# Patient Record
Sex: Female | Born: 1945
Health system: Southern US, Community
[De-identification: ages and names within clinical notes are randomized; demographics above are authoritative.]

## PROBLEM LIST (undated history)

## (undated) DIAGNOSIS — R011 Cardiac murmur, unspecified: Secondary | ICD-10-CM

## (undated) DIAGNOSIS — H269 Unspecified cataract: Secondary | ICD-10-CM

## (undated) DIAGNOSIS — R7303 Prediabetes: Secondary | ICD-10-CM

## (undated) DIAGNOSIS — I499 Cardiac arrhythmia, unspecified: Secondary | ICD-10-CM

## (undated) DIAGNOSIS — M1990 Unspecified osteoarthritis, unspecified site: Secondary | ICD-10-CM

## (undated) DIAGNOSIS — R7301 Impaired fasting glucose: Secondary | ICD-10-CM

## (undated) DIAGNOSIS — K5730 Diverticulosis of large intestine without perforation or abscess without bleeding: Secondary | ICD-10-CM

## (undated) DIAGNOSIS — E785 Hyperlipidemia, unspecified: Secondary | ICD-10-CM

## (undated) DIAGNOSIS — K635 Polyp of colon: Secondary | ICD-10-CM

## (undated) HISTORY — PX: ABDOMINAL HYSTERECTOMY: SHX81

## (undated) HISTORY — PX: OTHER SURGICAL HISTORY: SHX169

## (undated) HISTORY — DX: Diverticulosis of large intestine without perforation or abscess without bleeding: K57.30

## (undated) HISTORY — PX: CATARACT EXTRACTION, BILATERAL: SHX1313

## (undated) HISTORY — DX: Impaired fasting glucose: R73.01

## (undated) HISTORY — PX: CHOLECYSTECTOMY, LAPAROSCOPIC: SHX56

## (undated) HISTORY — PX: CHOLECYSTECTOMY: SHX55

## (undated) HISTORY — DX: Unspecified cataract: H26.9

## (undated) HISTORY — DX: Hyperlipidemia, unspecified: E78.5

## (undated) HISTORY — DX: Polyp of colon: K63.5

## (undated) HISTORY — PX: EYE SURGERY: SHX253

---

## 1999-06-03 ENCOUNTER — Other Ambulatory Visit: Admission: RE | Admit: 1999-06-03 | Discharge: 1999-06-03 | Payer: Self-pay | Admitting: *Deleted

## 2000-07-04 ENCOUNTER — Other Ambulatory Visit: Admission: RE | Admit: 2000-07-04 | Discharge: 2000-07-04 | Payer: Self-pay | Admitting: *Deleted

## 2000-11-07 ENCOUNTER — Ambulatory Visit (HOSPITAL_COMMUNITY): Admission: RE | Admit: 2000-11-07 | Discharge: 2000-11-07 | Payer: Self-pay | Admitting: Gastroenterology

## 2000-11-07 ENCOUNTER — Encounter (INDEPENDENT_AMBULATORY_CARE_PROVIDER_SITE_OTHER): Payer: Self-pay | Admitting: *Deleted

## 2001-08-15 ENCOUNTER — Other Ambulatory Visit: Admission: RE | Admit: 2001-08-15 | Discharge: 2001-08-15 | Payer: Self-pay | Admitting: *Deleted

## 2002-08-09 ENCOUNTER — Other Ambulatory Visit: Admission: RE | Admit: 2002-08-09 | Discharge: 2002-08-09 | Payer: Self-pay | Admitting: *Deleted

## 2003-08-13 ENCOUNTER — Other Ambulatory Visit: Admission: RE | Admit: 2003-08-13 | Discharge: 2003-08-13 | Payer: Self-pay | Admitting: *Deleted

## 2004-02-11 ENCOUNTER — Encounter: Payer: Self-pay | Admitting: Internal Medicine

## 2004-06-24 ENCOUNTER — Ambulatory Visit: Payer: Self-pay | Admitting: Internal Medicine

## 2004-10-21 ENCOUNTER — Other Ambulatory Visit: Admission: RE | Admit: 2004-10-21 | Discharge: 2004-10-21 | Payer: Self-pay | Admitting: *Deleted

## 2005-06-03 ENCOUNTER — Ambulatory Visit: Payer: Self-pay | Admitting: Internal Medicine

## 2005-11-04 ENCOUNTER — Other Ambulatory Visit: Admission: RE | Admit: 2005-11-04 | Discharge: 2005-11-04 | Payer: Self-pay | Admitting: *Deleted

## 2006-04-05 ENCOUNTER — Ambulatory Visit: Payer: Self-pay | Admitting: Internal Medicine

## 2006-09-26 ENCOUNTER — Ambulatory Visit: Payer: Self-pay | Admitting: Internal Medicine

## 2006-09-26 LAB — CONVERTED CEMR LAB
ALT: 15 units/L (ref 0–40)
Albumin: 3.6 g/dL (ref 3.5–5.2)
Basophils Absolute: 0 10*3/uL (ref 0.0–0.1)
Bilirubin, Direct: 0.1 mg/dL (ref 0.0–0.3)
Calcium: 9.2 mg/dL (ref 8.4–10.5)
Cholesterol: 145 mg/dL (ref 0–200)
Eosinophils Absolute: 0.2 10*3/uL (ref 0.0–0.6)
Eosinophils Relative: 2.8 % (ref 0.0–5.0)
GFR calc Af Amer: 94 mL/min
GFR calc non Af Amer: 78 mL/min
Glucose, Bld: 102 mg/dL — ABNORMAL HIGH (ref 70–99)
HDL: 44.3 mg/dL (ref >39.0)
Hgb A1c MFr Bld: 5.9 % (ref 4.6–6.0)
Lymphocytes Relative: 31.1 % (ref 12.0–46.0)
MCHC: 34.8 g/dL (ref 30.0–36.0)
MCV: 87.7 fL (ref 78.0–100.0)
Neutro Abs: 3.5 10*3/uL (ref 1.4–7.7)
Platelets: 244 10*3/uL (ref 150–400)
Sodium: 145 meq/L (ref 135–145)
Total CHOL/HDL Ratio: 3.3
Triglycerides: 162 mg/dL — ABNORMAL HIGH (ref 0–149)
WBC: 6.1 10*3/uL (ref 4.5–10.5)

## 2006-11-07 ENCOUNTER — Other Ambulatory Visit: Admission: RE | Admit: 2006-11-07 | Discharge: 2006-11-07 | Payer: Self-pay | Admitting: *Deleted

## 2006-11-11 ENCOUNTER — Encounter: Payer: Self-pay | Admitting: Internal Medicine

## 2007-08-28 ENCOUNTER — Encounter: Payer: Self-pay | Admitting: Internal Medicine

## 2007-08-30 ENCOUNTER — Encounter: Payer: Self-pay | Admitting: Internal Medicine

## 2007-10-03 ENCOUNTER — Telehealth (INDEPENDENT_AMBULATORY_CARE_PROVIDER_SITE_OTHER): Payer: Self-pay | Admitting: *Deleted

## 2007-10-16 ENCOUNTER — Ambulatory Visit: Payer: Self-pay | Admitting: Internal Medicine

## 2007-10-22 LAB — CONVERTED CEMR LAB
Cholesterol: 156 mg/dL (ref 0–200)
HDL: 42.3 mg/dL (ref >39.0)

## 2007-10-23 ENCOUNTER — Encounter (INDEPENDENT_AMBULATORY_CARE_PROVIDER_SITE_OTHER): Payer: Self-pay | Admitting: *Deleted

## 2007-11-07 ENCOUNTER — Ambulatory Visit: Payer: Self-pay | Admitting: Internal Medicine

## 2007-11-12 LAB — CONVERTED CEMR LAB
ALT: 20 units/L (ref 0–35)
AST: 21 units/L (ref 0–37)
Bilirubin, Direct: 0.1 mg/dL (ref 0.0–0.3)
Total Bilirubin: 0.6 mg/dL (ref 0.3–1.2)

## 2007-11-13 ENCOUNTER — Encounter (INDEPENDENT_AMBULATORY_CARE_PROVIDER_SITE_OTHER): Payer: Self-pay | Admitting: *Deleted

## 2008-02-20 ENCOUNTER — Ambulatory Visit: Payer: Self-pay | Admitting: Internal Medicine

## 2008-02-20 DIAGNOSIS — B379 Candidiasis, unspecified: Secondary | ICD-10-CM | POA: Insufficient documentation

## 2008-02-20 DIAGNOSIS — E8881 Metabolic syndrome: Secondary | ICD-10-CM

## 2008-02-20 DIAGNOSIS — E785 Hyperlipidemia, unspecified: Secondary | ICD-10-CM

## 2008-02-20 DIAGNOSIS — R739 Hyperglycemia, unspecified: Secondary | ICD-10-CM

## 2008-02-20 DIAGNOSIS — R498 Other voice and resonance disorders: Secondary | ICD-10-CM | POA: Insufficient documentation

## 2008-02-20 HISTORY — DX: Hyperglycemia, unspecified: R73.9

## 2008-02-20 HISTORY — DX: Hyperlipidemia, unspecified: E78.5

## 2008-02-23 ENCOUNTER — Telehealth (INDEPENDENT_AMBULATORY_CARE_PROVIDER_SITE_OTHER): Payer: Self-pay | Admitting: *Deleted

## 2008-02-23 ENCOUNTER — Ambulatory Visit: Payer: Self-pay | Admitting: Internal Medicine

## 2008-02-28 ENCOUNTER — Ambulatory Visit: Payer: Self-pay | Admitting: Internal Medicine

## 2008-02-29 ENCOUNTER — Encounter: Payer: Self-pay | Admitting: Internal Medicine

## 2008-03-28 ENCOUNTER — Encounter: Payer: Self-pay | Admitting: Internal Medicine

## 2008-03-28 LAB — HM COLONOSCOPY

## 2008-05-02 ENCOUNTER — Telehealth (INDEPENDENT_AMBULATORY_CARE_PROVIDER_SITE_OTHER): Payer: Self-pay | Admitting: *Deleted

## 2008-09-24 ENCOUNTER — Encounter: Payer: Self-pay | Admitting: Internal Medicine

## 2009-03-20 ENCOUNTER — Ambulatory Visit: Payer: Self-pay | Admitting: Internal Medicine

## 2009-03-25 ENCOUNTER — Ambulatory Visit: Payer: Self-pay | Admitting: Internal Medicine

## 2009-03-25 DIAGNOSIS — Z860100 Personal history of colon polyps, unspecified: Secondary | ICD-10-CM

## 2009-03-25 HISTORY — DX: Personal history of colon polyps, unspecified: Z86.0100

## 2009-03-25 HISTORY — DX: Personal history of colonic polyps: Z86.010

## 2009-03-28 LAB — CONVERTED CEMR LAB
AST: 20 units/L (ref 0–37)
Albumin: 3.9 g/dL (ref 3.5–5.2)
Alkaline Phosphatase: 60 units/L (ref 39–117)
Basophils Relative: 0.6 % (ref 0.0–3.0)
CO2: 31 meq/L (ref 19–32)
Calcium: 9.1 mg/dL (ref 8.4–10.5)
Eosinophils Relative: 3.7 % (ref 0.0–5.0)
GFR calc non Af Amer: 67.13 mL/min (ref >60)
HDL: 37.5 mg/dL — ABNORMAL LOW (ref >39.00)
Hemoglobin: 14.3 g/dL (ref 12.0–15.0)
LDL Cholesterol: 76 mg/dL (ref 0–99)
Lymphocytes Relative: 25.4 % (ref 12.0–46.0)
Monocytes Relative: 10.1 % (ref 3.0–12.0)
Neutro Abs: 4 10*3/uL (ref 1.4–7.7)
RBC: 4.67 M/uL (ref 3.87–5.11)
Sodium: 141 meq/L (ref 135–145)
Total CHOL/HDL Ratio: 4
Total Protein: 7.4 g/dL (ref 6.0–8.3)
VLDL: 33.8 mg/dL (ref 0.0–40.0)
WBC: 6.6 10*3/uL (ref 4.5–10.5)

## 2009-03-31 ENCOUNTER — Encounter (INDEPENDENT_AMBULATORY_CARE_PROVIDER_SITE_OTHER): Payer: Self-pay | Admitting: *Deleted

## 2009-06-04 ENCOUNTER — Telehealth (INDEPENDENT_AMBULATORY_CARE_PROVIDER_SITE_OTHER): Payer: Self-pay | Admitting: *Deleted

## 2009-06-06 ENCOUNTER — Ambulatory Visit: Payer: Self-pay | Admitting: Internal Medicine

## 2009-06-06 DIAGNOSIS — M79609 Pain in unspecified limb: Secondary | ICD-10-CM

## 2009-06-11 ENCOUNTER — Encounter (INDEPENDENT_AMBULATORY_CARE_PROVIDER_SITE_OTHER): Payer: Self-pay | Admitting: *Deleted

## 2009-06-18 ENCOUNTER — Ambulatory Visit: Payer: Self-pay | Admitting: Sports Medicine

## 2009-06-18 DIAGNOSIS — M216X9 Other acquired deformities of unspecified foot: Secondary | ICD-10-CM

## 2009-06-18 DIAGNOSIS — M722 Plantar fascial fibromatosis: Secondary | ICD-10-CM

## 2009-06-18 HISTORY — DX: Plantar fascial fibromatosis: M72.2

## 2009-07-16 ENCOUNTER — Ambulatory Visit: Payer: Self-pay | Admitting: Sports Medicine

## 2009-09-10 ENCOUNTER — Ambulatory Visit: Payer: Self-pay | Admitting: Sports Medicine

## 2009-09-29 ENCOUNTER — Ambulatory Visit: Payer: Self-pay | Admitting: Internal Medicine

## 2009-09-29 ENCOUNTER — Telehealth (INDEPENDENT_AMBULATORY_CARE_PROVIDER_SITE_OTHER): Payer: Self-pay | Admitting: *Deleted

## 2009-10-02 ENCOUNTER — Encounter: Payer: Self-pay | Admitting: Internal Medicine

## 2009-10-03 LAB — CONVERTED CEMR LAB
ALT: 16 units/L (ref 0–35)
AST: 18 units/L (ref 0–37)
Cholesterol: 129 mg/dL (ref 0–200)
Total CHOL/HDL Ratio: 3
Total Protein: 7.2 g/dL (ref 6.0–8.3)
Triglycerides: 174 mg/dL — ABNORMAL HIGH (ref 0.0–149.0)

## 2009-11-10 ENCOUNTER — Ambulatory Visit: Payer: Self-pay | Admitting: Sports Medicine

## 2009-12-30 ENCOUNTER — Ambulatory Visit: Payer: Self-pay | Admitting: Sports Medicine

## 2010-02-04 ENCOUNTER — Ambulatory Visit: Payer: Self-pay | Admitting: Internal Medicine

## 2010-02-13 LAB — CONVERTED CEMR LAB
AST: 17 units/L (ref 0–37)
Albumin: 3.7 g/dL (ref 3.5–5.2)
Cholesterol: 149 mg/dL (ref 0–200)
HDL: 45.8 mg/dL (ref >39.00)
Hgb A1c MFr Bld: 6.3 % (ref 4.6–6.5)
Total CHOL/HDL Ratio: 3
Total Protein: 6.9 g/dL (ref 6.0–8.3)
Triglycerides: 162 mg/dL — ABNORMAL HIGH (ref 0.0–149.0)

## 2010-04-01 ENCOUNTER — Ambulatory Visit: Payer: Self-pay | Admitting: Sports Medicine

## 2010-04-10 ENCOUNTER — Ambulatory Visit: Payer: Self-pay | Admitting: Internal Medicine

## 2010-04-10 DIAGNOSIS — R21 Rash and other nonspecific skin eruption: Secondary | ICD-10-CM | POA: Insufficient documentation

## 2010-04-10 DIAGNOSIS — R030 Elevated blood-pressure reading, without diagnosis of hypertension: Secondary | ICD-10-CM | POA: Insufficient documentation

## 2010-04-10 HISTORY — DX: Elevated blood-pressure reading, without diagnosis of hypertension: R03.0

## 2010-06-19 ENCOUNTER — Ambulatory Visit: Payer: Self-pay | Admitting: Internal Medicine

## 2010-06-19 LAB — CONVERTED CEMR LAB
OCCULT 1: NEGATIVE
OCCULT 2: NEGATIVE

## 2010-06-22 ENCOUNTER — Encounter (INDEPENDENT_AMBULATORY_CARE_PROVIDER_SITE_OTHER): Payer: Self-pay | Admitting: *Deleted

## 2010-07-13 ENCOUNTER — Ambulatory Visit: Payer: Self-pay | Admitting: Internal Medicine

## 2010-07-13 DIAGNOSIS — I8390 Asymptomatic varicose veins of unspecified lower extremity: Secondary | ICD-10-CM

## 2010-07-13 DIAGNOSIS — I8393 Asymptomatic varicose veins of bilateral lower extremities: Secondary | ICD-10-CM

## 2010-07-13 DIAGNOSIS — R062 Wheezing: Secondary | ICD-10-CM

## 2010-07-13 HISTORY — DX: Asymptomatic varicose veins of bilateral lower extremities: I83.93

## 2010-08-30 LAB — CONVERTED CEMR LAB
ALT: 18 units/L (ref 0–35)
BUN: 25 mg/dL — ABNORMAL HIGH (ref 6–23)
Basophils Absolute: 0 10*3/uL (ref 0.0–0.1)
Basophils Relative: 0.5 % (ref 0.0–3.0)
Bilirubin, Direct: 0.1 mg/dL (ref 0.0–0.3)
CO2: 28 meq/L (ref 19–32)
CO2: 28 meq/L (ref 19–32)
Calcium: 9.2 mg/dL (ref 8.4–10.5)
Calcium: 9.3 mg/dL (ref 8.4–10.5)
Chloride: 107 meq/L (ref 96–112)
Creatinine, Ser: 0.8 mg/dL (ref 0.4–1.2)
Direct LDL: 85.6 mg/dL
Eosinophils Absolute: 0.2 10*3/uL (ref 0.0–0.7)
Glucose, Bld: 104 mg/dL — ABNORMAL HIGH (ref 70–99)
Glucose, Bld: 88 mg/dL (ref 70–99)
HDL goal, serum: 40 mg/dL
Hemoglobin: 15 g/dL (ref 12.0–15.0)
Lymphocytes Relative: 24.9 % (ref 12.0–46.0)
Lymphocytes Relative: 25.4 % (ref 12.0–46.0)
MCHC: 34.3 g/dL (ref 30.0–36.0)
MCV: 90.4 fL (ref 78.0–100.0)
Monocytes Absolute: 0.7 10*3/uL (ref 0.1–1.0)
Monocytes Relative: 7.9 % (ref 3.0–12.0)
Neutro Abs: 4.4 10*3/uL (ref 1.4–7.7)
Neutrophils Relative %: 61.5 % (ref 43.0–77.0)
Platelets: 236 10*3/uL (ref 150.0–400.0)
RBC: 4.5 M/uL (ref 3.87–5.11)
RBC: 4.73 M/uL (ref 3.87–5.11)
RDW: 14.1 % (ref 11.5–14.6)
Sodium: 140 meq/L (ref 135–145)
TSH: 2.93 microintl units/mL (ref 0.35–5.50)
Total CHOL/HDL Ratio: 4.1
Total Protein: 7.2 g/dL (ref 6.0–8.3)
VLDL: 42 mg/dL — ABNORMAL HIGH (ref 0–40)
WBC: 7 10*3/uL (ref 4.5–10.5)

## 2010-09-03 ENCOUNTER — Telehealth (INDEPENDENT_AMBULATORY_CARE_PROVIDER_SITE_OTHER): Payer: Self-pay | Admitting: *Deleted

## 2010-09-03 NOTE — Assessment & Plan Note (Signed)
Summary: cpx / lab/cbs   Vital Signs:  Patient profile:   65 year old female Height:      73.75 inches Weight:      273.8 pounds BMI:     35.52 Temp:     98.9 degrees F oral Pulse rate:   60 / minute Resp:     14 per minute BP sitting:   138 / 90  (left arm) Cuff size:   large  Vitals Entered By: Shonna Chock CMA (April 10, 2010 9:54 AM) CC: CPX, fasting if labs needed, Lipid Management   CC:  CPX, fasting if labs needed, and Lipid Management.  History of Present Illness: Morgan Huff is here for a physical; she has had a rash on R foot X 3 months.Tinactin of no benefit. She sees Dr Margo Aye for skin screening due to FH of melanoma. Hyperlipidemia Follow-Up      This is a 65 year old woman who presents for Hyperlipidemia follow-up.  The patient denies muscle aches, GI upset, abdominal pain, flushing, itching, constipation, diarrhea, and fatigue.  Other symptoms include palpitations.  The patient denies the following symptoms: chest pain/pressure, exercise intolerance, dypsnea, syncope, and pedal edema.  Compliance with medications (by patient report) has been near 100%.  Dietary compliance has been fair.  The patient reports no exercise due to Plantar Fasciitis which is resolving.  Adjunctive measures currently used by the patient include fish oil supplements.    Lipid Management History:      Positive NCEP/ATP III risk factors include female age 96 years old or older.  Negative NCEP/ATP III risk factors include non-diabetic, no family history for ischemic heart disease, non-tobacco-user status, non-hypertensive, no ASHD (atherosclerotic heart disease), no prior stroke/TIA, no peripheral vascular disease, and no history of aortic aneurysm.     Allergies (verified): No Known Drug Allergies  Past History:  Past Medical History: Fasting hyperglycemia: A1c 6.3% in 01/2010 glaucoma, Dr Lyndon [facility] Colonic polyps,PMH  of Hyperlipidemia: [place] Study LDL goal = < 160.(note:  goal = < 100 if DM present). NMR Lipoprofile 2005: total particle # [id number], small dense 1106 on Lipitor.   Past Surgical History: colonoscopy: polyps,tics in 2002 & 2006; 2010: lipoma, Dr Vernia Buff Magod(repeat 2014) cholecystectomy 12/2004-right trabelectomy for glaucoma 03/2006- cataract surgery , lens transplant OD  Family History: Father: ? hemachromatosis,MI died 32, hypokalemia,leukemia,glaucoma Mother: lung cancer  Siblings: brother: melanoma MGF:CAD in 57's, ?DM; MGM: breast cancer  Social History: No diet Never Smoked Alcohol use-yes: socially Regular exercise-no(she plans  to start now that fasciitis resolved ) Retired  Review of Systems       The patient complains of hoarseness.  The patient denies anorexia, fever, weight loss, weight gain, vision loss, decreased hearing, prolonged cough, headaches, hemoptysis, melena, hematochezia, severe indigestion/heartburn, hematuria, depression, unusual weight change, abnormal bleeding, enlarged lymph nodes, and angioedema.         Minor incontinence due to prolapse.  Physical Exam  General:  well-nourished;alert,appropriate and cooperative throughout examination Head:  Normocephalic and atraumatic without obvious abnormalities. Eyes:  No corneal or conjunctival inflammation noted.  Perrla. Funduscopic exam benign, without hemorrhages, exudates or papilledema.  Ears:  External ear exam shows no significant lesions or deformities.  Otoscopic examination reveals clear canals, tympanic membranes are intact bilaterally without bulging, retraction, inflammation or discharge. Hearing is grossly normal bilaterally. Nose:  External nasal examination shows no deformity or inflammation. Nasal mucosa are pink and moist without lesions or exudates. Mouth:  Oral mucosa and oropharynx without lesions  or exudates.  Teeth in good repair.Small osteoma hard palate Neck:  No deformities, masses, or tenderness noted. Lungs:  Normal respiratory effort, chest  expands symmetrically. Lungs are clear to auscultation, no crackles or wheezes. Heart:  Normal rate and regular rhythm. S1 and S2 normal without gallop, murmur, click, rub .S4 Abdomen:  Bowel sounds positive,abdomen soft and non-tender without masses, organomegaly or hernias noted. Genitalia:  Dr. Rana Snare Msk:  No deformity or scoliosis noted of thoracic or lumbar spine.   Pulses:  R and L carotid,radial,dorsalis pedis and posterior tibial pulses are full and equal bilaterally Extremities:  No clubbing, cyanosis, edema.Crepitus of knees Neurologic:  alert & oriented X3 and DTRs symmetrical and normal.   Skin:  2.5X 2.5 cm rash R dorsal  foot Cervical Nodes:  No lymphadenopathy noted Axillary Nodes:  No palpable lymphadenopathy Psych:  memory intact for recent and remote, normally interactive, and good eye contact.     Impression & Recommendations:  Problem # 1:  ROUTINE GENERAL MEDICAL EXAM@HEALTH  CARE FACL (ICD-V70.0)  Orders: EKG w/ Interpretation (93000) Venipuncture (16109) TLB-BMP (Basic Metabolic Panel-BMET) (80048-METABOL) TLB-CBC Platelet - w/Differential (85025-CBCD) TLB-TSH (Thyroid Stimulating Hormone) (84443-TSH)  Problem # 2:  HYPERLIPIDEMIA (ICD-272.4)  Her updated medication list for this problem includes:    Crestor 10 Mg Tabs (Rosuvastatin calcium) .Marland Kitchen... Take one tablet at bedtime  Problem # 3:  HYPERGLYCEMIA (ICD-790.29)  Problem # 4:  RASH-NONVESICULAR (ICD-782.1)  Problem # 5:  ELEVATED BLOOD PRESSURE WITHOUT DIAGNOSIS OF HYPERTENSION (ICD-796.2)  Orders: EKG w/ Interpretation (93000)  Problem # 6:  OTHER VOICE DISTURBANCE (ICD-784.49)  Complete Medication List: 1)  Cosopt 2-0.5 % Soln (Dorzolamide-timolol) .Marland Kitchen.. 1 gtt in letf eye two times a day 2)  Fish Oil 1200 Mg Caps (Omega-3 fatty acids) .Marland Kitchen.. 1 by mouth once daily 3)  Vitamin D 1000 Unit Tabs (Cholecalciferol) .Marland Kitchen.. 1 by mouth once daily 4)  Vitamin C 1000 Mg Tabs (Ascorbic acid) .Marland Kitchen.. 1 by mouth once  daily 5)  Centrum Silver Tabs (Multiple vitamins-minerals) .Marland Kitchen.. 1 by mouth once daily 6)  Tums Ultra 1000 1000 Mg Chew (Calcium carbonate antacid) .... 2 by mouth once daily 7)  Advil 200 Mg Tabs (Ibuprofen) .... 2 by mouth once daily 8)  Crestor 10 Mg Tabs (Rosuvastatin calcium) .... Take one tablet at bedtime 9)  Tramadol Hcl 50 Mg Tabs (Tramadol hcl) .... Take one tab q6 hours as needed pain dispense #120 for 90 days 10)  Voltaren 1 % Gel (Diclofenac sodium) .... Rub 1 gram into affected area qid  Lipid Assessment/Plan:      Based on NCEP/ATP III, the patient's risk factor category is "0-1 risk factors".  The patient's lipid goals are as follows: Total cholesterol goal is 200; LDL cholesterol goal is 160; HDL cholesterol goal is 40; Triglyceride goal is 150.    Patient Instructions: 1)  Consume LESS THAN 40 grams of sugar / day from foods & drinks with High Fructose Corn Syrup as #2 ,3 or 4 on label.  2)  Please schedule a follow-up appointment in 3 months. 3)  HbgA1C prior to visit, ICD-9:790.29 4)  Urine Microalbumin prior to visit, ICD-9:790.29 5)  Check your Blood Pressure regularly. If it is above: 135/85 ON AVERAGE  you should make an appointment. Use Nizoral cream two times a day & blow dry. Prescriptions: CRESTOR 10 MG TABS (ROSUVASTATIN CALCIUM) take one tablet at bedtime  #90 Tablet x 3   Entered and Authorized by:   Marga Melnick MD  Signed by:   Marga Melnick MD on 04/10/2010   Method used:   Print then Give to Patient   RxID:   [id number]     Appended Document: cpx / lab/cbs

## 2010-09-03 NOTE — Assessment & Plan Note (Signed)
Summary: U/S FOOT,MC   Vital Signs:  Patient profile:   65 year old female BP sitting:   125 / 84  Vitals Entered By: Lillia Pauls CMA (Dec 30, 2009 11:26 AM)  History of Present Illness: Lt fascia 0.85 thick when scanned in early Dec  now feeling better use tramadol if out longer period of time and this helps with pain also uses volt gel daily in AM and PM and sometimes during day  not much morning pain now - just a few minutes  [place] feels OK  most days only light pain but has not started her walking program back at this point whe was able to get through long hours during play production without this getting worse  Allergies: No Known Drug Allergies  Physical Exam  General:  Obese,well-nourished,in no acute distress; alert,appropriate and cooperative throughout examination Msk:  left AT is non tender and no swelling  left PF insertion is not painful on deep pressure good movement of first toe standing shows collapse of long arch  MSK [place] This is repeated and compared to scan of Dec the left PF is less hypoechoic there is still significant thickening though PF is 0.78 thick vs 0.59 on RT AT looks normal  images saved   Impression & Recommendations:  Problem # 1:  HEEL PAIN, LEFT (ICD-729.5) This is improved and we will cont with top voltaren for milder pain use tramadol for longer days  Problem # 2:  PLANTAR FASCIITIS, LEFT (ICD-728.71)  Her updated medication list for this problem includes:    Advil 200 Mg Tabs (Ibuprofen) .Marland Kitchen... 2 by mouth once daily  keep up exercises, stretches and icing as all seem to help  will reck in 3 mos  may want to repeat US at that point - unclear how long will take for this to normalize and patient advised  Complete Medication List: 1)  Cosopt 2-0.5 % Soln (Dorzolamide-timolol) .Marland Kitchen.. 1 gtt in letf eye two times a day 2)  Fish Oil 1200 Mg Caps (Omega-3 fatty acids) .Marland Kitchen.. 1 by mouth once daily 3)  Vit-d 800mg   .... 1 by  mouth once daily 4)  Vitamin C 100 Mg Tabs (Ascorbic acid) .Marland Kitchen.. 1 by mouth once daily 5)  Centrum Silver Tabs (Multiple vitamins-minerals) .Marland Kitchen.. 1 by mouth once daily 6)  Tums Ultra 1000 1000 Mg Chew (Calcium carbonate antacid) .... 2 by mouth once daily 7)  Advil 200 Mg Tabs (Ibuprofen) .... 2 by mouth once daily 8)  Crestor 10 Mg Tabs (Rosuvastatin calcium) .... Take one tablet at bedtime 9)  Tramadol Hcl 50 Mg Tabs (Tramadol hcl) .... Take one tab q6 hours as needed pain dispense #120 for 90 days 10)  Voltaren 1 % Gel (Diclofenac sodium) .... Rub 1 gram into affected area qid  Appended Document: U/S FOOT,MC Note  Screening US was billed on last visit but actually completed on this visit. We did not rebill [place] today becuase of that.  These images are saved.  The prior image report probably underestimated the thickness of PF. These images are compared to ones from first visit and show some clear improvment but persistent thickening of left PF.

## 2010-09-03 NOTE — Letter (Signed)
Summary: Results Follow up Letter  [facility]  [address] [place], [place] [address]   Phone: [phone]  Fax: [phone]    06/22/2010 MRN: [id number]  [facility] [address] [place], [place]  [address]  Dear Ms. Wynn,  The following are the results of your recent test(s):  Test         Result    Pap Smear:        Normal _____  Not Normal _____ Comments: ______________________________________________________ Cholesterol: LDL(Bad cholesterol):         Your goal is less than:         HDL (Good cholesterol):       Your goal is more than: Comments:  ______________________________________________________ Mammogram:        Normal _____  Not Normal _____ Comments:  ___________________________________________________________________ Hemoccult:        Normal __X__  Not normal _______ Comments:    _____________________________________________________________________ Other Tests:    We routinely do not discuss normal results over the telephone.  If you desire a copy of the results, or you have any questions about this information we can discuss them at your next office visit.   Sincerely,

## 2010-09-03 NOTE — Assessment & Plan Note (Signed)
Summary: F/U,MC   Vital Signs:  Patient profile:   65 year old female BP sitting:   136 / 87  Vitals Entered By: Lillia Pauls CMA (April 01, 2010 11:30 AM)  History of Present Illness: Here for f/u PF.  Overall doing much better.  Has been doing her stretches.  No longer icing.  Now can be on feet for several hours before pain sets in.  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 Can still feel a little swelling/stiffness/tightness, but really no pain.  Can't walk without shoes. Has previously received orthotics, working well.  Allergies: No Known Drug Allergies  Review of Systems  The patient denies anorexia, fever, weight loss, weight gain, vision loss, decreased hearing, hoarseness, chest pain, syncope, dyspnea on exertion, peripheral edema, prolonged cough, headaches, hemoptysis, abdominal pain, melena, hematochezia, severe indigestion/heartburn, hematuria, incontinence, genital sores, muscle weakness, suspicious skin lesions, transient blindness, difficulty walking, depression, unusual weight change, abnormal bleeding, enlarged lymph nodes, angioedema, breast masses, and testicular masses.    Physical Exam  General:  Well-developed,well-nourished,in no acute distress; alert,appropriate and cooperative throughout examination   Foot/Ankle Exam  Foot Exam:    L foot - no ttp over PF insertion.  Neg squeeze.  Ankle ROM nl and stable.  Increase stretch of PF past 90 deg with knee in full ext. Neg Thompson.  Limited US L heel - Long view demonstrates mild calcification of PF insertion on calc, but overall less edema.  AP diameter 0.69 cm (dec from 0.79 cm)   Impression & Recommendations:  Problem # 1:  PLANTAR FASCIITIS, LEFT (ICD-728.71) Assessment Improved  Greatly improved, with pain generally only when she walks barefoot - continue wearing orhotics - continue daily stretches and rubs - f/u prn Her updated medication list for this problem includes:    Advil 200 Mg  Tabs (Ibuprofen) .Marland Kitchen... 2 by mouth once daily  Orders: [place] LIMITED (45409)  Complete Medication List: 1)  Cosopt 2-0.5 % Soln (Dorzolamide-timolol) .Marland Kitchen.. 1 gtt in letf eye two times a day 2)  Fish Oil 1200 Mg Caps (Omega-3 fatty acids) .Marland Kitchen.. 1 by mouth once daily 3)  Vit-d 800mg   .... 1 by mouth once daily 4)  Vitamin C 100 Mg Tabs (Ascorbic acid) .Marland Kitchen.. 1 by mouth once daily 5)  Centrum Silver Tabs (Multiple vitamins-minerals) .Marland Kitchen.. 1 by mouth once daily 6)  Tums Ultra 1000 1000 Mg Chew (Calcium carbonate antacid) .... 2 by mouth once daily 7)  Advil 200 Mg Tabs (Ibuprofen) .... 2 by mouth once daily 8)  Crestor 10 Mg Tabs (Rosuvastatin calcium) .... Take one tablet at bedtime 9)  Tramadol Hcl 50 Mg Tabs (Tramadol hcl) .... Take one tab q6 hours as needed pain dispense #120 for 90 days 10)  Voltaren 1 % Gel (Diclofenac sodium) .... Rub 1 gram into affected area qid

## 2010-09-03 NOTE — Progress Notes (Signed)
Summary: NEED CRESTOR PRESCRIPTION BASED ON LABS  Phone Note Call from Patient Call back at Home Phone [phone]   Caller: Patient Summary of Call: PATIENT CAME IN TODAY FOR LAB WORK--SAYS SHE WANTS RESULTS OF LAB WORK SENT TO HER  ALSO GAVE ME A [employer] FORM FOR CRESTOR 20 MG---SHE SAID THAT DR HOPPER NEEDS TO REVIEW LABS AND THEN APPROVE THIS PRESCRIPTION FOR CRESTOR STRENGTH;   IF HE IS CHANGING STRENGTH, PLEASE CALL PATIENT, THEN CONTACT [facility] WITH NEW CRESTOR STRENGTH  WILL TAKE [employer] FORM BACK TO [facility]S DESK IN [employer] Initial call taken by: Jerolyn Shin,  September 29, 2009 3:02 PM  Follow-up for Phone Call        Noted, will futher address when lab results come in Follow-up by: Shonna Chock,  September 30, 2009 9:31 AM  Additional Follow-up for Phone Call Additional follow up Details #1::        Crestor needs to be decreased to 10 mg once daily  #90,RX1. Fasting lipids , hepatic panel , A1c after 4 months (272.4,995.20) with appt 2-3 days later. [place] or Sugar Buster's low carb diet. Additional Follow-up by: Marga Melnick MD,  October 02, 2009 6:13 PM    Additional Follow-up for Phone Call Additional follow up Details #2::    spoke w/ patient aware of labs and to decrease medication .......Marland KitchenDoristine Devoid  October 03, 2009 3:40 PM   New/Updated Medications: CRESTOR 10 MG TABS (ROSUVASTATIN CALCIUM) take one tablet at bedtime Prescriptions: CRESTOR 10 MG TABS (ROSUVASTATIN CALCIUM) take one tablet at bedtime  #90 x 0   Entered by:   Doristine Devoid   Authorized by:   Marga Melnick MD   Signed by:   Doristine Devoid on 10/03/2009   Method used:   Electronically to        [facility]* (mail-order)             ,          Ph: [phone]       Fax: [phone]   RxID:   [id number]

## 2010-09-03 NOTE — Assessment & Plan Note (Signed)
Summary: 3 MONTH FOLLOWUP///SPH   Vital Signs:  Patient profile:   65 year old female Weight:      273.4 pounds BMI:     35.47 Temp:     98.4 degrees F oral Pulse rate:   72 / minute Resp:     15 per minute BP sitting:   136 / 88  (left arm) Cuff size:   large  Vitals Entered By: Shonna Chock CMA (July 13, 2010 2:51 PM) CC: 3 month follow-up on B/P, fasting if any labs due, COPD follow-up   CC:  3 month follow-up on B/P, fasting if any labs due, and COPD follow-up.  History of Present Illness:      This is a 65 year old woman who presents for prior elevated BP w/o diagnosis of  Hypertension follow-up.  The patient reports urinary frequency, but denies lightheadedness, headaches, and fatigue.  Associated symptoms include exercise intolerance and dyspnea.  The patient denies the following associated symptoms: chest pain, chest pressure, palpitations, syncope, leg edema, and pedal edema.  The patient reports exercising occasionally.  Adjunctive measures currently used by the patient include salt restriction.  BP @ home 116/67- 148/92; records reviewed.    The patient reports intermittent  wheezing for years , ? related to Glaucoma meds.Her Ophth med has been changed by Dr Lottie Dawson, [facility]ly[facility] Savoy, but  not started as yet. "I want to finish the old one ; the copay is $50.".Wheezing usually  occurs  @ bedtime after drops instilled, it resolves within 30 min. She   denies cough, increased sputum, heat intolerance, and nocturnal awakening.  No current treatment for this; no PMH of asthma or smoking ( except second hand exposure growing up )  Current Medications (verified): 1)  Cosopt 2-0.5 %  Soln (Dorzolamide-Timolol) .Marland Kitchen.. 1 Gtt in Letf Eye Two Times A Day 2)  Fish Oil 1200 Mg  Caps (Omega-3 Fatty Acids) .Marland Kitchen.. 1 By Mouth Once Daily 3)  Vitamin D 1000 Unit Tabs (Cholecalciferol) .Marland Kitchen.. 1 By Mouth Once Daily 4)  Vitamin C 1000 Mg Tabs (Ascorbic Acid) .Marland Kitchen.. 1 By Mouth Once Daily 5)  Centrum Silver   Tabs  (Multiple Vitamins-Minerals) .Marland Kitchen.. 1 By Mouth Once Daily 6)  Tums Ultra 1000 1000 Mg  Chew (Calcium Carbonate Antacid) .... 2 By Mouth Once Daily 7)  Advil 200 Mg  Tabs (Ibuprofen) .... 2 By Mouth Once Daily 8)  Crestor 10 Mg Tabs (Rosuvastatin Calcium) .... Take One Tablet At Bedtime 9)  Tramadol Hcl 50 Mg Tabs (Tramadol Hcl) .... Take One Tab Q6 Hours As Needed Pain Dispense #120 For 90 Days 10)  Voltaren 1 % Gel (Diclofenac Sodium) .... Rub 1 Gram Into Affected Area Qid  Allergies (verified): No Known Drug Allergies  Review of Systems Allergy:  Denies itching eyes, seasonal allergies, and sneezing.  Physical Exam  General:  well-nourished,in no acute distress; alert,appropriate and cooperative throughout examination Lungs:  Normal respiratory effort, chest expands symmetrically. Lungs : isolated low grade  wheezes. Heart:  Normal rate and regular rhythm. S1 and S2 normal without gallop, murmur, click, rub or other extra sounds. Pulses:  R and L carotid,radial,dorsalis pedis and posterior tibial pulses are full and equal bilaterally Extremities:  No clubbing, cyanosis, edema. Venous spiders ; large varicosity LLE  Neurologic:  alert & oriented X3.   Skin:  Plethora of feet ;post  scarring R inferior shin    Impression & Recommendations:  Problem # 1:  ELEVATED BLOOD PRESSURE WITHOUT DIAGNOSIS OF HYPERTENSION (  ICD-796.2) BP control is good based on home records  Problem # 2:  WHEEZING (ICD-786.07) ? due to Glaucoma meds  Problem # 3:  HYPERLIPIDEMIA (ICD-272.4) LDL @ goal  Her updated medication list for this problem includes:    Crestor 10 Mg Tabs (Rosuvastatin calcium) .Marland Kitchen... Take one tablet at bedtime  Problem # 4:  VARICOSE VEINS, LOWER EXTREMITIES, MILD (ICD-454.9) support hose Rxed  Complete Medication List: 1)  Cosopt 2-0.5 % Soln (Dorzolamide-timolol) .Marland Kitchen.. 1 gtt in letf eye two times a day 2)  Fish Oil 1200 Mg Caps (Omega-3 fatty acids) .Marland Kitchen.. 1 by mouth once daily 3)   Vitamin D 1000 Unit Tabs (Cholecalciferol) .Marland Kitchen.. 1 by mouth once daily 4)  Vitamin C 1000 Mg Tabs (Ascorbic acid) .Marland Kitchen.. 1 by mouth once daily 5)  Centrum Silver Tabs (Multiple vitamins-minerals) .Marland Kitchen.. 1 by mouth once daily 6)  Tums Ultra 1000 1000 Mg Chew (Calcium carbonate antacid) .... 2 by mouth once daily 7)  Advil 200 Mg Tabs (Ibuprofen) .... 2 by mouth once daily 8)  Crestor 10 Mg Tabs (Rosuvastatin calcium) .... Take one tablet at bedtime 9)  Tramadol Hcl 50 Mg Tabs (Tramadol hcl) .... Take one tab q6 hours as needed pain dispense #120 for 90 days 10)  Voltaren 1 % Gel (Diclofenac sodium) .... Rub 1 gram into affected area qid  Patient Instructions: 1)  Start the new Ophth med if wheezing progresses. PFTs & CXray if wheezing fails to resolve with new med.Check your Blood Pressure regularly. If it is above: 135/85 ON AVERAGE you should make an appointment.Please make the next fasting Lipid Panel a [place] Heart Panel,1304X( Codes : 272.4,V17.3). The Crestor can be stopped 8-10 weeks before that study.   Orders Added: 1)  Est. Patient Level IV [08657]

## 2010-09-03 NOTE — Assessment & Plan Note (Signed)
Summary: F/U,MC   Vital Signs:  Patient profile:   65 year old female BP sitting:   133 / 86  Vitals Entered By: Lillia Pauls CMA (November 10, 2009 12:11 PM)  History of Present Illness: Pt here today to f/u L heel pain and plantar fasciitis. Pt sts she only feels pain in the area when she stands for long periods, have been sitting for long periods, or walking. Pt still taking the voltaren gel and tramadol, which i have refilled.  tramadol definitely helps volt gel helps in AM and unless severe  does stretches daily does some of exercses on book  states that she feels this is 40% improved and more when not standing  Allergies: No Known Drug Allergies  Physical Exam  General:  Well-developed,well-nourished,in no acute distress; alert,appropriate and cooperative throughout examination Msk:  mild swelling at medial border of PF insertion into calcaneus this is mod tender good toe motion AT is non tender  MSK [place] This is overall improved in appearance now the thickness is 0.65 which is less hypoechoic areas less some calcification where healing  images saved   Impression & Recommendations:  Problem # 1:  HEEL PAIN, LEFT (ICD-729.5)  This is less but needs to continue meds   use top volt when working  tramadol as needed otherwise  Orders: [place] LIMITED (46962)  Problem # 2:  PLANTAR FASCIITIS, LEFT (ICD-728.71)  Her updated medication list for this problem includes:    Advil 200 Mg Tabs (Ibuprofen) .Marland Kitchen... 2 by mouth once daily  Orders: Garment,belt,sleeve or other covering ,elastic or similar stretch (X5284) [place] LIMITED (13244)  keep up stretches, ice and modified eercise  try heels with good support if she wants to get out of runnign shoes at tiems  reck 2 mos and repeat scan again  Complete Medication List: 1)  Cosopt 2-0.5 % Soln (Dorzolamide-timolol) .Marland Kitchen.. 1 gtt in letf eye two times a day 2)  Fish Oil 1200 Mg Caps (Omega-3 fatty acids) .Marland Kitchen.. 1 by mouth once  daily 3)  Vit-d 800mg   .... 1 by mouth once daily 4)  Vitamin C 100 Mg Tabs (Ascorbic acid) .Marland Kitchen.. 1 by mouth once daily 5)  Centrum Silver Tabs (Multiple vitamins-minerals) .Marland Kitchen.. 1 by mouth once daily 6)  Tums Ultra 1000 1000 Mg Chew (Calcium carbonate antacid) .... 2 by mouth once daily 7)  Advil 200 Mg Tabs (Ibuprofen) .... 2 by mouth once daily 8)  Crestor 10 Mg Tabs (Rosuvastatin calcium) .... Take one tablet at bedtime 9)  Tramadol Hcl 50 Mg Tabs (Tramadol hcl) .... Take one tab q6 hours as needed pain dispense #120 for 90 days 10)  Voltaren 1 % Gel (Diclofenac sodium) .... Rub 1 gram into affected area qid Prescriptions: TRAMADOL HCL 50 MG TABS (TRAMADOL HCL) take one tab q6 hours as needed pain dispense #120 for 90 days  #120 x 2   Entered by:   Lillia Pauls CMA   Authorized by:   Enid Baas MD   Signed by:   Lillia Pauls CMA on 11/10/2009   Method used:   Electronically to        [facility]* (mail-order)             ,          Ph: [phone]       Fax: [phone]   RxID:   [id number] TRAMADOL HCL 50 MG TABS (TRAMADOL HCL) take one tab q6 hours as needed pain  #360 x  2   Entered by:   Lillia Pauls CMA   Authorized by:   Enid Baas MD   Signed by:   Lillia Pauls CMA on 11/10/2009   Method used:   Electronically to        [facility]* (mail-order)             ,          Ph: [phone]       Fax: [phone]   RxID:   [id number] TRAMADOL HCL 50 MG TABS (TRAMADOL HCL) 1 q 6 hrs as needed pain  #180 x 2   Entered and Authorized by:   Enid Baas MD   Signed by:   Enid Baas MD on 11/10/2009   Method used:   Electronically to        [facility]* (mail-order)             ,          Ph: [phone]       Fax: [phone]   RxID:   [id number] TRAMADOL HCL 50 MG TABS (TRAMADOL HCL) 1 q 6 hrs as needed pain  #90 x 2   Entered and Authorized by:   Enid Baas MD   Signed by:   Enid Baas MD on 11/10/2009   Method used:   Electronically to         [facility]* (mail-order)             ,          Ph: [phone]       Fax: [phone]   RxID:   [id number]

## 2010-09-03 NOTE — Assessment & Plan Note (Signed)
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  her pain pattern is not first in morning but actually increases as day goes longer pain most days less than 3/10 when she does use tramadol gets a lot of pain relief has only used it 2 or 3 times  using orthotics and doing exercises only using arch strap for workouts  Allergies: No Known Drug Allergies  Physical Exam  General:  Well-developed,well-nourished,in no acute distress; alert,appropriate and cooperative throughout examination Msk:  Left foot exam unchanged there is still only mild tenderness at heel flattening of arch hallux rigidus in great toe   Impression & Recommendations:  Problem # 1:  HEEL PAIN, LEFT (ICD-729.5) this is somewhat better but still problematic  encouraged to use more tramadol try to stay two times a day for at least a few weeks and see if she can get ahead of this would not inject as she does not have moring pain or that much tenderness prob element of arch srain along with PF  Problem # 2:  PLANTAR FASCIITIS, LEFT (ICD-728.71)  Her updated medication list for this problem includes:    Advil 200 Mg Tabs (Ibuprofen) .Marland Kitchen... 2 by mouth once daily  clearly has PF by [place] scan keep up stretches exercises when not fatugued wear arch wrap during day use orthotics  return 2 mos and rescan  will take time but going right direction  Complete Medication List: 1)  Cosopt 2-0.5 % Soln (Dorzolamide-timolol) .Marland Kitchen.. 1 gtt in letf eye two times a day 2)  Fish Oil 1200 Mg Caps (Omega-3 fatty acids) .Marland Kitchen.. 1 by mouth once daily 3)  Vit-d  800mg   .... 1 by mouth once daily 4)  Vitamin C 100 Mg Tabs (Ascorbic acid) .Marland Kitchen.. 1 by mouth once daily 5)  Centrum Silver Tabs (Multiple vitamins-minerals) .Marland Kitchen.. 1 by mouth once daily 6)  Tums Ultra 1000 1000 Mg Chew (Calcium carbonate antacid) .... 2 by mouth once daily 7)  Advil 200 Mg Tabs (Ibuprofen) .... 2 by mouth once daily 8)  Crestor 20 Mg Tabs (Rosuvastatin calcium) .Marland Kitchen.. 1 once daily 9)  Tramadol Hcl 50 Mg Tabs (Tramadol hcl) .Marland Kitchen.. 1 q 6 hrs as needed pain 10)  Voltaren 1 % Gel (Diclofenac sodium) .... Rub 1 gram into affected area qid Prescriptions: VOLTAREN 1 % GEL (DICLOFENAC SODIUM) Rub 1 gram into affected area QID  #200 gram x 3   Entered by:   Lillia Pauls CMA   Authorized by:   Enid Baas MD   Signed by:   Lillia Pauls CMA on 09/10/2009   Method used:   Electronically to        [facility]* (mail-order)             ,          Ph: [phone]       Fax: [phone]   RxID:   [id number]

## 2010-09-09 NOTE — Progress Notes (Signed)
  Called [facility]- they state they can hold the rx for 90 days- asked them to do this.  Pt states she is not out of the med, was just requesting the refill to make sure she had it.     ---- 09/03/2010 11:29 AM, Judge Stall wrote: [facility] called and would like you to call them back at [phone] option 1.  Reference # [id number].  They have a request for Voltoren Gel from 1/26 and it is back ordered. ------------------------------

## 2010-10-02 ENCOUNTER — Ambulatory Visit (INDEPENDENT_AMBULATORY_CARE_PROVIDER_SITE_OTHER): Payer: Medicare Other | Admitting: Internal Medicine

## 2010-10-02 ENCOUNTER — Other Ambulatory Visit: Payer: Self-pay | Admitting: Internal Medicine

## 2010-10-02 ENCOUNTER — Encounter: Payer: Self-pay | Admitting: Internal Medicine

## 2010-10-02 DIAGNOSIS — R3915 Urgency of urination: Secondary | ICD-10-CM

## 2010-10-02 DIAGNOSIS — K5730 Diverticulosis of large intestine without perforation or abscess without bleeding: Secondary | ICD-10-CM

## 2010-10-02 DIAGNOSIS — R32 Unspecified urinary incontinence: Secondary | ICD-10-CM | POA: Insufficient documentation

## 2010-10-02 DIAGNOSIS — R1032 Left lower quadrant pain: Secondary | ICD-10-CM

## 2010-10-02 HISTORY — DX: Urgency of urination: R39.15

## 2010-10-02 HISTORY — DX: Diverticulosis of large intestine without perforation or abscess without bleeding: K57.30

## 2010-10-02 LAB — CBC WITH DIFFERENTIAL/PLATELET
Basophils Absolute: 0 10*3/uL (ref 0.0–0.1)
Eosinophils Absolute: 0.2 10*3/uL (ref 0.0–0.7)
HCT: 40.7 % (ref 36.0–46.0)
Hemoglobin: 13.9 g/dL (ref 12.0–15.0)
Lymphs Abs: 2.2 10*3/uL (ref 0.7–4.0)
MCHC: 34.1 g/dL (ref 30.0–36.0)
Monocytes Absolute: 0.8 10*3/uL (ref 0.1–1.0)
Neutro Abs: 5.5 10*3/uL (ref 1.4–7.7)
RDW: 13.9 % (ref 11.5–14.6)

## 2010-10-02 LAB — CONVERTED CEMR LAB
Nitrite: NEGATIVE
Specific Gravity, Urine: 1.01
WBC Urine, dipstick: NEGATIVE

## 2010-10-05 ENCOUNTER — Encounter: Payer: Self-pay | Admitting: Internal Medicine

## 2010-10-07 ENCOUNTER — Encounter: Payer: Self-pay | Admitting: Internal Medicine

## 2010-10-08 NOTE — Assessment & Plan Note (Signed)
Summary: abdominal pain/UTI?/cdj   Vital Signs:  Patient profile:   65 year old female Weight:      278 pounds BMI:     36.07 Temp:     98.3 degrees F oral Pulse rate:   84 / minute Resp:     14 per minute BP sitting:   130 / 82  (left arm) Cuff size:   large  Vitals Entered By: Shonna Chock CMA (October 02, 2010 1:06 PM) CC: Abdominal pain, cramping, and pressure since Tuesday, Dysuria   CC:  Abdominal pain, cramping, and pressure since Tuesday, and Dysuria.  History of Present Illness:    Onset 09/29/2010 as L > R abdominal pain; she now  denies nausea, vomiting, diarrhea, constipation, melena, hematochezia, anorexia, and hematemesis.  The location of the pain is  LLQ > right lower quadrant ; it is  cramping in quality and  non radiating .  The patient denies the following symptoms: fever, weight loss, dysuria, jaundice, dark urine, and vaginal bleeding or discharge.  She  reports urinary frequency, incontinence X 2  on 02/29 & 03/01 , &  urgency, but denies hematuria. Rx: heat & Tramadol with benefit.  History is significant for no urinary tract problems.  PMH of prolapse; ? Fallopian tube stone years ago  as per Dr Randell Patient with similar pain .PMH of  Diverticulitis & colon polyps;F/U  due 2014 as per Dr Ewing Schlein.  Current Medications (verified): 1)  Travatan Z 0.004 % Soln (Travoprost) .Marland Kitchen.. 1 Drop in Left Eye Once Daily 2)  Fish Oil 1200 Mg  Caps (Omega-3 Fatty Acids) .Marland Kitchen.. 1 By Mouth Once Daily 3)  Vitamin D 1000 Unit Tabs (Cholecalciferol) .Marland Kitchen.. 1 By Mouth Once Daily 4)  Vitamin C 1000 Mg Tabs (Ascorbic Acid) .Marland Kitchen.. 1 By Mouth Once Daily 5)  Centrum Silver   Tabs (Multiple Vitamins-Minerals) .Marland Kitchen.. 1 By Mouth Once Daily 6)  Tums Ultra 1000 1000 Mg  Chew (Calcium Carbonate Antacid) .... 2 By Mouth Once Daily 7)  Advil 200 Mg  Tabs (Ibuprofen) .... 2 By Mouth Once Daily 8)  Crestor 10 Mg Tabs (Rosuvastatin Calcium) .... Take One Tablet At Bedtime 9)  Tramadol Hcl 50 Mg Tabs (Tramadol Hcl) ....  Take One Tab Q6 Hours As Needed Pain Dispense #120 For 90 Days 10)  Voltaren 1 % Gel (Diclofenac Sodium) .... Rub 1 Gram Into Affected Area Qid  Allergies (verified): No Known Drug Allergies  Past History:  Past Medical History: Fasting hyperglycemia: A1c 6.3% in 01/2010 Glaucoma, Dr Lyndon Code [facility] Colonic polyps,PMH  of Hyperlipidemia: Framingham Study LDL goal = < 160.(note: goal = < 100 if DM present). NMR Lipoprofile 2005: total particle # 1776, small dense 1106 on Lipitor.  Diverticulosis, colon  Past Surgical History: Colonoscopy: polyps,tics in 2002 & 2006; 2010: lipoma, Dr Vernia Buff Magod(repeat 2014) cholecystectomy 12/2004-right trabelectomy for glaucoma 03/2006- cataract surgery , lens transplant OD  Physical Exam  General:  well-nourished,in no acute distress; alert,appropriate and cooperative throughout examination Eyes:  No corneal or conjunctival inflammation noted. No icterus Mouth:  Oral mucosa and oropharynx without lesions or exudates.  Teeth in good repair. No pharyngeal erythema.   Lungs:  Normal respiratory effort, chest expands symmetrically. Lungs are clear to auscultation, no crackles or wheezes. Heart:  Normal rate and regular rhythm. S1 and S2 normal without gallop, murmur, click, rub . S4 with slurring Abdomen:  Bowel sounds positive,abdomen soft  tender  LLQ without masses, organomegaly or hernias noted. Extremities:  trace left  &  R pedal edema.   Neurologic:  alert & oriented X3.   Skin:  Intact without suspicious lesions or rashes No jaundice  Cervical Nodes:  No lymphadenopathy noted Axillary Nodes:  No palpable lymphadenopathy Psych:  memory intact for recent and remote, normally interactive, and good eye contact.     Impression & Recommendations:  Problem # 1:  ABDOMINAL PAIN, LEFT LOWER QUADRANT (ICD-789.04)  LLQ > RLQ; R/O Diverticulitis  Orders: Venipuncture (16109) TLB-CBC Platelet - w/Differential (85025-CBCD) T-Culture, Urine  ([id number])  Problem # 2:  URINARY URGENCY (BJY-782.95)  Orders: T-Culture, Urine ([id number])  Problem # 3:  URINARY INCONTINENCE (ICD-788.30)  X2, last 03/01  Orders: Venipuncture (46962) T-Culture, Urine ([id number])  Complete Medication List: 1)  Travatan Z 0.004 % Soln (Travoprost) .Marland Kitchen.. 1 drop in left eye once daily 2)  Fish Oil 1200 Mg Caps (Omega-3 fatty acids) .Marland Kitchen.. 1 by mouth once daily 3)  Vitamin D 1000 Unit Tabs (Cholecalciferol) .Marland Kitchen.. 1 by mouth once daily 4)  Vitamin C 1000 Mg Tabs (Ascorbic acid) .Marland Kitchen.. 1 by mouth once daily 5)  Centrum Silver Tabs (Multiple vitamins-minerals) .Marland Kitchen.. 1 by mouth once daily 6)  Tums Ultra 1000 1000 Mg Chew (Calcium carbonate antacid) .... 2 by mouth once daily 7)  Advil 200 Mg Tabs (Ibuprofen) .... 2 by mouth once daily 8)  Crestor 10 Mg Tabs (Rosuvastatin calcium) .... Take one tablet at bedtime 9)  Tramadol Hcl 50 Mg Tabs (Tramadol hcl) .... Take one tab q6 hours as needed pain dispense #120 for 90 days 10)  Voltaren 1 % Gel (Diclofenac sodium) .... Rub 1 gram into affected area qid 11)  Ciprofloxacin Hcl 500 Mg Tabs (Ciprofloxacin hcl) .Marland Kitchen.. 1 two times a day 12)  Metronidazole 500 Mg Tabs (Metronidazole) .Marland Kitchen.. 1 three times a day  Other Orders: UA Dipstick w/o Micro (manual) (01027)  Patient Instructions: 1)  Drink clear liquids only for the next 24 hours, then slowly add other liquids and food as you  tolerate them. 2)  Drink as much NON dairy  fluid as you can tolerate for the next few days. Prescriptions: METRONIDAZOLE 500 MG TABS (METRONIDAZOLE) 1 three times a day  #21 x 0   Entered and Authorized by:   Marga Melnick MD   Signed by:   Marga Melnick MD on 10/02/2010   Method used:   Electronically to        [facility] [phone]* (retail)       [address]       [place], [place]  [address]       Ph: [phone] or [phone]       Fax: [phone]   RxID:   [id number] CIPROFLOXACIN HCL  500 MG TABS (CIPROFLOXACIN HCL) 1 two times a day  #14 x 0   Entered and Authorized by:   Marga Melnick MD   Signed by:   Marga Melnick MD on 10/02/2010   Method used:   Electronically to        [facility] [phone]* (retail)       [address]       [place], [place]  [address]       Ph: [phone] or [phone]       Fax: [phone]   RxID:   [id number]    Orders Added: 1)  UA Dipstick w/o Micro (manual) [81002] 2)  Est. Patient Level III [96789] 3)  Venipuncture [95284] 4)  TLB-CBC Platelet - w/Differential [85025-CBCD] 5)  T-Culture, Urine [[id number]]    Laboratory Results   Urine Tests    Routine Urinalysis   Color: yellow Appearance: Clear Glucose: negative   (Normal Range: Negative) Bilirubin: negative   (Normal Range: Negative) Ketone: negative   (Normal Range: Negative) Spec. Gravity: 1.010   (Normal Range: 1.003-1.035) Blood: negative   (Normal Range: Negative) pH: 6.0   (Normal Range: 5.0-8.0) Protein: negative   (Normal Range: Negative) Urobilinogen: 0.2   (Normal Range: 0-1) Nitrite: negative   (Normal Range: Negative) Leukocyte Esterace: negative   (Normal Range: Negative)       Appended Document: abdominal pain/UTI?/cdj

## 2010-10-08 NOTE — Miscellaneous (Signed)
Summary: Orders Update  Clinical Lists Changes  Orders: Added new Service order of Prescription Created Electronically (G8553) - Signed 

## 2010-10-12 ENCOUNTER — Encounter: Payer: Self-pay | Admitting: Internal Medicine

## 2010-12-18 NOTE — Procedures (Signed)
[place]  Patient:    Morgan Huff, Morgan Huff                    MRN: [id number] Proc. Date: 11/07/00 Adm. Date:  [id number] Attending:  Nelda Marseille CC:         Almedia Balls. Randell Patient, M.D.  Titus Dubin. Alwyn Ren, M.D. [facility]   Procedure Report  PROCEDURE PERFORMED:  Colonoscopy with biopsy.  ENDOSCOPIST:  Petra Kuba, M.D.  INDICATIONS FOR PROCEDURE:  Patient with loose stools due for colonic screening.  Consent was signed after risks, benefits, methods, and options were thoroughly discussed in the office.  MEDICATIONS USED:  Demerol 75 mg, Versed 7.5 mg.  DESCRIPTION OF PROCEDURE:  Rectal inspection was pertinent for obvious external hemorrhoids.  Digital exam was negative.  A video colonoscope was inserted and fairly easily advanced around the colon to the cecum.  This did require some abdominal pressure and rolling her on her back and then on her right side.  On insertion, some left greater than right diverticula were seen but no other abnormalities. The cecum was identified by the appendiceal orifice and the ileocecal valve.  In fact the scope was inserted a short ways into the terminal ileum which was normal.  Photodocumentation was obtained. Scattered  random biopsies of the terminal ileum were obtained and put in the first container and random colon biopsies were obtained and put in the second container.  The prep was adequate.  On slow withdrawal through the colon there was some liquid stool that required washing and suctioning.  The diverticula were confirmed as mentioned above, left greater than right.  In the descending, a questionable tiny hyperplastic appearing polyp was seen as was in the mid to distal sigmoid, another small polyp was seen.  Both were cold biopsied x 1 or 2 and put in a third container.  No other abnormalities were seen as we slowly withdrew back to the rectum.  Once back in the rectum, the scope was retroflexed,  pertinent for some internal hemorrhoids.  The scope was straightened, air was withdrawn, the scope removed.  The patient tolerated the procedure well.  There was no obvious immediate complication.  ENDOSCOPIC DIAGNOSIS: 1. Internal and external hemorrhoids. 2. Left greater than right diverticula. 3. Two questionable tiny polyps in the descending cold biopsied. 4. Otherwise within normal limits to the terminal ileum with random biopsies    throughout.  PLAN:  Await pathology.  Diverticula and hemorrhoid brochures will be given, possibly work on fiber in her diet.  Follow-up p.r.n. or in two to three months, recheck symptoms and decide on any medicine trials or other work-up and plans. DD:  11/07/00 TD:  11/07/00 Job: [id number]

## 2011-02-05 ENCOUNTER — Other Ambulatory Visit: Payer: Self-pay | Admitting: Internal Medicine

## 2011-02-08 ENCOUNTER — Other Ambulatory Visit (INDEPENDENT_AMBULATORY_CARE_PROVIDER_SITE_OTHER): Payer: Medicare Other

## 2011-02-08 DIAGNOSIS — Z0000 Encounter for general adult medical examination without abnormal findings: Secondary | ICD-10-CM

## 2011-02-08 DIAGNOSIS — Z8249 Family history of ischemic heart disease and other diseases of the circulatory system: Secondary | ICD-10-CM

## 2011-02-08 DIAGNOSIS — E785 Hyperlipidemia, unspecified: Secondary | ICD-10-CM

## 2011-02-08 NOTE — Progress Notes (Signed)
Labs only

## 2011-02-09 LAB — CBC WITH DIFFERENTIAL/PLATELET
Basophils Absolute: 0 10*3/uL (ref 0.0–0.1)
HCT: 42.5 % (ref 36.0–46.0)
Hemoglobin: 14.3 g/dL (ref 12.0–15.0)
Lymphs Abs: 1.9 10*3/uL (ref 0.7–4.0)
MCV: 91.9 fl (ref 78.0–100.0)
Monocytes Absolute: 0.5 10*3/uL (ref 0.1–1.0)
Monocytes Relative: 8.8 % (ref 3.0–12.0)
Neutro Abs: 3.3 10*3/uL (ref 1.4–7.7)
Platelets: 233 10*3/uL (ref 150.0–400.0)
RDW: 13.7 % (ref 11.5–14.6)

## 2011-02-09 NOTE — Progress Notes (Signed)
Addended by: Legrand Como on: 02/09/2011 11:44 AM   Modules accepted: Orders

## 2011-02-26 ENCOUNTER — Encounter: Payer: Self-pay | Admitting: Internal Medicine

## 2011-03-04 ENCOUNTER — Encounter: Payer: Self-pay | Admitting: Internal Medicine

## 2011-03-17 ENCOUNTER — Encounter: Payer: Self-pay | Admitting: Internal Medicine

## 2011-03-17 ENCOUNTER — Ambulatory Visit (INDEPENDENT_AMBULATORY_CARE_PROVIDER_SITE_OTHER): Payer: Medicare Other | Admitting: Internal Medicine

## 2011-03-17 VITALS — BP 124/80 | HR 72 | Temp 98.6°F | Resp 12 | Ht 73.75 in | Wt 265.2 lb

## 2011-03-17 DIAGNOSIS — Z23 Encounter for immunization: Secondary | ICD-10-CM

## 2011-03-17 DIAGNOSIS — E8881 Metabolic syndrome: Secondary | ICD-10-CM

## 2011-03-17 DIAGNOSIS — R498 Other voice and resonance disorders: Secondary | ICD-10-CM

## 2011-03-17 DIAGNOSIS — H409 Unspecified glaucoma: Secondary | ICD-10-CM | POA: Insufficient documentation

## 2011-03-17 DIAGNOSIS — Z0000 Encounter for general adult medical examination without abnormal findings: Secondary | ICD-10-CM

## 2011-03-17 DIAGNOSIS — R7309 Other abnormal glucose: Secondary | ICD-10-CM

## 2011-03-17 DIAGNOSIS — Z86010 Personal history of colonic polyps: Secondary | ICD-10-CM

## 2011-03-17 DIAGNOSIS — E785 Hyperlipidemia, unspecified: Secondary | ICD-10-CM

## 2011-03-17 HISTORY — DX: Unspecified glaucoma: H40.9

## 2011-03-17 MED ORDER — NIACIN-SIMVASTATIN ER 500-20 MG PO TB24: 1.0000 | ORAL_TABLET | ORAL | Status: DC

## 2011-03-17 NOTE — Progress Notes (Signed)
Subjective:    Patient ID: Morgan Huff, female    DOB: July 22, 1946, 65 y.o.   MRN: [id number]  HPI  Morgan Huff  is here for a Welcome To Medicare Physical ;she denies acute issues.  Medicare Wellness Visit:  The following psychosocial & medical history were reviewed as required by Medicare.   Social history: caffeine: none , alcohol:  rarely ,  tobacco use : never  & exercise : yard work, house work.   Home & personal  safety / fall risk: no, activities of daily living: no , seatbelt use : yes , and smoke alarm employment : yes .  Power of Attorney/Living Will status : in place  Vision ( as recorded per Nurse) & Hearing  evaluation :  Whisper heard @ 6 ft. Orientation :oriented X 3 , memory & recall :good, spelling or math testing: good,and mood & affect : normal . Depression / anxiety: denied Travel history : 2005 [place] , immunization status :up to date , transfusion history:  no, and preventive health surveillance ( colonoscopies, BMD , etc as per protocol/ [facility]): see notes, Dental care:  yes . Chart reviewed &  Updated. Active issues reviewed & addressed.        Review of Systems Dyslipidemia assessment: Prior Advanced Lipid Testing: NMR 01/2004.   Family history of premature CAD/ MI: no .  Nutrition: Nutri-System .  Exercise: no . Diabetes : A1c 5.8% . HTN: no Smoking history  : never .   Weight :  15 # loss with new plan. ROS: fatigue: no ; chest pain : no ;claudication: no; palpitations: no; abd pain/bowel changes: no ; myalgias:no;  syncope : no ; memory loss: no;skin changes: no. Lab results reviewed :[place] Heart Panel:HDL 47,Lp(a) 112(< 50). LDL 66 on Crestor 10 mg daily     Objective:   Physical Exam Gen.: Healthy and well-nourished in appearance. Alert, appropriate and cooperative throughout exam. Head: Normocephalic without obvious abnormalities. Eyes: No corneal or conjunctival inflammation noted. Pupils equal round reactive to light and accommodation. Fundal exam  is benign without hemorrhages, exudate, papilledema. Extraocular motion intact.  Ears: External  ear exam reveals no significant lesions or deformities. Canals clear .TMs normal. Hearing is grossly normal bilaterally. Nose: External nasal exam reveals no deformity or inflammation. Nasal mucosa are pink and moist. No lesions or exudates noted. Mouth: Oral mucosa and oropharynx reveal no lesions or exudates. Teeth in good repair. Neck: No deformities, masses, or tenderness noted. Range of motion & . Thyroid normal. Cervical osteophyte suggested on L Lungs: Normal respiratory effort; chest expands symmetrically. Lungs are clear to auscultation without rales, wheezes, or increased work of breathing. Heart: Normal rate and rhythm. Normal S1 and S2. No gallop, click, or rub. Grade 1/2- 1 LSB systolic  murmur. Abdomen: Bowel sounds normal; abdomen soft and nontender. No masses, organomegaly or hernias noted. Genitalia: Dr Rana Snare   .                                                                                   Musculoskeletal/extremities: No deformity or scoliosis noted of  the thoracic or lumbar spine. No clubbing, cyanosis, edema, or deformity noted. Range  of motion  normal .Tone & strength  normal.Joints ;minor DIP OA changes. Nail health  Good. Crepitus of knees Vascular: Carotid, radial artery, dorsalis pedis and  posterior tibial pulses are full and equal. No bruits present. Neurologic: Alert and oriented x3. Deep tendon reflexes symmetrical and normal.          Skin: Intact without suspicious lesions or rashes. Venous spiders . Biopsy scars  over  R shin Lymph: No cervical, axillary  lymphadenopathy present. Psych: Mood and affect are normal. Normally interactive                                                                                         Assessment & Plan:  #1 #1 Medicare Wellness Exam; criteria met ; data entered #2 Problem List reviewed ; Assessment/ Recommendations made Plan:  see Orders   #2 see Problem List with Assessments & Recommendations #3 dyslipidemia risks as noted above Plan: see Orders

## 2011-03-17 NOTE — Patient Instructions (Signed)
Preventive Health Care: Exercise  30-45  minutes a day, 3-4 days a week. Walking is especially valuable in preventing Osteoporosis. Eat a low-fat diet with lots of fruits and vegetables, up to 7-9 servings per day. Avoid obesity; your goal is waist measurement < 35 inches.Consume less than 30 grams of sugar per day from foods & drinks with High Fructose Corn Sugar as #1,2,3 or # 4 on label. Follow the low carb nutrition program in [employer] as closely as possible to prevent Diabetes progression & complications. White carbohydrates (potatoes, rice, bread, and pasta) have a high spike of sugar and a high load of sugar. For example a  baked potato has a cup of sugar and a  french fry  2 teaspoons of sugar. Yams, wild  rice, whole grained bread &  wheat pasta have been much lower spike and load of  sugar. Portions should be the size of a deck of cards or your palm.  Please  schedule fasting Labs after 10 weeks of Simcor 20/500 : Lipids, hepatic panel, CK ( 272.4)

## 2011-03-19 ENCOUNTER — Telehealth: Payer: Self-pay | Admitting: Internal Medicine

## 2011-03-19 NOTE — Telephone Encounter (Signed)
Pt called would like alternative to simcor says medication is expensive around $100 and would like alternative.

## 2011-03-19 NOTE — Telephone Encounter (Signed)
The alternative would be  generic simvastatin plus Niaspan. She should check to see if her insurance  will cover the Niaspan which is needed to address the risk documented in the [facility] heart panel as we discussed.

## 2011-03-22 MED ORDER — SIMVASTATIN 20 MG PO TABS: 20.0000 mg | ORAL_TABLET | Freq: Every day | ORAL | Status: DC

## 2011-03-22 MED ORDER — NIACIN ER (ANTIHYPERLIPIDEMIC) 500 MG PO TBCR: 500.0000 mg | EXTENDED_RELEASE_TABLET | Freq: Every day | ORAL | Status: DC

## 2011-03-22 NOTE — Telephone Encounter (Signed)
Spoke w/ pt aware of information informed will send rx to pharmacy

## 2011-03-22 NOTE — Telephone Encounter (Signed)
Addended by: Doristine Devoid on: 03/22/2011 03:27 PM   Modules accepted: Orders

## 2011-05-25 ENCOUNTER — Other Ambulatory Visit: Payer: Self-pay | Admitting: Internal Medicine

## 2011-05-25 DIAGNOSIS — E785 Hyperlipidemia, unspecified: Secondary | ICD-10-CM

## 2011-05-26 ENCOUNTER — Other Ambulatory Visit (INDEPENDENT_AMBULATORY_CARE_PROVIDER_SITE_OTHER): Payer: Medicare Other

## 2011-05-26 DIAGNOSIS — E785 Hyperlipidemia, unspecified: Secondary | ICD-10-CM

## 2011-05-26 LAB — HEPATIC FUNCTION PANEL
ALT: 14 U/L (ref 0–35)
AST: 20 U/L (ref 0–37)
Bilirubin, Direct: 0 mg/dL (ref 0.0–0.3)
Total Bilirubin: 0.6 mg/dL (ref 0.3–1.2)
Total Protein: 7.2 g/dL (ref 6.0–8.3)

## 2011-05-26 LAB — CK: Total CK: 130 U/L (ref 7–177)

## 2011-05-26 NOTE — Progress Notes (Signed)
Labs only

## 2011-05-28 LAB — NMR LIPOPROFILE WITH LIPIDS
Cholesterol, Total: 146 mg/dL (ref <200)
LDL Particle Number: 1169 nmol/L — ABNORMAL HIGH (ref <1000)
LDL Size: 20.8 nm (ref >20.5)
LP-IR Score: 58 — ABNORMAL HIGH (ref <=45)
Small LDL Particle Number: 542 nmol/L — ABNORMAL HIGH (ref <=527)
Triglycerides: 135 mg/dL (ref <150)

## 2011-05-30 ENCOUNTER — Encounter: Payer: Self-pay | Admitting: Internal Medicine

## 2011-06-02 ENCOUNTER — Telehealth: Payer: Self-pay | Admitting: Internal Medicine

## 2011-06-02 NOTE — Telephone Encounter (Signed)
Patient scheduled appt [phone]

## 2011-06-02 NOTE — Telephone Encounter (Signed)
Message copied by Mikey Bussing on Wed Jun 02, 2011 11:36 AM ------      Message from: Pecola Lawless      Created: Sun May 30, 2011 11:19 AM       Please ask her to schedule followup prior to next refill medications. Bring copy of any prior [facility]. We'll go through her list of questions & discuss long range risks & options.

## 2011-06-04 ENCOUNTER — Encounter: Payer: Self-pay | Admitting: Internal Medicine

## 2011-06-07 ENCOUNTER — Encounter: Payer: Self-pay | Admitting: Internal Medicine

## 2011-06-07 ENCOUNTER — Ambulatory Visit (INDEPENDENT_AMBULATORY_CARE_PROVIDER_SITE_OTHER): Payer: Medicare Other | Admitting: Internal Medicine

## 2011-06-07 VITALS — BP 140/90 | HR 67 | Temp 98.5°F | Ht 73.5 in | Wt 250.0 lb

## 2011-06-07 DIAGNOSIS — E785 Hyperlipidemia, unspecified: Secondary | ICD-10-CM

## 2011-06-07 NOTE — Progress Notes (Signed)
  Subjective:    Patient ID: Morgan Huff, female    DOB: 1946/06/29, 65 y.o.   MRN: [id number]  HPI Dyslipidemia assessment: Prior Advanced Lipid Testing: NMR Lipoprofile 10/28: LDL 78 (1169/542), HDL 41, TG 135.   Family history of premature CAD/ MI: MGF MI in 72s .  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  Exercise: 2 hrs/ week . Diabetes : A1c 5.8% . HTN: BP averages 120/80 @ home. Smoking history  : never .   Weight :  Dropping with diet, down 30 #.  Lab results reviewed :BH Panel:hyperabsorber; Lp(a) 112; CRP 2.7 with normal LpPLA2.     Review of Systems ROS: fatigue: no ; chest pain : no ;claudication: no; palpitations: no; abd pain/bowel changes: up to 3 BMs/ day, sometimes less formed ; myalgias:no;  syncope : no ; memory loss: no;skin changes: no.     Objective:   Physical Exam  she is healthy and well-nourished in appearance  An S4 is noted without significant murmurs.  Chest is clear auscultation with no increased work of breathing or abnormal breath sounds  All pulses are intact without deficit  She spoke is an intelligent; she understood the discussion of the advanced cholesterol testing        Assessment & Plan:  #1 dyslipidemia; LDL goal is less than 100. She is a Corporate treasurer. Lp (a) of  112 is  greatest risk. There is no significant family history of premature coronary disease  Plan: She will employ over-the-counter niacin. Niaspan caused $150 per 3  months.  She'll decrease  simvastatin to 20 mg one half at bedtime and recheck fasting lipids in 4-6 months.

## 2011-06-07 NOTE — Patient Instructions (Addendum)
Please  schedule fasting Labs 4-6  : Lipids, hepatic panel,272.4, 995.20.  Please bring these instructions to that Lab appt.

## 2011-07-07 ENCOUNTER — Telehealth: Payer: Self-pay | Admitting: Internal Medicine

## 2011-07-07 NOTE — Telephone Encounter (Signed)
Okay to wait until mail order Niaspan comes in.

## 2011-07-07 NOTE — Telephone Encounter (Signed)
Patient was taking niaspan - patient change to  Niacin but she doesnt like the side effect - had tingling & itching & redness- she wants to change back to niaspan---she want to know if it will be all right to be off the med until she gets order from her mail order

## 2011-07-07 NOTE — Telephone Encounter (Signed)
Discuss with patient  

## 2011-08-09 DIAGNOSIS — H2589 Other age-related cataract: Secondary | ICD-10-CM | POA: Diagnosis not present

## 2011-08-09 DIAGNOSIS — H401390 Pigmentary glaucoma, unspecified eye, stage unspecified: Secondary | ICD-10-CM | POA: Diagnosis not present

## 2011-08-09 DIAGNOSIS — H409 Unspecified glaucoma: Secondary | ICD-10-CM | POA: Diagnosis not present

## 2011-08-11 DIAGNOSIS — H2589 Other age-related cataract: Secondary | ICD-10-CM | POA: Diagnosis not present

## 2011-08-11 DIAGNOSIS — H409 Unspecified glaucoma: Secondary | ICD-10-CM | POA: Diagnosis not present

## 2011-08-11 DIAGNOSIS — H401390 Pigmentary glaucoma, unspecified eye, stage unspecified: Secondary | ICD-10-CM | POA: Diagnosis not present

## 2011-08-30 DIAGNOSIS — H2640 Unspecified secondary cataract: Secondary | ICD-10-CM | POA: Diagnosis not present

## 2011-10-06 DIAGNOSIS — Z1231 Encounter for screening mammogram for malignant neoplasm of breast: Secondary | ICD-10-CM | POA: Diagnosis not present

## 2011-10-13 ENCOUNTER — Other Ambulatory Visit: Payer: Self-pay

## 2011-10-13 MED ORDER — SIMVASTATIN 20 MG PO TABS: ORAL_TABLET | ORAL | Status: DC

## 2011-10-13 MED ORDER — NIACIN ER (ANTIHYPERLIPIDEMIC) 500 MG PO TBCR: 500.0000 mg | EXTENDED_RELEASE_TABLET | Freq: Every day | ORAL | Status: DC

## 2011-10-13 NOTE — Telephone Encounter (Signed)
Spoke with patient, patient states OTC Niacin had severe side effect (face bright red and tingling), patient states she spoke with Dr.Hopper and he ok'd her to switch back to the Rx'ed Niaspan

## 2011-10-15 ENCOUNTER — Encounter: Payer: Self-pay | Admitting: Internal Medicine

## 2011-12-06 ENCOUNTER — Other Ambulatory Visit (INDEPENDENT_AMBULATORY_CARE_PROVIDER_SITE_OTHER): Payer: Medicare Other

## 2011-12-06 DIAGNOSIS — T887XXA Unspecified adverse effect of drug or medicament, initial encounter: Secondary | ICD-10-CM

## 2011-12-06 DIAGNOSIS — E785 Hyperlipidemia, unspecified: Secondary | ICD-10-CM | POA: Diagnosis not present

## 2011-12-06 LAB — LIPID PANEL
HDL: 48.7 mg/dL (ref >39.00)
Total CHOL/HDL Ratio: 3
VLDL: 27 mg/dL (ref 0.0–40.0)

## 2011-12-06 LAB — HEPATIC FUNCTION PANEL
Bilirubin, Direct: 0.1 mg/dL (ref 0.0–0.3)
Total Bilirubin: 0.8 mg/dL (ref 0.3–1.2)

## 2011-12-06 NOTE — Progress Notes (Signed)
Labs only

## 2012-02-07 DIAGNOSIS — H2589 Other age-related cataract: Secondary | ICD-10-CM | POA: Diagnosis not present

## 2012-02-07 DIAGNOSIS — H409 Unspecified glaucoma: Secondary | ICD-10-CM | POA: Diagnosis not present

## 2012-02-07 DIAGNOSIS — H401390 Pigmentary glaucoma, unspecified eye, stage unspecified: Secondary | ICD-10-CM | POA: Diagnosis not present

## 2012-03-21 ENCOUNTER — Ambulatory Visit (INDEPENDENT_AMBULATORY_CARE_PROVIDER_SITE_OTHER): Payer: Medicare Other | Admitting: Internal Medicine

## 2012-03-21 ENCOUNTER — Encounter: Payer: Self-pay | Admitting: Internal Medicine

## 2012-03-21 VITALS — BP 124/80 | HR 71 | Temp 98.9°F | Resp 12 | Ht 73.08 in | Wt 233.6 lb

## 2012-03-21 DIAGNOSIS — E785 Hyperlipidemia, unspecified: Secondary | ICD-10-CM | POA: Diagnosis not present

## 2012-03-21 DIAGNOSIS — R030 Elevated blood-pressure reading, without diagnosis of hypertension: Secondary | ICD-10-CM

## 2012-03-21 DIAGNOSIS — Z0000 Encounter for general adult medical examination without abnormal findings: Secondary | ICD-10-CM

## 2012-03-21 DIAGNOSIS — Z86010 Personal history of colonic polyps: Secondary | ICD-10-CM

## 2012-03-21 DIAGNOSIS — R7309 Other abnormal glucose: Secondary | ICD-10-CM | POA: Diagnosis not present

## 2012-03-21 MED ORDER — NIACIN ER (ANTIHYPERLIPIDEMIC) 500 MG PO TBCR: 500.0000 mg | EXTENDED_RELEASE_TABLET | Freq: Every day | ORAL | Status: DC

## 2012-03-21 MED ORDER — SIMVASTATIN 20 MG PO TABS: ORAL_TABLET | ORAL | Status: DC

## 2012-03-21 NOTE — Patient Instructions (Addendum)
Preventive Health Care: Exercise  30-45  minutes a day, 3-4 days a week. Walking is especially valuable in preventing Osteoporosis. Eat a low-fat diet with lots of fruits and vegetables, up to 7-9 servings per day.  Consume less than 30 grams(preferably ZERO) of sugar per day from foods & drinks with High Fructose Corn Syrup as # 1,2,3 or #4 on label. Blood Pressure Goal  Ideally is an AVERAGE < 135/85. This AVERAGE should be calculated from @ least 5-7 BP readings taken @ different times of day on different days of week. You should not respond to isolated BP readings , but rather the AVERAGE for that week   If you activate My Chart; the results can be released to you as soon as they populate from the lab. If you choose not to use this program; the labs have to be reviewed, copied & mailed   causing a delay in getting the results to you.

## 2012-03-21 NOTE — Progress Notes (Signed)
Subjective:    Patient ID: Morgan Huff, female    DOB: Jul 27, 1946, 66 y.o.   MRN: [id number]  HPI Medicare Wellness Visit:  The following psychosocial & medical history were reviewed as required by Medicare.   Social history: caffeine: none , alcohol:  rarely ,  tobacco use :never  & exercise : minimal.   Home & personal  safety / fall risk: no issues except occasional dizziness, activities of daily living: no limitations , seatbelt use : yes , and smoke alarm employment : yes .  Power of Attorney/Living Will status : in place  Vision ( as recorded per Nurse) & Hearing  evaluation :  Ongoing eye care; no hearing evaluation. Orientation :oriented X 3 , memory & recall :good,  math testing: good,and mood & affect : normal . Depression / anxiety: increased relationship  stress Travel history : last in 2005 [place] , immunization status : to date , transfusion history:  no, and preventive health surveillance ( colonoscopies, BMD , etc as per protocol/ [facility]): colonoscopy up to date, Dental care:  Every 6 mos . Chart reviewed &  Updated. Active issues reviewed & addressed.       Review of Systems PMH of elevated BP w/o HYPERTENSION: Disease Monitoring: Blood pressure range-110/61-134/84 Chest pain, palpitations- no       Dyspnea- chronic , unchanged Medications: Compliance- no meds  Lightheadedness,Syncope- no trigger for light headed except visual impairment    Edema- no  FASTING HYPERGLYCEMIA, PMH of:  Disease Monitoring: Blood Sugar ranges-no monitor  Polyuria/phagia/dipsia- frequency with prolapse     Visual problems- due to cataract OS   HYPERLIPIDEMIA: LDL 81 on Simvastatin 10 & Niaspan 500; HDL 48.7 on 12/20/11 Disease Monitoring: See symptoms for Hypertension Medications: Compliance- yes  Abd pain, bowel changes-no  Muscle aches-no       Objective:   Physical Exam Gen.:  well-nourished in appearance. Alert, appropriate and cooperative throughout exam. Head:  Normocephalic without obvious abnormalities  Eyes: No corneal or conjunctival inflammation noted.  Extraocular motion intact.  Ears: External  ear exam reveals no significant lesions or deformities. Canals clear .TMs normal. Hearing is grossly normal bilaterally. Nose: External nasal exam reveals no deformity or inflammation. Nasal mucosa are pink and moist. No lesions or exudates noted.    Mouth: Oral mucosa and oropharynx reveal no lesions or exudates. Teeth in good repair. Neck: No deformities, masses, or tenderness noted. Range of motion & Thyroid normal. Lungs: Normal respiratory effort; chest expands symmetrically. Lungs are clear to auscultation without rales, wheezes, or increased work of breathing. Heart: Normal rate and rhythm. Normal S1 and S2. No gallop, click, or rub. Grade 1/2 systolic  murmur. Abdomen: Bowel sounds normal; abdomen soft and nontender. No masses, organomegaly or hernias noted. Genitalia:Dr Rana Snare, Gyn Musculoskeletal/extremities: No deformity or scoliosis noted of  the thoracic or lumbar spine. No clubbing, cyanosis, edema, or deformity noted. Range of motion  normal .Tone & strength  normal.Joints normal. Nail health  good. Vascular: Carotid, radial artery, dorsalis pedis and  posterior tibial pulses are full and equal. No bruits present. Neurologic: Alert and oriented x3. Deep tendon reflexes symmetrical and normal.          Skin: Intact without suspicious lesions or rashes. Lymph: No cervical, axillary lymphadenopathy present. Psych: Mood and affect are normal. Normally interactive  Assessment & Plan:  #1 Medicare Wellness Exam; criteria met ; data entered #2 Problem List reviewed ; Assessment/ Recommendations made Plan: see Orders

## 2012-03-22 LAB — CBC WITH DIFFERENTIAL/PLATELET
Basophils Absolute: 0.1 10*3/uL (ref 0.0–0.1)
Basophils Relative: 0.7 % (ref 0.0–3.0)
Eosinophils Absolute: 0.2 10*3/uL (ref 0.0–0.7)
Eosinophils Relative: 2.2 % (ref 0.0–5.0)
HCT: 41.9 % (ref 36.0–46.0)
Hemoglobin: 14 g/dL (ref 12.0–15.0)
Lymphocytes Relative: 25.9 % (ref 12.0–46.0)
Lymphs Abs: 1.9 10*3/uL (ref 0.7–4.0)
MCHC: 33.4 g/dL (ref 30.0–36.0)
MCV: 92.7 fl (ref 78.0–100.0)
Monocytes Absolute: 0.7 10*3/uL (ref 0.1–1.0)
Monocytes Relative: 9.3 % (ref 3.0–12.0)
Neutro Abs: 4.6 10*3/uL (ref 1.4–7.7)
Neutrophils Relative %: 61.9 % (ref 43.0–77.0)
Platelets: 257 10*3/uL (ref 150.0–400.0)
RBC: 4.52 Mil/uL (ref 3.87–5.11)
RDW: 13.2 % (ref 11.5–14.6)
WBC: 7.4 10*3/uL (ref 4.5–10.5)

## 2012-03-22 LAB — BASIC METABOLIC PANEL
CO2: 29 mEq/L (ref 19–32)
Glucose, Bld: 85 mg/dL (ref 70–99)
Potassium: 4.1 mEq/L (ref 3.5–5.1)
Sodium: 139 mEq/L (ref 135–145)

## 2012-03-22 LAB — TSH: TSH: 2.05 u[IU]/mL (ref 0.35–5.50)

## 2012-03-27 DIAGNOSIS — H2589 Other age-related cataract: Secondary | ICD-10-CM | POA: Diagnosis not present

## 2012-06-05 DIAGNOSIS — H269 Unspecified cataract: Secondary | ICD-10-CM | POA: Diagnosis not present

## 2012-06-05 DIAGNOSIS — H2589 Other age-related cataract: Secondary | ICD-10-CM | POA: Diagnosis not present

## 2012-06-05 DIAGNOSIS — H401390 Pigmentary glaucoma, unspecified eye, stage unspecified: Secondary | ICD-10-CM | POA: Diagnosis not present

## 2012-06-05 DIAGNOSIS — H2640 Unspecified secondary cataract: Secondary | ICD-10-CM | POA: Diagnosis not present

## 2012-06-22 DIAGNOSIS — Z9849 Cataract extraction status, unspecified eye: Secondary | ICD-10-CM | POA: Diagnosis not present

## 2012-06-22 DIAGNOSIS — H2589 Other age-related cataract: Secondary | ICD-10-CM | POA: Diagnosis not present

## 2012-06-23 DIAGNOSIS — Z961 Presence of intraocular lens: Secondary | ICD-10-CM

## 2012-06-23 HISTORY — DX: Presence of intraocular lens: Z96.1

## 2012-07-17 DIAGNOSIS — H401390 Pigmentary glaucoma, unspecified eye, stage unspecified: Secondary | ICD-10-CM | POA: Diagnosis not present

## 2012-09-11 DIAGNOSIS — H401390 Pigmentary glaucoma, unspecified eye, stage unspecified: Secondary | ICD-10-CM | POA: Diagnosis not present

## 2012-09-16 ENCOUNTER — Other Ambulatory Visit: Payer: Self-pay

## 2012-09-18 ENCOUNTER — Encounter: Payer: Self-pay | Admitting: Internal Medicine

## 2012-09-18 NOTE — Telephone Encounter (Signed)
To date extensive trials (Ex AIM HIGH) have NOT proven benefit of attempting to raise HDL with Niacin.Niaspan can be discontinued.Interventions to raise HDL or GOOD cholesterol include: exercising 30-45 minutes 3-4 X per week; including salmon & tuna in the diet;  & supplementing with Omega 3 fatty acids (Flax Seed or Fish oil )  1-2 grams per day.

## 2012-10-10 DIAGNOSIS — Z1231 Encounter for screening mammogram for malignant neoplasm of breast: Secondary | ICD-10-CM | POA: Diagnosis not present

## 2012-10-12 ENCOUNTER — Encounter: Payer: Self-pay | Admitting: Internal Medicine

## 2012-10-25 ENCOUNTER — Encounter: Payer: Self-pay | Admitting: Internal Medicine

## 2013-01-25 DIAGNOSIS — Z124 Encounter for screening for malignant neoplasm of cervix: Secondary | ICD-10-CM | POA: Diagnosis not present

## 2013-02-14 DIAGNOSIS — H401390 Pigmentary glaucoma, unspecified eye, stage unspecified: Secondary | ICD-10-CM | POA: Diagnosis not present

## 2013-02-14 DIAGNOSIS — Z961 Presence of intraocular lens: Secondary | ICD-10-CM | POA: Diagnosis not present

## 2013-02-15 DIAGNOSIS — H401390 Pigmentary glaucoma, unspecified eye, stage unspecified: Secondary | ICD-10-CM | POA: Diagnosis not present

## 2013-03-23 ENCOUNTER — Encounter: Payer: Medicare Other | Admitting: Internal Medicine

## 2013-04-06 ENCOUNTER — Encounter: Payer: Self-pay | Admitting: Internal Medicine

## 2013-04-06 ENCOUNTER — Telehealth (INDEPENDENT_AMBULATORY_CARE_PROVIDER_SITE_OTHER): Payer: Medicare Other | Admitting: Internal Medicine

## 2013-04-06 VITALS — BP 145/78 | HR 85 | Temp 98.2°F | Ht 73.75 in | Wt 252.4 lb

## 2013-04-06 DIAGNOSIS — R29898 Other symptoms and signs involving the musculoskeletal system: Secondary | ICD-10-CM | POA: Diagnosis not present

## 2013-04-06 DIAGNOSIS — Z0000 Encounter for general adult medical examination without abnormal findings: Secondary | ICD-10-CM

## 2013-04-06 DIAGNOSIS — Z1331 Encounter for screening for depression: Secondary | ICD-10-CM | POA: Diagnosis not present

## 2013-04-06 DIAGNOSIS — Z86010 Personal history of colonic polyps: Secondary | ICD-10-CM | POA: Diagnosis not present

## 2013-04-06 DIAGNOSIS — R609 Edema, unspecified: Secondary | ICD-10-CM | POA: Insufficient documentation

## 2013-04-06 DIAGNOSIS — E785 Hyperlipidemia, unspecified: Secondary | ICD-10-CM

## 2013-04-06 HISTORY — DX: Other symptoms and signs involving the musculoskeletal system: R29.898

## 2013-04-06 LAB — HEPATIC FUNCTION PANEL
ALT: 13 U/L (ref 0–35)
AST: 19 U/L (ref 0–37)
Alkaline Phosphatase: 59 U/L (ref 39–117)
Bilirubin, Direct: 0.1 mg/dL (ref 0.0–0.3)
Indirect Bilirubin: 0.5 mg/dL (ref 0.0–0.9)
Total Protein: 6.8 g/dL (ref 6.0–8.3)

## 2013-04-06 LAB — CBC WITH DIFFERENTIAL/PLATELET
Basophils Absolute: 0 10*3/uL (ref 0.0–0.1)
HCT: 41.4 % (ref 36.0–46.0)
Hemoglobin: 14.1 g/dL (ref 12.0–15.0)
Lymphocytes Relative: 31 % (ref 12–46)
Monocytes Absolute: 0.7 10*3/uL (ref 0.1–1.0)
Neutro Abs: 3.8 10*3/uL (ref 1.7–7.7)
RBC: 4.7 MIL/uL (ref 3.87–5.11)
RDW: 13.7 % (ref 11.5–15.5)
WBC: 6.9 10*3/uL (ref 4.0–10.5)

## 2013-04-06 LAB — BASIC METABOLIC PANEL
BUN: 22 mg/dL (ref 6–23)
Chloride: 106 mEq/L (ref 96–112)
Potassium: 4.1 mEq/L (ref 3.5–5.3)

## 2013-04-06 LAB — LIPID PANEL
Cholesterol: 176 mg/dL (ref 0–200)
Total CHOL/HDL Ratio: 3.5 Ratio
Triglycerides: 129 mg/dL (ref <150)
VLDL: 26 mg/dL (ref 0–40)

## 2013-04-06 LAB — TSH: TSH: 2.281 u[IU]/mL (ref 0.350–4.500)

## 2013-04-06 MED ORDER — FUROSEMIDE 20 MG PO TABS: 20.0000 mg | ORAL_TABLET | Freq: Every day | ORAL | Status: DC

## 2013-04-06 NOTE — Patient Instructions (Addendum)
Evaluate the hoarseness off the niacin. Pending labs will determine whether the simvastatin is changed.

## 2013-04-06 NOTE — Addendum Note (Signed)
Addended by: Silvio Pate D on: 04/06/2013 03:26 PM   Modules accepted: Orders

## 2013-04-06 NOTE — Progress Notes (Signed)
Subjective:    Patient ID: Morgan Huff, female    DOB: 04-08-1946, 67 y.o.   MRN: [id number]  HPI Medicare Wellness Visit:  Psychosocial & medical history were reviewed as required by Medicare (abuse,antisocial behavioral risks,firearm risk).  Social history: caffeine:none  , alcohol: rarely  ,  tobacco use: never Exercise :  See below Home & personal  safety / fall risk:tripping ? due to decreased muscle strength over past several years Limitations of activities of daily living:no Seatbelt  and smoke alarm use:yes Power of Attorney/Living Will status : in place Ophthalmology exam status :current Hearing evaluation status:not current Orientation :oriented X 3 Memory & recall :good Spelling  testing:good Active depression / anxiety:no Foreign travel history : 2005 [place]  Immunization status for Shingles /Flu/ PNA/ tetanus :current Transfusion history: no  Preventive health surveillance status of colonoscopy, BMD , mammograms,PAP as per protocol/ SOC: colonoscopy pending; others current Dental care: every 6 mos  Chart reviewed &  Updated. Active issues reviewed & addressed.      Review of Systems She is on a modified heart healthy diet; she is not exercising. Specifically she denies chest pain, palpitations, dyspnea, or claudication.  She has had edema X 1 month through the day. Family history is negative for premature coronary disease . Advanced cholesterol testing reveals her LDL goal is less than 100; ideally < 70. There is medication compliance with the statin. Significant abdominal symptoms, memory deficit, or myalgias denied. She feels the niacin may be causing hoarseness  BP @ home 123/66-132/81. .     Objective:   Physical Exam Gen.:  well-nourished in appearance. Alert, appropriate and cooperative throughout exam.  Head: Normocephalic without obvious abnormalities  Eyes: No corneal or conjunctival inflammation noted. Pupils equal round reactive to light and  accommodation.  Extraocular motion intact. Vision grossly normal with lenses Ears: External  ear exam reveals no significant lesions or deformities. Canals clear .TMs normal. Hearing is grossly normal bilaterally. Nose: External nasal exam reveals no deformity or inflammation. Nasal mucosa are pink and moist. No lesions or exudates noted.   Mouth: Oral mucosa and oropharynx reveal no lesions or exudates. Teeth in good repair. Neck: No deformities, masses, or tenderness noted. Range of motion & Thyroid normal. Lungs: Normal respiratory effort; chest expands symmetrically. Lungs are clear to auscultation without rales, wheezes, or increased work of breathing. Heart: Normal rate and rhythm. Normal S1 and S2. No gallop, click, or rub. S4 with slurring at  LSB. Abdomen: Bowel sounds normal; abdomen soft and nontender. No masses, organomegaly or hernias noted. Genitalia: As per Gyn                                  Musculoskeletal/extremities: No deformity or scoliosis noted of  the thoracic or lumbar spine.  No clubbing, cyanosis or significant extremity  deformity noted. Range of motion normal .Tone & strength  Normal.1/2-1+ edema Joints normal . Nail health good. Able to lie down & sit up w/o help. Negative SLR bilaterally Vascular: Carotid, radial artery, dorsalis pedis and  posterior tibial pulses are full and equal. No bruits present. Neurologic: Alert and oriented x3. Deep tendon reflexes symmetrical and normal.  Gait normal  including heel & toe walking .        Skin: Intact without suspicious lesions or rashes. Lymph: No cervical, axillary lymphadenopathy present. Psych: Mood and affect are normal. Normally interactive  Assessment & Plan:  #1 Medicare Wellness Exam; criteria met ; data entered #2 Problem List/Diagnoses reviewed #3 other concerns include leg weakness, hoarseness, and edema. Plan:   Assessments made/ Orders entered

## 2013-04-09 DIAGNOSIS — H409 Unspecified glaucoma: Secondary | ICD-10-CM | POA: Diagnosis not present

## 2013-04-09 DIAGNOSIS — H401331 Pigmentary glaucoma, bilateral, mild stage: Secondary | ICD-10-CM

## 2013-04-09 DIAGNOSIS — H401390 Pigmentary glaucoma, unspecified eye, stage unspecified: Secondary | ICD-10-CM | POA: Diagnosis not present

## 2013-04-09 HISTORY — DX: Pigmentary glaucoma, bilateral, mild stage: H40.1331

## 2013-04-12 ENCOUNTER — Telehealth: Payer: Self-pay | Admitting: *Deleted

## 2013-04-12 NOTE — Telephone Encounter (Signed)
Message copied by Verdie Shire on Thu Apr 12, 2013  3:25 PM ------      Message from: Pecola Lawless      Created: Sun Apr 08, 2013  8:36 AM       Please verify Britta Mccreedy retrieved lab results through My Chart.       All  lab results are excellent.      Based on your prior advanced testing, your LDL goal is < 100 , ideally < 70. Your present LDL does not  increase long term heart attack or stroke risk .The best dietary  information on cholesterol is Dr Gildardo Griffes book Eat, Drink & Be Healthy.      CK is an enzyme released with any  muscle injury or overuse. Hopp       ------

## 2013-04-12 NOTE — Telephone Encounter (Signed)
LM @ (3:26pm) asking the pt to RTC regarding note on lab results below.//AB/CMA

## 2013-04-13 ENCOUNTER — Encounter: Payer: Self-pay | Admitting: *Deleted

## 2013-04-13 NOTE — Telephone Encounter (Signed)
Spoke with the pt and informed her of recent lab results and note.  Pt understood and agreed.  Pt asked for note to be mailed to her.  Mailed pt a copy of note.//AB/CMA

## 2013-04-16 ENCOUNTER — Encounter: Payer: Self-pay | Admitting: Internal Medicine

## 2013-04-24 ENCOUNTER — Other Ambulatory Visit: Payer: Self-pay | Admitting: *Deleted

## 2013-04-24 MED ORDER — SIMVASTATIN 20 MG PO TABS: ORAL_TABLET | ORAL | Status: DC

## 2013-04-24 NOTE — Telephone Encounter (Signed)
Rx sent to the pharmacy by e-script.//AB/CMA 

## 2013-06-07 ENCOUNTER — Other Ambulatory Visit: Payer: Self-pay

## 2013-06-24 ENCOUNTER — Encounter: Payer: Self-pay | Admitting: Internal Medicine

## 2013-07-31 ENCOUNTER — Encounter: Payer: Self-pay | Admitting: Internal Medicine

## 2013-07-31 MED ORDER — OSELTAMIVIR PHOSPHATE 75 MG PO CAPS: 75.0000 mg | ORAL_CAPSULE | Freq: Two times a day (BID) | ORAL | Status: DC

## 2013-07-31 NOTE — Telephone Encounter (Signed)
Please advise 

## 2013-08-01 ENCOUNTER — Other Ambulatory Visit: Payer: Self-pay | Admitting: *Deleted

## 2013-08-01 MED ORDER — OSELTAMIVIR PHOSPHATE 75 MG PO CAPS: 75.0000 mg | ORAL_CAPSULE | Freq: Two times a day (BID) | ORAL | Status: DC

## 2013-08-02 HISTORY — PX: COLONOSCOPY: SHX174

## 2013-08-25 ENCOUNTER — Encounter: Payer: Self-pay | Admitting: Internal Medicine

## 2013-08-27 ENCOUNTER — Encounter: Payer: Self-pay | Admitting: Internal Medicine

## 2013-08-27 ENCOUNTER — Ambulatory Visit (INDEPENDENT_AMBULATORY_CARE_PROVIDER_SITE_OTHER): Payer: Medicare Other | Admitting: Internal Medicine

## 2013-08-27 VITALS — BP 150/80 | HR 81 | Temp 99.0°F | Wt 268.6 lb

## 2013-08-27 DIAGNOSIS — J209 Acute bronchitis, unspecified: Secondary | ICD-10-CM | POA: Diagnosis not present

## 2013-08-27 MED ORDER — AMOXICILLIN 500 MG PO CAPS: 500.0000 mg | ORAL_CAPSULE | Freq: Three times a day (TID) | ORAL | Status: DC

## 2013-08-27 NOTE — Progress Notes (Signed)
Pre visit review using our clinic review tool, if applicable. No additional management support is needed unless otherwise documented below in the visit note. 

## 2013-08-27 NOTE — Patient Instructions (Signed)

## 2013-08-27 NOTE — Progress Notes (Signed)
   Subjective:    Patient ID: Morgan Huff, female    DOB: 1945-11-26, 68 y.o.   MRN: [id number]  HPI   Symptoms began 07/31/13 after exposure to sick visitors. Initial symptoms were chest congestion and nonproductive cough. She also had some sneezing .  She has had associated wheezing with the cough; this has improved.  As of 1/23 she developed laryngitis. The cough has persisted and is now productive of white thick sputum  She has some postnasal drainage as well which is yellow as of 1 week ago.  Rx: Tamiflu X 10 days   Review of Systems  No significant arthralgias & myalgias.  She had a low-grade fever early on but not at this time. She also denies significant shortness of breath.  She has no frontal sinus pain, facial pain,  otic pain, otic discharge, dental pain, or sore throat.      Objective:   Physical Exam General appearance:good health ;well nourished; no acute distress or increased work of breathing is present.  No  lymphadenopathy about the head, neck, or axilla noted.   Eyes: No conjunctival inflammation or lid edema is present.   Ears:  External ear exam shows no significant lesions or deformities.  Otoscopic examination reveals clear canals, tympanic membranes are intact bilaterally without bulging, retraction, inflammation or discharge.  Nose:  External nasal examination shows no deformity or inflammation. Nasal mucosa are pink and moist without lesions or exudates. No septal dislocation or deviation.No obstruction to airflow.   Oral exam: Dental hygiene is good; lips and gums are healthy appearing.There is no oropharyngeal erythema or exudate noted. Hoarse  Neck:  No deformities,  masses, or tenderness noted.    Heart:  Normal rate and regular rhythm. S1 and S2 normal without gallop, murmur, click, rub or other extra sounds.   Lungs:Chest clear to auscultation; no wheezes, rhonchi,rales ,or rubs present.No increased work of breathing.    Extremities:  No  cyanosis, edema, or clubbing  noted    Skin: Warm & dry         Assessment & Plan:  #1 acute bronchitis w/o bronchospasm #2 URI, acute w/o definite sinusitis Plan: See orders and recommendations

## 2013-10-11 DIAGNOSIS — Z1231 Encounter for screening mammogram for malignant neoplasm of breast: Secondary | ICD-10-CM | POA: Diagnosis not present

## 2013-10-16 DIAGNOSIS — Z86010 Personal history of colonic polyps: Secondary | ICD-10-CM | POA: Diagnosis not present

## 2013-10-16 DIAGNOSIS — K5730 Diverticulosis of large intestine without perforation or abscess without bleeding: Secondary | ICD-10-CM | POA: Diagnosis not present

## 2013-10-16 DIAGNOSIS — Z09 Encounter for follow-up examination after completed treatment for conditions other than malignant neoplasm: Secondary | ICD-10-CM | POA: Diagnosis not present

## 2013-10-23 ENCOUNTER — Encounter: Payer: Self-pay | Admitting: Internal Medicine

## 2013-11-12 DIAGNOSIS — H409 Unspecified glaucoma: Secondary | ICD-10-CM | POA: Diagnosis not present

## 2013-11-12 DIAGNOSIS — H401390 Pigmentary glaucoma, unspecified eye, stage unspecified: Secondary | ICD-10-CM | POA: Diagnosis not present

## 2013-11-26 DIAGNOSIS — H409 Unspecified glaucoma: Secondary | ICD-10-CM | POA: Diagnosis not present

## 2013-11-26 DIAGNOSIS — H401390 Pigmentary glaucoma, unspecified eye, stage unspecified: Secondary | ICD-10-CM | POA: Diagnosis not present

## 2013-11-26 DIAGNOSIS — Z961 Presence of intraocular lens: Secondary | ICD-10-CM | POA: Diagnosis not present

## 2013-12-31 ENCOUNTER — Encounter: Payer: Self-pay | Admitting: Internal Medicine

## 2014-01-14 DIAGNOSIS — H409 Unspecified glaucoma: Secondary | ICD-10-CM | POA: Diagnosis not present

## 2014-01-14 DIAGNOSIS — H401390 Pigmentary glaucoma, unspecified eye, stage unspecified: Secondary | ICD-10-CM | POA: Diagnosis not present

## 2014-04-02 DIAGNOSIS — Z01419 Encounter for gynecological examination (general) (routine) without abnormal findings: Secondary | ICD-10-CM | POA: Diagnosis not present

## 2014-04-09 ENCOUNTER — Ambulatory Visit (INDEPENDENT_AMBULATORY_CARE_PROVIDER_SITE_OTHER): Payer: Medicare Other | Admitting: Internal Medicine

## 2014-04-09 ENCOUNTER — Other Ambulatory Visit (INDEPENDENT_AMBULATORY_CARE_PROVIDER_SITE_OTHER): Payer: Medicare Other

## 2014-04-09 ENCOUNTER — Encounter: Payer: Self-pay | Admitting: Internal Medicine

## 2014-04-09 VITALS — BP 130/88 | HR 76 | Temp 98.6°F | Ht 74.0 in | Wt 273.0 lb

## 2014-04-09 DIAGNOSIS — R7309 Other abnormal glucose: Secondary | ICD-10-CM | POA: Diagnosis not present

## 2014-04-09 DIAGNOSIS — R030 Elevated blood-pressure reading, without diagnosis of hypertension: Secondary | ICD-10-CM

## 2014-04-09 DIAGNOSIS — Z860100 Personal history of colon polyps, unspecified: Secondary | ICD-10-CM

## 2014-04-09 DIAGNOSIS — E785 Hyperlipidemia, unspecified: Secondary | ICD-10-CM

## 2014-04-09 LAB — CBC WITH DIFFERENTIAL/PLATELET
Basophils Absolute: 0 10*3/uL (ref 0.0–0.1)
Basophils Relative: 0.6 % (ref 0.0–3.0)
Eosinophils Absolute: 0.2 10*3/uL (ref 0.0–0.7)
Eosinophils Relative: 2.9 % (ref 0.0–5.0)
HCT: 45.6 % (ref 36.0–46.0)
Hemoglobin: 15.3 g/dL — ABNORMAL HIGH (ref 12.0–15.0)
Lymphocytes Relative: 26.5 % (ref 12.0–46.0)
Lymphs Abs: 2 10*3/uL (ref 0.7–4.0)
MCHC: 33.5 g/dL (ref 30.0–36.0)
MCV: 90 fl (ref 78.0–100.0)
MONOS PCT: 10 % (ref 3.0–12.0)
Monocytes Absolute: 0.8 10*3/uL (ref 0.1–1.0)
Neutro Abs: 4.5 10*3/uL (ref 1.4–7.7)
Neutrophils Relative %: 60 % (ref 43.0–77.0)
PLATELETS: 278 10*3/uL (ref 150.0–400.0)
RBC: 5.07 Mil/uL (ref 3.87–5.11)
RDW: 14.1 % (ref 11.5–15.5)
WBC: 7.5 10*3/uL (ref 4.0–10.5)

## 2014-04-09 LAB — BASIC METABOLIC PANEL
BUN: 21 mg/dL (ref 6–23)
CO2: 26 mEq/L (ref 19–32)
CREATININE: 0.9 mg/dL (ref 0.4–1.2)
Calcium: 9.6 mg/dL (ref 8.4–10.5)
Chloride: 107 mEq/L (ref 96–112)
GFR: 63.64 mL/min (ref >60.00)
Glucose, Bld: 100 mg/dL — ABNORMAL HIGH (ref 70–99)
POTASSIUM: 4.5 meq/L (ref 3.5–5.1)
Sodium: 141 mEq/L (ref 135–145)

## 2014-04-09 LAB — HEMOGLOBIN A1C: HEMOGLOBIN A1C: 6.1 % (ref 4.6–6.5)

## 2014-04-09 LAB — HEPATIC FUNCTION PANEL
ALT: 18 U/L (ref 0–35)
AST: 19 U/L (ref 0–37)
Albumin: 3.9 g/dL (ref 3.5–5.2)
Alkaline Phosphatase: 55 U/L (ref 39–117)
Bilirubin, Direct: 0.1 mg/dL (ref 0.0–0.3)
Total Bilirubin: 0.4 mg/dL (ref 0.2–1.2)
Total Protein: 7.4 g/dL (ref 6.0–8.3)

## 2014-04-09 LAB — TSH: TSH: 2.22 u[IU]/mL (ref 0.35–4.50)

## 2014-04-09 LAB — LIPID PANEL
Cholesterol: 142 mg/dL (ref 0–200)
HDL: 44.3 mg/dL (ref >39.00)
LDL Cholesterol: 77 mg/dL (ref 0–99)
NonHDL: 97.7
Total CHOL/HDL Ratio: 3
Triglycerides: 105 mg/dL (ref 0.0–149.0)
VLDL: 21 mg/dL (ref 0.0–40.0)

## 2014-04-09 NOTE — Assessment & Plan Note (Signed)
Blood pressure goals reviewed. BMET 

## 2014-04-09 NOTE — Progress Notes (Signed)
Pre visit review using our clinic review tool, if applicable. No additional management support is needed unless otherwise documented below in the visit note. 

## 2014-04-09 NOTE — Progress Notes (Signed)
Subjective:    Patient ID: Morgan Huff, female    DOB: November 11, 1945, 68 y.o.   MRN: [id number]  HPI UHC/ Medicare Wellness Visit: Psychosocial and medical history were reviewed as required by Medicare (history related to abuse, antisocial behavior , firearm risk). Social history: Caffeine:no  , Alcohol: rarely , Tobacco WUJ:WJXBJ Exercise:none Personal safety/fall risk:no Limitations of activities of daily living:no Seatbelt/ smoke alarm use:yes Healthcare Power of Attorney/Living Will status:  Ophthalmologic exam status: Hearing evaluation status: not UTD Orientation: Oriented X 3 Memory and recall: good Spelling testing: good Depression/anxiety assessment: no Foreign travel history: 2005 [place] Immunization status for influenza/pneumonia/ shingles /tetanus:Flu pending Transfusion history:no Preventive health care maintenance status: Colonoscopy/BMD/mammogram/Pap as per protocol/standard care: all UTD Dental care:every 6 mos Chart reviewed and updated. Active issues reviewed and addressed as documented below.    A heart healthy diet is followed.  Family history is negative for premature coronary disease. Advanced cholesterol testing reveals  LDL goal is less than 100 ; ideally < 70 . There is medication compliance with the statin.  Low dose ASA taken   Review of Systems  Specifically denied are  chest pain, palpitations, dyspnea, or claudication.  Significant abdominal symptoms, memory deficit, or myalgias not present.  She describes a raspy voice without definite extrinsic symptoms or reflux symptoms. She is on Biotene for dry throat. She also uses Sustane for dry eyes. She does not believe that she's had a Schirmer's test by Dr. Delfina Redwood Ophth here in  [place].  She describes some weakness in both lower extremities, stating that she must push off with her arms. She has no significant joint symptoms.       Objective:   Physical Exam Gen.: Healthy and well-nourished  in appearance. Alert, appropriate and cooperative throughout exam. Appears younger than stated age  Head: Normocephalic without obvious abnormalities  Eyes: No corneal or conjunctival inflammation noted. Pupils equal round reactive to light and accommodation. Extraocular motion intact. Ears: External  ear exam reveals no significant lesions or deformities. Canals clear .TMs normal. Hearing is grossly normal bilaterally. Nose: External nasal exam reveals no deformity or inflammation. Nasal mucosa are pink and moist. No lesions or exudates noted.   Mouth: Oral mucosa and oropharynx reveal no lesions or exudates. Teeth in good repair.raspy voice.large tonsils, R > L. Neck: No deformities, masses, or tenderness noted. Range of motion &. Thyroid normal. Lungs: Normal respiratory effort; chest expands symmetrically. Lungs are clear to auscultation without rales, wheezes, or increased work of breathing. Heart: Normal rate and rhythm. Normal S1 and S2. No gallop, click, or rub. S4 w/o murmur. Abdomen: Protuberant.Bowel sounds normal; abdomen soft and nontender. No masses, organomegaly or hernias noted. Genitalia: as per Gyn                                  Musculoskeletal/extremities: No deformity or scoliosis noted of  the thoracic or lumbar spine.  No clubbing, cyanosis,  or significant extremity  deformity noted. Trace -1/2+ edema.Range of motion normal .Tone & strength normal. Hand joints normal  Fingernail  health good. Able to lie down & sit up w/o help. Negative SLR bilaterally Vascular: Carotid, radial artery, dorsalis pedis and  posterior tibial pulses are full and equal. No bruits present.Varicose veins of LE Neurologic: Alert and oriented x3. Deep tendon reflexes symmetrical and normal.  Gait normal.      Skin: Intact without suspicious lesions or rashes.  Lymph: No cervical, axillary, or inguinal lymphadenopathy present. Psych: Mood and affect are normal. Normally interactive                                                                                         Assessment & Plan:  #1 UHC/Medicare Wellness Exam; criteria met ; data entered #2 See Current Assessment & Plan in Problem List under specific DiagnosisThe labs will be reviewed and risks and options assessed. Written recommendations will be provided by mail or directly through My Chart.Further evaluation or change in medical therapy will be directed by those results. #3 dry eyes and dry mouth; she's been asked to check with Dr. Delfin Gant nurse to see if Schirmer's test has been completed. Connective tissue disorder such as scleroderma must be considered.

## 2014-04-09 NOTE — Assessment & Plan Note (Signed)
A1c

## 2014-04-09 NOTE — Assessment & Plan Note (Addendum)
Lipids, LFTs, TSH  

## 2014-04-09 NOTE — Patient Instructions (Signed)
Your next office appointment will be determined based upon review of your pending labs . Those instructions will be transmitted to you through My Chart  Reflux of gastric acid may be asymptomatic as this may occur mainly during sleep.The triggers for reflux  include stress; the "aspirin family" ; alcohol; peppermint; and caffeine (coffee, tea, cola, and chocolate). The aspirin family would include aspirin and the nonsteroidal agents such as ibuprofen &  Naproxen. Tylenol would not cause reflux. If having symptoms ; food & drink should be avoided for @ least 2 hours before going to bed.

## 2014-04-12 ENCOUNTER — Encounter: Payer: Self-pay | Admitting: Internal Medicine

## 2014-04-12 ENCOUNTER — Other Ambulatory Visit: Payer: Self-pay | Admitting: *Deleted

## 2014-04-12 MED ORDER — SIMVASTATIN 20 MG PO TABS: ORAL_TABLET | ORAL | Status: DC

## 2014-04-12 NOTE — Telephone Encounter (Signed)
Pt sent email requesting simvastatin to [facility]...Morgan Huff

## 2014-05-20 DIAGNOSIS — N812 Incomplete uterovaginal prolapse: Secondary | ICD-10-CM | POA: Diagnosis not present

## 2014-05-21 DIAGNOSIS — N812 Incomplete uterovaginal prolapse: Secondary | ICD-10-CM | POA: Diagnosis not present

## 2014-06-10 DIAGNOSIS — D1801 Hemangioma of skin and subcutaneous tissue: Secondary | ICD-10-CM | POA: Diagnosis not present

## 2014-06-10 DIAGNOSIS — D692 Other nonthrombocytopenic purpura: Secondary | ICD-10-CM | POA: Diagnosis not present

## 2014-06-10 DIAGNOSIS — L720 Epidermal cyst: Secondary | ICD-10-CM | POA: Diagnosis not present

## 2014-06-10 DIAGNOSIS — L821 Other seborrheic keratosis: Secondary | ICD-10-CM | POA: Diagnosis not present

## 2014-06-10 DIAGNOSIS — L905 Scar conditions and fibrosis of skin: Secondary | ICD-10-CM | POA: Diagnosis not present

## 2014-06-10 DIAGNOSIS — L718 Other rosacea: Secondary | ICD-10-CM | POA: Diagnosis not present

## 2014-07-15 DIAGNOSIS — H401331 Pigmentary glaucoma, bilateral, mild stage: Secondary | ICD-10-CM | POA: Diagnosis not present

## 2014-09-05 ENCOUNTER — Telehealth: Payer: Self-pay

## 2014-09-05 NOTE — Telephone Encounter (Signed)
Opened in error

## 2014-10-17 DIAGNOSIS — Z803 Family history of malignant neoplasm of breast: Secondary | ICD-10-CM | POA: Diagnosis not present

## 2014-10-17 DIAGNOSIS — Z1231 Encounter for screening mammogram for malignant neoplasm of breast: Secondary | ICD-10-CM | POA: Diagnosis not present

## 2014-10-17 LAB — HM COLONOSCOPY

## 2014-10-18 ENCOUNTER — Encounter: Payer: Self-pay | Admitting: Internal Medicine

## 2014-11-07 ENCOUNTER — Encounter: Payer: Self-pay | Admitting: Internal Medicine

## 2014-11-29 DIAGNOSIS — H401331 Pigmentary glaucoma, bilateral, mild stage: Secondary | ICD-10-CM | POA: Diagnosis not present

## 2014-12-11 DIAGNOSIS — Z961 Presence of intraocular lens: Secondary | ICD-10-CM | POA: Diagnosis not present

## 2014-12-11 DIAGNOSIS — H401331 Pigmentary glaucoma, bilateral, mild stage: Secondary | ICD-10-CM | POA: Diagnosis not present

## 2014-12-11 DIAGNOSIS — H04123 Dry eye syndrome of bilateral lacrimal glands: Secondary | ICD-10-CM | POA: Diagnosis not present

## 2015-02-18 ENCOUNTER — Encounter: Payer: Self-pay | Admitting: Internal Medicine

## 2015-02-18 ENCOUNTER — Ambulatory Visit (INDEPENDENT_AMBULATORY_CARE_PROVIDER_SITE_OTHER): Payer: Medicare Other | Admitting: Internal Medicine

## 2015-02-18 VITALS — BP 142/84 | HR 91 | Temp 98.4°F | Resp 16 | Wt 269.0 lb

## 2015-02-18 DIAGNOSIS — M5432 Sciatica, left side: Secondary | ICD-10-CM | POA: Diagnosis not present

## 2015-02-18 MED ORDER — GABAPENTIN 100 MG PO CAPS: ORAL_CAPSULE | ORAL | Status: DC

## 2015-02-18 NOTE — Progress Notes (Signed)
   Subjective:    Patient ID: Morgan Huff, female    DOB: 17-Jan-1946, 69 y.o.   MRN: [id number]  HPI  She has had chronic right hip pain for years. Since mid June she's begun to have pain in the left posterior hip area which radiates down the posterior thigh to the knee level. There was no trigger or injury for this. She describes a strong ache and sharp stabbing sensation which is now becoming constant. She's taken up to 3 200 mg Advil for relief. She also found tramadol of benefit. Heat has been applied with some relief as well.  In the last 2 days she's had some pulling sensation across the midabdomen. She wonders whether this is related to uterine prolapse. Her gynecologist has proposed a total abdominal hysterectomy  She has some weakness in her legs and muscle pain. She has urge incontinence.  Review of Systems She denies fever, chills, sweats, unexplained weight loss.  She has no associated cranial nerve issues such as blurred vision, double vision, or loss of vision. She also denies headaches or syncope.  She has no chest symptoms of pain, tachycardia, palpitations, or dyspnea.  The abdominal discomfort is not associated with melela, rectal bleeding, or change in stools.  She denies dysuria, pyuria, or hematuria.  She has no associated numbness or tingling in her extremities.  There's been no rash or change in color or temperature of the skin in the area the symptoms.    Objective:   Physical Exam  Pertinent or positive findings include: She rises from the chair slowly and has a broad deliberate gait. She is able to walk on her heels and toes. S4 is present. Abdomen protuberant. Crepitus of the knees is noted. Straight leg raising is negative bilaterally. She is able to lie flat and sit up without help. 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  General appearance :adequately nourished; in no  distress.  Eyes: No conjunctival inflammation or scleral icterus is present.  Oral exam:  Lips and gums are healthy appearing.There is no oropharyngeal erythema or exudate noted. Dental hygiene is good.  Heart:  Normal rate and regular rhythm. S1 and S2 normal without gallop, murmur, click, rub or other extra sounds    Lungs:Chest clear to auscultation; no wheezes, rhonchi,rales ,or rubs present.No increased work of breathing.   Abdomen: bowel sounds normal, soft and non-tender without masses, organomegaly or hernias noted.  No guarding or rebound. No flank tenderness to percussion.  Vascular : all pulses equal ; no bruits present.  Skin:Warm & dry.  Intact without suspicious lesions or rashes ; no tenting or jaundice   Lymphatic: No lymphadenopathy is noted about the head, neck, axilla  Neuro: Strength, tone & DTRs normal.        Assessment & Plan:  #1 sciatica, S2 radiculopathy.  Plan see orders recommendations

## 2015-02-18 NOTE — Patient Instructions (Addendum)
Assess response to the gabapentin one at bedtime as needed. If it is partially beneficial, it can be increased up to a total of 3 pills  as needed. This increase of 1 pill each dose  should take place over 72 hours at least.If 300 mg is effective dose ; there is a 300 mg pill.  The best exercises for the low back include freestyle swimming, stretch aerobics, and yoga.

## 2015-02-18 NOTE — Progress Notes (Signed)
Pre visit review using our clinic review tool, if applicable. No additional management support is needed unless otherwise documented below in the visit note. 

## 2015-02-21 DIAGNOSIS — Z961 Presence of intraocular lens: Secondary | ICD-10-CM | POA: Diagnosis not present

## 2015-02-22 ENCOUNTER — Encounter: Payer: Self-pay | Admitting: Internal Medicine

## 2015-03-03 DIAGNOSIS — H401331 Pigmentary glaucoma, bilateral, mild stage: Secondary | ICD-10-CM | POA: Diagnosis not present

## 2015-03-24 ENCOUNTER — Telehealth: Payer: Self-pay

## 2015-03-24 NOTE — Telephone Encounter (Signed)
Call to educate on AWV prior to medical apt on 9/9; LVM for call back

## 2015-03-26 NOTE — Telephone Encounter (Signed)
Patient called back regarding AWV; would not mind coming in but asked if there would be a copay, as she generally gets her CPE and AWV with no copay; Stated due to differences in medicare plans; I would prefer she go to Dr. Linna Darner for AWV and CPE, as some pay for preventive exam and some do not. I explained it would be fine for Dr. Linna Darner to complete her AWV this year and will defer to next year or she can ask him via Mychart. Stated that AWV is dependent on  needs and did review her need for Prevnar and did mention Hep C antibody when she comes in.  Will defer to Dr. Linna Darner unless I hear from her.

## 2015-04-09 ENCOUNTER — Other Ambulatory Visit: Payer: Self-pay | Admitting: Obstetrics and Gynecology

## 2015-04-09 DIAGNOSIS — N812 Incomplete uterovaginal prolapse: Secondary | ICD-10-CM | POA: Diagnosis not present

## 2015-04-09 DIAGNOSIS — Z01419 Encounter for gynecological examination (general) (routine) without abnormal findings: Secondary | ICD-10-CM | POA: Diagnosis not present

## 2015-04-09 DIAGNOSIS — Z124 Encounter for screening for malignant neoplasm of cervix: Secondary | ICD-10-CM | POA: Diagnosis not present

## 2015-04-09 DIAGNOSIS — Z6835 Body mass index (BMI) 35.0-35.9, adult: Secondary | ICD-10-CM | POA: Diagnosis not present

## 2015-04-11 ENCOUNTER — Ambulatory Visit (INDEPENDENT_AMBULATORY_CARE_PROVIDER_SITE_OTHER): Payer: Medicare Other | Admitting: Internal Medicine

## 2015-04-11 ENCOUNTER — Encounter: Payer: Self-pay | Admitting: Internal Medicine

## 2015-04-11 ENCOUNTER — Telehealth: Payer: Self-pay | Admitting: Internal Medicine

## 2015-04-11 ENCOUNTER — Other Ambulatory Visit (INDEPENDENT_AMBULATORY_CARE_PROVIDER_SITE_OTHER): Payer: Medicare Other

## 2015-04-11 VITALS — BP 128/88 | HR 94 | Temp 98.5°F | Resp 16 | Ht 74.0 in | Wt 270.0 lb

## 2015-04-11 DIAGNOSIS — Z860100 Personal history of colon polyps, unspecified: Secondary | ICD-10-CM

## 2015-04-11 DIAGNOSIS — Z0000 Encounter for general adult medical examination without abnormal findings: Secondary | ICD-10-CM | POA: Diagnosis not present

## 2015-04-11 DIAGNOSIS — R739 Hyperglycemia, unspecified: Secondary | ICD-10-CM

## 2015-04-11 DIAGNOSIS — Z23 Encounter for immunization: Secondary | ICD-10-CM | POA: Diagnosis not present

## 2015-04-11 DIAGNOSIS — E785 Hyperlipidemia, unspecified: Secondary | ICD-10-CM

## 2015-04-11 LAB — HEMOGLOBIN A1C: HEMOGLOBIN A1C: 5.9 % (ref 4.6–6.5)

## 2015-04-11 LAB — CBC WITH DIFFERENTIAL/PLATELET
BASOS PCT: 0.3 % (ref 0.0–3.0)
Basophils Absolute: 0 10*3/uL (ref 0.0–0.1)
EOS ABS: 0.2 10*3/uL (ref 0.0–0.7)
EOS PCT: 3 % (ref 0.0–5.0)
HEMATOCRIT: 45.3 % (ref 36.0–46.0)
HEMOGLOBIN: 15.3 g/dL — AB (ref 12.0–15.0)
Lymphocytes Relative: 25.3 % (ref 12.0–46.0)
Lymphs Abs: 2.1 10*3/uL (ref 0.7–4.0)
MCHC: 33.7 g/dL (ref 30.0–36.0)
MCV: 89.2 fl (ref 78.0–100.0)
MONO ABS: 0.8 10*3/uL (ref 0.1–1.0)
Monocytes Relative: 9.9 % (ref 3.0–12.0)
NEUTROS ABS: 5.1 10*3/uL (ref 1.4–7.7)
Neutrophils Relative %: 61.5 % (ref 43.0–77.0)
PLATELETS: 294 10*3/uL (ref 150.0–400.0)
RBC: 5.08 Mil/uL (ref 3.87–5.11)
RDW: 13.5 % (ref 11.5–15.5)
WBC: 8.3 10*3/uL (ref 4.0–10.5)

## 2015-04-11 LAB — BASIC METABOLIC PANEL
BUN: 22 mg/dL (ref 6–23)
CHLORIDE: 107 meq/L (ref 96–112)
CO2: 29 meq/L (ref 19–32)
CREATININE: 0.9 mg/dL (ref 0.40–1.20)
Calcium: 9.7 mg/dL (ref 8.4–10.5)
GFR: 65.89 mL/min (ref >60.00)
Glucose, Bld: 103 mg/dL — ABNORMAL HIGH (ref 70–99)
POTASSIUM: 4.4 meq/L (ref 3.5–5.1)
Sodium: 142 mEq/L (ref 135–145)

## 2015-04-11 LAB — HEPATIC FUNCTION PANEL
ALT: 13 U/L (ref 0–35)
AST: 16 U/L (ref 0–37)
Albumin: 4.2 g/dL (ref 3.5–5.2)
Alkaline Phosphatase: 69 U/L (ref 39–117)
BILIRUBIN DIRECT: 0.1 mg/dL (ref 0.0–0.3)
TOTAL PROTEIN: 7.3 g/dL (ref 6.0–8.3)
Total Bilirubin: 0.6 mg/dL (ref 0.2–1.2)

## 2015-04-11 LAB — LIPID PANEL
CHOL/HDL RATIO: 3
CHOLESTEROL: 165 mg/dL (ref 0–200)
HDL: 47.8 mg/dL (ref >39.00)
LDL Cholesterol: 92 mg/dL (ref 0–99)
NonHDL: 117.01
TRIGLYCERIDES: 125 mg/dL (ref 0.0–149.0)
VLDL: 25 mg/dL (ref 0.0–40.0)

## 2015-04-11 LAB — TSH: TSH: 2.39 u[IU]/mL (ref 0.35–4.50)

## 2015-04-11 MED ORDER — SIMVASTATIN 10 MG PO TABS: 10.0000 mg | ORAL_TABLET | Freq: Every day | ORAL | Status: DC

## 2015-04-11 NOTE — Assessment & Plan Note (Signed)
Lipids, LFTs, TSH  

## 2015-04-11 NOTE — Telephone Encounter (Signed)
Patient is requesting to transfer from Dr. Linna Darner and to Dr. Alain Marion once Dr. Levell July.  Patient states she know Dr. Judeen Hammans son.

## 2015-04-11 NOTE — Progress Notes (Signed)
   Subjective:    Patient ID: Morgan Huff, female    DOB: Mar 15, 1946, 69 y.o.   MRN: [id number]  HPI Medicare Wellness Visit: Psychosocial and medical history were reviewed as required by Medicare (history related to abuse, antisocial behavior , firearm risk). Social history: Caffeine: Almost none Alcohol:  Socially only Tobacco use: Never Exercise: No regular program Personal safety/fall risk: No Limitations of activities of daily living: No Seatbelt/ smoke alarm use: Yes Healthcare Power of Attorney/Living Will status and End of Life process assessment : Up-to-date Ophthalmologic exam status: Up-to-date Hearing evaluation status: Not up-to-date Orientation: Oriented X 3 Memory and recall: Good Spelling testing: Excellent Depression/anxiety assessment: No Foreign travel history: 2005, [place] Immunization status for influenza/pneumonia/ shingles Morgan Huff: Prevnar given Transfusion history: Never Preventive health care maintenance status: Colonoscopy/BMD/mammogram/Pap as per protocol/standard care: Up-to-date Dental care: Every 6 months Chart reviewed and updated. Active issues reviewed and addressed as documented below.   She does eat some fried foods; she does not ingest significant amounts of red meat. She does not add salt. Blood pressure at home is 116/72-130/86. She has been compliant with her medicines without adverse effects.  She has chronic orthopnea but not other cardiopulmonary symptoms. She also has a raspy voice without significant reflux. Skin is dry.  She is to have a total abdominal hysterectomy for prolapse. Her symptoms include urgency; incomplete voiding; incontinence; &  nocturia 2-3 times per night.  Review of Systems Chest pain, palpitations, tachycardia, exertional dyspnea, paroxysmal nocturnal dyspnea, claudication or edema are absent. No unexplained weight loss, abdominal pain, significant dyspepsia, dysphagia, melena, rectal bleeding, or persistently  small caliber stools. Dysuria, pyuria, hematuria, frequency,  or polyuria are denied. Change in hair or nails denied. No bowel changes of constipation or diarrhea. No intolerance to heat or cold.    Objective:   Physical Exam Pertinent or positive findings include: She has bilateral ptosis. An S4 is present with intermittent grade 1/2 systolic murmur. She has venous spiders and some venous insufficiency changes of the lower extremities. Diffuse plaquelike keratoses are noted. Crepitus of the knees is present.  General appearance :adequately nourished; in no distress.  Eyes: No conjunctival inflammation or scleral icterus is present.  Oral exam:  Lips and gums are healthy appearing.There is no oropharyngeal erythema or exudate noted. Dental hygiene is good.  Heart:  Normal rate and regular rhythm. S1 and S2 normal without gallop, click,or rub.   Lungs:Chest clear to auscultation; no wheezes, rhonchi,rales ,or rubs present.No increased work of breathing.   Abdomen: bowel sounds normal, soft and non-tender without masses, organomegaly or hernias noted.  No guarding or rebound. No flank tenderness to percussion.  Vascular : all pulses equal ; no bruits present.  Skin:Warm & dry.  Intact without suspicious lesions or rashes ; no tenting or jaundice   Lymphatic: No lymphadenopathy is noted about the head, neck, axilla   Neuro: Strength, tone & DTRs normal.     Assessment & Plan:  #1 Medicare Wellness Exam; criteria met ; data entered #2 See Current Assessment & Plan in Problem List under specific Diagnosis. The labs will be reviewed and risks and options assessed.  Written recommendations will be provided by mail or directly through My Chart. Further evaluation or change in medical therapy will be directed by those results.

## 2015-04-11 NOTE — Progress Notes (Signed)
Pre visit review using our clinic review tool, if applicable. No additional management support is needed unless otherwise documented below in the visit note. 

## 2015-04-11 NOTE — Assessment & Plan Note (Signed)
A1c

## 2015-04-11 NOTE — Patient Instructions (Signed)
  Your next office appointment will be determined based upon review of your pending labs. Those written interpretation of the lab results and instructions will be transmitted to you by My Chart  Critical results will be called.   Followup as needed for any active or acute issue. Please report any significant change in your symptoms. 

## 2015-04-11 NOTE — Assessment & Plan Note (Signed)
CBC

## 2015-04-13 NOTE — Telephone Encounter (Signed)
Ok Thx 

## 2015-04-14 LAB — CYTOLOGY - PAP

## 2015-04-14 NOTE — Telephone Encounter (Signed)
Left patient a vm to call back to schedule appointment

## 2015-05-20 ENCOUNTER — Encounter: Payer: Self-pay | Admitting: Internal Medicine

## 2015-05-26 DIAGNOSIS — N812 Incomplete uterovaginal prolapse: Secondary | ICD-10-CM | POA: Diagnosis not present

## 2015-05-28 ENCOUNTER — Other Ambulatory Visit: Payer: Self-pay

## 2015-05-28 ENCOUNTER — Encounter (HOSPITAL_COMMUNITY): Payer: Self-pay

## 2015-05-28 ENCOUNTER — Encounter (HOSPITAL_COMMUNITY)
Admission: RE | Admit: 2015-05-28 | Discharge: 2015-05-28 | Disposition: A | Discharge status: Home / Self Care | Payer: Medicare Other | Source: Ambulatory Visit | Attending: Obstetrics and Gynecology | Admitting: Obstetrics and Gynecology

## 2015-05-28 DIAGNOSIS — N8110 Cystocele, unspecified: Secondary | ICD-10-CM | POA: Insufficient documentation

## 2015-05-28 DIAGNOSIS — Z01818 Encounter for other preprocedural examination: Secondary | ICD-10-CM | POA: Insufficient documentation

## 2015-05-28 DIAGNOSIS — N816 Rectocele: Secondary | ICD-10-CM | POA: Insufficient documentation

## 2015-05-28 LAB — CBC
HCT: 44.9 % (ref 36.0–46.0)
Hemoglobin: 14.8 g/dL (ref 12.0–15.0)
MCH: 30.1 pg (ref 26.0–34.0)
MCHC: 33 g/dL (ref 30.0–36.0)
MCV: 91.3 fL (ref 78.0–100.0)
PLATELETS: 287 10*3/uL (ref 150–400)
RBC: 4.92 MIL/uL (ref 3.87–5.11)
RDW: 13.9 % (ref 11.5–15.5)
WBC: 8.8 10*3/uL (ref 4.0–10.5)

## 2015-05-28 MED ORDER — LIDOCAINE HCL (CARDIAC) 20 MG/ML IV SOLN
INTRAVENOUS | Status: AC
Start: 2015-05-28 — End: 2015-05-28
  Filled 2015-05-28: qty 15

## 2015-05-28 NOTE — Patient Instructions (Addendum)
   Your procedure is scheduled on: NOV 3 (THURSDAY) AT 730AM   Enter through the Main Entrance of [facility] at: 6AM  Pick up the phone at the desk and dial [phone] and inform us of your arrival.  Please call this number if you have any problems the morning of surgery: [phone]  DO NOT EAT OR DRINK AFTER MIDNIGHT NOV 2 (WEDNESDAY)  Take these medicines the morning of surgery with a SIP OF WATER: TAKE ZOCOR AND EYE DROPS AT BEDTIME NIGHT BEFORE SURGERY Do not wear jewelry, make-up, or FINGER nail polish No metal in your hair or on your body. Do not wear lotions, powders, perfumes.  You may wear deodorant.  Do not bring valuables to the hospital. Contacts, dentures or bridgework may not be worn into surgery.  Leave suitcase in the car. After Surgery it may be brought to your room. For patients being admitted to the hospital, checkout time is 11:00am the day of discharge.

## 2015-06-02 NOTE — H&P (Addendum)
  S:  Morgan Huff presents today for preop evaluation for symptomatic pelvic relaxation and prolapse.  Patient has complete uterine prolapse.  Often when she stands or sneezes, her whole uterus will come out.  She needs to put it back in in order to void.  It does fall out on a regular basis.  She denies any leaking of urine, but it has gotten to point this is very troubling for her with complete procidentia.  She presents for definitive surgical intervention.  She has tried a pessary with minimal success in the past and again denies any leaking of urine with coughing or sneezing.     O:  Physical exam:  Heart is regular rate and rhythm.  Lungs are clear to auscultation bilaterally.  Abdomen is soft, nontender, nondistended.  Pelvic exam:  Complete uterine prolapse noted.  Pap smear has been normal and was performed.  We can relapse it easily but again with valsalva it easily prolapses again.  She does have fairly good support of the urethra.  PAST MEDICAL HISTORY:  History of elevated cholesterol, mildly elevated glucose, history of varicose veins, diverticulosis, glaucoma, history of plantar fasciitis.   MEDICATIONS:  Include Zocor, medications for her glaucoma, vitamin D, and intermittent pain medicine she uses for her lower back.   LABS:   She has a recent laboratory evaluation which is normal per Dr. Linna Darner.   A:   Symptomatic uterine prolapse and procidentia with cystocele, rectocele.   P:  Laparoscopically-assisted vaginal hysterectomy, bilateral salpingo-oophorectomy, anterior and posterior colporrhaphy with sacrospinous ligament suspension.  We discussed the procedure at length, its risks, its benefits, its pros, its cons, success and complication rate.  Discussed the procedure, its recovery.  The risks include but not limited to risk of infection, bleeding, damage to the bowel, bladder, ureter, risks with anesthesia, blood transfusion, prolonged catheter wear, incontinence or urinary retention.   All of her questions were answered, and she has given informed consent.   Louretta Shorten, MD/[id number]  06/05/15 0715 This patient has been seen and examined.   All of her questions were answered.  Labs and vital signs reviewed.  Informed consent has been obtained.  The History and Physical is current.This patient has been seen and examined.   All of her questions were answered.  Labs and vital signs reviewed.  Informed consent has been obtained.  The History and Physical is current. DL

## 2015-06-04 DIAGNOSIS — I8392 Asymptomatic varicose veins of left lower extremity: Secondary | ICD-10-CM | POA: Diagnosis not present

## 2015-06-04 DIAGNOSIS — I8391 Asymptomatic varicose veins of right lower extremity: Secondary | ICD-10-CM | POA: Diagnosis not present

## 2015-06-04 DIAGNOSIS — L814 Other melanin hyperpigmentation: Secondary | ICD-10-CM | POA: Diagnosis not present

## 2015-06-04 DIAGNOSIS — D1801 Hemangioma of skin and subcutaneous tissue: Secondary | ICD-10-CM | POA: Diagnosis not present

## 2015-06-04 DIAGNOSIS — L821 Other seborrheic keratosis: Secondary | ICD-10-CM | POA: Diagnosis not present

## 2015-06-04 DIAGNOSIS — L817 Pigmented purpuric dermatosis: Secondary | ICD-10-CM | POA: Diagnosis not present

## 2015-06-04 MED ORDER — DEXTROSE 5 % IV SOLN
2.0000 g | INTRAVENOUS | Status: AC
  Administered 2015-06-05: 2 g via INTRAVENOUS
  Filled 2015-06-04: qty 2

## 2015-06-05 ENCOUNTER — Encounter (HOSPITAL_COMMUNITY): Payer: Self-pay

## 2015-06-05 ENCOUNTER — Inpatient Hospital Stay (HOSPITAL_COMMUNITY)
Admission: RE | Admit: 2015-06-05 | Discharge: 2015-06-07 | DRG: 743 | Disposition: A | Discharge status: Home / Self Care | Payer: Medicare Other | Source: Ambulatory Visit | Attending: Obstetrics and Gynecology | Admitting: Obstetrics and Gynecology

## 2015-06-05 ENCOUNTER — Encounter (HOSPITAL_COMMUNITY)
Admission: RE | Disposition: A | Discharge status: Home / Self Care | Payer: Self-pay | Source: Ambulatory Visit | Attending: Obstetrics and Gynecology

## 2015-06-05 ENCOUNTER — Inpatient Hospital Stay (HOSPITAL_COMMUNITY): Payer: Medicare Other | Admitting: Anesthesiology

## 2015-06-05 DIAGNOSIS — N83201 Unspecified ovarian cyst, right side: Secondary | ICD-10-CM | POA: Diagnosis present

## 2015-06-05 DIAGNOSIS — N813 Complete uterovaginal prolapse: Principal | ICD-10-CM | POA: Diagnosis present

## 2015-06-05 DIAGNOSIS — Z7982 Long term (current) use of aspirin: Secondary | ICD-10-CM | POA: Diagnosis not present

## 2015-06-05 DIAGNOSIS — D251 Intramural leiomyoma of uterus: Secondary | ICD-10-CM | POA: Diagnosis not present

## 2015-06-05 DIAGNOSIS — N814 Uterovaginal prolapse, unspecified: Secondary | ICD-10-CM | POA: Diagnosis not present

## 2015-06-05 DIAGNOSIS — N816 Rectocele: Secondary | ICD-10-CM | POA: Diagnosis not present

## 2015-06-05 DIAGNOSIS — N8110 Cystocele, unspecified: Secondary | ICD-10-CM | POA: Diagnosis not present

## 2015-06-05 DIAGNOSIS — H409 Unspecified glaucoma: Secondary | ICD-10-CM | POA: Diagnosis present

## 2015-06-05 HISTORY — PX: ANTERIOR AND POSTERIOR REPAIR WITH SACROSPINOUS FIXATION: SHX6536

## 2015-06-05 SURGERY — HYSTERECTOMY, VAGINAL, LAPAROSCOPY-ASSISTED, WITH SALPINGO-OOPHORECTOMY
Anesthesia: General | Site: Vagina

## 2015-06-05 MED ORDER — PROMETHAZINE HCL 25 MG/ML IJ SOLN
INTRAMUSCULAR | Status: AC
  Administered 2015-06-05: 6.25 mg via INTRAVENOUS
  Filled 2015-06-05: qty 1

## 2015-06-05 MED ORDER — PROPOFOL 10 MG/ML IV BOLUS
INTRAVENOUS | Status: DC | PRN
  Administered 2015-06-05: 150 mg via INTRAVENOUS

## 2015-06-05 MED ORDER — FENTANYL CITRATE (PF) 250 MCG/5ML IJ SOLN
INTRAMUSCULAR | Status: AC
  Filled 2015-06-05: qty 25

## 2015-06-05 MED ORDER — HYDROMORPHONE HCL 1 MG/ML IJ SOLN: 0.2000 mg | INTRAMUSCULAR | Status: DC | PRN

## 2015-06-05 MED ORDER — FENTANYL CITRATE (PF) 100 MCG/2ML IJ SOLN
INTRAMUSCULAR | Status: AC
  Administered 2015-06-05: 50 ug via INTRAVENOUS
  Filled 2015-06-05: qty 2

## 2015-06-05 MED ORDER — ROCURONIUM BROMIDE 100 MG/10ML IV SOLN
INTRAVENOUS | Status: AC
  Filled 2015-06-05: qty 1

## 2015-06-05 MED ORDER — DIPHENHYDRAMINE HCL 50 MG/ML IJ SOLN: 12.5000 mg | Freq: Four times a day (QID) | INTRAMUSCULAR | Status: DC | PRN

## 2015-06-05 MED ORDER — DEXAMETHASONE SODIUM PHOSPHATE 10 MG/ML IJ SOLN
INTRAMUSCULAR | Status: DC | PRN
  Administered 2015-06-05: 4 mg via INTRAVENOUS

## 2015-06-05 MED ORDER — MIDAZOLAM HCL 2 MG/2ML IJ SOLN
INTRAMUSCULAR | Status: DC | PRN
  Administered 2015-06-05: 1 mg via INTRAVENOUS

## 2015-06-05 MED ORDER — LIDOCAINE HCL (CARDIAC) 20 MG/ML IV SOLN
INTRAVENOUS | Status: AC
  Filled 2015-06-05: qty 5

## 2015-06-05 MED ORDER — SODIUM CHLORIDE 0.9 % IJ SOLN: 9.0000 mL | INTRAMUSCULAR | Status: DC | PRN

## 2015-06-05 MED ORDER — PROPOFOL 10 MG/ML IV BOLUS
INTRAVENOUS | Status: AC
  Filled 2015-06-05: qty 20

## 2015-06-05 MED ORDER — LIDOCAINE HCL (CARDIAC) 20 MG/ML IV SOLN
INTRAVENOUS | Status: DC | PRN
  Administered 2015-06-05: 50 mg via INTRAVENOUS

## 2015-06-05 MED ORDER — TIMOLOL HEMIHYDRATE 0.5 % OP SOLN: 1.0000 [drp] | Freq: Every day | OPHTHALMIC | Status: DC

## 2015-06-05 MED ORDER — MENTHOL 3 MG MT LOZG: 1.0000 | LOZENGE | OROMUCOSAL | Status: DC | PRN

## 2015-06-05 MED ORDER — LABETALOL HCL 5 MG/ML IV SOLN
INTRAVENOUS | Status: DC | PRN
  Administered 2015-06-05: 5 mg via INTRAVENOUS
  Administered 2015-06-05: 10 mg via INTRAVENOUS
  Administered 2015-06-05 (×3): 5 mg via INTRAVENOUS

## 2015-06-05 MED ORDER — IBUPROFEN 600 MG PO TABS
600.0000 mg | ORAL_TABLET | Freq: Four times a day (QID) | ORAL | Status: DC | PRN
Start: 2015-06-05 — End: 2015-06-07
  Administered 2015-06-06 – 2015-06-07 (×5): 600 mg via ORAL
  Filled 2015-06-05 (×5): qty 1

## 2015-06-05 MED ORDER — ONDANSETRON HCL 4 MG/2ML IJ SOLN: 4.0000 mg | Freq: Four times a day (QID) | INTRAMUSCULAR | Status: DC | PRN

## 2015-06-05 MED ORDER — NALOXONE HCL 0.4 MG/ML IJ SOLN: 0.4000 mg | INTRAMUSCULAR | Status: DC | PRN

## 2015-06-05 MED ORDER — ROCURONIUM BROMIDE 100 MG/10ML IV SOLN
INTRAVENOUS | Status: DC | PRN
  Administered 2015-06-05 (×2): 10 mg via INTRAVENOUS
  Administered 2015-06-05: 50 mg via INTRAVENOUS

## 2015-06-05 MED ORDER — LABETALOL HCL 5 MG/ML IV SOLN
INTRAVENOUS | Status: AC
  Filled 2015-06-05: qty 4

## 2015-06-05 MED ORDER — ONDANSETRON HCL 4 MG/2ML IJ SOLN
INTRAMUSCULAR | Status: DC | PRN
  Administered 2015-06-05: 4 mg via INTRAVENOUS

## 2015-06-05 MED ORDER — DEXTROSE-NACL 5-0.45 % IV SOLN
INTRAVENOUS | Status: DC
  Administered 2015-06-05 (×2): via INTRAVENOUS

## 2015-06-05 MED ORDER — OXYCODONE-ACETAMINOPHEN 5-325 MG PO TABS
1.0000 | ORAL_TABLET | ORAL | Status: DC | PRN
  Administered 2015-06-06 – 2015-06-07 (×4): 1 via ORAL
  Filled 2015-06-05 (×4): qty 1

## 2015-06-05 MED ORDER — ONDANSETRON HCL 4 MG/2ML IJ SOLN
INTRAMUSCULAR | Status: AC
  Filled 2015-06-05: qty 2

## 2015-06-05 MED ORDER — FENTANYL CITRATE (PF) 100 MCG/2ML IJ SOLN
25.0000 ug | INTRAMUSCULAR | Status: DC | PRN
  Administered 2015-06-05: 50 ug via INTRAVENOUS

## 2015-06-05 MED ORDER — NEOSTIGMINE METHYLSULFATE 10 MG/10ML IV SOLN
INTRAVENOUS | Status: DC | PRN
  Administered 2015-06-05: 3 mg via INTRAVENOUS

## 2015-06-05 MED ORDER — GLYCOPYRROLATE 0.2 MG/ML IJ SOLN
INTRAMUSCULAR | Status: DC | PRN
  Administered 2015-06-05: 0.4 mg via INTRAVENOUS

## 2015-06-05 MED ORDER — HYDROMORPHONE 1 MG/ML IV SOLN
INTRAVENOUS | Status: DC
  Administered 2015-06-05: 12:00:00 via INTRAVENOUS
  Administered 2015-06-05: 2.8 mg via INTRAVENOUS
  Administered 2015-06-06: 0.4 mg via INTRAVENOUS
  Filled 2015-06-05: qty 25

## 2015-06-05 MED ORDER — BUPIVACAINE-EPINEPHRINE (PF) 0.25% -1:200000 IJ SOLN
INTRAMUSCULAR | Status: AC
  Filled 2015-06-05: qty 30

## 2015-06-05 MED ORDER — ESTRADIOL 0.1 MG/GM VA CREA
TOPICAL_CREAM | VAGINAL | Status: AC
Start: 2015-06-05 — End: 2015-06-05
  Filled 2015-06-05: qty 42.5

## 2015-06-05 MED ORDER — LACTATED RINGERS IR SOLN
Status: DC | PRN
  Administered 2015-06-05: 3000 mL

## 2015-06-05 MED ORDER — DIPHENHYDRAMINE HCL 12.5 MG/5ML PO ELIX: 12.5000 mg | ORAL_SOLUTION | Freq: Four times a day (QID) | ORAL | Status: DC | PRN

## 2015-06-05 MED ORDER — PROMETHAZINE HCL 25 MG/ML IJ SOLN
6.2500 mg | INTRAMUSCULAR | Status: DC | PRN
  Administered 2015-06-05: 6.25 mg via INTRAVENOUS

## 2015-06-05 MED ORDER — DEXAMETHASONE SODIUM PHOSPHATE 4 MG/ML IJ SOLN
INTRAMUSCULAR | Status: AC
  Filled 2015-06-05: qty 1

## 2015-06-05 MED ORDER — BUPIVACAINE HCL (PF) 0.25 % IJ SOLN
INTRAMUSCULAR | Status: DC | PRN
  Administered 2015-06-05: 6 mL

## 2015-06-05 MED ORDER — BUPIVACAINE HCL (PF) 0.25 % IJ SOLN
INTRAMUSCULAR | Status: AC
  Filled 2015-06-05: qty 30

## 2015-06-05 MED ORDER — LACTATED RINGERS IV SOLN
INTRAVENOUS | Status: DC
  Administered 2015-06-05 (×2): via INTRAVENOUS

## 2015-06-05 MED ORDER — FENTANYL CITRATE (PF) 100 MCG/2ML IJ SOLN
INTRAMUSCULAR | Status: DC | PRN
  Administered 2015-06-05 (×2): 50 ug via INTRAVENOUS
  Administered 2015-06-05: 100 ug via INTRAVENOUS
  Administered 2015-06-05: 50 ug via INTRAVENOUS
  Administered 2015-06-05: 100 ug via INTRAVENOUS

## 2015-06-05 MED ORDER — MIDAZOLAM HCL 2 MG/2ML IJ SOLN
INTRAMUSCULAR | Status: AC
  Filled 2015-06-05: qty 4

## 2015-06-05 MED ORDER — ESTRADIOL 0.1 MG/GM VA CREA
TOPICAL_CREAM | VAGINAL | Status: DC | PRN
  Administered 2015-06-05: 1 via VAGINAL

## 2015-06-05 MED ORDER — TIMOLOL MALEATE 0.5 % OP SOLN
1.0000 [drp] | Freq: Every day | OPHTHALMIC | Status: DC
  Administered 2015-06-05 – 2015-06-06 (×2): 1 [drp] via OPHTHALMIC
  Filled 2015-06-05: qty 5

## 2015-06-05 SURGICAL SUPPLY — 56 items
BLADE SURG 15 STRL LF C SS BP (BLADE) ×2 IMPLANT
BLADE SURG 15 STRL SS (BLADE) ×2
CABLE HIGH FREQUENCY MONO STRZ (ELECTRODE) ×4 IMPLANT
CATH ROBINSON RED A/P 16FR (CATHETERS) ×4 IMPLANT
CLOSURE WOUND 1/4 X3 (GAUZE/BANDAGES/DRESSINGS)
CLOTH BEACON ORANGE TIMEOUT ST (SAFETY) ×4 IMPLANT
CONT PATH 16OZ SNAP LID 3702 (MISCELLANEOUS) ×4 IMPLANT
COVER BACK TABLE 60X90IN (DRAPES) ×4 IMPLANT
DECANTER SPIKE VIAL GLASS SM (MISCELLANEOUS) ×12 IMPLANT
DEVICE CAPIO SLIM SINGLE (INSTRUMENTS) ×4 IMPLANT
DRSG COVADERM PLUS 2X2 (GAUZE/BANDAGES/DRESSINGS) ×8 IMPLANT
DRSG OPSITE POSTOP 3X4 (GAUZE/BANDAGES/DRESSINGS) ×4 IMPLANT
DURAPREP 26ML APPLICATOR (WOUND CARE) ×4 IMPLANT
ELECT LIGASURE LONG (ELECTRODE) ×4 IMPLANT
ELECT REM PT RETURN 9FT ADLT (ELECTROSURGICAL) ×4
ELECTRODE REM PT RTRN 9FT ADLT (ELECTROSURGICAL) ×2 IMPLANT
FORCEPS CUTTING 45CM 5MM (CUTTING FORCEPS) ×4 IMPLANT
GLOVE BIO SURGEON STRL SZ8 (GLOVE) ×4 IMPLANT
GLOVE BIOGEL PI IND STRL 6.5 (GLOVE) ×2 IMPLANT
GLOVE BIOGEL PI IND STRL 7.0 (GLOVE) ×4 IMPLANT
GLOVE BIOGEL PI INDICATOR 6.5 (GLOVE) ×2
GLOVE BIOGEL PI INDICATOR 7.0 (GLOVE) ×4
GLOVE SURG ORTHO 8.0 STRL STRW (GLOVE) ×12 IMPLANT
GOWN STRL REUS W/TWL LRG LVL3 (GOWN DISPOSABLE) ×16 IMPLANT
LEGGING LITHOTOMY PAIR STRL (DRAPES) ×4 IMPLANT
LIQUID BAND (GAUZE/BANDAGES/DRESSINGS) ×4 IMPLANT
NEEDLE HYPO 22GX1.5 SAFETY (NEEDLE) IMPLANT
NEEDLE INSUFFLATION 120MM (ENDOMECHANICALS) ×4 IMPLANT
NEEDLE MAYO 6 CRC TAPER PT (NEEDLE) ×4 IMPLANT
NEEDLE SPNL 22GX3.5 QUINCKE BK (NEEDLE) IMPLANT
NS IRRIG 1000ML POUR BTL (IV SOLUTION) ×4 IMPLANT
PACK LAVH (CUSTOM PROCEDURE TRAY) ×4 IMPLANT
PACK ROBOTIC GOWN (GOWN DISPOSABLE) ×4 IMPLANT
PACK VAGINAL WOMENS (CUSTOM PROCEDURE TRAY) ×4 IMPLANT
PAD POSITIONING PINK XL (MISCELLANEOUS) ×4 IMPLANT
SET IRRIG TUBING LAPAROSCOPIC (IRRIGATION / IRRIGATOR) ×4 IMPLANT
SOLUTION ELECTROLUBE (MISCELLANEOUS) ×4 IMPLANT
SPONGE LAP 18X18 X RAY DECT (DISPOSABLE) ×4 IMPLANT
STRIP CLOSURE SKIN 1/4X3 (GAUZE/BANDAGES/DRESSINGS) IMPLANT
SUT CAPIO ETHIBPND (SUTURE) ×4 IMPLANT
SUT MNCRL 0 MO-4 VIOLET 18 CR (SUTURE) ×6 IMPLANT
SUT MNCRL 0 VIOLET 6X18 (SUTURE) ×2 IMPLANT
SUT MNCRL AB 0 CT1 27 (SUTURE) IMPLANT
SUT MON AB 2-0 CT1 27 (SUTURE) ×16 IMPLANT
SUT MON AB 2-0 CT1 36 (SUTURE) ×16 IMPLANT
SUT MONOCRYL 0 6X18 (SUTURE) ×2
SUT MONOCRYL 0 MO 4 18  CR/8 (SUTURE) ×6
SUT PDS AB 0 CT1 27 (SUTURE) IMPLANT
SUT VICRYL 0 UR6 27IN ABS (SUTURE) ×4 IMPLANT
SUT VICRYL RAPIDE 3 0 (SUTURE) ×4 IMPLANT
TOWEL OR 17X24 6PK STRL BLUE (TOWEL DISPOSABLE) ×8 IMPLANT
TRAY FOLEY CATH SILVER 14FR (SET/KITS/TRAYS/PACK) ×4 IMPLANT
TROCAR OPTI TIP 5M 100M (ENDOMECHANICALS) ×4 IMPLANT
TROCAR XCEL DIL TIP R 11M (ENDOMECHANICALS) ×4 IMPLANT
WARMER LAPAROSCOPE (MISCELLANEOUS) ×4 IMPLANT
WATER STERILE IRR 1000ML POUR (IV SOLUTION) ×4 IMPLANT

## 2015-06-05 NOTE — Anesthesia Preprocedure Evaluation (Addendum)
Anesthesia Evaluation  Patient identified by MRN, date of birth, ID band Patient awake    Reviewed: Allergy & Precautions, NPO status , Patient's Chart, lab work & pertinent test results  Airway Mallampati: II  TM Distance: >3 FB Neck ROM: Full    Dental  (+) Teeth Intact, Dental Advisory Given   Pulmonary neg pulmonary ROS,    Pulmonary exam normal breath sounds clear to auscultation       Cardiovascular Exercise Tolerance: Good (-) hypertension(-) angina(-) Past MI negative cardio ROS Normal cardiovascular exam Rhythm:Regular Rate:Normal     Neuro/Psych negative neurological ROS  negative psych ROS   GI/Hepatic negative GI ROS, Neg liver ROS,   Endo/Other  negative endocrine ROS  Renal/GU negative Renal ROS  Female GU complaint     Musculoskeletal negative musculoskeletal ROS (+)   Abdominal   Peds  Hematology negative hematology ROS (+)   Anesthesia Other Findings Day of surgery medications reviewed with the patient.  Reproductive/Obstetrics negative OB ROS                            Anesthesia Physical Anesthesia Plan  ASA: II  Anesthesia Plan: General   Post-op Pain Management:    Induction: Intravenous  Airway Management Planned: Oral ETT  Additional Equipment:   Intra-op Plan:   Post-operative Plan: Extubation in OR  Informed Consent: I have reviewed the patients History and Physical, chart, labs and discussed the procedure including the risks, benefits and alternatives for the proposed anesthesia with the patient or authorized representative who has indicated his/her understanding and acceptance.   Dental advisory given  Plan Discussed with: CRNA  Anesthesia Plan Comments: (Risks/benefits of general anesthesia discussed with patient including risk of damage to teeth, lips, gum, and tongue, nausea/vomiting, allergic reactions to medications, and the possibility of  heart attack, stroke and death.  All patient questions answered.  Patient wishes to proceed.)       Anesthesia Quick Evaluation

## 2015-06-05 NOTE — Transfer of Care (Signed)
Immediate Anesthesia Transfer of Care Note  Patient: Morgan Huff  Procedure(s) Performed: Procedure(s): LAPAROSCOPIC ASSISTED VAGINAL HYSTERECTOMY WITH BILATERAL SALPINGO OOPHORECTOMY (Bilateral) ANTERIOR AND POSTERIOR REPAIR WITH SACROSPINOUS LIGAMENT SUSPENSION (N/A)  Patient Location: PACU  Anesthesia Type:General  Level of Consciousness: awake, alert  and oriented  Airway & Oxygen Therapy: Patient Spontanous Breathing and Patient connected to nasal cannula oxygen  Post-op Assessment: Report given to RN and Post -op Vital signs reviewed and stable  Post vital signs: Reviewed and stable  Last Vitals:  Filed Vitals:   06/05/15 0605  BP: 142/94  Pulse: 88  Temp: 36.9 C  Resp: 20    Complications: No apparent anesthesia complications

## 2015-06-05 NOTE — Anesthesia Postprocedure Evaluation (Signed)
  Anesthesia Post-op Note  Patient: Morgan Huff  Procedure(s) Performed: Procedure(s) (LRB): LAPAROSCOPIC ASSISTED VAGINAL HYSTERECTOMY WITH BILATERAL SALPINGO OOPHORECTOMY (Bilateral) ANTERIOR AND POSTERIOR REPAIR WITH SACROSPINOUS LIGAMENT SUSPENSION (N/A)  Patient Location: PACU  Anesthesia Type: General  Level of Consciousness: awake and alert   Airway and Oxygen Therapy: Patient Spontanous Breathing  Post-op Pain: mild  Post-op Assessment: Post-op Vital signs reviewed, Patient's Cardiovascular Status Stable, Respiratory Function Stable, Patent Airway and No signs of Nausea or vomiting  Last Vitals:  Filed Vitals:   06/05/15 1124  BP:   Pulse:   Temp: 36.6 C  Resp:     Post-op Vital Signs: stable   Complications: No apparent anesthesia complications

## 2015-06-05 NOTE — Addendum Note (Signed)
Addendum  created 06/05/15 1644 by Jonna Munro, CRNA   Modules edited: Notes Section   Notes Section:  File: [id number]

## 2015-06-05 NOTE — Anesthesia Procedure Notes (Signed)
Procedure Name: Intubation Date/Time: 06/05/2015 7:38 AM Performed by: Jonna Munro Pre-anesthesia Checklist: Patient identified, Emergency Drugs available, Suction available, Patient being monitored and Timeout performed Patient Re-evaluated:Patient Re-evaluated prior to inductionOxygen Delivery Method: Circle system utilized Preoxygenation: Pre-oxygenation with 100% oxygen Intubation Type: IV induction Ventilation: Mask ventilation without difficulty Laryngoscope Size: Mac and 3 Grade View: Grade I Tube type: Oral Tube size: 7.0 mm Number of attempts: 1 Airway Equipment and Method: Stylet Placement Confirmation: ETT inserted through vocal cords under direct vision,  positive ETCO2 and breath sounds checked- equal and bilateral Secured at: 22 (22 at lip) cm Tube secured with: Tape Dental Injury: Teeth and Oropharynx as per pre-operative assessment

## 2015-06-05 NOTE — Brief Op Note (Signed)
06/05/2015  9:52 AM  PATIENT:  Morgan Huff  69 y.o. female  PRE-OPERATIVE DIAGNOSIS:  UTERINE PROCENDENTIA, SYMPTOMATIC CYSTOCELE AND RECTOCELE  POST-OPERATIVE DIAGNOSIS:  uterine procendentia, symptomatic cystocele and rectocele, right ovarian cyst PROCEDURE:  Procedure(s): LAPAROSCOPIC ASSISTED VAGINAL HYSTERECTOMY WITH BILATERAL SALPINGO OOPHORECTOMY (Bilateral) ANTERIOR AND POSTERIOR REPAIR WITH SACROSPINOUS LIGAMENT SUSPENSION (N/A)  SURGEON:  Surgeon(s) and Role:    * Louretta Shorten, MD - Primary    * W Evette Cristal, MD - Assisting  PHYSICIAN ASSISTANT:   ASSISTANTS: neal   ANESTHESIA:   local and general  EBL:  Total I/O In: 1600 [I.V.:1600] Out: 675 [Urine:375; Blood:300]  BLOOD ADMINISTERED:none  DRAINS: Urinary Catheter (Foley)   LOCAL MEDICATIONS USED:  MARCAINE     SPECIMEN:  Source of Specimen:  uterus and both tubes and ovaries  DISPOSITION OF SPECIMEN:  PATHOLOGY  COUNTS:  YES  TOURNIQUET:  * No tourniquets in log *  DICTATION: .Other Dictation: Dictation Number 1  PLAN OF CARE: Admit to inpatient   PATIENT DISPOSITION:  PACU - hemodynamically stable.   Delay start of Pharmacological VTE agent (>24hrs) due to surgical blood loss or risk of bleeding: not applicable

## 2015-06-05 NOTE — Anesthesia Postprocedure Evaluation (Signed)
  Anesthesia Post-op Note  Patient: Morgan Huff  Procedure(s) Performed: Procedure(s): LAPAROSCOPIC ASSISTED VAGINAL HYSTERECTOMY WITH BILATERAL SALPINGO OOPHORECTOMY (Bilateral) ANTERIOR AND POSTERIOR REPAIR WITH SACROSPINOUS LIGAMENT SUSPENSION (N/A)  Patient Location: Mother/Baby  Anesthesia Type:General  Level of Consciousness: awake, alert  and oriented  Airway and Oxygen Therapy: Patient Spontanous Breathing and Patient connected to nasal cannula oxygen  Post-op Pain: none  Post-op Assessment: Post-op Vital signs reviewed, Patient's Cardiovascular Status Stable, Respiratory Function Stable, Patent Airway, Adequate PO intake and Pain level controlled              Post-op Vital Signs: Reviewed and stable  Last Vitals:  Filed Vitals:   06/05/15 1442  BP: 141/82  Pulse: 98  Temp: 36.8 C  Resp: 20    Complications: No apparent anesthesia complications

## 2015-06-06 ENCOUNTER — Encounter (HOSPITAL_COMMUNITY): Payer: Self-pay | Admitting: Obstetrics and Gynecology

## 2015-06-06 LAB — CBC
HEMATOCRIT: 38.8 % (ref 36.0–46.0)
HEMOGLOBIN: 12.6 g/dL (ref 12.0–15.0)
MCH: 29.6 pg (ref 26.0–34.0)
MCHC: 32.5 g/dL (ref 30.0–36.0)
MCV: 91.3 fL (ref 78.0–100.0)
Platelets: 244 10*3/uL (ref 150–400)
RBC: 4.25 MIL/uL (ref 3.87–5.11)
RDW: 13.9 % (ref 11.5–15.5)
WBC: 11 10*3/uL — AB (ref 4.0–10.5)

## 2015-06-06 NOTE — Progress Notes (Signed)
Vaginal packing removed as ordered. Minimal drainage noted. Patient tolerated well.

## 2015-06-06 NOTE — Op Note (Signed)
NAMEMarland Kitchen  Morgan Huff, Morgan Huff NO.:  [id number]  MEDICAL RECORD NO.:  [id number]  LOCATION:  8828                          FACILITY:  [facility]  PHYSICIAN:  Monia Sabal. Corinna Capra, M.D.    DATE OF BIRTH:  1945/11/20  DATE OF PROCEDURE:  06/05/2015 DATE OF DISCHARGE:                              OPERATIVE REPORT   PREOPERATIVE DIAGNOSES:  Symptomatic uterine prolapse with procidentia, cystocele, and rectocele.  POSTOPERATIVE DIAGNOSES:  Symptomatic uterine prolapse with procidentia, cystocele, and rectocele plus right ovarian cyst.  PROCEDURE:  Laparoscopic-assisted vaginal hysterectomy, lysis of adhesions, bilateral salpingo-oophorectomy, and anterior and posterior colporrhaphy with sacral colpopexy.  SURGEON:  Monia Sabal. Corinna Capra, M.D.  ASSISTANTMaisie Fus, M.D.  ANESTHESIA:  General endotracheal.  INDICATIONS:  Ms. Sweaney is having symptomatic pelvic relaxation with the prolapse.  She has had complete uterine prolapse problem when she stands or sneeze, whole uterus does come out.  She has to put it back in, in order to void.  Does follow on a regular basis.  She denies any leaking of urine, it has become very troubling and she desires definitive surgical intervention since she has tried pessary without success.  Risks and benefits of procedure were discussed at length, which include, but not limited to, risk of infection, bleeding, damage to the bowel, bladder, ureter, risk associated with incontinence, prolonged catheter, where blood transfusion and urinary retention were discussed.  All of her questions were answered.  She is given written and informed consent.  FINDINGS:  At the time of surgery, she had a 6-7 cm right ovarian cyst in the cul-de-sac.  No evidence of ascites or omental caking.  Normal- appearing liver, uterus, left ovary, and fallopian tube.  DESCRIPTION OF PROCEDURE:  After adequate analgesia, the patient was placed in the dorsal lithotomy position.   She is sterilely prepped and draped.  Bladder is sterilely drained.  A Cohen tenaculum was placed on the cervix.  A 1-cm infraumbilical skin incision was made.  A Veress needle was inserted.  The abdomen was insufflated with dullness to percussion.  An 11-mm trocar was inserted.  The laparoscope was inserted.  The above findings were noted.  A 5-mm trocar was inserted left of the midline, 2 fingerbreadths above the pubic symphysis under direct visualization.  After careful systematic evaluation of the abdomen and pelvis, a Gyrus cutting forceps were used to dissect and ligate across the left infundibulopelvic ligament across the round ligament, down to the inferior portion of the broad ligament with the left fallopian tube and ovary falling towards the uterus.  The right ovarian cyst was underneath the uterus and adhered to some epiploic and pelvic sidewall adhesions.  These were gently dissected using Gyrus cutting forceps.  Care taken to avoid the underlying bowel and underlying ureter.  I was able to identify the infundibulopelvic ligament, ligated and dissected across the IFP, down across the round ligament to the inferior portion of the broad ligament.  The abdomen was then deflated.  Legs were repositioned.  The cervix was then circumscribed with Bovie cautery.  The posterior peritoneum was entered sharply.  LigaSure was used to ligate across the uterosacral ligaments. Bladder pillars and cardinal  ligaments bilaterally.  The anterior vaginal mucosa was dissected off the anterior cervix and the anterior peritoneum was entered sharply.  Deaver retractor placed underneath the bladder, was able to remove the uterus with the left tube and ovary. The right ovary was then grasped and due to its size, we were having difficulty pulling it through the cuff.  A small window was made within the cuff and clear fluid was extracted.  The remaining cyst was pulled through the cuff.  It did appear  to have clear serous fluid and there was no evidence of any type of solid tumor nor septations noted within the cyst.  It was sent for frozen pathology, which returned a benign serous cyst.  The uterosacral ligaments were identified, suture ligated with figure-of-eight with 0 Monocryl suture.  The posterior peritoneum was then closed in a pursestring fashion.  Posterior vaginal mucosa was closed in a vertical fashion, plicating the uterosacral ligaments in the midline using figure-of-eights of 0 Monocryl suture, closing the posterior vaginal cuff.  The anterior vaginal mucosa was undermined and dissected in a midline fashion, reflecting the vaginal mucosa laterally until approximately 2 cm from the urethral meatus.  The pubovesicocervical fascia was then plicated in the midline using figure- of-eights of 0 Monocryl suture.  The excess vaginal mucosa was excised and the anterior vaginal mucosa was closed with 2-0 Monocryl suture in a running, locking fashion including closing the remainder of the cuff.  A triangular flap of skin was dissected off the perineal body and the posterior vaginal mucosa was then undermined in the midline and dissected, reflecting the posterior vaginal mucosa laterally, until we reached the apex of the cuff of the vagina.  Bluntly dissected along the right sacrospinous ligament, approximately 2 fingerbreadths from the sacrospinous process, a Capio device was used to place a suture through the sacrospinous ligament with a 0 Ethibond suture.  The suture was then ligated to the right apex of the cuff in a pulley suture fashion and the suture was placed aside.  Posterior repair was then carried out, plicating the perirectal fascia in the midline using figure-of-eight of 0 Monocryl suture.  We began closing the apex of the cuff with 2-0 Monocryl.  The supraspinous suture was then tied and were able to pull the apex of the cuff to the sacrospinous process.  It was then  tied and cut with good support of the apex and vagina noted.  The remaining portion of posterior vaginal mucosa was then closed with the Monocryl suture with 2-0 Monocryl suture in a running, locking fashion to the perineal body, which was closed in a subcuticular fashion.  Good support of the bladder and the rectum were noted in the apex.  Foley catheter was placed for return of clear yellow urine.  The vagina was then packed with 2-inch gauze coated with estrogen.  Legs were repositioned.  The abdomen was reinsufflated.  Examination of the cul-de-sac revealed good hemostasis of the pedicles because ureters were visualized away from the dissection field with peristalsis noted bilaterally.  No obvious bleeding or complications noted.  A Nezhat suction irrigation was used and after copious amount of irrigation, adequate hemostasis was assured. The trocars removed.  The infraumbilical skin incision was closed with 0 Vicryl interrupted suture in the fascia, 3-0 Vicryl Rapide subcuticular suture.  The 5-mm site was closed with 3-0 Vicryl Rapide subcuticular suture.  The incision sites were then injected with 0.25% Marcaine, a total 10 mL used.  The patient was  then transferred to her room in a stable condition.  Sponges, needle, and instrument count was normal x3. Estimated blood loss was 300 mL.  The patient received 2 g of cefotetan preoperatively.     Monia Sabal Corinna Capra, M.D.     DCL/MEDQ  D:  06/05/2015  T:  06/06/2015  Job:  [id number]

## 2015-06-06 NOTE — Progress Notes (Signed)
1 Day Post-Op Procedure(s) (LRB): LAPAROSCOPIC ASSISTED VAGINAL HYSTERECTOMY WITH BILATERAL SALPINGO OOPHORECTOMY (Bilateral) ANTERIOR AND POSTERIOR REPAIR WITH SACROSPINOUS LIGAMENT SUSPENSION (N/A)  Subjective: Patient reports tolerating PO.  Pain well controlled with only 1 pain pill so far  Objective: I have reviewed patient's vital signs, intake and output, medications and labs.  General: alert, cooperative, appears stated age and no distress GI: soft, non-tender; bowel sounds normal; no masses,  no organomegaly and incision: clean, dry and intact  Assessment: s/p Procedure(s): LAPAROSCOPIC ASSISTED VAGINAL HYSTERECTOMY WITH BILATERAL SALPINGO OOPHORECTOMY (Bilateral) ANTERIOR AND POSTERIOR REPAIR WITH SACROSPINOUS LIGAMENT SUSPENSION (N/A): stable, progressing well and Bladder training today  Plan: Advance diet Encourage ambulation Advance to PO medication Discontinue IV fluids  LOS: 1 day    Sunny Aguon C 06/06/2015, 8:47 AM

## 2015-06-07 MED ORDER — IBUPROFEN 600 MG PO TABS: 600.0000 mg | ORAL_TABLET | Freq: Four times a day (QID) | ORAL | Status: DC | PRN

## 2015-06-07 MED ORDER — OXYCODONE-ACETAMINOPHEN 5-325 MG PO TABS: 1.0000 | ORAL_TABLET | ORAL | Status: DC | PRN

## 2015-06-07 NOTE — Discharge Summary (Signed)
  Admission Diagnosis: Total Procidentia  Discharge Diagnosis: Same  Hospital Course: 69 year old female with total procidentia.  She underwent LAVH, BSO and Sacrospinous ligament fixation and anterior and posterior repair. She did very well post op  She was ambulating, voiding and had good bowel and bladder function Her vital signs remained stable  BP 114/60 mmHg  Pulse 62  Temp(Src) 98 F (36.7 C) (Oral)  Resp 18  Ht 6\' 2"  (1.88 m)  Wt 172 lb (78.019 kg)  BMI 22.07 kg/m2  SpO2 96% No results found for this or any previous visit (from the past 24 hour(s)). Abdomen is soft and non tender  She was discharged home in good condition She was given rx for ibuprofen and percocet She will follow up with Dr. Corinna Capra in 1 week No driving for 1 week.

## 2015-10-06 DIAGNOSIS — H401331 Pigmentary glaucoma, bilateral, mild stage: Secondary | ICD-10-CM | POA: Diagnosis not present

## 2015-10-20 DIAGNOSIS — Z803 Family history of malignant neoplasm of breast: Secondary | ICD-10-CM | POA: Diagnosis not present

## 2015-10-20 DIAGNOSIS — Z1231 Encounter for screening mammogram for malignant neoplasm of breast: Secondary | ICD-10-CM | POA: Diagnosis not present

## 2015-10-20 LAB — HM MAMMOGRAPHY

## 2016-01-21 DIAGNOSIS — H401331 Pigmentary glaucoma, bilateral, mild stage: Secondary | ICD-10-CM | POA: Diagnosis not present

## 2016-02-17 ENCOUNTER — Other Ambulatory Visit: Payer: Self-pay | Admitting: Internal Medicine

## 2016-03-24 DIAGNOSIS — H401331 Pigmentary glaucoma, bilateral, mild stage: Secondary | ICD-10-CM | POA: Diagnosis not present

## 2016-04-12 ENCOUNTER — Encounter: Payer: Self-pay | Admitting: Internal Medicine

## 2016-04-12 ENCOUNTER — Ambulatory Visit (INDEPENDENT_AMBULATORY_CARE_PROVIDER_SITE_OTHER): Payer: Medicare Other | Admitting: Internal Medicine

## 2016-04-12 ENCOUNTER — Ambulatory Visit (INDEPENDENT_AMBULATORY_CARE_PROVIDER_SITE_OTHER)
Admission: RE | Admit: 2016-04-12 | Discharge: 2016-04-12 | Disposition: A | Discharge status: Home / Self Care | Payer: Medicare Other | Source: Ambulatory Visit | Attending: Internal Medicine | Admitting: Internal Medicine

## 2016-04-12 ENCOUNTER — Other Ambulatory Visit (INDEPENDENT_AMBULATORY_CARE_PROVIDER_SITE_OTHER): Payer: Medicare Other

## 2016-04-12 VITALS — BP 112/88 | HR 97 | Temp 98.7°F | Ht 74.0 in | Wt 262.0 lb

## 2016-04-12 DIAGNOSIS — M25551 Pain in right hip: Secondary | ICD-10-CM

## 2016-04-12 DIAGNOSIS — D751 Secondary polycythemia: Secondary | ICD-10-CM | POA: Diagnosis not present

## 2016-04-12 DIAGNOSIS — E785 Hyperlipidemia, unspecified: Secondary | ICD-10-CM

## 2016-04-12 DIAGNOSIS — G8929 Other chronic pain: Secondary | ICD-10-CM | POA: Diagnosis not present

## 2016-04-12 DIAGNOSIS — Z23 Encounter for immunization: Secondary | ICD-10-CM

## 2016-04-12 DIAGNOSIS — R739 Hyperglycemia, unspecified: Secondary | ICD-10-CM

## 2016-04-12 DIAGNOSIS — M169 Osteoarthritis of hip, unspecified: Secondary | ICD-10-CM | POA: Insufficient documentation

## 2016-04-12 DIAGNOSIS — Z0000 Encounter for general adult medical examination without abnormal findings: Secondary | ICD-10-CM | POA: Insufficient documentation

## 2016-04-12 DIAGNOSIS — Z86010 Personal history of colonic polyps: Secondary | ICD-10-CM

## 2016-04-12 DIAGNOSIS — M1611 Unilateral primary osteoarthritis, right hip: Secondary | ICD-10-CM | POA: Insufficient documentation

## 2016-04-12 DIAGNOSIS — N814 Uterovaginal prolapse, unspecified: Secondary | ICD-10-CM

## 2016-04-12 HISTORY — DX: Secondary polycythemia: D75.1

## 2016-04-12 LAB — URINALYSIS
BILIRUBIN URINE: NEGATIVE
HGB URINE DIPSTICK: NEGATIVE
KETONES UR: NEGATIVE
LEUKOCYTES UA: NEGATIVE
Nitrite: NEGATIVE
Specific Gravity, Urine: 1.02 (ref 1.000–1.030)
Total Protein, Urine: NEGATIVE
UROBILINOGEN UA: 0.2 (ref 0.0–1.0)
Urine Glucose: NEGATIVE
pH: 5.5 (ref 5.0–8.0)

## 2016-04-12 LAB — BASIC METABOLIC PANEL
BUN: 17 mg/dL (ref 6–23)
CHLORIDE: 106 meq/L (ref 96–112)
CO2: 29 mEq/L (ref 19–32)
Calcium: 9.6 mg/dL (ref 8.4–10.5)
Creatinine, Ser: 0.87 mg/dL (ref 0.40–1.20)
GFR: 68.32 mL/min (ref >60.00)
GLUCOSE: 117 mg/dL — AB (ref 70–99)
POTASSIUM: 4.7 meq/L (ref 3.5–5.1)
SODIUM: 141 meq/L (ref 135–145)

## 2016-04-12 LAB — HEPATIC FUNCTION PANEL
ALBUMIN: 4.2 g/dL (ref 3.5–5.2)
ALT: 11 U/L (ref 0–35)
AST: 16 U/L (ref 0–37)
Alkaline Phosphatase: 66 U/L (ref 39–117)
Bilirubin, Direct: 0.1 mg/dL (ref 0.0–0.3)
Total Bilirubin: 0.5 mg/dL (ref 0.2–1.2)
Total Protein: 7.4 g/dL (ref 6.0–8.3)

## 2016-04-12 LAB — SEDIMENTATION RATE: Sed Rate: 22 mm/hr (ref 0–30)

## 2016-04-12 LAB — LIPID PANEL
Cholesterol: 152 mg/dL (ref 0–200)
HDL: 41.7 mg/dL (ref >39.00)
LDL Cholesterol: 80 mg/dL (ref 0–99)
NONHDL: 109.8
Total CHOL/HDL Ratio: 4
Triglycerides: 150 mg/dL — ABNORMAL HIGH (ref 0.0–149.0)
VLDL: 30 mg/dL (ref 0.0–40.0)

## 2016-04-12 LAB — CBC WITH DIFFERENTIAL/PLATELET
BASOS PCT: 0.7 % (ref 0.0–3.0)
Basophils Absolute: 0.1 10*3/uL (ref 0.0–0.1)
EOS PCT: 2 % (ref 0.0–5.0)
Eosinophils Absolute: 0.2 10*3/uL (ref 0.0–0.7)
HCT: 45.1 % (ref 36.0–46.0)
HEMOGLOBIN: 15.4 g/dL — AB (ref 12.0–15.0)
LYMPHS ABS: 2.4 10*3/uL (ref 0.7–4.0)
Lymphocytes Relative: 27.3 % (ref 12.0–46.0)
MCHC: 34.2 g/dL (ref 30.0–36.0)
MCV: 87.5 fl (ref 78.0–100.0)
MONOS PCT: 9.6 % (ref 3.0–12.0)
Monocytes Absolute: 0.9 10*3/uL (ref 0.1–1.0)
NEUTROS PCT: 60.4 % (ref 43.0–77.0)
Neutro Abs: 5.4 10*3/uL (ref 1.4–7.7)
Platelets: 305 10*3/uL (ref 150.0–400.0)
RBC: 5.16 Mil/uL — AB (ref 3.87–5.11)
RDW: 14.1 % (ref 11.5–15.5)
WBC: 8.9 10*3/uL (ref 4.0–10.5)

## 2016-04-12 LAB — MICROALBUMIN / CREATININE URINE RATIO
CREATININE, U: 203 mg/dL
MICROALB UR: 0.7 mg/dL (ref 0.0–1.9)
Microalb Creat Ratio: 0.3 mg/g (ref 0.0–30.0)

## 2016-04-12 LAB — CK: Total CK: 55 U/L (ref 7–177)

## 2016-04-12 LAB — TSH: TSH: 2.52 u[IU]/mL (ref 0.35–4.50)

## 2016-04-12 LAB — HEMOGLOBIN A1C: Hgb A1c MFr Bld: 5.9 % (ref 4.6–6.5)

## 2016-04-12 LAB — HEPATITIS C ANTIBODY: HCV AB: NEGATIVE

## 2016-04-12 MED ORDER — SIMVASTATIN 10 MG PO TABS: 10.0000 mg | ORAL_TABLET | Freq: Every day | ORAL | 3 refills | Status: DC

## 2016-04-12 MED ORDER — IBUPROFEN 600 MG PO TABS: 600.0000 mg | ORAL_TABLET | Freq: Two times a day (BID) | ORAL | 1 refills | Status: DC | PRN

## 2016-04-12 MED ORDER — VITAMIN D 1000 UNITS PO TABS: 2000.0000 [IU] | ORAL_TABLET | Freq: Every day | ORAL | 3 refills | Status: DC

## 2016-04-12 NOTE — Assessment & Plan Note (Signed)
R - poss OA X ray -  Ibuprofen 600 mg bid prn

## 2016-04-12 NOTE — Progress Notes (Signed)
Pre visit review using our clinic review tool, if applicable. No additional management support is needed unless otherwise documented below in the visit note. 

## 2016-04-12 NOTE — Assessment & Plan Note (Signed)

## 2016-04-12 NOTE — Assessment & Plan Note (Signed)
Labs

## 2016-04-12 NOTE — Patient Instructions (Signed)
Start chair yoga

## 2016-04-12 NOTE — Assessment & Plan Note (Signed)
Mild 2016 CBC

## 2016-04-12 NOTE — Progress Notes (Signed)
Subjective:  Patient ID: Morgan Huff, female    DOB: May 07, 1946  Age: 70 y.o. MRN: [id number]  CC: No chief complaint on file.   HPI Morgan Huff presents for a well exam C/o R hip pain and stiffness in the hip - Ibuprofen helps  Outpatient Medications Prior to Visit  Medication Sig Dispense Refill  . Ascorbic Acid (VITAMIN C) 1000 MG tablet Take 1,000 mg by mouth daily.      Marland Kitchen aspirin 81 MG tablet Take 81 mg by mouth daily.    . Calcium Carbonate Antacid (TUMS ULTRA 1000 PO) Take 2 tablets by mouth daily.      . cholecalciferol (VITAMIN D) 1000 UNITS tablet Take 1,000 Units by mouth daily.      Marland Kitchen ibuprofen (ADVIL,MOTRIN) 200 MG tablet Take 400 mg by mouth daily.      . Multiple Vitamins-Minerals (CENTRUM SILVER PO) Take 1 tablet by mouth daily.      . Omega-3 Fatty Acids (FISH OIL) 1200 MG CAPS Take 1,200 mg by mouth daily.      . simvastatin (ZOCOR) 10 MG tablet Take 1 tablet by mouth  daily 90 tablet 0  . timolol (BETIMOL) 0.5 % ophthalmic solution Place 1 drop into the left eye daily.     Marland Kitchen ibuprofen (ADVIL,MOTRIN) 600 MG tablet Take 1 tablet (600 mg total) by mouth every 6 (six) hours as needed (mild pain). 30 tablet 0  . oxyCODONE-acetaminophen (PERCOCET/ROXICET) 5-325 MG tablet Take 1-2 tablets by mouth every 4 (four) hours as needed for severe pain (moderate to severe pain (when tolerating fluids)). (Patient not taking: Reported on 04/12/2016) 10 tablet 0   No facility-administered medications prior to visit.     ROS Review of Systems  Constitutional: Positive for unexpected weight change. Negative for activity change, appetite change, chills and fatigue.  HENT: Negative for congestion, mouth sores and sinus pressure.   Eyes: Negative for visual disturbance.  Respiratory: Negative for cough and chest tightness.   Gastrointestinal: Negative for abdominal pain and nausea.  Genitourinary: Negative for difficulty urinating, frequency and vaginal pain.    Musculoskeletal: Positive for back pain and gait problem.  Skin: Negative for pallor and rash.  Neurological: Negative for dizziness, tremors, weakness, numbness and headaches.  Psychiatric/Behavioral: Negative for confusion and sleep disturbance.    Objective:  BP 112/88   Pulse 97   Temp 98.7 F (37.1 C) (Oral)   Ht 6\' 2"  (1.88 m)   Wt 262 lb (118.8 kg)   SpO2 98%   BMI 33.64 kg/m   BP Readings from Last 3 Encounters:  04/12/16 112/88  06/07/15 114/60  05/28/15 (!) 137/99    Wt Readings from Last 3 Encounters:  04/12/16 262 lb (118.8 kg)  06/05/15 172 lb (78 kg)  05/28/15 172 lb (78 kg)    Physical Exam  Constitutional: She appears well-developed. No distress.  HENT:  Head: Normocephalic.  Right Ear: External ear normal.  Left Ear: External ear normal.  Nose: Nose normal.  Mouth/Throat: Oropharynx is clear and moist.  Eyes: Conjunctivae are normal. Pupils are equal, round, and reactive to light. Right eye exhibits no discharge. Left eye exhibits no discharge.  Neck: Normal range of motion. Neck supple. No JVD present. No tracheal deviation present. No thyromegaly present.  Cardiovascular: Normal rate, regular rhythm and normal heart sounds.   Pulmonary/Chest: No stridor. No respiratory distress. She has no wheezes.  Abdominal: Soft. Bowel sounds are normal. She exhibits no distension and no mass.  There is no tenderness. There is no rebound and no guarding.  Musculoskeletal: She exhibits no edema or tenderness.  Lymphadenopathy:    She has no cervical adenopathy.  Neurological: She displays normal reflexes. No cranial nerve deficit. She exhibits normal muscle tone. Coordination normal.  Skin: No rash noted. No erythema.  Psychiatric: She has a normal mood and affect. Her behavior is normal. Judgment and thought content normal.  Obese R hip is tender in the groin  Lab Results  Component Value Date   WBC 11.0 (H) 06/06/2015   HGB 12.6 06/06/2015   HCT 38.8  06/06/2015   PLT 244 06/06/2015   GLUCOSE 103 (H) 04/11/2015   CHOL 165 04/11/2015   TRIG 125.0 04/11/2015   HDL 47.80 04/11/2015   LDLDIRECT 85.6 02/20/2008   LDLCALC 92 04/11/2015   ALT 13 04/11/2015   AST 16 04/11/2015   NA 142 04/11/2015   K 4.4 04/11/2015   CL 107 04/11/2015   CREATININE 0.90 04/11/2015   BUN 22 04/11/2015   CO2 29 04/11/2015   TSH 2.39 04/11/2015   HGBA1C 5.9 04/11/2015    No results found.  Assessment & Plan:   There are no diagnoses linked to this encounter. I am having Ms. Cimino maintain her Fish Oil, cholecalciferol, Multiple Vitamins-Minerals (CENTRUM SILVER PO), Calcium Carbonate Antacid (TUMS ULTRA 1000 PO), ibuprofen, vitamin C, timolol, aspirin, oxyCODONE-acetaminophen, simvastatin, and Polyethyl Glycol-Propyl Glycol (SYSTANE OP).  Meds ordered this encounter  Medications  . Polyethyl Glycol-Propyl Glycol (SYSTANE OP)    Sig: Apply to eye daily as needed.     Follow-up: No Follow-up on file.  Walker Kehr, MD

## 2016-04-12 NOTE — Assessment & Plan Note (Signed)
F/u w/Dr Magod 

## 2016-04-12 NOTE — Assessment & Plan Note (Signed)
On Simvastatin 

## 2016-04-12 NOTE — Assessment & Plan Note (Signed)
Dr Corinna Capra

## 2016-04-13 LAB — RHEUMATOID FACTOR

## 2016-04-15 DIAGNOSIS — Z01419 Encounter for gynecological examination (general) (routine) without abnormal findings: Secondary | ICD-10-CM | POA: Diagnosis not present

## 2016-04-15 DIAGNOSIS — Z6833 Body mass index (BMI) 33.0-33.9, adult: Secondary | ICD-10-CM | POA: Diagnosis not present

## 2016-04-26 ENCOUNTER — Encounter: Payer: Self-pay | Admitting: Internal Medicine

## 2016-05-17 ENCOUNTER — Encounter: Payer: Self-pay | Admitting: Internal Medicine

## 2016-06-02 DIAGNOSIS — D1801 Hemangioma of skin and subcutaneous tissue: Secondary | ICD-10-CM | POA: Diagnosis not present

## 2016-06-02 DIAGNOSIS — L821 Other seborrheic keratosis: Secondary | ICD-10-CM | POA: Diagnosis not present

## 2016-06-02 DIAGNOSIS — L814 Other melanin hyperpigmentation: Secondary | ICD-10-CM | POA: Diagnosis not present

## 2016-06-02 DIAGNOSIS — L720 Epidermal cyst: Secondary | ICD-10-CM | POA: Diagnosis not present

## 2016-06-02 DIAGNOSIS — B078 Other viral warts: Secondary | ICD-10-CM | POA: Diagnosis not present

## 2016-06-11 ENCOUNTER — Encounter: Payer: Self-pay | Admitting: Internal Medicine

## 2016-06-11 ENCOUNTER — Ambulatory Visit (INDEPENDENT_AMBULATORY_CARE_PROVIDER_SITE_OTHER): Payer: Medicare Other | Admitting: Internal Medicine

## 2016-06-11 DIAGNOSIS — M25551 Pain in right hip: Secondary | ICD-10-CM | POA: Diagnosis not present

## 2016-06-11 DIAGNOSIS — R739 Hyperglycemia, unspecified: Secondary | ICD-10-CM

## 2016-06-11 DIAGNOSIS — R05 Cough: Secondary | ICD-10-CM | POA: Diagnosis not present

## 2016-06-11 DIAGNOSIS — G8929 Other chronic pain: Secondary | ICD-10-CM

## 2016-06-11 DIAGNOSIS — E782 Mixed hyperlipidemia: Secondary | ICD-10-CM

## 2016-06-11 DIAGNOSIS — R059 Cough, unspecified: Secondary | ICD-10-CM | POA: Insufficient documentation

## 2016-06-11 NOTE — Progress Notes (Signed)
Pre visit review using our clinic review tool, if applicable. No additional management support is needed unless otherwise documented below in the visit note. 

## 2016-06-11 NOTE — Assessment & Plan Note (Signed)
Monitor labs 

## 2016-06-11 NOTE — Assessment & Plan Note (Signed)
Chronic - moderate OA on X ray Ibuprofen X ray prn Pt declined Tramadol Cont w/chair yoga Dr Oneida Alar if needed

## 2016-06-11 NOTE — Assessment & Plan Note (Signed)
On Simvastatin 

## 2016-06-11 NOTE — Progress Notes (Signed)
Subjective:  Patient ID: Morgan Huff, female    DOB: Feb 13, 1946  Age: 70 y.o. MRN: [id number]  CC: No chief complaint on file.   HPI RAHINI STIFTER presents for a URI sx's x 5 weeks - resolving.  F/u R hip pain - not much better. F/u dyslipidemia - re-started Simvastatin  Outpatient Medications Prior to Visit  Medication Sig Dispense Refill  . Ascorbic Acid (VITAMIN C) 1000 MG tablet Take 1,000 mg by mouth daily.      Marland Kitchen aspirin 81 MG tablet Take 81 mg by mouth daily.    . Calcium Carbonate Antacid (TUMS ULTRA 1000 PO) Take 2 tablets by mouth daily.      . cholecalciferol (VITAMIN D) 1000 units tablet Take 2 tablets (2,000 Units total) by mouth daily. 100 tablet 3  . ibuprofen (ADVIL,MOTRIN) 600 MG tablet Take 1 tablet (600 mg total) by mouth 2 (two) times daily as needed for moderate pain. 180 tablet 1  . Multiple Vitamins-Minerals (CENTRUM SILVER PO) Take 1 tablet by mouth daily.      . Omega-3 Fatty Acids (FISH OIL) 1200 MG CAPS Take 1,200 mg by mouth daily.      Vladimir Faster Glycol-Propyl Glycol (SYSTANE OP) Apply to eye daily as needed.    . simvastatin (ZOCOR) 10 MG tablet Take 1 tablet (10 mg total) by mouth daily. 90 tablet 3  . timolol (BETIMOL) 0.5 % ophthalmic solution Place 1 drop into the left eye daily.      No facility-administered medications prior to visit.     ROS Review of Systems  Constitutional: Negative for activity change, appetite change, chills, fatigue and unexpected weight change.  HENT: Negative for congestion, mouth sores and sinus pressure.   Eyes: Negative for visual disturbance.  Respiratory: Negative for cough and chest tightness.   Gastrointestinal: Negative for abdominal pain and nausea.  Genitourinary: Negative for difficulty urinating, frequency and vaginal pain.  Musculoskeletal: Negative for back pain and gait problem.  Skin: Negative for pallor and rash.  Neurological: Negative for dizziness, tremors, weakness, numbness and  headaches.  Psychiatric/Behavioral: Negative for confusion and sleep disturbance.    Objective:  BP 140/90   Pulse 60   Wt 260 lb (117.9 kg)   SpO2 95%   BMI 33.38 kg/m   BP Readings from Last 3 Encounters:  06/11/16 140/90  04/12/16 112/88  06/07/15 114/60    Wt Readings from Last 3 Encounters:  06/11/16 260 lb (117.9 kg)  04/12/16 262 lb (118.8 kg)  06/05/15 172 lb (78 kg)    Physical Exam  Constitutional: She appears well-developed. No distress.  HENT:  Head: Normocephalic.  Right Ear: External ear normal.  Left Ear: External ear normal.  Nose: Nose normal.  Mouth/Throat: Oropharynx is clear and moist.  Eyes: Conjunctivae are normal. Pupils are equal, round, and reactive to light. Right eye exhibits no discharge. Left eye exhibits no discharge.  Neck: Normal range of motion. Neck supple. No JVD present. No tracheal deviation present. No thyromegaly present.  Cardiovascular: Normal rate, regular rhythm and normal heart sounds.   Pulmonary/Chest: No stridor. No respiratory distress. She has no wheezes.  Abdominal: Soft. Bowel sounds are normal. She exhibits no distension and no mass. There is no tenderness. There is no rebound and no guarding.  Musculoskeletal: She exhibits no edema or tenderness.  Lymphadenopathy:    She has no cervical adenopathy.  Neurological: She displays normal reflexes. No cranial nerve deficit. She exhibits normal muscle tone. Coordination normal.  Skin: No rash noted. No erythema.  Psychiatric: She has a normal mood and affect. Her behavior is normal. Judgment and thought content normal.    Lab Results  Component Value Date   WBC 8.9 04/12/2016   HGB 15.4 (H) 04/12/2016   HCT 45.1 04/12/2016   PLT 305.0 04/12/2016   GLUCOSE 117 (H) 04/12/2016   CHOL 152 04/12/2016   TRIG 150.0 (H) 04/12/2016   HDL 41.70 04/12/2016   LDLDIRECT 85.6 02/20/2008   LDLCALC 80 04/12/2016   ALT 11 04/12/2016   AST 16 04/12/2016   NA 141 04/12/2016   K  4.7 04/12/2016   CL 106 04/12/2016   CREATININE 0.87 04/12/2016   BUN 17 04/12/2016   CO2 29 04/12/2016   TSH 2.52 04/12/2016   HGBA1C 5.9 04/12/2016   MICROALBUR 0.7 04/12/2016    Dg Hip Unilat With Pelvis 2-3 Views Right  Result Date: 04/12/2016 CLINICAL DATA:  70 year old female with right hip and groin pain and stiffness for 3 months with no known injury. Severe right hip pain. Initial encounter. EXAM: DG HIP (WITH OR WITHOUT PELVIS) 2-3V RIGHT COMPARISON:  None. FINDINGS: Moderate medial/axial hip joint space loss on the right with moderate to severe osteophytosis, and asymmetric increased subchondral sclerosis. Left hip joint space better preserved. Proximal left femur appears normal for age. Pelvis appears intact. Sacral ala and SI joints appear normal. Lower lumbar spine degeneration partially visible. Proximal right femur is intact. IMPRESSION: 1. Moderate right hip osteoarthritis. 2.  No acute osseous abnormality identified. Electronically Signed   By: Genevie Ann M.D.   On: 04/12/2016 16:07    Assessment & Plan:   There are no diagnoses linked to this encounter. I am having Ms. Forner maintain her Fish Oil, Multiple Vitamins-Minerals (CENTRUM SILVER PO), Calcium Carbonate Antacid (TUMS ULTRA 1000 PO), vitamin C, timolol, aspirin, Polyethyl Glycol-Propyl Glycol (SYSTANE OP), simvastatin, cholecalciferol, and ibuprofen.  No orders of the defined types were placed in this encounter.    Follow-up: No Follow-up on file.  Walker Kehr, MD

## 2016-06-11 NOTE — Assessment & Plan Note (Addendum)
OTC meds D/c fish oil

## 2016-08-06 ENCOUNTER — Encounter: Payer: Self-pay | Admitting: Internal Medicine

## 2016-08-06 MED ORDER — IBUPROFEN 600 MG PO TABS: 600.0000 mg | ORAL_TABLET | Freq: Two times a day (BID) | ORAL | 0 refills | Status: DC | PRN

## 2016-08-06 MED ORDER — SIMVASTATIN 10 MG PO TABS: 10.0000 mg | ORAL_TABLET | Freq: Every day | ORAL | 3 refills | Status: DC

## 2016-08-13 ENCOUNTER — Other Ambulatory Visit: Payer: Self-pay | Admitting: Internal Medicine

## 2016-09-01 ENCOUNTER — Encounter: Payer: Self-pay | Admitting: Internal Medicine

## 2016-09-07 ENCOUNTER — Ambulatory Visit (INDEPENDENT_AMBULATORY_CARE_PROVIDER_SITE_OTHER): Payer: Medicare Other | Admitting: Sports Medicine

## 2016-09-07 DIAGNOSIS — M1611 Unilateral primary osteoarthritis, right hip: Secondary | ICD-10-CM

## 2016-09-07 NOTE — Assessment & Plan Note (Signed)
My review of these XRays show more significant change than noted on interpretation.  The lower 50% of joint has total loss of joint space with sclerosis and likely bone on bone change.  A large spur likely blocks normal internal rotation.  Her muscular testing was not remarkable.  Her best option is continued conservative care with Ibuprofen and regular exercise mostly on bike and in pool.  Some walking is OK.  We can do US guided CS injection if pain worsens.

## 2016-09-07 NOTE — Progress Notes (Signed)
  Morgan Huff - 71 y.o. female MRN [id number]  Date of birth: [birth year]  SUBJECTIVE:  Including CC & ROS.  No chief complaint on file. Morgan Huff is a 71 yo F w/ no significant PMH in for evaluation of "hip pain." Patient followed by PCP for hip OA but presents today for consultation given increase in weakness of R. Leg w/ associated pain.  Describes "dull" 5/10 pain in groin worsened w/ internal rotation of hip. Reports associated weakness to R. Hip flexion along w/ decrease in ROM. Denies numbness or tingling. Denies feelings of instability. Pain partially improved w/ 400mg  ibuprofen BID.   ROS- no low back pain/ no sciatica/ no issues of pain with left hip motion   HISTORY: Past Medical, Surgical, Social, and Family History Reviewed & Updated per EMR.   Pertinent Historical Findings include: Plantar Fasciitis  DATA REVIEWED: XR Hip (04/12/16)- 1. Moderate right hip osteoarthritis. 2.  No acute osseous abnormality identified.  Over-read by me today (09/07/16)- Severe R Hip OA w/ significant spurring that would limit IR/ there is also bone on bone and sclerotic change in lower 50% of joint space.  Interpreted by Ila Mcgill, MD  PHYSICAL EXAM:  VS: BP:(!) 153/87  HR: bpm  TEMP: ( )  RESP:   HT:6\' 2"  (188 cm)   WT:260 lb (117.9 kg)  BMI:33.5 PHYSICAL EXAM:  General: well appearing F in NAD, A&Ox3  MSK: - nl bulk and tone, no obvious swelling - no PTP over greater trochanter - Hip flexion decreased by 20 degrees on R - Arc of Internal/External rotation decreased by 40 degrees on R There is 10 deg of IR and only 30 deg of ER Comparison is that left hip has 30 deg IR and 40 deg ER - 5/5 strength to hip flexion/extension/abduction/adduction - 4/5 strength to internal rotation on R.  - Trendelenburg gait  - 1 cm leg length discrepancy w/ R leg shorter than L  ASSESSMENT & PLAN: See problem based charting & AVS for pt instructions. 71 yo F w/ chronic R. Hip pain partially  controlled w/ NSAIDs w/ evidence of severe OA on exam and XR. Pt has Stage 4 OA of hip. Performed shared decision making covering options including surgical arthroplasty and conservative management of NSAIDs and exercises. Given NSAIDs appear to provide relief, will continue w/ conservative management at this time.   1. Continue NSAIDs up to 600mg  BID PRN pain 2. Perform Hip ROM exercises 3. Perform other exercise such as water aerobics or biking in partial recumbence as tolerated.  4. Follow-up in 4-6 weeks to assess progress

## 2016-10-20 DIAGNOSIS — Z1231 Encounter for screening mammogram for malignant neoplasm of breast: Secondary | ICD-10-CM | POA: Diagnosis not present

## 2016-10-20 LAB — HM MAMMOGRAPHY

## 2016-10-22 ENCOUNTER — Encounter: Payer: Self-pay | Admitting: Internal Medicine

## 2016-11-01 DIAGNOSIS — H401331 Pigmentary glaucoma, bilateral, mild stage: Secondary | ICD-10-CM | POA: Diagnosis not present

## 2016-12-08 ENCOUNTER — Ambulatory Visit (INDEPENDENT_AMBULATORY_CARE_PROVIDER_SITE_OTHER): Payer: Medicare Other | Admitting: Internal Medicine

## 2016-12-08 ENCOUNTER — Other Ambulatory Visit (INDEPENDENT_AMBULATORY_CARE_PROVIDER_SITE_OTHER): Payer: Medicare Other

## 2016-12-08 ENCOUNTER — Encounter: Payer: Self-pay | Admitting: Internal Medicine

## 2016-12-08 DIAGNOSIS — D751 Secondary polycythemia: Secondary | ICD-10-CM

## 2016-12-08 DIAGNOSIS — E782 Mixed hyperlipidemia: Secondary | ICD-10-CM

## 2016-12-08 DIAGNOSIS — R739 Hyperglycemia, unspecified: Secondary | ICD-10-CM

## 2016-12-08 DIAGNOSIS — M1611 Unilateral primary osteoarthritis, right hip: Secondary | ICD-10-CM | POA: Diagnosis not present

## 2016-12-08 LAB — CBC WITH DIFFERENTIAL/PLATELET
BASOS ABS: 0.1 10*3/uL (ref 0.0–0.1)
Basophils Relative: 0.6 % (ref 0.0–3.0)
EOS ABS: 0.2 10*3/uL (ref 0.0–0.7)
Eosinophils Relative: 2.2 % (ref 0.0–5.0)
HCT: 45.1 % (ref 36.0–46.0)
Hemoglobin: 14.9 g/dL (ref 12.0–15.0)
LYMPHS ABS: 2.4 10*3/uL (ref 0.7–4.0)
LYMPHS PCT: 25.8 % (ref 12.0–46.0)
MCHC: 33 g/dL (ref 30.0–36.0)
MCV: 89.4 fl (ref 78.0–100.0)
MONO ABS: 0.9 10*3/uL (ref 0.1–1.0)
Monocytes Relative: 9.2 % (ref 3.0–12.0)
NEUTROS ABS: 5.7 10*3/uL (ref 1.4–7.7)
Neutrophils Relative %: 62.2 % (ref 43.0–77.0)
PLATELETS: 342 10*3/uL (ref 150.0–400.0)
RBC: 5.05 Mil/uL (ref 3.87–5.11)
RDW: 13.8 % (ref 11.5–15.5)
WBC: 9.2 10*3/uL (ref 4.0–10.5)

## 2016-12-08 LAB — BASIC METABOLIC PANEL
BUN: 26 mg/dL — AB (ref 6–23)
CALCIUM: 10.3 mg/dL (ref 8.4–10.5)
CHLORIDE: 103 meq/L (ref 96–112)
CO2: 32 meq/L (ref 19–32)
CREATININE: 0.85 mg/dL (ref 0.40–1.20)
GFR: 70.05 mL/min (ref >60.00)
Glucose, Bld: 98 mg/dL (ref 70–99)
Potassium: 4.4 mEq/L (ref 3.5–5.1)
Sodium: 139 mEq/L (ref 135–145)

## 2016-12-08 LAB — HEMOGLOBIN A1C: HEMOGLOBIN A1C: 6.2 % (ref 4.6–6.5)

## 2016-12-08 MED ORDER — TRAMADOL HCL 50 MG PO TABS: 50.0000 mg | ORAL_TABLET | Freq: Three times a day (TID) | ORAL | 2 refills | Status: DC | PRN

## 2016-12-08 MED ORDER — ZOSTER VAC RECOMB ADJUVANTED 50 MCG/0.5ML IM SUSR: 0.5000 mL | Freq: Once | INTRAMUSCULAR | 1 refills | Status: AC

## 2016-12-08 MED ORDER — IBUPROFEN 600 MG PO TABS: 600.0000 mg | ORAL_TABLET | Freq: Two times a day (BID) | ORAL | 0 refills | Status: DC | PRN

## 2016-12-08 NOTE — Assessment & Plan Note (Signed)
Labs

## 2016-12-08 NOTE — Progress Notes (Signed)
Subjective:  Patient ID: Morgan Huff, female    DOB: 1946/01/01  Age: 71 y.o. MRN: [id number]  CC: No chief complaint on file.   HPI Morgan Huff presents for OA, elev glucose and dyslipidemia f/u  Outpatient Medications Prior to Visit  Medication Sig Dispense Refill  . Ascorbic Acid (VITAMIN C) 1000 MG tablet Take 1,000 mg by mouth daily.      Marland Kitchen aspirin 81 MG tablet Take 81 mg by mouth daily.    . cholecalciferol (VITAMIN D) 1000 units tablet Take 2 tablets (2,000 Units total) by mouth daily. 100 tablet 3  . ibuprofen (ADVIL,MOTRIN) 600 MG tablet Take 1 tablet (600 mg total) by mouth 2 (two) times daily as needed for moderate pain. 180 tablet 0  . Multiple Vitamins-Minerals (CENTRUM SILVER PO) Take 1 tablet by mouth daily.      Vladimir Faster Glycol-Propyl Glycol (SYSTANE OP) Apply to eye daily as needed.    . simvastatin (ZOCOR) 10 MG tablet Take 1 tablet (10 mg total) by mouth daily. 90 tablet 3  . timolol (BETIMOL) 0.5 % ophthalmic solution Place 1 drop into the left eye daily.     . Calcium Carbonate Antacid (TUMS ULTRA 1000 PO) Take 2 tablets by mouth daily.      . timolol (TIMOPTIC) 0.5 % ophthalmic solution Place 1 drop into the left eye daily.    . timolol (TIMOPTIC) 0.5 % ophthalmic solution      No facility-administered medications prior to visit.     ROS Review of Systems  Constitutional: Negative for activity change, appetite change, chills, fatigue and unexpected weight change.  HENT: Negative for congestion, mouth sores and sinus pressure.   Eyes: Negative for visual disturbance.  Respiratory: Negative for cough and chest tightness.   Gastrointestinal: Negative for abdominal pain and nausea.  Genitourinary: Negative for difficulty urinating, frequency and vaginal pain.  Musculoskeletal: Positive for arthralgias and gait problem. Negative for back pain.  Skin: Negative for pallor and rash.  Neurological: Negative for dizziness, tremors, weakness, numbness  and headaches.  Psychiatric/Behavioral: Negative for confusion and sleep disturbance.    Objective:  BP 136/84 (BP Location: Left Arm, Patient Position: Sitting, Cuff Size: Large)   Pulse 67   Temp 97.8 F (36.6 C) (Oral)   Ht 6\' 2"  (1.88 m)   Wt 261 lb 1.3 oz (118.4 kg)   SpO2 98%   BMI 33.52 kg/m   BP Readings from Last 3 Encounters:  12/08/16 136/84  09/07/16 (!) 153/87  06/11/16 140/90    Wt Readings from Last 3 Encounters:  12/08/16 261 lb 1.3 oz (118.4 kg)  09/07/16 260 lb (117.9 kg)  06/11/16 260 lb (117.9 kg)    Physical Exam  Constitutional: She appears well-developed. No distress.  HENT:  Head: Normocephalic.  Right Ear: External ear normal.  Left Ear: External ear normal.  Nose: Nose normal.  Mouth/Throat: Oropharynx is clear and moist.  Eyes: Conjunctivae are normal. Pupils are equal, round, and reactive to light. Right eye exhibits no discharge. Left eye exhibits no discharge.  Neck: Normal range of motion. Neck supple. No JVD present. No tracheal deviation present. No thyromegaly present.  Cardiovascular: Normal rate, regular rhythm and normal heart sounds.   Pulmonary/Chest: No stridor. No respiratory distress. She has no wheezes.  Abdominal: Soft. Bowel sounds are normal. She exhibits no distension and no mass. There is no tenderness. There is no rebound and no guarding.  Musculoskeletal: She exhibits tenderness. She exhibits no edema.  Lymphadenopathy:    She has no cervical adenopathy.  Neurological: She displays normal reflexes. No cranial nerve deficit. She exhibits normal muscle tone. Coordination normal.  Skin: No rash noted. No erythema.  Psychiatric: She has a normal mood and affect. Her behavior is normal. Judgment and thought content normal.    Lab Results  Component Value Date   WBC 8.9 04/12/2016   HGB 15.4 (H) 04/12/2016   HCT 45.1 04/12/2016   PLT 305.0 04/12/2016   GLUCOSE 117 (H) 04/12/2016   CHOL 152 04/12/2016   TRIG 150.0 (H)  04/12/2016   HDL 41.70 04/12/2016   LDLDIRECT 85.6 02/20/2008   LDLCALC 80 04/12/2016   ALT 11 04/12/2016   AST 16 04/12/2016   NA 141 04/12/2016   K 4.7 04/12/2016   CL 106 04/12/2016   CREATININE 0.87 04/12/2016   BUN 17 04/12/2016   CO2 29 04/12/2016   TSH 2.52 04/12/2016   HGBA1C 5.9 04/12/2016   MICROALBUR 0.7 04/12/2016    Dg Hip Unilat With Pelvis 2-3 Views Right  Result Date: 04/12/2016 CLINICAL DATA:  71 year old female with right hip and groin pain and stiffness for 3 months with no known injury. Severe right hip pain. Initial encounter. EXAM: DG HIP (WITH OR WITHOUT PELVIS) 2-3V RIGHT COMPARISON:  None. FINDINGS: Moderate medial/axial hip joint space loss on the right with moderate to severe osteophytosis, and asymmetric increased subchondral sclerosis. Left hip joint space better preserved. Proximal left femur appears normal for age. Pelvis appears intact. Sacral ala and SI joints appear normal. Lower lumbar spine degeneration partially visible. Proximal right femur is intact. IMPRESSION: 1. Moderate right hip osteoarthritis. 2.  No acute osseous abnormality identified. Electronically Signed   By: Genevie Ann M.D.   On: 04/12/2016 16:07    Assessment & Plan:   There are no diagnoses linked to this encounter. I have discontinued Ms. Vieth's Calcium Carbonate Antacid (TUMS ULTRA 1000 PO). I am also having her maintain her Multiple Vitamins-Minerals (CENTRUM SILVER PO), vitamin C, timolol, aspirin, Polyethyl Glycol-Propyl Glycol (SYSTANE OP), cholecalciferol, simvastatin, ibuprofen, Biotin, and calcium carbonate.  Meds ordered this encounter  Medications  . Biotin 5 MG TBDP    Sig: Take by mouth.  . calcium carbonate (TUMS - DOSED IN MG ELEMENTAL CALCIUM) 500 MG chewable tablet    Sig: Chew 1 tablet by mouth daily.     Follow-up: No Follow-up on file.  Walker Kehr, MD

## 2016-12-08 NOTE — Patient Instructions (Signed)
MC well w/Jill 

## 2016-12-08 NOTE — Assessment & Plan Note (Signed)
Tramadol and ibuprofen prn

## 2016-12-08 NOTE — Assessment & Plan Note (Signed)
Labs On Lipitor

## 2017-01-14 DIAGNOSIS — H401331 Pigmentary glaucoma, bilateral, mild stage: Secondary | ICD-10-CM | POA: Diagnosis not present

## 2017-01-31 DIAGNOSIS — Z79899 Other long term (current) drug therapy: Secondary | ICD-10-CM | POA: Diagnosis not present

## 2017-01-31 DIAGNOSIS — H401331 Pigmentary glaucoma, bilateral, mild stage: Secondary | ICD-10-CM | POA: Diagnosis not present

## 2017-02-03 DIAGNOSIS — L821 Other seborrheic keratosis: Secondary | ICD-10-CM | POA: Diagnosis not present

## 2017-02-03 DIAGNOSIS — L820 Inflamed seborrheic keratosis: Secondary | ICD-10-CM | POA: Diagnosis not present

## 2017-02-03 DIAGNOSIS — L710 Perioral dermatitis: Secondary | ICD-10-CM | POA: Diagnosis not present

## 2017-02-03 DIAGNOSIS — L570 Actinic keratosis: Secondary | ICD-10-CM | POA: Diagnosis not present

## 2017-02-03 DIAGNOSIS — L218 Other seborrheic dermatitis: Secondary | ICD-10-CM | POA: Diagnosis not present

## 2017-02-03 DIAGNOSIS — R21 Rash and other nonspecific skin eruption: Secondary | ICD-10-CM | POA: Diagnosis not present

## 2017-04-12 NOTE — Progress Notes (Signed)
Pre visit review using our clinic review tool, if applicable. No additional management support is needed unless otherwise documented below in the visit note. 

## 2017-04-12 NOTE — Progress Notes (Addendum)
Subjective:   Morgan Huff is a 71 y.o. female who presents for Medicare Annual (Subsequent) preventive examination.  Review of Systems:  No ROS.  Medicare Wellness Visit. Additional risk factors are reflected in the social history.  Cardiac Risk Factors include: advanced age (>56men, >69 women);dyslipidemia;hypertension Sleep patterns: feels rested on waking, gets up 1 times nightly to void and sleeps 8-9 hours nightly.    Home Safety/Smoke Alarms: Feels safe in home. Smoke alarms in place.  Living environment; residence and Firearm Safety: apartment, no firearms. [facility], Lives alone, no needs for DME, good support system Seat Belt Safety/Bike Helmet: Wears seat belt.     Objective:     Vitals: BP 136/78   Pulse 77   Resp 20   Ht 6\' 2"  (1.88 m)   Wt 262 lb (118.8 kg)   SpO2 98%   BMI 33.64 kg/m   Body mass index is 33.64 kg/m.   Tobacco History  Smoking Status  . Never Smoker  Smokeless Tobacco  . Never Used     Counseling given: Not Answered   Past Medical History:  Diagnosis Date  . Colon polyps   . Diverticulosis of colon   . Fasting hyperglycemia   . Glaucoma    Dr Edilia Bo  . Hyperlipidemia    LDL goal = < 100; Lp(a) 112; hyperabsorber   Past Surgical History:  Procedure Laterality Date  . ANTERIOR AND POSTERIOR REPAIR WITH SACROSPINOUS FIXATION N/A 06/05/2015   Procedure: ANTERIOR AND POSTERIOR REPAIR WITH SACROSPINOUS LIGAMENT SUSPENSION;  Surgeon: Louretta Shorten, MD;  Location: [facility];  Service: Gynecology;  Laterality: N/A;  . CATARACT EXTRACTION, BILATERAL     lens implant  . CHOLECYSTECTOMY    . CHOLECYSTECTOMY, LAPAROSCOPIC     Dr Margot Chimes  . COLONOSCOPY  2015   neg; due 2020  . colonoscopy with polypectomy     Dr Watt Climes; X 3  . EYE SURGERY     trabeculectomy ;Dr Kendall Flack.Cataract removal OD; Dr Scot Dock   Family History  Problem Relation Age of Onset  . Polycythemia Father        transition into leukemia  .  Hypokalemia Father   . Leukemia Father   . Glaucoma Father   . Heart attack Father 63       in context hypokalemia  . Lung cancer Mother        smoker  . Heart attack Maternal Grandfather 73  . Breast cancer Maternal Grandmother   . Melanoma Brother   . Diabetes Neg Hx   . Stroke Neg Hx    History  Sexual Activity  . Sexual activity: Not on file    Outpatient Encounter Prescriptions as of 04/13/2017  Medication Sig  . Ascorbic Acid (VITAMIN C) 1000 MG tablet Take 1,000 mg by mouth daily.    Marland Kitchen aspirin 81 MG tablet Take 81 mg by mouth daily.  . Biotin 5 MG TBDP Take by mouth.  . calcium carbonate (TUMS - DOSED IN MG ELEMENTAL CALCIUM) 500 MG chewable tablet Chew 1 tablet by mouth daily.  . cholecalciferol (VITAMIN D) 1000 units tablet Take 2 tablets (2,000 Units total) by mouth daily.  Marland Kitchen ibuprofen (ADVIL,MOTRIN) 600 MG tablet Take 1 tablet (600 mg total) by mouth 2 (two) times daily as needed for moderate pain.  . Multiple Vitamins-Minerals (CENTRUM SILVER PO) Take 1 tablet by mouth daily.    Vladimir Faster Glycol-Propyl Glycol (SYSTANE OP) Apply to eye daily as needed.  . simvastatin (  ZOCOR) 10 MG tablet Take 1 tablet (10 mg total) by mouth daily.  . timolol (BETIMOL) 0.5 % ophthalmic solution Place 1 drop into the left eye daily.   . traMADol (ULTRAM) 50 MG tablet Take 1-2 tablets (50-100 mg total) by mouth every 8 (eight) hours as needed.   No facility-administered encounter medications on file as of 04/13/2017.     Activities of Daily Living In your present state of health, do you have any difficulty performing the following activities: 04/13/2017  Hearing? N  Vision? N  Difficulty concentrating or making decisions? N  Walking or climbing stairs? N  Dressing or bathing? N  Doing errands, shopping? N  Preparing Food and eating ? N  Using the Toilet? N  In the past six months, have you accidently leaked urine? N  Do you have problems with loss of bowel control? N  Managing  your Medications? N  Managing your Finances? N  Housekeeping or managing your Housekeeping? N  Some recent data might be hidden    Patient Care Team: Plotnikov, Evie Lacks, MD as PCP - General (Internal Medicine) Clarene Essex, MD as Consulting Physician (Gastroenterology) Louretta Shorten, MD as Consulting Physician (Obstetrics and Gynecology) Stefanie Libel, MD as Consulting Physician (Sports Medicine)    Assessment:    Physical assessment deferred to PCP.  Exercise Activities and Dietary recommendations Current Exercise Habits: Structured exercise class, Type of exercise: yoga;walking, Time (Minutes): 40, Frequency (Times/Week): 3, Weekly Exercise (Minutes/Week): 120, Intensity: Mild, Exercise limited by: None identified  Diet (meal preparation, eat out, water intake, caffeinated beverages, dairy products, fruits and vegetables): in general, a "healthy" diet  , well balanced, eats a variety of fruits and vegetables daily, limits salt, fat/cholesterol, sugar, caffeine, drinks 6-8 glasses of water daily.   Goals    . I want to be more comfortable with phsical activity          Increase my exercise in the body areas that I feel weaker and take advantage of the new gym facility at [facility]  04/13/2017 09/07/2016 04/12/2016 04/11/2015 04/06/2013  Falls in the past year? Yes No No No Yes  Number falls in past yr: - - - - 2 or more  Injury with Fall? No - - - -  Risk for fall due to : - Other (Comment) - - -   Depression Screen PHQ 2/9 Scores 04/13/2017 09/07/2016 04/12/2016 04/11/2015  PHQ - 2 Score 0 0 0 0  PHQ- 9 Score 0 - - -  Exception Documentation - Other- indicate reason in comment box - -     Cognitive Function MMSE - Mini Mental State Exam 04/13/2017  Orientation to time 5  Orientation to Place 5  Registration 3  Attention/ Calculation 5  Recall 3  Language- name 2 objects 2  Language- repeat 1  Language- follow 3 step command 3  Language- read & follow  direction 1  Write a sentence 1  Copy design 1  Total score 30        Immunization History  Administered Date(s) Administered  . Influenza Whole 05/02/2012, 05/02/2013  . Influenza, High Dose Seasonal PF 04/11/2014, 04/12/2016, 04/13/2017  . Influenza-Unspecified 04/27/2015  . Pneumococcal Conjugate-13 04/11/2015  . Pneumococcal Polysaccharide-23 03/17/2011  . Td 02/20/2008  . Zoster 08/02/2010   Screening Tests Health Maintenance  Topic Date Due  . INFLUENZA VACCINE  03/02/2017  . TETANUS/TDAP  02/19/2018  . MAMMOGRAM  10/21/2018  .  COLONOSCOPY  10/16/2024  . DEXA SCAN  Completed  . Hepatitis C Screening  Completed  . PNA vac Low Risk Adult  Completed      Plan:     Continue doing brain stimulating activities (puzzles, reading, adult coloring books, staying active) to keep memory sharp.   Continue to eat heart healthy diet (full of fruits, vegetables, whole grains, lean protein, water--limit salt, fat, and sugar intake) and increase physical activity as tolerated.  I have personally reviewed and noted the following in the patient's chart:   . Medical and social history . Use of alcohol, tobacco or illicit drugs  . Current medications and supplements . Functional ability and status . Nutritional status . Physical activity . Advanced directives . List of other physicians . Vitals . Screenings to include cognitive, depression, and falls . Referrals and appointments  In addition, I have reviewed and discussed with patient certain preventive protocols, quality metrics, and best practice recommendations. A written personalized care plan for preventive services as well as general preventive health recommendations were provided to patient.     Michiel Cowboy, RN  04/13/2017   Medical screening examination/treatment/procedure(s) were performed by non-physician practitioner and as supervising physician I was immediately available for consultation/collaboration. I agree  with above. Lew Dawes, MD

## 2017-04-13 ENCOUNTER — Ambulatory Visit (INDEPENDENT_AMBULATORY_CARE_PROVIDER_SITE_OTHER): Payer: Medicare Other | Admitting: *Deleted

## 2017-04-13 ENCOUNTER — Ambulatory Visit: Payer: Medicare Other | Admitting: Internal Medicine

## 2017-04-13 VITALS — BP 136/78 | HR 77 | Resp 20 | Ht 74.0 in | Wt 262.0 lb

## 2017-04-13 DIAGNOSIS — Z0000 Encounter for general adult medical examination without abnormal findings: Secondary | ICD-10-CM | POA: Diagnosis not present

## 2017-04-13 DIAGNOSIS — Z23 Encounter for immunization: Secondary | ICD-10-CM | POA: Diagnosis not present

## 2017-04-13 NOTE — Patient Instructions (Addendum)
Continue doing brain stimulating activities (puzzles, reading, adult coloring books, staying active) to keep memory sharp.   Continue to eat heart healthy diet (full of fruits, vegetables, whole grains, lean protein, water--limit salt, fat, and sugar intake) and increase physical activity as tolerated.    Morgan Huff , Thank you for taking time to come for your Medicare Wellness Visit. I appreciate your ongoing commitment to your health goals. Please review the following plan we discussed and let me know if I can assist you in the future.   These are the goals we discussed: Goals    . I want to be more comfortable with phsical activity          Increase my exercise in the body areas that I feel weaker and take advantage of the new gym facility at [facility]       This is a list of the screening recommended for you and due dates:  Health Maintenance  Topic Date Due  . Flu Shot  03/02/2017  . Tetanus Vaccine  02/19/2018  . Mammogram  10/21/2018  . Colon Cancer Screening  10/16/2024  . DEXA scan (bone density measurement)  Completed  .  Hepatitis C: One time screening is recommended by Center for Disease Control  (CDC) for  adults born from [birth year] through 1965.   Completed  . Pneumonia vaccines  Completed   Influenza Virus Vaccine injection What is this medicine? INFLUENZA VIRUS VACCINE (in floo EN zuh VAHY ruhs vak SEEN) helps to reduce the risk of getting influenza also known as the flu. The vaccine only helps protect you against some strains of the flu. This medicine may be used for other purposes; ask your health care provider or pharmacist if you have questions. COMMON BRAND NAME(S): Afluria, Agriflu, Alfuria, FLUAD, Fluarix, Fluarix Quadrivalent, Flublok, Flublok Quadrivalent, FLUCELVAX, Flulaval, Fluvirin, Fluzone, Fluzone High-Dose, Fluzone Intradermal What should I tell my health care provider before I take this medicine? They need to know if you have any of these  conditions: -bleeding disorder like hemophilia -fever or infection -Guillain-Barre syndrome or other neurological problems -immune system problems -infection with the human immunodeficiency virus (HIV) or AIDS -low blood platelet counts -multiple sclerosis -an unusual or allergic reaction to influenza virus vaccine, latex, other medicines, foods, dyes, or preservatives. Different brands of vaccines contain different allergens. Some may contain latex or eggs. Talk to your doctor about your allergies to make sure that you get the right vaccine. -pregnant or trying to get pregnant -breast-feeding How should I use this medicine? This vaccine is for injection into a muscle or under the skin. It is given by a health care professional. A copy of Vaccine Information Statements will be given before each vaccination. Read this sheet carefully each time. The sheet may change frequently. Talk to your healthcare provider to see which vaccines are right for you. Some vaccines should not be used in all age groups. Overdosage: If you think you have taken too much of this medicine contact a poison control center or emergency room at once. NOTE: This medicine is only for you. Do not share this medicine with others. What if I miss a dose? This does not apply. What may interact with this medicine? -chemotherapy or radiation therapy -medicines that lower your immune system like etanercept, anakinra, infliximab, and adalimumab -medicines that treat or prevent blood clots like warfarin -phenytoin -steroid medicines like prednisone or cortisone -theophylline -vaccines This list may not describe all possible interactions. Give your health care  provider a list of all the medicines, herbs, non-prescription drugs, or dietary supplements you use. Also tell them if you smoke, drink alcohol, or use illegal drugs. Some items may interact with your medicine. What should I watch for while using this medicine? Report any  side effects that do not go away within 3 days to your doctor or health care professional. Call your health care provider if any unusual symptoms occur within 6 weeks of receiving this vaccine. You may still catch the flu, but the illness is not usually as bad. You cannot get the flu from the vaccine. The vaccine will not protect against colds or other illnesses that may cause fever. The vaccine is needed every year. What side effects may I notice from receiving this medicine? Side effects that you should report to your doctor or health care professional as soon as possible: -allergic reactions like skin rash, itching or hives, swelling of the face, lips, or tongue Side effects that usually do not require medical attention (report to your doctor or health care professional if they continue or are bothersome): -fever -headache -muscle aches and pains -pain, tenderness, redness, or swelling at the injection site -tiredness This list may not describe all possible side effects. Call your doctor for medical advice about side effects. You may report side effects to FDA at 1-800-FDA-1088. Where should I keep my medicine? The vaccine will be given by a health care professional in a clinic, pharmacy, doctor's office, or other health care setting. You will not be given vaccine doses to store at home. NOTE: This sheet is a summary. It may not cover all possible information. If you have questions about this medicine, talk to your doctor, pharmacist, or health care provider.  2018 Elsevier/Gold Standard (2015-02-07 10:07:28)

## 2017-04-14 ENCOUNTER — Telehealth: Payer: Self-pay | Admitting: *Deleted

## 2017-04-14 NOTE — Telephone Encounter (Signed)
During AWV, patient stated she would like refills for simvastatin and tramadol sent to [facility]. She wanted to also ask you about the Shingrix vaccine stating that when she spoke to you earlier about it you indicated that you wanted to see how well the vaccine is working before you started to prescribe. Patient states that if you think the vaccine would be beneficial to please send prescription to [facility] that is included on her pharmacy list.

## 2017-04-14 NOTE — Telephone Encounter (Signed)
Ok to fill Tramadol w/2 ref, Simvastatin w/11 ref [facility] Shingrix Thx

## 2017-04-18 DIAGNOSIS — Z6833 Body mass index (BMI) 33.0-33.9, adult: Secondary | ICD-10-CM | POA: Diagnosis not present

## 2017-04-18 DIAGNOSIS — Z01419 Encounter for gynecological examination (general) (routine) without abnormal findings: Secondary | ICD-10-CM | POA: Diagnosis not present

## 2017-04-18 MED ORDER — ZOSTER VAC RECOMB ADJUVANTED 50 MCG/0.5ML IM SUSR: 0.5000 mL | Freq: Once | INTRAMUSCULAR | 1 refills | Status: AC

## 2017-04-18 MED ORDER — SIMVASTATIN 10 MG PO TABS: 10.0000 mg | ORAL_TABLET | Freq: Every day | ORAL | 3 refills | Status: DC

## 2017-04-18 MED ORDER — TRAMADOL HCL 50 MG PO TABS: 50.0000 mg | ORAL_TABLET | Freq: Three times a day (TID) | ORAL | 2 refills | Status: DC | PRN

## 2017-04-18 NOTE — Telephone Encounter (Signed)
Called and LVM to inform patient that prescriptions for tramadol and simvastin were [facility] and the shingrix prescription was sent to [facility] as requested.

## 2017-04-20 ENCOUNTER — Telehealth: Payer: Self-pay | Admitting: *Deleted

## 2017-04-20 NOTE — Telephone Encounter (Signed)
Called patient back to in response to the VM she left. Patient wanted to ensure that Dr. Alain Marion thought it was a good idea that she get the Shingrix vaccination as she has heard negative feedback about the vaccine. Patient also wanted to inform PCP that she had an appointment with Dr. Berenice Primas from orthopedics to evaluate her hip pain. Nurse advised patient how to contact medical records to get a copy of her x-ray.

## 2017-04-21 DIAGNOSIS — M1611 Unilateral primary osteoarthritis, right hip: Secondary | ICD-10-CM | POA: Diagnosis not present

## 2017-04-21 NOTE — Telephone Encounter (Signed)
OK to get a Shingrix shot. Any vaccine may have side effects Thx

## 2017-05-02 DIAGNOSIS — M1611 Unilateral primary osteoarthritis, right hip: Secondary | ICD-10-CM | POA: Diagnosis not present

## 2017-05-02 DIAGNOSIS — M25551 Pain in right hip: Secondary | ICD-10-CM | POA: Diagnosis not present

## 2017-05-02 DIAGNOSIS — M25651 Stiffness of right hip, not elsewhere classified: Secondary | ICD-10-CM | POA: Diagnosis not present

## 2017-06-06 DIAGNOSIS — H44421 Hypotony of right eye due to ocular fistula: Secondary | ICD-10-CM | POA: Diagnosis not present

## 2017-06-06 DIAGNOSIS — H401331 Pigmentary glaucoma, bilateral, mild stage: Secondary | ICD-10-CM | POA: Diagnosis not present

## 2017-06-08 DIAGNOSIS — L438 Other lichen planus: Secondary | ICD-10-CM | POA: Diagnosis not present

## 2017-06-08 DIAGNOSIS — D485 Neoplasm of uncertain behavior of skin: Secondary | ICD-10-CM | POA: Diagnosis not present

## 2017-06-08 DIAGNOSIS — L720 Epidermal cyst: Secondary | ICD-10-CM | POA: Diagnosis not present

## 2017-06-08 DIAGNOSIS — D225 Melanocytic nevi of trunk: Secondary | ICD-10-CM | POA: Diagnosis not present

## 2017-06-08 DIAGNOSIS — L570 Actinic keratosis: Secondary | ICD-10-CM | POA: Diagnosis not present

## 2017-06-08 DIAGNOSIS — L820 Inflamed seborrheic keratosis: Secondary | ICD-10-CM | POA: Diagnosis not present

## 2017-06-08 DIAGNOSIS — D2371 Other benign neoplasm of skin of right lower limb, including hip: Secondary | ICD-10-CM | POA: Diagnosis not present

## 2017-06-08 DIAGNOSIS — L821 Other seborrheic keratosis: Secondary | ICD-10-CM | POA: Diagnosis not present

## 2017-06-10 ENCOUNTER — Ambulatory Visit (INDEPENDENT_AMBULATORY_CARE_PROVIDER_SITE_OTHER): Payer: Medicare Other | Admitting: Internal Medicine

## 2017-06-10 ENCOUNTER — Other Ambulatory Visit (INDEPENDENT_AMBULATORY_CARE_PROVIDER_SITE_OTHER): Payer: Medicare Other

## 2017-06-10 ENCOUNTER — Encounter: Payer: Self-pay | Admitting: Internal Medicine

## 2017-06-10 DIAGNOSIS — E782 Mixed hyperlipidemia: Secondary | ICD-10-CM

## 2017-06-10 DIAGNOSIS — D751 Secondary polycythemia: Secondary | ICD-10-CM | POA: Diagnosis not present

## 2017-06-10 DIAGNOSIS — R739 Hyperglycemia, unspecified: Secondary | ICD-10-CM

## 2017-06-10 DIAGNOSIS — M1611 Unilateral primary osteoarthritis, right hip: Secondary | ICD-10-CM | POA: Diagnosis not present

## 2017-06-10 LAB — CBC WITH DIFFERENTIAL/PLATELET
BASOS PCT: 0.7 % (ref 0.0–3.0)
Basophils Absolute: 0.1 10*3/uL (ref 0.0–0.1)
EOS PCT: 3.8 % (ref 0.0–5.0)
Eosinophils Absolute: 0.4 10*3/uL (ref 0.0–0.7)
HCT: 46.8 % — ABNORMAL HIGH (ref 36.0–46.0)
Hemoglobin: 15.3 g/dL — ABNORMAL HIGH (ref 12.0–15.0)
LYMPHS PCT: 28.7 % (ref 12.0–46.0)
Lymphs Abs: 2.7 10*3/uL (ref 0.7–4.0)
MCHC: 32.6 g/dL (ref 30.0–36.0)
MCV: 91.7 fl (ref 78.0–100.0)
MONOS PCT: 10.4 % (ref 3.0–12.0)
Monocytes Absolute: 1 10*3/uL (ref 0.1–1.0)
NEUTROS PCT: 56.4 % (ref 43.0–77.0)
Neutro Abs: 5.3 10*3/uL (ref 1.4–7.7)
Platelets: 303 10*3/uL (ref 150.0–400.0)
RBC: 5.1 Mil/uL (ref 3.87–5.11)
RDW: 13.6 % (ref 11.5–15.5)
WBC: 9.4 10*3/uL (ref 4.0–10.5)

## 2017-06-10 LAB — BASIC METABOLIC PANEL
BUN: 28 mg/dL — ABNORMAL HIGH (ref 6–23)
CALCIUM: 10.4 mg/dL (ref 8.4–10.5)
CO2: 30 meq/L (ref 19–32)
Chloride: 102 mEq/L (ref 96–112)
Creatinine, Ser: 0.82 mg/dL (ref 0.40–1.20)
GFR: 72.91 mL/min (ref >60.00)
Glucose, Bld: 112 mg/dL — ABNORMAL HIGH (ref 70–99)
POTASSIUM: 4.4 meq/L (ref 3.5–5.1)
SODIUM: 139 meq/L (ref 135–145)

## 2017-06-10 LAB — LIPID PANEL
CHOL/HDL RATIO: 3
CHOLESTEROL: 177 mg/dL (ref 0–200)
HDL: 52.9 mg/dL (ref >39.00)
LDL CALC: 94 mg/dL (ref 0–99)
NONHDL: 124.4
Triglycerides: 151 mg/dL — ABNORMAL HIGH (ref 0.0–149.0)
VLDL: 30.2 mg/dL (ref 0.0–40.0)

## 2017-06-10 LAB — HEPATIC FUNCTION PANEL
ALT: 16 U/L (ref 0–35)
AST: 18 U/L (ref 0–37)
Albumin: 4.2 g/dL (ref 3.5–5.2)
Alkaline Phosphatase: 67 U/L (ref 39–117)
BILIRUBIN DIRECT: 0.1 mg/dL (ref 0.0–0.3)
BILIRUBIN TOTAL: 0.6 mg/dL (ref 0.2–1.2)
Total Protein: 7.4 g/dL (ref 6.0–8.3)

## 2017-06-10 LAB — HEMOGLOBIN A1C: HEMOGLOBIN A1C: 6 % (ref 4.6–6.5)

## 2017-06-10 NOTE — Assessment & Plan Note (Signed)
Labs

## 2017-06-10 NOTE — Assessment & Plan Note (Signed)
  On diet  

## 2017-06-10 NOTE — Progress Notes (Signed)
Subjective:  Patient ID: Morgan Huff, female    DOB: [birth year]  Age: 71 y.o. MRN: [id number]  CC: No chief complaint on file.   HPI Morgan Huff presents for R hip OA, LBP - Dr Berenice Primas  Outpatient Medications Prior to Visit  Medication Sig Dispense Refill  . Ascorbic Acid (VITAMIN C) 1000 MG tablet Take 1,000 mg by mouth daily.      Marland Kitchen aspirin 81 MG tablet Take 81 mg by mouth daily.    . Biotin 5 MG TBDP Take by mouth.    . calcium carbonate (TUMS - DOSED IN MG ELEMENTAL CALCIUM) 500 MG chewable tablet Chew 1 tablet by mouth daily.    . cholecalciferol (VITAMIN D) 1000 units tablet Take 2 tablets (2,000 Units total) by mouth daily. 100 tablet 3  . erythromycin ophthalmic ointment Place into the right eye nightly.    Marland Kitchen ibuprofen (ADVIL,MOTRIN) 600 MG tablet Take 1 tablet (600 mg total) by mouth 2 (two) times daily as needed for moderate pain. 180 tablet 0  . moxifloxacin (VIGAMOX) 0.5 % ophthalmic solution Place 1 drop into the right eye 3 times daily.    . Multiple Vitamins-Minerals (CENTRUM SILVER PO) Take 1 tablet by mouth daily.      . Omega-3 Fatty Acids (FISH OIL) 1000 MG CAPS     . Polyethyl Glycol-Propyl Glycol (SYSTANE OP) Apply to eye daily as needed.    . simvastatin (ZOCOR) 10 MG tablet Take 1 tablet (10 mg total) by mouth daily. 90 tablet 3  . timolol (BETIMOL) 0.5 % ophthalmic solution Place 1 drop into the left eye daily.     . traMADol (ULTRAM) 50 MG tablet Take 1-2 tablets (50-100 mg total) by mouth every 8 (eight) hours as needed. 60 tablet 2  . Biotin 5 MG TBDP Take by mouth.     No facility-administered medications prior to visit.     ROS Review of Systems  Constitutional: Negative for activity change, appetite change, chills, fatigue and unexpected weight change.  HENT: Negative for congestion, mouth sores and sinus pressure.   Eyes: Negative for visual disturbance.  Respiratory: Negative for cough and chest tightness.   Gastrointestinal: Negative  for abdominal pain and nausea.  Genitourinary: Negative for difficulty urinating, frequency and vaginal pain.  Musculoskeletal: Positive for arthralgias, back pain and gait problem.  Skin: Negative for pallor and rash.  Neurological: Negative for dizziness, tremors, weakness, numbness and headaches.  Psychiatric/Behavioral: Negative for confusion and sleep disturbance.    Objective:  BP 126/82 (BP Location: Left Arm, Patient Position: Sitting, Cuff Size: Large)   Pulse 72   Temp 97.9 F (36.6 C) (Oral)   Ht 6\' 2"  (1.88 m)   Wt 266 lb (120.7 kg)   SpO2 98%   BMI 34.15 kg/m   BP Readings from Last 3 Encounters:  06/10/17 126/82  04/13/17 136/78  12/08/16 136/84    Wt Readings from Last 3 Encounters:  06/10/17 266 lb (120.7 kg)  04/13/17 262 lb (118.8 kg)  12/08/16 261 lb 1.3 oz (118.4 kg)    Physical Exam  Constitutional: She appears well-developed. No distress.  HENT:  Head: Normocephalic.  Right Ear: External ear normal.  Left Ear: External ear normal.  Nose: Nose normal.  Mouth/Throat: Oropharynx is clear and moist.  Eyes: Conjunctivae are normal. Pupils are equal, round, and reactive to light. Right eye exhibits no discharge. Left eye exhibits no discharge.  Neck: Normal range of motion. Neck supple. No JVD present.  No tracheal deviation present. No thyromegaly present.  Cardiovascular: Normal rate, regular rhythm and normal heart sounds.  Pulmonary/Chest: No stridor. No respiratory distress. She has no wheezes.  Abdominal: Soft. Bowel sounds are normal. She exhibits no distension and no mass. There is no tenderness. There is no rebound and no guarding.  Musculoskeletal: She exhibits tenderness. She exhibits no edema.  Lymphadenopathy:    She has no cervical adenopathy.  Neurological: She displays normal reflexes. No cranial nerve deficit. She exhibits normal muscle tone. Coordination abnormal.  Skin: No rash noted. No erythema.  Psychiatric: She has a normal mood  and affect. Her behavior is normal. Judgment and thought content normal.    Lab Results  Component Value Date   WBC 9.2 12/08/2016   HGB 14.9 12/08/2016   HCT 45.1 12/08/2016   PLT 342.0 12/08/2016   GLUCOSE 98 12/08/2016   CHOL 152 04/12/2016   TRIG 150.0 (H) 04/12/2016   HDL 41.70 04/12/2016   LDLDIRECT 85.6 02/20/2008   LDLCALC 80 04/12/2016   ALT 11 04/12/2016   AST 16 04/12/2016   NA 139 12/08/2016   K 4.4 12/08/2016   CL 103 12/08/2016   CREATININE 0.85 12/08/2016   BUN 26 (H) 12/08/2016   CO2 32 12/08/2016   TSH 2.52 04/12/2016   HGBA1C 6.2 12/08/2016   MICROALBUR 0.7 04/12/2016    Dg Hip Unilat With Pelvis 2-3 Views Right  Result Date: 04/12/2016 CLINICAL DATA:  71 year old female with right hip and groin pain and stiffness for 3 months with no known injury. Severe right hip pain. Initial encounter. EXAM: DG HIP (WITH OR WITHOUT PELVIS) 2-3V RIGHT COMPARISON:  None. FINDINGS: Moderate medial/axial hip joint space loss on the right with moderate to severe osteophytosis, and asymmetric increased subchondral sclerosis. Left hip joint space better preserved. Proximal left femur appears normal for age. Pelvis appears intact. Sacral ala and SI joints appear normal. Lower lumbar spine degeneration partially visible. Proximal right femur is intact. IMPRESSION: 1. Moderate right hip osteoarthritis. 2.  No acute osseous abnormality identified. Electronically Signed   By: Genevie Ann M.D.   On: 04/12/2016 16:07    Assessment & Plan:   There are no diagnoses linked to this encounter. I am having Morgan Huff "Morgan Huff" maintain her Multiple Vitamins-Minerals (CENTRUM SILVER PO), vitamin C, timolol, aspirin, Polyethyl Glycol-Propyl Glycol (SYSTANE OP), cholecalciferol, calcium carbonate, ibuprofen, simvastatin, traMADol, Fish Oil, Biotin, erythromycin, and moxifloxacin.  Meds ordered this encounter  Medications  . Omega-3 Fatty Acids (FISH OIL) 1000 MG CAPS  . Biotin 5 MG  TBDP    Sig: Take by mouth.  . erythromycin ophthalmic ointment    Sig: Place into the right eye nightly.  . moxifloxacin (VIGAMOX) 0.5 % ophthalmic solution    Sig: Place 1 drop into the right eye 3 times daily.     Follow-up: No Follow-up on file.  Walker Kehr, MD

## 2017-06-10 NOTE — Patient Instructions (Signed)
MC well w/Jill 

## 2017-06-20 DIAGNOSIS — H44421 Hypotony of right eye due to ocular fistula: Secondary | ICD-10-CM | POA: Diagnosis not present

## 2017-06-20 DIAGNOSIS — H4440 Unspecified hypotony of eye: Secondary | ICD-10-CM | POA: Diagnosis not present

## 2017-06-20 DIAGNOSIS — H401331 Pigmentary glaucoma, bilateral, mild stage: Secondary | ICD-10-CM | POA: Diagnosis not present

## 2017-06-20 HISTORY — DX: Hypotony of right eye due to ocular fistula: H44.421

## 2017-07-04 DIAGNOSIS — M25551 Pain in right hip: Secondary | ICD-10-CM | POA: Diagnosis not present

## 2017-07-12 DIAGNOSIS — R011 Cardiac murmur, unspecified: Secondary | ICD-10-CM | POA: Diagnosis not present

## 2017-07-12 DIAGNOSIS — H44421 Hypotony of right eye due to ocular fistula: Secondary | ICD-10-CM | POA: Diagnosis not present

## 2017-07-12 DIAGNOSIS — R51 Headache: Secondary | ICD-10-CM | POA: Diagnosis not present

## 2017-07-12 DIAGNOSIS — Z961 Presence of intraocular lens: Secondary | ICD-10-CM | POA: Diagnosis not present

## 2017-07-23 ENCOUNTER — Emergency Department (HOSPITAL_COMMUNITY)
Admission: EM | Admit: 2017-07-23 | Discharge: 2017-07-23 | Disposition: A | Discharge status: Home / Self Care | Payer: Medicare Other | Attending: Emergency Medicine | Admitting: Emergency Medicine

## 2017-07-23 ENCOUNTER — Encounter (HOSPITAL_COMMUNITY): Payer: Self-pay | Admitting: *Deleted

## 2017-07-23 DIAGNOSIS — Z7982 Long term (current) use of aspirin: Secondary | ICD-10-CM | POA: Insufficient documentation

## 2017-07-23 DIAGNOSIS — L03116 Cellulitis of left lower limb: Secondary | ICD-10-CM | POA: Diagnosis not present

## 2017-07-23 DIAGNOSIS — Z9049 Acquired absence of other specified parts of digestive tract: Secondary | ICD-10-CM | POA: Diagnosis not present

## 2017-07-23 DIAGNOSIS — Z79899 Other long term (current) drug therapy: Secondary | ICD-10-CM | POA: Insufficient documentation

## 2017-07-23 DIAGNOSIS — M79662 Pain in left lower leg: Secondary | ICD-10-CM | POA: Diagnosis present

## 2017-07-23 MED ORDER — SULFAMETHOXAZOLE-TRIMETHOPRIM 800-160 MG PO TABS
1.0000 | ORAL_TABLET | Freq: Once | ORAL | Status: AC
  Administered 2017-07-23: 1 via ORAL
  Filled 2017-07-23: qty 1

## 2017-07-23 MED ORDER — SULFAMETHOXAZOLE-TRIMETHOPRIM 800-160 MG PO TABS: 2.0000 | ORAL_TABLET | Freq: Two times a day (BID) | ORAL | 0 refills | Status: DC

## 2017-07-23 NOTE — ED Triage Notes (Signed)
Pt complains of sores on her right upper torso and right lower leg x 3 days. Pt states she tried to squeeze the wound on her llower eg, which made the wound worse and caused redness to spread.

## 2017-07-23 NOTE — ED Provider Notes (Signed)
[facility] DEPT Provider Note   CSN: [id number] Arrival date & time: 07/23/17  1204     History   Chief Complaint Chief Complaint  Patient presents with  . Wound Infection    HPI Morgan Huff is a 71 y.o. female. Chief complaint is red painful leg  HPI:  This is a 71 year old female. She knows to the small "pustule" on her left lower leg and she scratched it. States this chart of looking "like a big pimple". She has some stranding erythema now. Also has a very similar area next to the skin tag on her left lateral upper abdominal wall. No fevers. No chills. Does not feel systemic symptoms. No history of diabetes or immune suppression.  Past Medical History:  Diagnosis Date  . Colon polyps   . Diverticulosis of colon   . Fasting hyperglycemia   . Glaucoma    Dr Edilia Bo  . Hyperlipidemia    LDL goal = < 100; Lp(a) 112; hyperabsorber    Patient Active Problem List   Diagnosis Date Noted  . Cough 06/11/2016  . Polycythemia, secondary 04/12/2016  . Well adult exam 04/12/2016  . Osteoarthritis of right hip 04/12/2016  . Prolapse of uterus 06/05/2015  . Bilateral leg weakness 04/06/2013  . Edema 04/06/2013  . Glaucoma 03/17/2011  . DIVERTICULOSIS, COLON 10/02/2010  . VARICOSE VEINS, LOWER EXTREMITIES, MILD 07/13/2010  . ELEVATED BLOOD PRESSURE WITHOUT DIAGNOSIS OF HYPERTENSION 04/10/2010  . PLANTAR FASCIITIS, LEFT 06/18/2009  . OTHER ACQUIRED DEFORMITY OF ANKLE AND FOOT OTHER 06/18/2009  . History of colonic polyps 03/25/2009  . Hyperlipidemia 02/20/2008  . OTHER VOICE DISTURBANCE 02/20/2008  . Hyperglycemia 02/20/2008    Past Surgical History:  Procedure Laterality Date  . ANTERIOR AND POSTERIOR REPAIR WITH SACROSPINOUS FIXATION N/A 06/05/2015   Procedure: ANTERIOR AND POSTERIOR REPAIR WITH SACROSPINOUS LIGAMENT SUSPENSION;  Surgeon: Louretta Shorten, MD;  Location: [facility];  Service: Gynecology;  Laterality: N/A;  . CATARACT EXTRACTION,  BILATERAL     lens implant  . CHOLECYSTECTOMY    . CHOLECYSTECTOMY, LAPAROSCOPIC     Dr Margot Chimes  . COLONOSCOPY  2015   neg; due 2020  . colonoscopy with polypectomy     Dr Watt Climes; X 3  . EYE SURGERY     trabeculectomy ;Dr Kendall Flack.Cataract removal OD; Dr Scot Dock    OB History    No data available       Home Medications    Prior to Admission medications   Medication Sig Start Date End Date Taking? Authorizing Provider  Ascorbic Acid (VITAMIN C) 1000 MG tablet Take 1,000 mg by mouth daily.      [provider]  aspirin 81 MG tablet Take 81 mg by mouth daily.    [provider]  Biotin 5 MG TBDP Take by mouth.    [provider]  calcium carbonate (TUMS - DOSED IN MG ELEMENTAL CALCIUM) 500 MG chewable tablet Chew 1 tablet by mouth daily.    [provider]  cholecalciferol (VITAMIN D) 1000 units tablet Take 2 tablets (2,000 Units total) by mouth daily. 04/12/16   Plotnikov, Evie Lacks, MD  erythromycin ophthalmic ointment Place into the right eye nightly. 06/06/17   [provider]  ibuprofen (ADVIL,MOTRIN) 600 MG tablet Take 1 tablet (600 mg total) by mouth 2 (two) times daily as needed for moderate pain. 12/08/16   Plotnikov, Evie Lacks, MD  moxifloxacin (VIGAMOX) 0.5 % ophthalmic solution Place 1 drop into the right eye 3 times  daily. 06/06/17   [provider]  Multiple Vitamins-Minerals (CENTRUM SILVER PO) Take 1 tablet by mouth daily.      [provider]  Omega-3 Fatty Acids (Elida) 1000 MG CAPS     [provider]  Polyethyl Glycol-Propyl Glycol (SYSTANE OP) Apply to eye daily as needed.    [provider]  simvastatin (ZOCOR) 10 MG tablet Take 1 tablet (10 mg total) by mouth daily. 04/18/17   Plotnikov, Evie Lacks, MD  sulfamethoxazole-trimethoprim (BACTRIM DS,SEPTRA DS) 800-160 MG tablet Take 2 tablets by mouth 2 (two) times daily. 07/23/17   Tanna Furry, MD  timolol (BETIMOL) 0.5 % ophthalmic solution  Place 1 drop into the left eye daily.     [provider]  traMADol (ULTRAM) 50 MG tablet Take 1-2 tablets (50-100 mg total) by mouth every 8 (eight) hours as needed. 04/18/17   Plotnikov, Evie Lacks, MD    Family History Family History  Problem Relation Age of Onset  . Polycythemia Father        transition into leukemia  . Hypokalemia Father   . Leukemia Father   . Glaucoma Father   . Heart attack Father 26       in context hypokalemia  . Lung cancer Mother        smoker  . Heart attack Maternal Grandfather 73  . Breast cancer Maternal Grandmother   . Melanoma Brother   . Diabetes Neg Hx   . Stroke Neg Hx     Social History Social History   Tobacco Use  . Smoking status: Never Smoker  . Smokeless tobacco: Never Used  Substance Use Topics  . Alcohol use: Yes    Comment:  rarely  . Drug use: No     Allergies   Patient has no known allergies.   Review of Systems Review of Systems  Constitutional: Negative for appetite change, chills, diaphoresis, fatigue and fever.  HENT: Negative for mouth sores, sore throat and trouble swallowing.   Eyes: Negative for visual disturbance.  Respiratory: Negative for cough, chest tightness, shortness of breath and wheezing.   Cardiovascular: Negative for chest pain.  Gastrointestinal: Negative for abdominal distention, abdominal pain, diarrhea, nausea and vomiting.  Endocrine: Negative for polydipsia, polyphagia and polyuria.  Genitourinary: Negative for dysuria, frequency and hematuria.  Musculoskeletal: Negative for gait problem.  Skin: Positive for color change and rash. Negative for pallor.  Neurological: Negative for dizziness, syncope, light-headedness and headaches.  Hematological: Does not bruise/bleed easily.  Psychiatric/Behavioral: Negative for behavioral problems and confusion.     Physical Exam Updated Vital Signs BP (!) 157/92 (BP Location: Right Arm)   Pulse 72   Temp 98.5 F (36.9 C) (Oral)   Resp 18    SpO2 99%   Physical Exam  Constitutional: She is oriented to person, place, and time. She appears well-developed and well-nourished. No distress.  HENT:  Head: Normocephalic.  Eyes: Conjunctivae are normal. Pupils are equal, round, and reactive to light. No scleral icterus.  Neck: Normal range of motion. Neck supple. No thyromegaly present.  Cardiovascular: Normal rate and regular rhythm. Exam reveals no gallop and no friction rub.  No murmur heard. Pulmonary/Chest: Effort normal and breath sounds normal. No respiratory distress. She has no wheezes. She has no rales.  Abdominal: Soft. Bowel sounds are normal. She exhibits no distension. There is no tenderness. There is no rebound.  Musculoskeletal: Normal range of motion.  Neurological: She is alert and oriented to person, place, and time.  Skin: Skin is warm and dry. No rash noted.  Small area of cellulitis in the left lower leg adjacent small break in the skin appears consistent with a small blister ruptured pustule. Similar area on the left upper abdominal wall. No vesicles. Both appear consistent with probable small carbuncles with surrounding cellulitis  Psychiatric: She has a normal mood and affect. Her behavior is normal.     ED Treatments / Results  Labs (all labs ordered are listed, but only abnormal results are displayed) Labs Reviewed - No data to display  EKG  EKG Interpretation None       Radiology No results found.  Procedures Procedures (including critical care time)  Medications Ordered in ED Medications  sulfamethoxazole-trimethoprim (BACTRIM DS,SEPTRA DS) 800-160 MG per tablet 1 tablet (1 tablet Oral Given 07/23/17 1404)     Initial Impression / Assessment and Plan / ED Course  I have reviewed the triage vital signs and the nursing notes.  Pertinent labs & imaging results that were available during my care of the patient were reviewed by me and considered in my medical decision making (see chart for  details).    Spontaneous carbuncles with minimal cellulitis. Plan coverage for staph, strep, as well as MRSA. Bactrim DS 2 pills twice a day 10 days. Primary care follow-up.  Final Clinical Impressions(s) / ED Diagnoses   Final diagnoses:  Cellulitis of left lower extremity    ED Discharge Orders        Ordered    sulfamethoxazole-trimethoprim (BACTRIM DS,SEPTRA DS) 800-160 MG tablet  2 times daily     07/23/17 1428       Tanna Furry, MD 07/23/17 1513

## 2017-07-27 ENCOUNTER — Ambulatory Visit (INDEPENDENT_AMBULATORY_CARE_PROVIDER_SITE_OTHER): Payer: Medicare Other | Admitting: Internal Medicine

## 2017-07-27 ENCOUNTER — Encounter: Payer: Self-pay | Admitting: Internal Medicine

## 2017-07-27 DIAGNOSIS — L02416 Cutaneous abscess of left lower limb: Secondary | ICD-10-CM | POA: Diagnosis not present

## 2017-07-27 DIAGNOSIS — L03116 Cellulitis of left lower limb: Secondary | ICD-10-CM

## 2017-07-27 NOTE — Assessment & Plan Note (Signed)
12/19 L dist shin erythema; pustule has resolved L chest wall healing pustule w/a scab Bactrim DS

## 2017-07-27 NOTE — Progress Notes (Signed)
Subjective:  Patient ID: Morgan Huff, female    DOB: 07/14/1946  Age: 71 y.o. MRN: [id number]  CC: No chief complaint on file.   HPI Morgan Huff presents for a painful pustule on the LLE and L chest since last Wed. There is a spot on the L chest appeared earlier. The pt went to ER on 12/22. On Bactrim DS  Outpatient Medications Prior to Visit  Medication Sig Dispense Refill  . Ascorbic Acid (VITAMIN C) 1000 MG tablet Take 1,000 mg by mouth daily.      Marland Kitchen aspirin 81 MG tablet Take 81 mg by mouth daily.    . Biotin 5 MG TBDP Take by mouth.    . calcium carbonate (TUMS - DOSED IN MG ELEMENTAL CALCIUM) 500 MG chewable tablet Chew 2 tablets by mouth daily.     . cholecalciferol (VITAMIN D) 1000 units tablet Take 2 tablets (2,000 Units total) by mouth daily. 100 tablet 3  . erythromycin ophthalmic ointment Place into the right eye nightly.    Marland Kitchen ibuprofen (ADVIL,MOTRIN) 600 MG tablet Take 1 tablet (600 mg total) by mouth 2 (two) times daily as needed for moderate pain. 180 tablet 0  . Multiple Vitamins-Minerals (CENTRUM SILVER PO) Take 1 tablet by mouth daily.      . Omega-3 Fatty Acids (FISH OIL) 1000 MG CAPS     . Polyethyl Glycol-Propyl Glycol (SYSTANE OP) Apply to eye daily as needed.    . simvastatin (ZOCOR) 10 MG tablet Take 1 tablet (10 mg total) by mouth daily. 90 tablet 3  . sulfamethoxazole-trimethoprim (BACTRIM DS,SEPTRA DS) 800-160 MG tablet Take 2 tablets by mouth 2 (two) times daily. 28 tablet 0  . timolol (BETIMOL) 0.5 % ophthalmic solution Place 1 drop into the left eye daily.     . traMADol (ULTRAM) 50 MG tablet Take 1-2 tablets (50-100 mg total) by mouth every 8 (eight) hours as needed. 60 tablet 2  . Biotin 5 MG TBDP Take by mouth.    . moxifloxacin (VIGAMOX) 0.5 % ophthalmic solution Place 1 drop into the right eye 3 times daily.     No facility-administered medications prior to visit.     ROS Review of Systems  Constitutional: Negative for activity change,  appetite change, chills, fatigue and unexpected weight change.  HENT: Negative for congestion, mouth sores and sinus pressure.   Eyes: Negative for visual disturbance.  Respiratory: Negative for cough and chest tightness.   Gastrointestinal: Negative for abdominal pain and nausea.  Genitourinary: Negative for difficulty urinating, frequency and vaginal pain.  Musculoskeletal: Positive for arthralgias. Negative for back pain and gait problem.  Skin: Positive for color change and rash. Negative for pallor.  Neurological: Negative for dizziness, tremors, weakness, numbness and headaches.  Psychiatric/Behavioral: Negative for confusion and sleep disturbance.    Objective:  BP 124/75 (BP Location: Left Arm, Patient Position: Sitting, Cuff Size: Large)   Pulse 74   Temp 98.3 F (36.8 C) (Oral)   Ht 6\' 2"  (1.88 m)   Wt 267 lb (121.1 kg)   SpO2 96%   BMI 34.28 kg/m   BP Readings from Last 3 Encounters:  07/27/17 124/75  07/23/17 (!) 157/92  06/10/17 126/82    Wt Readings from Last 3 Encounters:  07/27/17 267 lb (121.1 kg)  06/10/17 266 lb (120.7 kg)  04/13/17 262 lb (118.8 kg)    Physical Exam  Constitutional: She appears well-developed. No distress.  HENT:  Head: Normocephalic.  Right Ear: External ear  normal.  Left Ear: External ear normal.  Nose: Nose normal.  Mouth/Throat: Oropharynx is clear and moist.  Eyes: Conjunctivae are normal. Pupils are equal, round, and reactive to light. Right eye exhibits no discharge. Left eye exhibits no discharge.  Neck: Normal range of motion. Neck supple. No JVD present. No tracheal deviation present. No thyromegaly present.  Cardiovascular: Normal rate, regular rhythm and normal heart sounds.  Pulmonary/Chest: No stridor. No respiratory distress. She has no wheezes.  Abdominal: Soft. Bowel sounds are normal. She exhibits no distension and no mass. There is no tenderness. There is no rebound and no guarding.  Musculoskeletal: She exhibits  edema and tenderness.  Lymphadenopathy:    She has no cervical adenopathy.  Neurological: She displays normal reflexes. No cranial nerve deficit. She exhibits normal muscle tone. Coordination normal.  Skin: No rash noted. There is erythema.  Psychiatric: She has a normal mood and affect. Her behavior is normal. Judgment and thought content normal.   L dist shin erythema; pustule has resolved L chest wall healing pustule w/a scab  Lab Results  Component Value Date   WBC 9.4 06/10/2017   HGB 15.3 (H) 06/10/2017   HCT 46.8 (H) 06/10/2017   PLT 303.0 06/10/2017   GLUCOSE 112 (H) 06/10/2017   CHOL 177 06/10/2017   TRIG 151.0 (H) 06/10/2017   HDL 52.90 06/10/2017   LDLDIRECT 85.6 02/20/2008   LDLCALC 94 06/10/2017   ALT 16 06/10/2017   AST 18 06/10/2017   NA 139 06/10/2017   K 4.4 06/10/2017   CL 102 06/10/2017   CREATININE 0.82 06/10/2017   BUN 28 (H) 06/10/2017   CO2 30 06/10/2017   TSH 2.52 04/12/2016   HGBA1C 6.0 06/10/2017   MICROALBUR 0.7 04/12/2016    No results found.  Assessment & Plan:   There are no diagnoses linked to this encounter. I have discontinued Morgan Huff "Eshani Barbe"'s moxifloxacin. I am also having her maintain her Multiple Vitamins-Minerals (CENTRUM SILVER PO), vitamin C, timolol, aspirin, Polyethyl Glycol-Propyl Glycol (SYSTANE OP), cholecalciferol, calcium carbonate, ibuprofen, simvastatin, traMADol, Fish Oil, erythromycin, sulfamethoxazole-trimethoprim, and Biotin.  No orders of the defined types were placed in this encounter.    Follow-up: No Follow-up on file.  Walker Kehr, MD

## 2017-07-27 NOTE — Patient Instructions (Signed)
Finish the antibiotic

## 2017-08-18 DIAGNOSIS — Z48810 Encounter for surgical aftercare following surgery on the sense organs: Secondary | ICD-10-CM | POA: Diagnosis not present

## 2017-08-18 DIAGNOSIS — H401331 Pigmentary glaucoma, bilateral, mild stage: Secondary | ICD-10-CM | POA: Diagnosis not present

## 2017-08-18 DIAGNOSIS — H44421 Hypotony of right eye due to ocular fistula: Secondary | ICD-10-CM | POA: Diagnosis not present

## 2017-08-18 DIAGNOSIS — Z79899 Other long term (current) drug therapy: Secondary | ICD-10-CM | POA: Diagnosis not present

## 2017-09-28 DIAGNOSIS — D485 Neoplasm of uncertain behavior of skin: Secondary | ICD-10-CM | POA: Diagnosis not present

## 2017-09-28 DIAGNOSIS — L820 Inflamed seborrheic keratosis: Secondary | ICD-10-CM | POA: Diagnosis not present

## 2017-09-28 DIAGNOSIS — L821 Other seborrheic keratosis: Secondary | ICD-10-CM | POA: Diagnosis not present

## 2017-10-04 DIAGNOSIS — M545 Low back pain: Secondary | ICD-10-CM | POA: Diagnosis not present

## 2017-10-04 DIAGNOSIS — M25551 Pain in right hip: Secondary | ICD-10-CM | POA: Diagnosis not present

## 2017-10-12 ENCOUNTER — Other Ambulatory Visit: Payer: Self-pay

## 2017-10-12 MED ORDER — SULFAMETHOXAZOLE-TRIMETHOPRIM 800-160 MG PO TABS: 2.0000 | ORAL_TABLET | Freq: Two times a day (BID) | ORAL | 0 refills | Status: DC

## 2017-10-17 ENCOUNTER — Ambulatory Visit: Payer: Self-pay | Admitting: *Deleted

## 2017-10-17 ENCOUNTER — Telehealth: Payer: Self-pay

## 2017-10-17 ENCOUNTER — Ambulatory Visit (INDEPENDENT_AMBULATORY_CARE_PROVIDER_SITE_OTHER): Payer: Medicare Other | Admitting: Family

## 2017-10-17 ENCOUNTER — Encounter: Payer: Self-pay | Admitting: Family

## 2017-10-17 VITALS — BP 130/78 | HR 67 | Temp 98.6°F | Ht 74.0 in | Wt 275.0 lb

## 2017-10-17 DIAGNOSIS — N611 Abscess of the breast and nipple: Secondary | ICD-10-CM | POA: Diagnosis not present

## 2017-10-17 MED ORDER — DOXYCYCLINE HYCLATE 100 MG PO TABS: 100.0000 mg | ORAL_TABLET | Freq: Two times a day (BID) | ORAL | 0 refills | Status: DC

## 2017-10-17 NOTE — Telephone Encounter (Signed)
Called in c/o having "cellulitis on the right outer side of her right breast that started about Tuesday of last week".    She sent a message via Mychart and was started on an antibiotic.   She had cellulitis on her leg in December that required a course of antibiotics.   This looks the same as that.   She was instructed to call back if she was not improving on the antibiotic.   She is not improving.   See triage notes below.  I made an appt with [name], FNP for today at 2:20 because her PCP Dr. Alain Marion was booked today.   Reason for Disposition . [1] Looks infected (warmth, redness, painful to touch) AND [2] no fever  Answer Assessment - Initial Assessment Questions 1. SYMPTOM: "What's the main symptom you're concerned about?"  (e.g., lump, pain, rash, nipple discharge)     Right side of my right breast.    It started as a pimple and is now 2 inches in diameter.   It started last week of Tuesday or Wednesday.   I've had cellulitis before in my leg and this looks like the same thing.    I notified Dr. Alain Marion via [facility] and he started me on an antibiotic that I've been taking since Wednesday.    It's not improving. 2. LOCATION: "Where is the _______ located?"     See above 3. ONSET: "When did ________  start?"     See above 4. PRIOR HISTORY: "Do you have any history of prior problems with your breasts?" (e.g., lumps, cancer, fibrocystic breast disease)     Yes.   I've had cellulitis in Dec. 2017 on front of my left leg.   This looks the same. 5. CAUSE: "What do you think is causing this symptom?"     Cellulitis like I had on my leg. 6. OTHER SYMPTOMS: "Do you have any other symptoms?" (e.g., fever, breast pain, redness or rash, nipple discharge)     No fever, I checked it.   It's red all around the cellulitis.   This morning it's been bleeding on the band aid that's new.   It's painful to touch. 7. PREGNANCY-BREASTFEEDING: "Is there any chance you are pregnant?" "When was your last  menstrual period?" "Are you breastfeeding?"     Not asked  Protocols used: BREAST [facility]

## 2017-10-17 NOTE — Telephone Encounter (Signed)
-----   Message -----  From: Morgan Huff  Sent: 10/14/2017  2:36 PM  To: [facility] Admin Pool  Subject: RE: Appointment Request               ----- Message from Fidelis, Generic sent at 10/14/2017 2:36 PM EDT -----    Re: cellulitis issue - the prescription I received is sulfamethoxazole-tmp ds. I began it Wednesday night. The prescription that worked before is sulfamethoxazole-trimethoprim (Bactrum DS, Septra DS. Is this the same thing? I am not seeing any improvement. The center of the site is a very deep red. Next step? Is it ok to wait til Monday, if Doc needs to see me? If I need an appointment, please let me know when to come on Monday. Thanks.  ----- Message -----  From: CMA Raynald Kemp  Sent: 10/12/2017 11:06 AM EDT  To: Morgan Huff  Subject: RE: Appointment Request  Evelena Leyden,    I can juts send this prescription to your pharmacy for you, but if it gets worse or does not go away you will need an appointment.    Sincerely,  Karren Cobble, CMA    ----- Message -----   From: Morgan Huff   Sent: 10/11/2017 5:22 PM EDT    To: Patient Appointment Schedule Request Mailing List  Subject: Appointment Request    Appointment Request From: Morgan Huff    With Provider: Walker Kehr, MD [facility]]    Preferred Date Range: 10/13/2017 - 10/20/2017    Preferred Times: Monday Afternoon, Tuesday Afternoon, Wednesday Afternoon, Thursday Afternoon    Reason for visit: Office Visit    Comments:  I had cellulitis diagnosed in Dec. It appears to have returned on my breast. The Bactrim DS/Septra DS prescription I had is used up. It seemed to work well. Can you refill it, or do I need to come in?  Spot is increasing in redness and swelling. Thanks.

## 2017-10-17 NOTE — Patient Instructions (Signed)
The breast surgeon will be contacting you to get you seen tomorrow; please be on the look out for a call from [facility];

## 2017-10-17 NOTE — Telephone Encounter (Signed)
Sending as an Pharmacist, hospital. She is on our schedule.

## 2017-10-17 NOTE — Progress Notes (Signed)
KAMARRI FISCHETTI is a 72 y.o. female with the following history as recorded in EpicCare:  Patient Active Problem List   Diagnosis Date Noted  . Cellulitis and abscess of left leg 07/27/2017  . Cough 06/11/2016  . Polycythemia, secondary 04/12/2016  . Well adult exam 04/12/2016  . Osteoarthritis of right hip 04/12/2016  . Prolapse of uterus 06/05/2015  . Bilateral leg weakness 04/06/2013  . Edema 04/06/2013  . Glaucoma 03/17/2011  . DIVERTICULOSIS, COLON 10/02/2010  . VARICOSE VEINS, LOWER EXTREMITIES, MILD 07/13/2010  . ELEVATED BLOOD PRESSURE WITHOUT DIAGNOSIS OF HYPERTENSION 04/10/2010  . PLANTAR FASCIITIS, LEFT 06/18/2009  . OTHER ACQUIRED DEFORMITY OF ANKLE AND FOOT OTHER 06/18/2009  . History of colonic polyps 03/25/2009  . Hyperlipidemia 02/20/2008  . OTHER VOICE DISTURBANCE 02/20/2008  . Hyperglycemia 02/20/2008    Current Outpatient Medications  Medication Sig Dispense Refill  . Ascorbic Acid (VITAMIN C) 1000 MG tablet Take 1,000 mg by mouth daily.      Marland Kitchen aspirin 81 MG tablet Take 81 mg by mouth daily.    . Biotin 5 MG TBDP Take by mouth.    . calcium carbonate (TUMS - DOSED IN MG ELEMENTAL CALCIUM) 500 MG chewable tablet Chew 2 tablets by mouth daily.     . cholecalciferol (VITAMIN D) 1000 units tablet Take 2 tablets (2,000 Units total) by mouth daily. 100 tablet 3  . ibuprofen (ADVIL,MOTRIN) 600 MG tablet Take 1 tablet (600 mg total) by mouth 2 (two) times daily as needed for moderate pain. 180 tablet 0  . Multiple Vitamins-Minerals (CENTRUM SILVER PO) Take 1 tablet by mouth daily.      . Omega-3 Fatty Acids (FISH OIL) 1000 MG CAPS     . Polyethyl Glycol-Propyl Glycol (SYSTANE OP) Apply to eye daily as needed.    Marland Kitchen SHINGRIX injection     . simvastatin (ZOCOR) 10 MG tablet Take 1 tablet (10 mg total) by mouth daily. 90 tablet 3  . timolol (BETIMOL) 0.5 % ophthalmic solution Place 1 drop into the left eye daily.     . traMADol (ULTRAM) 50 MG tablet Take 1-2 tablets  (50-100 mg total) by mouth every 8 (eight) hours as needed. 60 tablet 2  . doxycycline (VIBRA-TABS) 100 MG tablet Take 1 tablet (100 mg total) by mouth 2 (two) times daily. 20 tablet 0  . silver sulfADIAZINE (SILVADENE) 1 % cream Use qd-bid w/dressing change 25 g 0   No current facility-administered medications for this visit.     Allergies: Patient has no known allergies.  Past Medical History:  Diagnosis Date  . Colon polyps   . Diverticulosis of colon   . Fasting hyperglycemia   . Glaucoma    Dr Edilia Bo  . Hyperlipidemia    LDL goal = < 100; Lp(a) 112; hyperabsorber    Past Surgical History:  Procedure Laterality Date  . ANTERIOR AND POSTERIOR REPAIR WITH SACROSPINOUS FIXATION N/A 06/05/2015   Procedure: ANTERIOR AND POSTERIOR REPAIR WITH SACROSPINOUS LIGAMENT SUSPENSION;  Surgeon: Louretta Shorten, MD;  Location: [facility];  Service: Gynecology;  Laterality: N/A;  . CATARACT EXTRACTION, BILATERAL     lens implant  . CHOLECYSTECTOMY    . CHOLECYSTECTOMY, LAPAROSCOPIC     Dr Margot Chimes  . COLONOSCOPY  2015   neg; due 2020  . colonoscopy with polypectomy     Dr Watt Climes; X 3  . EYE SURGERY     trabeculectomy ;Dr Kendall Flack.Cataract removal OD; Dr Scot Dock    Family History  Problem Relation Age  of Onset  . Polycythemia Father        transition into leukemia  . Hypokalemia Father   . Leukemia Father   . Glaucoma Father   . Heart attack Father 3       in context hypokalemia  . Lung cancer Mother        smoker  . Heart attack Maternal Grandfather 73  . Breast cancer Maternal Grandmother   . Melanoma Brother   . Diabetes Neg Hx   . Stroke Neg Hx     Social History   Tobacco Use  . Smoking status: Never Smoker  . Smokeless tobacco: Never Used  Substance Use Topics  . Alcohol use: Yes    Comment:  rarely    Subjective:  Patient called our office last week with concerns about cellulitis on her right breast; she was started on Bactrim DS and has now been on this medication for the past 5  days; she notes the area of concern has continued to worsen- the redness is spreading; area did begin to drain earlier today and notes that has offered some relief with the pain; denies any fever; notes she is up to date on yearly mammograms- last one in March 2018 and next one scheduled for April 2019;   Objective:  Vitals:   10/17/17 1437  BP: 130/78  Pulse: 67  Temp: 98.6 F (37 C)  TempSrc: Oral  SpO2: 98%  Weight: 275 lb 0.6 oz (124.8 kg)  Height: 6\' 2"  (1.88 m)    General: Well developed, well nourished, in no acute distress  Skin : Warm and dry. Breast abscess noted over lateral right breast with pustular drainage noted;  Head: Normocephalic and atraumatic  Lungs: Respirations unlabored; clear to auscultation bilaterally without wheeze, rales, rhonchi  CVS exam: normal rate and regular rhythm.  Neurologic: Alert and oriented; speech intact; face symmetrical; moves all extremities well; CNII-XII intact without focal deficit  Assessment:  1. Breast abscess     Plan:  Patient is not responding to Bactrim DS; am concerned about appearance of area and feel surgical consult needed; explained to patient that I do not feel comfortable attempting I&D on her breast; D/C Bactrim DS- change to Doxycycline 100 mg bid x 10 days; STAT referral to general surgery- hope to have patient seen on 3/19; follow-up to be determined.  No Follow-up on file.  Orders Placed This Encounter  Procedures  . Ambulatory referral to General Surgery    Referral Priority:   Urgent    Referral Type:   Surgical    Referral Reason:   Specialty Services Required    Requested Specialty:   General Surgery    Number of Visits Requested:   1    Requested Prescriptions   Signed Prescriptions Disp Refills  . doxycycline (VIBRA-TABS) 100 MG tablet 20 tablet 0    Sig: Take 1 tablet (100 mg total) by mouth 2 (two) times daily.

## 2017-10-18 ENCOUNTER — Telehealth: Payer: Self-pay

## 2017-10-18 ENCOUNTER — Ambulatory Visit (INDEPENDENT_AMBULATORY_CARE_PROVIDER_SITE_OTHER): Payer: Medicare Other | Admitting: Internal Medicine

## 2017-10-18 ENCOUNTER — Encounter: Payer: Self-pay | Admitting: Internal Medicine

## 2017-10-18 VITALS — BP 124/72 | HR 72 | Temp 97.7°F | Ht 74.0 in | Wt 275.0 lb

## 2017-10-18 DIAGNOSIS — N611 Abscess of the breast and nipple: Secondary | ICD-10-CM

## 2017-10-18 MED ORDER — SILVER SULFADIAZINE 1 % EX CREA
TOPICAL_CREAM | CUTANEOUS | 0 refills | Status: DC
Start: 2017-10-18 — End: 2017-12-09

## 2017-10-18 NOTE — Telephone Encounter (Signed)
Please advise. Thanks.  

## 2017-10-18 NOTE — Progress Notes (Deleted)
Procedure note:  Incision and Drainage of an Abscess   Indication : a localized collection of pus that is tender and not spontaneously resolving.    Risks including unsuccessful procedure , possible need for a repeat procedure due to pus accumulation, scar formation, and others as well as benefits were explained to the patient in detail. Written consent was obtained/signed.    The patient was placed in a decubitus position. The area of an abscess was prepped with povidone-iodine and draped in a sterile fashion. Local anesthesia with   2    cc of 2% lidocaine and epinephrine  was administered.  1 cm incision with #11strait blade was made. About 3 cc of purulent material was expressed. The abscess cavity was explored with a sterile hemostat and the walled- off pockets and septae were broken down bluntly. The cavity was irrigated with the rest of the anesthetic in the syringe and packed with 3 inches of  the iodoform gauze.   The wound was dressed with antibiotic ointment and Telfa pad.  Tolerated well. Complications: None.   Wound instructions provided.   Wound instructions : change dressing once a day or twice a day is needed. Change dressing after  shower in the morning.  Pat dry the wound with gauze. Pull out one inch of packing everyday and cut it off. Re-dress wound with antibiotic ointment and Telfa pad or a Band-Aid of appropriate size.   Please contact us if you notice a recollection of pus in the abscess fever and chills increased pain redness red streaks near the abscess increased swelling in the area.

## 2017-10-18 NOTE — Telephone Encounter (Signed)
Copied from [place]. Topic: Referral - Question >> Oct 18, 2017 10:35 AM Morgan Huff wrote: Reason for CRM: Patient is scheduled to see general surgeon on 10/20/17, patient thought [name] wanted her to be seen today, please advise

## 2017-10-18 NOTE — Telephone Encounter (Signed)
Spoke with patient today and offered her apt at 2:30 pm to see Dr. Alain Marion today. She will try to make apt at 2:30 pm. I advised her to call office back if she was unable to make apt offered to her today.

## 2017-10-18 NOTE — Telephone Encounter (Signed)
Patient has been scheduled today with her PCP, Dr. Alain Marion for evaluation since she cannot be seen at surgical center until Thursday. She agrees to appt today at 2:30.

## 2017-10-18 NOTE — Patient Instructions (Signed)
Wound instructions provided.   Wound instructions : change dressing once a day or twice a day is needed. Change dressing after  shower in the morning.  Pat dry the wound with gauze. Pull out one inch of packing everyday and cut it off. Re-dress wound with antibiotic ointment and Telfa pad or a Band-Aid of appropriate size.   Please contact us if you notice a recollection of pus in the abscess fever and chills increased pain redness red streaks near the abscess increased swelling in the area.

## 2017-10-19 ENCOUNTER — Encounter: Payer: Self-pay | Admitting: Internal Medicine

## 2017-10-19 DIAGNOSIS — N611 Abscess of the breast and nipple: Secondary | ICD-10-CM | POA: Insufficient documentation

## 2017-10-19 NOTE — Progress Notes (Signed)
   Subjective:    Patient ID: Morgan Huff, female    DOB: 30-May-1946, 72 y.o.   MRN: [id number]  HPI  C/o R breast boil - worse and started to ooze pus  Review of Systems  Skin: Positive for color change, rash and wound.       Objective:   Physical Exam  Constitutional: She appears well-developed. No distress.  HENT:  Head: Normocephalic.  Right Ear: External ear normal.  Left Ear: External ear normal.  Nose: Nose normal.  Mouth/Throat: Oropharynx is clear and moist.  Eyes: Conjunctivae are normal. Pupils are equal, round, and reactive to light. Right eye exhibits no discharge. Left eye exhibits no discharge.  Neck: Normal range of motion. Neck supple. No JVD present. No tracheal deviation present. No thyromegaly present.  Cardiovascular: Normal rate, regular rhythm and normal heart sounds.  Pulmonary/Chest: No stridor. No respiratory distress. She has no wheezes.  Abdominal: Soft. Bowel sounds are normal. She exhibits no distension and no mass. There is no tenderness. There is no rebound and no guarding.  Musculoskeletal: She exhibits no edema or tenderness.  Lymphadenopathy:    She has no cervical adenopathy.  Neurological: She displays normal reflexes. No cranial nerve deficit. She exhibits normal muscle tone. Coordination normal.  Skin: No rash noted. There is erythema.  Psychiatric: She has a normal mood and affect. Her behavior is normal. Judgment and thought content normal.  2x3 cm eryth abscess on the R lateral breast w/purulent d/c present   Procedure note:  Incision and Drainage of an Abscess   Indication : a localized collection of pus that is tender and not spontaneously resolving.    Risks including unsuccessful procedure , possible need for a repeat procedure due to pus accumulation, scar formation, and others as well as benefits were explained to the patient in detail. Written consent was obtained/signed.    The patient was placed in a decubitus  position. The area of an abscess was prepped with povidone-iodine and draped in a sterile fashion. Local anesthesia with   2    cc of 2% lidocaine and epinephrine  was administered.  1 cm incision with #11strait blade was made. About 2-3 cc of purulent material was expressed. The abscess cavity was explored with a sterile hemostat and the walled- off pockets and septae were broken down bluntly. The cavity was irrigated with the rest of the anesthetic in the syringe and packed with 3 inches of  the iodoform gauze.   The wound was dressed with antibiotic ointment and Telfa pad.  Tolerated well. Complications: None.   Wound instructions provided.      Assessment & Plan:

## 2017-10-19 NOTE — Assessment & Plan Note (Signed)
See procedure On Doxy Silvadine Rx Keep OV w/Gen surgery on 3/21

## 2017-10-20 DIAGNOSIS — N611 Abscess of the breast and nipple: Secondary | ICD-10-CM | POA: Diagnosis not present

## 2017-10-24 DIAGNOSIS — N611 Abscess of the breast and nipple: Secondary | ICD-10-CM | POA: Diagnosis not present

## 2017-10-27 DIAGNOSIS — H401331 Pigmentary glaucoma, bilateral, mild stage: Secondary | ICD-10-CM | POA: Diagnosis not present

## 2017-11-03 DIAGNOSIS — M5442 Lumbago with sciatica, left side: Secondary | ICD-10-CM | POA: Diagnosis not present

## 2017-11-03 DIAGNOSIS — M5441 Lumbago with sciatica, right side: Secondary | ICD-10-CM | POA: Diagnosis not present

## 2017-11-03 DIAGNOSIS — M25551 Pain in right hip: Secondary | ICD-10-CM | POA: Diagnosis not present

## 2017-11-03 DIAGNOSIS — M545 Low back pain: Secondary | ICD-10-CM | POA: Diagnosis not present

## 2017-11-04 DIAGNOSIS — N611 Abscess of the breast and nipple: Secondary | ICD-10-CM | POA: Diagnosis not present

## 2017-11-21 DIAGNOSIS — Z803 Family history of malignant neoplasm of breast: Secondary | ICD-10-CM | POA: Diagnosis not present

## 2017-11-21 DIAGNOSIS — Z1231 Encounter for screening mammogram for malignant neoplasm of breast: Secondary | ICD-10-CM | POA: Diagnosis not present

## 2017-11-21 LAB — HM MAMMOGRAPHY

## 2017-11-29 ENCOUNTER — Encounter: Payer: Self-pay | Admitting: Internal Medicine

## 2017-11-29 NOTE — Progress Notes (Signed)
Outside notes received. Information abstracted. Notes sent to scan.  

## 2017-12-09 ENCOUNTER — Other Ambulatory Visit (INDEPENDENT_AMBULATORY_CARE_PROVIDER_SITE_OTHER): Payer: Medicare Other

## 2017-12-09 ENCOUNTER — Ambulatory Visit (INDEPENDENT_AMBULATORY_CARE_PROVIDER_SITE_OTHER): Payer: Medicare Other | Admitting: Internal Medicine

## 2017-12-09 ENCOUNTER — Encounter: Payer: Self-pay | Admitting: Internal Medicine

## 2017-12-09 DIAGNOSIS — M1611 Unilateral primary osteoarthritis, right hip: Secondary | ICD-10-CM

## 2017-12-09 DIAGNOSIS — D751 Secondary polycythemia: Secondary | ICD-10-CM

## 2017-12-09 DIAGNOSIS — R739 Hyperglycemia, unspecified: Secondary | ICD-10-CM | POA: Diagnosis not present

## 2017-12-09 DIAGNOSIS — E782 Mixed hyperlipidemia: Secondary | ICD-10-CM

## 2017-12-09 LAB — CBC WITH DIFFERENTIAL/PLATELET
BASOS ABS: 0.1 10*3/uL (ref 0.0–0.1)
Basophils Relative: 1 % (ref 0.0–3.0)
EOS ABS: 0.2 10*3/uL (ref 0.0–0.7)
Eosinophils Relative: 2 % (ref 0.0–5.0)
HEMATOCRIT: 45.2 % (ref 36.0–46.0)
HEMOGLOBIN: 15 g/dL (ref 12.0–15.0)
LYMPHS PCT: 24.7 % (ref 12.0–46.0)
Lymphs Abs: 2.3 10*3/uL (ref 0.7–4.0)
MCHC: 33.1 g/dL (ref 30.0–36.0)
MCV: 90.7 fl (ref 78.0–100.0)
MONOS PCT: 10.8 % (ref 3.0–12.0)
Monocytes Absolute: 1 10*3/uL (ref 0.1–1.0)
NEUTROS PCT: 61.5 % (ref 43.0–77.0)
Neutro Abs: 5.8 10*3/uL (ref 1.4–7.7)
PLATELETS: 311 10*3/uL (ref 150.0–400.0)
RBC: 4.98 Mil/uL (ref 3.87–5.11)
RDW: 13.7 % (ref 11.5–15.5)
WBC: 9.5 10*3/uL (ref 4.0–10.5)

## 2017-12-09 LAB — BASIC METABOLIC PANEL
BUN: 23 mg/dL (ref 6–23)
CALCIUM: 10.4 mg/dL (ref 8.4–10.5)
CO2: 31 mEq/L (ref 19–32)
CREATININE: 0.91 mg/dL (ref 0.40–1.20)
Chloride: 104 mEq/L (ref 96–112)
GFR: 64.56 mL/min (ref >60.00)
Glucose, Bld: 101 mg/dL — ABNORMAL HIGH (ref 70–99)
Potassium: 4.5 mEq/L (ref 3.5–5.1)
Sodium: 140 mEq/L (ref 135–145)

## 2017-12-09 MED ORDER — TRAMADOL HCL 50 MG PO TABS: 50.0000 mg | ORAL_TABLET | Freq: Three times a day (TID) | ORAL | 3 refills | Status: DC | PRN

## 2017-12-09 MED ORDER — TRAMADOL HCL 50 MG PO TABS: 50.0000 mg | ORAL_TABLET | Freq: Three times a day (TID) | ORAL | 0 refills | Status: DC | PRN

## 2017-12-09 NOTE — Assessment & Plan Note (Signed)
CBC

## 2017-12-09 NOTE — Assessment & Plan Note (Signed)
BMET Diet discussed

## 2017-12-09 NOTE — Assessment & Plan Note (Signed)
Tramadol and ibuprofen prn F/u Dr Berenice Primas

## 2017-12-09 NOTE — Assessment & Plan Note (Signed)
Simvastatin 

## 2017-12-09 NOTE — Progress Notes (Signed)
Subjective:  Patient ID: Morgan Huff, female    DOB: 1945-09-29  Age: 72 y.o. MRN: [id number]  CC: No chief complaint on file.   HPI TRULA FREDE presents for LBP, hip pain  Chronic (Dr Berenice Primas q 6 mo). F/u abscesses - no relapse  Outpatient Medications Prior to Visit  Medication Sig Dispense Refill  . Ascorbic Acid (VITAMIN C) 1000 MG tablet Take 1,000 mg by mouth daily.      . Biotin 5 MG TBDP Take by mouth.    . calcium carbonate (TUMS - DOSED IN MG ELEMENTAL CALCIUM) 500 MG chewable tablet Chew 2 tablets by mouth daily.     . cholecalciferol (VITAMIN D) 1000 units tablet Take 2 tablets (2,000 Units total) by mouth daily. 100 tablet 3  . ibuprofen (ADVIL,MOTRIN) 600 MG tablet Take 1 tablet (600 mg total) by mouth 2 (two) times daily as needed for moderate pain. (Patient taking differently: Take 200 mg by mouth 2 (two) times daily as needed for moderate pain. ) 180 tablet 0  . Multiple Vitamins-Minerals (CENTRUM SILVER PO) Take 1 tablet by mouth daily.      . Omega-3 Fatty Acids (FISH OIL) 1000 MG CAPS     . Polyethyl Glycol-Propyl Glycol (SYSTANE OP) Apply to eye daily as needed.    . simvastatin (ZOCOR) 10 MG tablet Take 1 tablet (10 mg total) by mouth daily. 90 tablet 3  . timolol (BETIMOL) 0.5 % ophthalmic solution Place 1 drop into the left eye daily.     . traMADol (ULTRAM) 50 MG tablet Take 1-2 tablets (50-100 mg total) by mouth every 8 (eight) hours as needed. 60 tablet 2  . aspirin 81 MG tablet Take 81 mg by mouth daily.    Marland Kitchen doxycycline (VIBRA-TABS) 100 MG tablet Take 1 tablet (100 mg total) by mouth 2 (two) times daily. 20 tablet 0  . SHINGRIX injection     . silver sulfADIAZINE (SILVADENE) 1 % cream Use qd-bid w/dressing change 25 g 0   No facility-administered medications prior to visit.     ROS Review of Systems  Constitutional: Negative for activity change, appetite change, chills, fatigue and unexpected weight change.  HENT: Negative for congestion,  mouth sores and sinus pressure.   Eyes: Negative for visual disturbance.  Respiratory: Negative for cough and chest tightness.   Gastrointestinal: Negative for abdominal pain and nausea.  Genitourinary: Negative for difficulty urinating, frequency and vaginal pain.  Musculoskeletal: Positive for arthralgias, back pain and gait problem.  Skin: Negative for pallor and rash.  Neurological: Negative for dizziness, tremors, weakness, numbness and headaches.  Psychiatric/Behavioral: Negative for confusion, sleep disturbance and suicidal ideas.    Objective:  BP 126/82 (BP Location: Left Arm, Patient Position: Sitting, Cuff Size: Large)   Pulse 80   Temp 98.1 F (36.7 C) (Oral)   Ht 6\' 2"  (1.88 m)   Wt 277 lb (125.6 kg)   SpO2 98%   BMI 35.56 kg/m   BP Readings from Last 3 Encounters:  12/09/17 126/82  10/18/17 124/72  10/17/17 130/78    Wt Readings from Last 3 Encounters:  12/09/17 277 lb (125.6 kg)  10/18/17 275 lb (124.7 kg)  10/17/17 275 lb 0.6 oz (124.8 kg)    Physical Exam  Constitutional: She appears well-developed. No distress.  HENT:  Head: Normocephalic.  Right Ear: External ear normal.  Left Ear: External ear normal.  Nose: Nose normal.  Mouth/Throat: Oropharynx is clear and moist.  Eyes: Pupils are equal,  round, and reactive to light. Conjunctivae are normal. Right eye exhibits no discharge. Left eye exhibits no discharge.  Neck: Normal range of motion. Neck supple. No JVD present. No tracheal deviation present. No thyromegaly present.  Cardiovascular: Normal rate, regular rhythm and normal heart sounds.  Pulmonary/Chest: No stridor. No respiratory distress. She has no wheezes.  Abdominal: Soft. Bowel sounds are normal. She exhibits no distension and no mass. There is no tenderness. There is no rebound and no guarding.  Musculoskeletal: She exhibits no edema or tenderness.  Lymphadenopathy:    She has no cervical adenopathy.  Neurological: She displays normal  reflexes. No cranial nerve deficit. She exhibits normal muscle tone. Coordination normal.  Skin: No rash noted. No erythema.  Psychiatric: She has a normal mood and affect. Her behavior is normal. Judgment and thought content normal.    Lab Results  Component Value Date   WBC 9.4 06/10/2017   HGB 15.3 (H) 06/10/2017   HCT 46.8 (H) 06/10/2017   PLT 303.0 06/10/2017   GLUCOSE 112 (H) 06/10/2017   CHOL 177 06/10/2017   TRIG 151.0 (H) 06/10/2017   HDL 52.90 06/10/2017   LDLDIRECT 85.6 02/20/2008   LDLCALC 94 06/10/2017   ALT 16 06/10/2017   AST 18 06/10/2017   NA 139 06/10/2017   K 4.4 06/10/2017   CL 102 06/10/2017   CREATININE 0.82 06/10/2017   BUN 28 (H) 06/10/2017   CO2 30 06/10/2017   TSH 2.52 04/12/2016   HGBA1C 6.0 06/10/2017   MICROALBUR 0.7 04/12/2016    No results found.  Assessment & Plan:   There are no diagnoses linked to this encounter. I have discontinued Morgan Huff "Faustina Herrman"'s aspirin, SHINGRIX, doxycycline, and silver sulfADIAZINE. I am also having her maintain her Multiple Vitamins-Minerals (CENTRUM SILVER PO), vitamin C, timolol, Polyethyl Glycol-Propyl Glycol (SYSTANE OP), cholecalciferol, calcium carbonate, ibuprofen, simvastatin, traMADol, Fish Oil, and Biotin.  No orders of the defined types were placed in this encounter.    Follow-up: No follow-ups on file.  Walker Kehr, MD

## 2017-12-10 ENCOUNTER — Encounter: Payer: Self-pay | Admitting: Internal Medicine

## 2017-12-13 ENCOUNTER — Other Ambulatory Visit: Payer: Self-pay | Admitting: Internal Medicine

## 2017-12-13 MED ORDER — TRAMADOL HCL 50 MG PO TABS: 50.0000 mg | ORAL_TABLET | Freq: Three times a day (TID) | ORAL | 0 refills | Status: DC | PRN

## 2017-12-14 ENCOUNTER — Telehealth: Payer: Self-pay | Admitting: Internal Medicine

## 2017-12-14 NOTE — Telephone Encounter (Signed)
Copied from [place] [phone]. Topic: Quick Communication - See Telephone Encounter >> Dec 14, 2017  9:18 AM Conception Chancy, NT wrote: CRM for notification. See Telephone encounter for: 12/14/17.  Alyssa with [facility] pharmacy is calling and is needing clarification on rx traMADol (ULTRAM) 50 MG tablet. She needs to know how long 180 tablet supply is going to last the patient. If voicemail is left, medical assistant first and full last name must be included in message.   [phone]

## 2017-12-15 NOTE — Telephone Encounter (Signed)
Called Ayssa back no answer left detail msg stating rx is for a 90 day supply.Marland KitchenJohny Chess

## 2017-12-29 ENCOUNTER — Telehealth: Payer: Self-pay

## 2017-12-29 NOTE — Telephone Encounter (Signed)
Approved on May 28 PA Case: [id number], Status: Approved, Coverage Starts on: 12/27/2017 12:00:00 AM, Coverage Ends on: 01/10/2018 12:00:00 AM.

## 2018-02-27 DIAGNOSIS — H401331 Pigmentary glaucoma, bilateral, mild stage: Secondary | ICD-10-CM | POA: Diagnosis not present

## 2018-04-13 NOTE — Progress Notes (Addendum)
Subjective:   Morgan Huff is a 72 y.o. female who presents for Medicare Annual (Subsequent) preventive examination.  Review of Systems:  No ROS.  Medicare Wellness Visit. Additional risk factors are reflected in the social history.   Cardiac Risk Factors include: advanced age (>63men, >30 women);dyslipidemia Sleep patterns: feels rested on waking, gets up 1 times nightly to void and sleeps 8-10 hours nightly.   Home Safety/Smoke Alarms: Feels safe in home. Smoke alarms in place.  Living environment; residence and Firearm Safety: apartment, no firearms. Lives at [facility], independent apartment Seat Belt Safety/Bike Helmet: Wears seat belt.     Objective:     Vitals: BP 123/78   Pulse 75   Resp 16   Ht 6\' 2"  (1.88 m)   Wt 280 lb (127 kg)   SpO2 98%   BMI 35.95 kg/m   Body mass index is 35.95 kg/m.  Advanced Directives 04/14/2018 04/13/2017 06/05/2015 06/05/2015 05/28/2015  Does Patient Have a Medical Advance Directive? Yes Yes No Yes No  Type of Paramedic of [place];Living will [place];Living will - [place];Living will -  Copy of [place] in Chart? - No - copy requested No - copy requested Yes -  Would patient like information on creating a medical advance directive? - - No - patient declined information - No - patient declined information    Tobacco Social History   Tobacco Use  Smoking Status Never Smoker  Smokeless Tobacco Never Used     Counseling given: Not Answered  Past Medical History:  Diagnosis Date  . Colon polyps   . Diverticulosis of colon   . Fasting hyperglycemia   . Glaucoma    Dr Edilia Bo  . Hyperlipidemia    LDL goal = < 100; Lp(a) 112; hyperabsorber   Past Surgical History:  Procedure Laterality Date  . ANTERIOR AND POSTERIOR REPAIR WITH SACROSPINOUS FIXATION N/A 06/05/2015   Procedure: ANTERIOR AND POSTERIOR REPAIR WITH SACROSPINOUS LIGAMENT  SUSPENSION;  Surgeon: Louretta Shorten, MD;  Location: [facility];  Service: Gynecology;  Laterality: N/A;  . CATARACT EXTRACTION, BILATERAL     lens implant  . CHOLECYSTECTOMY    . CHOLECYSTECTOMY, LAPAROSCOPIC     Dr Margot Chimes  . COLONOSCOPY  2015   neg; due 2020  . colonoscopy with polypectomy     Dr Watt Climes; X 3  . EYE SURGERY     trabeculectomy ;Dr Kendall Flack.Cataract removal OD; Dr Scot Dock   Family History  Problem Relation Age of Onset  . Polycythemia Father        transition into leukemia  . Hypokalemia Father   . Leukemia Father   . Glaucoma Father   . Heart attack Father 33       in context hypokalemia  . Lung cancer Mother        smoker  . Heart attack Maternal Grandfather 73  . Breast cancer Maternal Grandmother   . Melanoma Brother   . Diabetes Neg Hx   . Stroke Neg Hx    Social History   Socioeconomic History  . Marital status: Single    Spouse name: Not on file  . Number of children: Not on file  . Years of education: Not on file  . Highest education level: Not on file  Occupational History  . Occupation: Mudlogger of shows, semi-retired  Social Needs  . Financial resource strain: Not hard at all  . Food insecurity:    Worry:  Never true    Inability: Never true  . Transportation needs:    Medical: Yes    Non-medical: Yes  Tobacco Use  . Smoking status: Never Smoker  . Smokeless tobacco: Never Used  Substance and Sexual Activity  . Alcohol use: Yes    Comment:  rarely  . Drug use: No  . Sexual activity: Not Currently  Lifestyle  . Physical activity:    Days per week: 3 days    Minutes per session: 30 min  . Stress: Not on file  Relationships  . Social connections:    Talks on phone: More than three times a week    Gets together: More than three times a week    Attends religious service: 1 to 4 times per year    Active member of club or organization: Yes    Attends meetings of clubs or organizations: More than 4 times per year    Relationship status: Not  on file  Other Topics Concern  . Not on file  Social History Narrative  . Not on file    Outpatient Encounter Medications as of 04/14/2018  Medication Sig  . Ascorbic Acid (VITAMIN C) 1000 MG tablet Take 1,000 mg by mouth daily.    . Biotin 5 MG TBDP Take by mouth.  . calcium carbonate (TUMS - DOSED IN MG ELEMENTAL CALCIUM) 500 MG chewable tablet Chew 2 tablets by mouth daily.   . cholecalciferol (VITAMIN D) 1000 units tablet Take 2 tablets (2,000 Units total) by mouth daily.  Marland Kitchen ibuprofen (ADVIL,MOTRIN) 600 MG tablet Take 1 tablet (600 mg total) by mouth 2 (two) times daily as needed for moderate pain. (Patient taking differently: Take 200 mg by mouth 2 (two) times daily as needed for moderate pain. )  . Multiple Vitamins-Minerals (CENTRUM SILVER PO) Take 1 tablet by mouth daily.    . Omega-3 Fatty Acids (FISH OIL) 1000 MG CAPS   . Polyethyl Glycol-Propyl Glycol (SYSTANE OP) Apply to eye daily as needed.  . simvastatin (ZOCOR) 10 MG tablet Take 1 tablet (10 mg total) by mouth daily.  . timolol (BETIMOL) 0.5 % ophthalmic solution Place 1 drop into the left eye daily.   . traMADol (ULTRAM) 50 MG tablet Take 1-2 tablets (50-100 mg total) by mouth every 8 (eight) hours as needed for severe pain.   No facility-administered encounter medications on file as of 04/14/2018.     Activities of Daily Living In your present state of health, do you have any difficulty performing the following activities: 04/14/2018  Hearing? N  Vision? N  Difficulty concentrating or making decisions? N  Walking or climbing stairs? N  Dressing or bathing? N  Doing errands, shopping? N  Preparing Food and eating ? N  Using the Toilet? N  In the past six months, have you accidently leaked urine? N  Do you have problems with loss of bowel control? N  Managing your Medications? N  Managing your Finances? N  Housekeeping or managing your Housekeeping? N  Some recent data might be hidden    Patient Care  Team: Plotnikov, Evie Lacks, MD as PCP - General (Internal Medicine) Clarene Essex, MD as Consulting Physician (Gastroenterology) Louretta Shorten, MD as Consulting Physician (Obstetrics and Gynecology) Stefanie Libel, MD as Consulting Physician (Sports Medicine) Dorna Leitz, MD as Consulting Physician (Orthopedic Surgery)    Assessment:   This is a routine wellness examination for [place]. Physical assessment deferred to PCP.   Exercise Activities and Dietary recommendations Current Exercise  Habits: Structured exercise class, Type of exercise: yoga;walking, Time (Minutes): 45, Frequency (Times/Week): 2, Weekly Exercise (Minutes/Week): 90, Intensity: Mild, Exercise limited by: orthopedic condition(s)  Diet (meal preparation, eat out, water intake, caffeinated beverages, dairy products, fruits and vegetables): in general, a "healthy" diet  , well balanced   Reviewed heart healthy diet. Encouraged patient to increase daily water and healthy fluid intake. Discussed weight loss strategies.    Goals    . I want to be more comfortable with phsical activity     Increase my exercise in the body areas that I feel weaker and take advantage of the new gym facility at [facility]    . Patient Stated     I want to increase physical activity by going yoga classes and work towards decreasing the amount of food I eat.        Fall Risk Fall Risk  04/14/2018 04/13/2017 09/07/2016 04/12/2016 04/11/2015  Falls in the past year? No Yes No No No  Number falls in past yr: - - - - -  Injury with Fall? - No - - -  Risk for fall due to : Impaired mobility - Other (Comment) - -     Depression Screen PHQ 2/9 Scores 04/14/2018 04/13/2017 09/07/2016 04/12/2016  PHQ - 2 Score 0 0 0 0  PHQ- 9 Score - 0 - -  Exception Documentation - - Other- indicate reason in comment box -     Cognitive Function MMSE - Mini Mental State Exam 04/13/2017  Orientation to time 5  Orientation to Place 5  Registration 3  Attention/  Calculation 5  Recall 3  Language- name 2 objects 2  Language- repeat 1  Language- follow 3 step command 3  Language- read & follow direction 1  Write a sentence 1  Copy design 1  Total score 30        Immunization History  Administered Date(s) Administered  . Influenza Whole 05/02/2012, 05/02/2013  . Influenza, High Dose Seasonal PF 04/11/2014, 04/12/2016, 04/13/2017  . Influenza-Unspecified 04/27/2015  . Pneumococcal Conjugate-13 04/11/2015  . Pneumococcal Polysaccharide-23 03/17/2011  . Td 02/20/2008  . Zoster 08/02/2010  . Zoster Recombinat (Shingrix) 09/08/2017, 11/24/2017   Screening Tests Health Maintenance  Topic Date Due  . TETANUS/TDAP  02/19/2018  . INFLUENZA VACCINE  12/02/2018 (Originally 03/02/2018)  . MAMMOGRAM  11/22/2019  . COLONOSCOPY  10/16/2024  . DEXA SCAN  Completed  . Hepatitis C Screening  Completed  . PNA vac Low Risk Adult  Completed      Plan:     Continue doing brain stimulating activities (puzzles, reading, adult coloring books, staying active) to keep memory sharp.   Continue to eat heart healthy diet (full of fruits, vegetables, whole grains, lean protein, water--limit salt, fat, and sugar intake) and increase physical activity as tolerated.  I have personally reviewed and noted the following in the patient's chart:   . Medical and social history . Use of alcohol, tobacco or illicit drugs  . Current medications and supplements . Functional ability and status . Nutritional status . Physical activity . Advanced directives . List of other physicians . Vitals . Screenings to include cognitive, depression, and falls . Referrals and appointments  In addition, I have reviewed and discussed with patient certain preventive protocols, quality metrics, and best practice recommendations. A written personalized care plan for preventive services as well as general preventive health recommendations were provided to patient.     Michiel Cowboy,  RN  04/14/2018  Medical screening examination/treatment/procedure(s) were performed by non-physician practitioner and as supervising physician I was immediately available for consultation/collaboration. I agree with above. Lew Dawes, MD

## 2018-04-14 ENCOUNTER — Ambulatory Visit (INDEPENDENT_AMBULATORY_CARE_PROVIDER_SITE_OTHER): Payer: Medicare Other | Admitting: *Deleted

## 2018-04-14 VITALS — BP 123/78 | HR 75 | Resp 16 | Ht 74.0 in | Wt 280.0 lb

## 2018-04-14 DIAGNOSIS — Z0000 Encounter for general adult medical examination without abnormal findings: Secondary | ICD-10-CM | POA: Diagnosis not present

## 2018-04-14 NOTE — Patient Instructions (Signed)
Continue doing brain stimulating activities (puzzles, reading, adult coloring books, staying active) to keep memory sharp.   Continue to eat heart healthy diet (full of fruits, vegetables, whole grains, lean protein, water--limit salt, fat, and sugar intake) and increase physical activity as tolerated.   Morgan Huff , Thank you for taking time to come for your Medicare Wellness Visit. I appreciate your ongoing commitment to your health goals. Please review the following plan we discussed and let me know if I can assist you in the future.   These are the goals we discussed: Goals    . I want to be more comfortable with phsical activity     Increase my exercise in the body areas that I feel weaker and take advantage of the new gym facility at [facility]    . Patient Stated     I want to increase physical activity by going yoga classes and work towards decreasing the amount of food I eat.        This is a list of the screening recommended for you and due dates:  Health Maintenance  Topic Date Due  . Tetanus Vaccine  02/19/2018  . Flu Shot  03/02/2018  . Mammogram  11/22/2019  . Colon Cancer Screening  10/16/2024  . DEXA scan (bone density measurement)  Completed  .  Hepatitis C: One time screening is recommended by Center for Disease Control  (CDC) for  adults born from [birth year] through 1965.   Completed  . Pneumonia vaccines  Completed   Health Maintenance, Female Adopting a healthy lifestyle and getting preventive care can go a long way to promote health and wellness. Talk with your health care provider about what schedule of regular examinations is right for you. This is a good chance for you to check in with your provider about disease prevention and staying healthy. In between checkups, there are plenty of things you can do on your own. Experts have done a lot of research about which lifestyle changes and preventive measures are most likely to keep you healthy. Ask your health care  provider for more information. Weight and diet Eat a healthy diet  Be sure to include plenty of vegetables, fruits, low-fat dairy products, and lean protein.  Do not eat a lot of foods high in solid fats, added sugars, or salt.  Get regular exercise. This is one of the most important things you can do for your health. ? Most adults should exercise for at least 150 minutes each week. The exercise should increase your heart rate and make you sweat (moderate-intensity exercise). ? Most adults should also do strengthening exercises at least twice a week. This is in addition to the moderate-intensity exercise.  Maintain a healthy weight  Body mass index (BMI) is a measurement that can be used to identify possible weight problems. It estimates body fat based on height and weight. Your health care provider can help determine your BMI and help you achieve or maintain a healthy weight.  For females 60 years of age and older: ? A BMI below 18.5 is considered underweight. ? A BMI of 18.5 to 24.9 is normal. ? A BMI of 25 to 29.9 is considered overweight. ? A BMI of 30 and above is considered obese.  Watch levels of cholesterol and blood lipids  You should start having your blood tested for lipids and cholesterol at 72 years of age, then have this test every 5 years.  You may need to have your cholesterol  levels checked more often if: ? Your lipid or cholesterol levels are high. ? You are older than 72 years of age. ? You are at high risk for heart disease.  Cancer screening Lung Cancer  Lung cancer screening is recommended for adults 30-47 years old who are at high risk for lung cancer because of a history of smoking.  A yearly low-dose CT scan of the lungs is recommended for people who: ? Currently smoke. ? Have quit within the past 15 years. ? Have at least a 30-pack-year history of smoking. A pack year is smoking an average of one pack of cigarettes a day for 1 year.  Yearly screening  should continue until it has been 15 years since you quit.  Yearly screening should stop if you develop a health problem that would prevent you from having lung cancer treatment.  Breast Cancer  Practice breast self-awareness. This means understanding how your breasts normally appear and feel.  It also means doing regular breast self-exams. Let your health care provider know about any changes, no matter how small.  If you are in your 20s or 30s, you should have a clinical breast exam (CBE) by a health care provider every 1-3 years as part of a regular health exam.  If you are 20 or older, have a CBE every year. Also consider having a breast X-ray (mammogram) every year.  If you have a family history of breast cancer, talk to your health care provider about genetic screening.  If you are at high risk for breast cancer, talk to your health care provider about having an MRI and a mammogram every year.  Breast cancer gene (BRCA) assessment is recommended for women who have family members with BRCA-related cancers. BRCA-related cancers include: ? Breast. ? Ovarian. ? Tubal. ? Peritoneal cancers.  Results of the assessment will determine the need for genetic counseling and BRCA1 and BRCA2 testing.  Cervical Cancer Your health care provider may recommend that you be screened regularly for cancer of the pelvic organs (ovaries, uterus, and vagina). This screening involves a pelvic examination, including checking for microscopic changes to the surface of your cervix (Pap test). You may be encouraged to have this screening done every 3 years, beginning at age 13.  For women ages 34-65, health care providers may recommend pelvic exams and Pap testing every 3 years, or they may recommend the Pap and pelvic exam, combined with testing for human papilloma virus (HPV), every 5 years. Some types of HPV increase your risk of cervical cancer. Testing for HPV may also be done on women of any age with  unclear Pap test results.  Other health care providers may not recommend any screening for nonpregnant women who are considered low risk for pelvic cancer and who do not have symptoms. Ask your health care provider if a screening pelvic exam is right for you.  If you have had past treatment for cervical cancer or a condition that could lead to cancer, you need Pap tests and screening for cancer for at least 20 years after your treatment. If Pap tests have been discontinued, your risk factors (such as having a new sexual partner) need to be reassessed to determine if screening should resume. Some women have medical problems that increase the chance of getting cervical cancer. In these cases, your health care provider may recommend more frequent screening and Pap tests.  Colorectal Cancer  This type of cancer can be detected and often prevented.  Routine colorectal cancer  screening usually begins at 72 years of age and continues through 72 years of age.  Your health care provider may recommend screening at an earlier age if you have risk factors for colon cancer.  Your health care provider may also recommend using home test kits to check for hidden blood in the stool.  A small camera at the end of a tube can be used to examine your colon directly (sigmoidoscopy or colonoscopy). This is done to check for the earliest forms of colorectal cancer.  Routine screening usually begins at age 84.  Direct examination of the colon should be repeated every 5-10 years through 72 years of age. However, you may need to be screened more often if early forms of precancerous polyps or small growths are found.  Skin Cancer  Check your skin from head to toe regularly.  Tell your health care provider about any new moles or changes in moles, especially if there is a change in a mole's shape or color.  Also tell your health care provider if you have a mole that is larger than the size of a pencil  eraser.  Always use sunscreen. Apply sunscreen liberally and repeatedly throughout the day.  Protect yourself by wearing long sleeves, pants, a wide-brimmed hat, and sunglasses whenever you are outside.  Heart disease, diabetes, and high blood pressure  High blood pressure causes heart disease and increases the risk of stroke. High blood pressure is more likely to develop in: ? People who have blood pressure in the high end of the normal range (130-139/85-89 mm Hg). ? People who are overweight or obese. ? People who are African American.  If you are 44-65 years of age, have your blood pressure checked every 3-5 years. If you are 19 years of age or older, have your blood pressure checked every year. You should have your blood pressure measured twice-once when you are at a hospital or clinic, and once when you are not at a hospital or clinic. Record the average of the two measurements. To check your blood pressure when you are not at a hospital or clinic, you can use: ? An automated blood pressure machine at a pharmacy. ? A home blood pressure monitor.  If you are between 30 years and 32 years old, ask your health care provider if you should take aspirin to prevent strokes.  Have regular diabetes screenings. This involves taking a blood sample to check your fasting blood sugar level. ? If you are at a normal weight and have a low risk for diabetes, have this test once every three years after 72 years of age. ? If you are overweight and have a high risk for diabetes, consider being tested at a younger age or more often. Preventing infection Hepatitis B  If you have a higher risk for hepatitis B, you should be screened for this virus. You are considered at high risk for hepatitis B if: ? You were born in a country where hepatitis B is common. Ask your health care provider which countries are considered high risk. ? Your parents were born in a high-risk country, and you have not been immunized  against hepatitis B (hepatitis B vaccine). ? You have HIV or AIDS. ? You use needles to inject street drugs. ? You live with someone who has hepatitis B. ? You have had sex with someone who has hepatitis B. ? You get hemodialysis treatment. ? You take certain medicines for conditions, including cancer, organ transplantation, and autoimmune  conditions.  Hepatitis C  Blood testing is recommended for: ? Everyone born from [birth year] through 1965. ? Anyone with known risk factors for hepatitis C.  Sexually transmitted infections (STIs)  You should be screened for sexually transmitted infections (STIs) including gonorrhea and chlamydia if: ? You are sexually active and are younger than 72 years of age. ? You are older than 72 years of age and your health care provider tells you that you are at risk for this type of infection. ? Your sexual activity has changed since you were last screened and you are at an increased risk for chlamydia or gonorrhea. Ask your health care provider if you are at risk.  If you do not have HIV, but are at risk, it may be recommended that you take a prescription medicine daily to prevent HIV infection. This is called pre-exposure prophylaxis (PrEP). You are considered at risk if: ? You are sexually active and do not regularly use condoms or know the HIV status of your partner(s). ? You take drugs by injection. ? You are sexually active with a partner who has HIV.  Talk with your health care provider about whether you are at high risk of being infected with HIV. If you choose to begin PrEP, you should first be tested for HIV. You should then be tested every 3 months for as long as you are taking PrEP. Pregnancy  If you are premenopausal and you may become pregnant, ask your health care provider about preconception counseling.  If you may become pregnant, take 400 to 800 micrograms (mcg) of folic acid every day.  If you want to prevent pregnancy, talk to your health  care provider about birth control (contraception). Osteoporosis and menopause  Osteoporosis is a disease in which the bones lose minerals and strength with aging. This can result in serious bone fractures. Your risk for osteoporosis can be identified using a bone density scan.  If you are 61 years of age or older, or if you are at risk for osteoporosis and fractures, ask your health care provider if you should be screened.  Ask your health care provider whether you should take a calcium or vitamin D supplement to lower your risk for osteoporosis.  Menopause may have certain physical symptoms and risks.  Hormone replacement therapy may reduce some of these symptoms and risks. Talk to your health care provider about whether hormone replacement therapy is right for you. Follow these instructions at home:  Schedule regular health, dental, and eye exams.  Stay current with your immunizations.  Do not use any tobacco products including cigarettes, chewing tobacco, or electronic cigarettes.  If you are pregnant, do not drink alcohol.  If you are breastfeeding, limit how much and how often you drink alcohol.  Limit alcohol intake to no more than 1 drink per day for nonpregnant women. One drink equals 12 ounces of beer, 5 ounces of Olan[place]aju Osborn, or 1 ounces of hard liquor.  Do not use street drugs.  Do not share needles.  Ask your health care provider for help if you need support or information about quitting drugs.  Tell your health care provider if you often feel depressed.  Tell your health care provider if you have ever been abused or do not feel safe at home. This information is not intended to replace advice given to you by your health care provider. Make sure you discuss any questions you have with your health care provider. Document Released: 02/01/2011 Document Revised: 12/25/2015 Document  Reviewed: 04/22/2015 Elsevier Interactive Patient Education  [employer].

## 2018-04-24 DIAGNOSIS — Z6841 Body Mass Index (BMI) 40.0 and over, adult: Secondary | ICD-10-CM | POA: Diagnosis not present

## 2018-04-24 DIAGNOSIS — Z01419 Encounter for gynecological examination (general) (routine) without abnormal findings: Secondary | ICD-10-CM | POA: Diagnosis not present

## 2018-04-27 DIAGNOSIS — Z23 Encounter for immunization: Secondary | ICD-10-CM | POA: Diagnosis not present

## 2018-05-04 DIAGNOSIS — M25551 Pain in right hip: Secondary | ICD-10-CM | POA: Diagnosis not present

## 2018-05-04 DIAGNOSIS — M545 Low back pain: Secondary | ICD-10-CM | POA: Diagnosis not present

## 2018-06-09 ENCOUNTER — Ambulatory Visit: Payer: Medicare Other | Admitting: Internal Medicine

## 2018-06-14 DIAGNOSIS — L438 Other lichen planus: Secondary | ICD-10-CM | POA: Diagnosis not present

## 2018-06-14 DIAGNOSIS — L821 Other seborrheic keratosis: Secondary | ICD-10-CM | POA: Diagnosis not present

## 2018-06-14 DIAGNOSIS — D1801 Hemangioma of skin and subcutaneous tissue: Secondary | ICD-10-CM | POA: Diagnosis not present

## 2018-06-14 DIAGNOSIS — L820 Inflamed seborrheic keratosis: Secondary | ICD-10-CM | POA: Diagnosis not present

## 2018-07-12 ENCOUNTER — Ambulatory Visit (INDEPENDENT_AMBULATORY_CARE_PROVIDER_SITE_OTHER): Payer: Medicare Other | Admitting: Internal Medicine

## 2018-07-12 ENCOUNTER — Encounter: Payer: Self-pay | Admitting: Internal Medicine

## 2018-07-12 ENCOUNTER — Other Ambulatory Visit (INDEPENDENT_AMBULATORY_CARE_PROVIDER_SITE_OTHER): Payer: Medicare Other

## 2018-07-12 VITALS — BP 126/86 | HR 80 | Temp 98.1°F | Ht 74.0 in | Wt 276.0 lb

## 2018-07-12 DIAGNOSIS — R739 Hyperglycemia, unspecified: Secondary | ICD-10-CM

## 2018-07-12 DIAGNOSIS — M25551 Pain in right hip: Secondary | ICD-10-CM

## 2018-07-12 DIAGNOSIS — G8929 Other chronic pain: Secondary | ICD-10-CM | POA: Diagnosis not present

## 2018-07-12 DIAGNOSIS — E785 Hyperlipidemia, unspecified: Secondary | ICD-10-CM | POA: Diagnosis not present

## 2018-07-12 DIAGNOSIS — D751 Secondary polycythemia: Secondary | ICD-10-CM | POA: Diagnosis not present

## 2018-07-12 LAB — HEPATIC FUNCTION PANEL
ALBUMIN: 4.4 g/dL (ref 3.5–5.2)
ALT: 13 U/L (ref 0–35)
AST: 17 U/L (ref 0–37)
Alkaline Phosphatase: 63 U/L (ref 39–117)
BILIRUBIN DIRECT: 0.1 mg/dL (ref 0.0–0.3)
BILIRUBIN TOTAL: 0.7 mg/dL (ref 0.2–1.2)
TOTAL PROTEIN: 7.3 g/dL (ref 6.0–8.3)

## 2018-07-12 LAB — BASIC METABOLIC PANEL
BUN: 26 mg/dL — ABNORMAL HIGH (ref 6–23)
CALCIUM: 10 mg/dL (ref 8.4–10.5)
CO2: 30 mEq/L (ref 19–32)
Chloride: 104 mEq/L (ref 96–112)
Creatinine, Ser: 0.81 mg/dL (ref 0.40–1.20)
GFR: 73.73 mL/min (ref >60.00)
Glucose, Bld: 98 mg/dL (ref 70–99)
Potassium: 4.2 mEq/L (ref 3.5–5.1)
Sodium: 141 mEq/L (ref 135–145)

## 2018-07-12 LAB — TSH: TSH: 2.36 u[IU]/mL (ref 0.35–4.50)

## 2018-07-12 LAB — HEMOGLOBIN A1C: Hgb A1c MFr Bld: 6.3 % (ref 4.6–6.5)

## 2018-07-12 MED ORDER — SIMVASTATIN 10 MG PO TABS: 10.0000 mg | ORAL_TABLET | Freq: Every day | ORAL | 3 refills | Status: DC

## 2018-07-12 NOTE — Progress Notes (Signed)
Subjective:  Patient ID: Morgan Huff, female    DOB: 01/07/1946  Age: 72 y.o. MRN: [id number]  CC: No chief complaint on file.   HPI KRISTYANA NOTTE presents for R hip, dyslipidemia, OA f/u  Outpatient Medications Prior to Visit  Medication Sig Dispense Refill  . Ascorbic Acid (VITAMIN C) 1000 MG tablet Take 1,000 mg by mouth daily.      . Biotin 5 MG TBDP Take by mouth.    . calcium carbonate (TUMS - DOSED IN MG ELEMENTAL CALCIUM) 500 MG chewable tablet Chew 2 tablets by mouth daily.     . cholecalciferol (VITAMIN D) 1000 units tablet Take 2 tablets (2,000 Units total) by mouth daily. 100 tablet 3  . ibuprofen (ADVIL,MOTRIN) 600 MG tablet Take 1 tablet (600 mg total) by mouth 2 (two) times daily as needed for moderate pain. (Patient taking differently: Take 200 mg by mouth 2 (two) times daily as needed for moderate pain. ) 180 tablet 0  . Multiple Vitamins-Minerals (CENTRUM SILVER PO) Take 1 tablet by mouth daily.      . NON FORMULARY Hemp oil    . Omega-3 Fatty Acids (FISH OIL) 1000 MG CAPS     . Polyethyl Glycol-Propyl Glycol (SYSTANE OP) Apply to eye daily as needed.    . simvastatin (ZOCOR) 10 MG tablet Take 1 tablet (10 mg total) by mouth daily. 90 tablet 3  . timolol (BETIMOL) 0.5 % ophthalmic solution Place 1 drop into the left eye daily.     . traMADol (ULTRAM) 50 MG tablet Take 1-2 tablets (50-100 mg total) by mouth every 8 (eight) hours as needed for severe pain. 180 tablet 0  . TURMERIC PO Take by mouth.     No facility-administered medications prior to visit.     ROS: Review of Systems  Constitutional: Negative.  Negative for activity change, appetite change, chills, diaphoresis, fatigue, fever and unexpected weight change.  HENT: Negative for congestion, ear pain, facial swelling, hearing loss, mouth sores, nosebleeds, postnasal drip, rhinorrhea, sinus pressure, sneezing, sore throat, tinnitus and trouble swallowing.   Eyes: Negative for pain, discharge,  redness, itching and visual disturbance.  Respiratory: Negative for cough, chest tightness, shortness of breath, wheezing and stridor.   Cardiovascular: Negative for chest pain, palpitations and leg swelling.  Gastrointestinal: Negative for abdominal distention, anal bleeding, blood in stool, constipation, diarrhea, nausea and rectal pain.  Genitourinary: Negative for difficulty urinating, dysuria, flank pain, frequency, genital sores, hematuria, pelvic pain, urgency, vaginal bleeding and vaginal discharge.  Musculoskeletal: Positive for arthralgias, back pain and gait problem. Negative for joint swelling, neck pain and neck stiffness.  Skin: Negative.  Negative for rash.  Neurological: Negative for dizziness, tremors, seizures, syncope, speech difficulty, weakness, numbness and headaches.  Hematological: Negative for adenopathy. Does not bruise/bleed easily.  Psychiatric/Behavioral: Negative for behavioral problems, decreased concentration, dysphoric mood, sleep disturbance and suicidal ideas. The patient is not nervous/anxious.     Objective:  BP 126/86 (BP Location: Left Arm, Patient Position: Sitting, Cuff Size: Large)   Pulse 80   Temp 98.1 F (36.7 C) (Oral)   Ht 6\' 2"  (1.88 m)   Wt 276 lb (125.2 kg)   SpO2 95%   BMI 35.44 kg/m   BP Readings from Last 3 Encounters:  07/12/18 126/86  04/14/18 123/78  12/09/17 126/82    Wt Readings from Last 3 Encounters:  07/12/18 276 lb (125.2 kg)  04/14/18 280 lb (127 kg)  12/09/17 277 lb (125.6 kg)  Physical Exam  Constitutional: She appears well-developed. No distress.  HENT:  Head: Normocephalic.  Right Ear: External ear normal.  Left Ear: External ear normal.  Nose: Nose normal.  Mouth/Throat: Oropharynx is clear and moist.  Eyes: Pupils are equal, round, and reactive to light. Conjunctivae are normal. Right eye exhibits no discharge. Left eye exhibits no discharge.  Neck: Normal range of motion. Neck supple. No JVD present.  No tracheal deviation present. No thyromegaly present.  Cardiovascular: Normal rate, regular rhythm and normal heart sounds.  Pulmonary/Chest: No stridor. No respiratory distress. She has no wheezes.  Abdominal: Soft. Bowel sounds are normal. She exhibits no distension and no mass. There is no tenderness. There is no rebound and no guarding.  Musculoskeletal: She exhibits tenderness. She exhibits no edema.  Lymphadenopathy:    She has no cervical adenopathy.  Neurological: She displays normal reflexes. No cranial nerve deficit. She exhibits normal muscle tone. Coordination abnormal.  Skin: No rash noted. No erythema.  Psychiatric: She has a normal mood and affect. Her behavior is normal. Judgment and thought content normal.  R hip tender R buttock tender [place]  Lab Results  Component Value Date   WBC 9.5 12/09/2017   HGB 15.0 12/09/2017   HCT 45.2 12/09/2017   PLT 311.0 12/09/2017   GLUCOSE 101 (H) 12/09/2017   CHOL 177 06/10/2017   TRIG 151.0 (H) 06/10/2017   HDL 52.90 06/10/2017   LDLDIRECT 85.6 02/20/2008   LDLCALC 94 06/10/2017   ALT 16 06/10/2017   AST 18 06/10/2017   NA 140 12/09/2017   K 4.5 12/09/2017   CL 104 12/09/2017   CREATININE 0.91 12/09/2017   BUN 23 12/09/2017   CO2 31 12/09/2017   TSH 2.52 04/12/2016   HGBA1C 6.0 06/10/2017   MICROALBUR 0.7 04/12/2016    No results found.  Assessment & Plan:   There are no diagnoses linked to this encounter.   No orders of the defined types were placed in this encounter.    Follow-up: No follow-ups on file.  Walker Kehr, MD

## 2018-07-12 NOTE — Patient Instructions (Addendum)
Cardiac CT calcium scoring test $150   Computed tomography, more commonly known as a CT or CAT scan, is a diagnostic medical imaging test. Like traditional x-rays, it produces multiple images or pictures of the inside of the body. The cross-sectional images generated during a CT scan can be reformatted in multiple planes. They can even generate three-dimensional images. These images can be viewed on a computer monitor, printed on film or by a 3D printer, or transferred to a CD or DVD. CT images of internal organs, bones, soft tissue and blood vessels provide greater detail than traditional x-rays, particularly of soft tissues and blood vessels. A cardiac CT scan for coronary calcium is a non-invasive way of obtaining information about the presence, location and extent of calcified plaque in the coronary arteries-the vessels that supply oxygen-containing blood to the heart muscle. Calcified plaque results when there is a build-up of fat and other substances under the inner layer of the artery. This material can calcify which signals the presence of atherosclerosis, a disease of the vessel wall, also called coronary artery disease (CAD). People with this disease have an increased risk for heart attacks. In addition, over time, progression of plaque build up (CAD) can narrow the arteries or even close off blood flow to the heart. The result may be chest pain, sometimes called "angina," or a heart attack. Because calcium is a marker of CAD, the amount of calcium detected on a cardiac CT scan is a helpful prognostic tool. The findings on cardiac CT are expressed as a calcium score. Another name for this test is coronary artery calcium scoring.  What are some common uses of the procedure? The goal of cardiac CT scan for calcium scoring is to determine if CAD is present and to what extent, even if there are no symptoms. It is a screening study that may be recommended by a physician for patients with risk factors  for CAD but no clinical symptoms. The major risk factors for CAD are: . high blood cholesterol levels  . family history of heart attacks  . diabetes  . high blood pressure  . cigarette smoking  . overweight or obese  . physical inactivity   A negative cardiac CT scan for calcium scoring shows no calcification within the coronary arteries. This suggests that CAD is absent or so minimal it cannot be seen by this technique. The chance of having a heart attack over the next two to five years is very low under these circumstances. A positive test means that CAD is present, regardless of whether or not the patient is experiencing any symptoms. The amount of calcification-expressed as the calcium score-may help to predict the likelihood of a myocardial infarction (heart attack) in the coming years and helps your medical doctor or cardiologist decide whether the patient may need to take preventive medicine or undertake other measures such as diet and exercise to lower the risk for heart attack. The extent of CAD is graded according to your calcium score:  Calcium Score  Presence of CAD  0 No evidence of CAD   1-10 Minimal evidence of CAD  11-100 Mild evidence of CAD  101-400 Moderate evidence of CAD  Over 400 Extensive evidence of CAD    Piriformis Syndrome Piriformis syndrome is a condition that can cause pain and numbness in your buttocks and down the back of your leg. Piriformis syndrome happens when the small muscle that connects the base of your spine to your hip (piriformis muscle) presses on  the nerve that runs down the back of your leg (sciatic nerve). The piriformis muscle helps your hip rotate and helps to bring your leg back and out. It also helps shift your weight while you are walking to keep you stable. The sciatic nerve runs under or through the piriformis. Damage to the piriformis muscle can cause spasms that put pressure on the nerve below. This causes pain and discomfort while  sitting and moving. The pain may feel as if it begins in the buttock and spreads (radiates) down your hip and thigh. What are the causes? This condition is caused by pressure on the sciatic nerve from the piriformis muscle. The piriformis muscle can get irritated with overuse, especially if other hip muscles are weak and the piriformis has to do extra work. Piriformis syndrome can also occur after an injury, like a fall onto your buttocks. What increases the risk? This condition is more likely to develop in:  Women.  People who sit for long periods of time.  Cyclists.  People who have weak buttocks muscles (gluteal muscles).  What are the signs or symptoms? Pain, tingling, or numbness that starts in the buttock and runs down the back of your leg (sciatica) is the most common symptom of this condition. Your symptoms may:  Get worse the longer you sit.  Get worse when you walk, run, or go up on stairs.  How is this diagnosed? This condition is diagnosed based on your symptoms, medical history, and physical exam. During this exam, your health care provider may move your leg into different positions to check for pain. He or she will also press on the muscles of your hip and buttock to see if that increases your symptoms. You may also have an X-ray or MRI. How is this treated? Treatment for this condition may include:  Stopping all activities that cause pain or make your condition worse.  Using heat or ice to relieve pain as told by your health care provider.  Taking medicines to reduce pain and swelling.  Taking a muscle relaxer to release the piriformis muscle.  Doing range-of-motion and strengthening exercises (physical therapy) as told by your health care provider.  Massaging the affected area.  Getting an injection of an anti-inflammatory medicine or muscle relaxer to reduce inflammation and muscle tension.  In rare cases, you may need surgery to cut the muscle and release  pressure on the nerve if other treatments do not work. Follow these instructions at home:  Take over-the-counter and prescription medicines only as told by your health care provider.  Do not sit for long periods. Get up and walk around every 20 minutes or as often as told by your health care provider.  If directed, apply heat to the affected area as often as told by your health care provider. Use the heat source that your health care provider recommends, such as a moist heat pack or a heating pad. ? Place a towel between your skin and the heat source. ? Leave the heat on for 20-30 minutes. ? Remove the heat if your skin turns bright red. This is especially important if you are unable to feel pain, heat, or cold. You may have a greater risk of getting burned.  If directed, apply ice to the injured area. ? Put ice in a plastic bag. ? Place a towel between your skin and the bag. ? Leave the ice on for 20 minutes, 2-3 times a day.  Do exercises as told by your  health care provider.  Return to your normal activities as told by your health care provider. Ask your health care provider what activities are safe for you.  Keep all follow-up visits as told by your health care provider. This is important. How is this prevented?  Do not sit for longer than 20 minutes at a time. When you sit, choose padded surfaces.  Warm up and stretch before being active.  Cool down and stretch after being active.  Give your body time to rest between periods of activity.  Make sure to use equipment that fits you.  Maintain physical fitness, including: ? Strength. ? Flexibility. Contact a health care provider if:  Your pain and stiffness continue or get worse.  Your leg or hip becomes weak.  You have changes in your bowel function or bladder function. This information is not intended to replace advice given to you by your health care provider. Make sure you discuss any questions you have with your  health care provider. Document Released: 07/19/2005 Document Revised: 03/23/2016 Document Reviewed: 07/01/2015 Elsevier Interactive Patient Education  [employer].

## 2018-07-12 NOTE — Assessment & Plan Note (Signed)
CBC

## 2018-07-12 NOTE — Assessment & Plan Note (Signed)
A1c

## 2018-07-12 NOTE — Assessment & Plan Note (Signed)
cardiac CT scan for calcium scoring offered Simvastatin

## 2018-07-12 NOTE — Assessment & Plan Note (Signed)
F/o w/Ortho

## 2018-07-13 ENCOUNTER — Ambulatory Visit: Payer: Medicare Other | Admitting: Internal Medicine

## 2018-07-31 DIAGNOSIS — H401331 Pigmentary glaucoma, bilateral, mild stage: Secondary | ICD-10-CM | POA: Diagnosis not present

## 2018-08-07 DIAGNOSIS — H401331 Pigmentary glaucoma, bilateral, mild stage: Secondary | ICD-10-CM | POA: Diagnosis not present

## 2018-08-10 DIAGNOSIS — M1611 Unilateral primary osteoarthritis, right hip: Secondary | ICD-10-CM | POA: Diagnosis not present

## 2018-08-15 DIAGNOSIS — M1611 Unilateral primary osteoarthritis, right hip: Secondary | ICD-10-CM | POA: Diagnosis not present

## 2018-08-21 ENCOUNTER — Other Ambulatory Visit: Payer: Self-pay | Admitting: Orthopedic Surgery

## 2018-09-14 ENCOUNTER — Other Ambulatory Visit: Payer: Self-pay | Admitting: Orthopedic Surgery

## 2018-09-14 NOTE — Care Plan (Signed)
Spoke with patient prior to surgery. Her sisters will assist her after surgery. Rolling walker ordered. She is planning to go to OPPT at this time. This plan can be changed if needed depending on how she does with therapy post op. She is aware of plan and agreeable. Choice offered.   Ladell Heads, [place]

## 2018-09-18 NOTE — Patient Instructions (Signed)
Morgan Huff  09/18/2018   Your procedure is scheduled on: Friday 09/22/2018  Report to [facility] Main  Entrance              Report to admitting at  0950  AM    Call this number if you have problems the morning of surgery [phone]    Remember: Do not eat food or drink liquids :After Midnight.               BRUSH YOUR TEETH MORNING OF SURGERY AND RINSE YOUR MOUTH OUT, NO CHEWING GUM CANDY OR MINTS.     Take these medicines the morning of surgery with A SIP OF WATER: use eye drops                                  You may not have any metal on your body including hair pins and              piercings  Do not wear jewelry, make-up, lotions, powders or perfumes, deodorant             Do not wear nail polish.  Do not shave  48 hours prior to surgery.              Do not bring valuables to the hospital. [place].  Contacts, dentures or bridgework may not be worn into surgery.  Leave suitcase in the car. After surgery it may be brought to your room.                   Please read over the following fact sheets you were given: _____________________________________________________________________             [facility] - Preparing for Surgery Before surgery, you can play an important role.  Because skin is not sterile, your skin needs to be as free of germs as possible.  You can reduce the number of germs on your skin by washing with CHG (chlorahexidine gluconate) soap before surgery.  CHG is an antiseptic cleaner which kills germs and bonds with the skin to continue killing germs even after washing. Please DO NOT use if you have an allergy to CHG or antibacterial soaps.  If your skin becomes reddened/irritated stop using the CHG and inform your nurse when you arrive at Short Stay. Do not shave (including legs and underarms) for at least 48 hours prior to the first CHG shower.  You may shave your  face/neck. Please follow these instructions carefully:  1.  Shower with CHG Soap the night before surgery and the  morning of Surgery.  2.  If you choose to wash your hair, wash your hair first as usual with your  normal  shampoo.  3.  After you shampoo, rinse your hair and body thoroughly to remove the  shampoo.                           4.  Use CHG as you would any other liquid soap.  You can apply chg directly  to the skin and wash  Gently with a scrungie or clean washcloth.  5.  Apply the CHG Soap to your body ONLY FROM THE NECK DOWN.   Do not use on face/ open                           Wound or open sores. Avoid contact with eyes, ears mouth and genitals (private parts).                       Wash face,  Genitals (private parts) with your normal soap.             6.  Wash thoroughly, paying special attention to the area where your surgery  will be performed.  7.  Thoroughly rinse your body with warm water from the neck down.  8.  DO NOT shower/wash with your normal soap after using and rinsing off  the CHG Soap.                9.  Pat yourself dry with a clean towel.            10.  Wear clean pajamas.            11.  Place clean sheets on your bed the night of your first shower and do not  sleep with pets. Day of Surgery : Do not apply any lotions/deodorants the morning of surgery.  Please wear clean clothes to the hospital/surgery center.  FAILURE TO FOLLOW THESE INSTRUCTIONS MAY RESULT IN THE CANCELLATION OF YOUR SURGERY PATIENT SIGNATURE_________________________________  NURSE SIGNATURE__________________________________  ________________________________________________________________________   Morgan Huff  An incentive spirometer is a tool that can help keep your lungs clear and active. This tool measures how well you are filling your lungs with each breath. Taking long deep breaths may help reverse or decrease the chance of developing breathing  (pulmonary) problems (especially infection) following:  A long period of time when you are unable to move or be active. BEFORE THE PROCEDURE   If the spirometer includes an indicator to show your best effort, your nurse or respiratory therapist will set it to a desired goal.  If possible, sit up straight or lean slightly forward. Try not to slouch.  Hold the incentive spirometer in an upright position. INSTRUCTIONS FOR USE  1. Sit on the edge of your bed if possible, or sit up as far as you can in bed or on a chair. 2. Hold the incentive spirometer in an upright position. 3. Breathe out normally. 4. Place the mouthpiece in your mouth and seal your lips tightly around it. 5. Breathe in slowly and as deeply as possible, raising the piston or the ball toward the top of the column. 6. Hold your breath for 3-5 seconds or for as long as possible. Allow the piston or ball to fall to the bottom of the column. 7. Remove the mouthpiece from your mouth and breathe out normally. 8. Rest for a few seconds and repeat Steps 1 through 7 at least 10 times every 1-2 hours when you are awake. Take your time and take a few normal breaths between deep breaths. 9. The spirometer may include an indicator to show your best effort. Use the indicator as a goal to work toward during each repetition. 10. After each set of 10 deep breaths, practice coughing to be sure your lungs are clear. If you have an incision (the cut made at the time of surgery),  support your incision when coughing by placing a pillow or rolled up towels firmly against it. Once you are able to get out of bed, walk around indoors and cough well. You may stop using the incentive spirometer when instructed by your caregiver.  RISKS AND COMPLICATIONS  Take your time so you do not get dizzy or light-headed.  If you are in pain, you may need to take or ask for pain medication before doing incentive spirometry. It is harder to take a deep breath if you  are having pain. AFTER USE  Rest and breathe slowly and easily.  It can be helpful to keep track of a log of your progress. Your caregiver can provide you with a simple table to help with this. If you are using the spirometer at home, follow these instructions: [place] IF:   You are having difficultly using the spirometer.  You have trouble using the spirometer as often as instructed.  Your pain medication is not giving enough relief while using the spirometer.  You develop fever of 100.5 F (38.1 C) or higher. SEEK IMMEDIATE MEDICAL CARE IF:   You cough up bloody sputum that had not been present before.  You develop fever of 102 F (38.9 C) or greater.  You develop worsening pain at or near the incision site. MAKE SURE YOU:   Understand these instructions.  Will watch your condition.  Will get help right away if you are not doing well or get worse. Document Released: 11/29/2006 Document Revised: 10/11/2011 Document Reviewed: 01/30/2007 Exit[employer] Patient Information 2014 [employer], [place].   ________________________________________________________________________  WHAT IS A BLOOD TRANSFUSION? Blood Transfusion Information  A transfusion is the replacement of blood or some of its parts. Blood is made up of multiple cells which provide different functions.  Red blood cells carry oxygen and are used for blood loss replacement.  White blood cells fight against infection.  Platelets control bleeding.  Plasma helps clot blood.  Other blood products are available for specialized needs, such as hemophilia or other clotting disorders. BEFORE THE TRANSFUSION  Who gives blood for transfusions?   Healthy volunteers who are fully evaluated to make sure their blood is safe. This is blood bank blood. Transfusion therapy is the safest it has ever been in the practice of medicine. Before blood is taken from a donor, a complete history is taken to make sure that person has  no history of diseases nor engages in risky social behavior (examples are intravenous drug use or sexual activity with multiple partners). The donor's travel history is screened to minimize risk of transmitting infections, such as malaria. The donated blood is tested for signs of infectious diseases, such as HIV and hepatitis. The blood is then tested to be sure it is compatible with you in order to minimize the chance of a transfusion reaction. If you or a relative donates blood, this is often done in anticipation of surgery and is not appropriate for emergency situations. It takes many days to process the donated blood. RISKS AND COMPLICATIONS Although transfusion therapy is very safe and saves many lives, the main dangers of transfusion include:   Getting an infectious disease.  Developing a transfusion reaction. This is an allergic reaction to something in the blood you were given. Every precaution is taken to prevent this. The decision to have a blood transfusion has been considered carefully by your caregiver before blood is given. Blood is not given unless the benefits outweigh the risks. AFTER THE TRANSFUSION  Right after receiving a blood transfusion, you will usually feel much better and more energetic. This is especially true if your red blood cells have gotten low (anemic). The transfusion raises the level of the red blood cells which carry oxygen, and this usually causes an energy increase.  The nurse administering the transfusion will monitor you carefully for complications. HOME CARE INSTRUCTIONS  No special instructions are needed after a transfusion. You may find your energy is better. Speak with your caregiver about any limitations on activity for underlying diseases you may have. SEEK MEDICAL CARE IF:   Your condition is not improving after your transfusion.  You develop redness or irritation at the intravenous (IV) site. SEEK IMMEDIATE MEDICAL CARE IF:  Any of the following  symptoms occur over the next 12 hours:  Shaking chills.  You have a temperature by mouth above 102 F (38.9 C), not controlled by medicine.  Chest, back, or muscle pain.  People around you feel you are not acting correctly or are confused.  Shortness of breath or difficulty breathing.  Dizziness and fainting.  You get a rash or develop hives.  You have a decrease in urine output.  Your urine turns a dark color or changes to pink, red, or brown. Any of the following symptoms occur over the next 10 days:  You have a temperature by mouth above 102 F (38.9 C), not controlled by medicine.  Shortness of breath.  Weakness after normal activity.  The white part of the eye turns yellow (jaundice).  You have a decrease in the amount of urine or are urinating less often.  Your urine turns a dark color or changes to pink, red, or brown. Document Released: 07/16/2000 Document Revised: 10/11/2011 Document Reviewed: 03/04/2008 [facility] Patient Information 2014 [place], [place].  _______________________________________________________________________

## 2018-09-19 ENCOUNTER — Encounter (HOSPITAL_COMMUNITY)
Admission: RE | Admit: 2018-09-19 | Discharge: 2018-09-19 | Disposition: A | Discharge status: Home / Self Care | Payer: Medicare Other | Source: Ambulatory Visit | Attending: Orthopedic Surgery | Admitting: Orthopedic Surgery

## 2018-09-19 ENCOUNTER — Other Ambulatory Visit: Payer: Self-pay

## 2018-09-19 ENCOUNTER — Encounter (HOSPITAL_COMMUNITY): Payer: Self-pay | Admitting: *Deleted

## 2018-09-19 ENCOUNTER — Ambulatory Visit (HOSPITAL_COMMUNITY)
Admission: RE | Admit: 2018-09-19 | Discharge: 2018-09-19 | Disposition: A | Discharge status: Home / Self Care | Payer: Medicare Other | Source: Ambulatory Visit | Attending: Orthopedic Surgery | Admitting: Orthopedic Surgery

## 2018-09-19 DIAGNOSIS — R011 Cardiac murmur, unspecified: Secondary | ICD-10-CM | POA: Diagnosis not present

## 2018-09-19 DIAGNOSIS — Z01811 Encounter for preprocedural respiratory examination: Secondary | ICD-10-CM | POA: Diagnosis not present

## 2018-09-19 HISTORY — DX: Cardiac arrhythmia, unspecified: I49.9

## 2018-09-19 HISTORY — DX: Prediabetes: R73.03

## 2018-09-19 HISTORY — DX: Cardiac murmur, unspecified: R01.1

## 2018-09-19 HISTORY — DX: Unspecified osteoarthritis, unspecified site: M19.90

## 2018-09-19 LAB — COMPREHENSIVE METABOLIC PANEL
ALT: 16 U/L (ref 0–44)
ANION GAP: 7 (ref 5–15)
AST: 18 U/L (ref 15–41)
Albumin: 4.1 g/dL (ref 3.5–5.0)
Alkaline Phosphatase: 67 U/L (ref 38–126)
BUN: 24 mg/dL — ABNORMAL HIGH (ref 8–23)
CO2: 28 mmol/L (ref 22–32)
Calcium: 9.3 mg/dL (ref 8.9–10.3)
Chloride: 104 mmol/L (ref 98–111)
Creatinine, Ser: 0.92 mg/dL (ref 0.44–1.00)
GFR calc Af Amer: >60 mL/min (ref >60)
GFR calc non Af Amer: >60 mL/min (ref >60)
Glucose, Bld: 105 mg/dL — ABNORMAL HIGH (ref 70–99)
Potassium: 4 mmol/L (ref 3.5–5.1)
Sodium: 139 mmol/L (ref 135–145)
Total Bilirubin: 0.7 mg/dL (ref 0.3–1.2)
Total Protein: 7.2 g/dL (ref 6.5–8.1)

## 2018-09-19 LAB — URINALYSIS, ROUTINE W REFLEX MICROSCOPIC
Bilirubin Urine: NEGATIVE
Glucose, UA: NEGATIVE mg/dL
Hgb urine dipstick: NEGATIVE
Ketones, ur: 5 mg/dL — AB
Nitrite: NEGATIVE
Protein, ur: NEGATIVE mg/dL
SPECIFIC GRAVITY, URINE: 1.02 (ref 1.005–1.030)
pH: 6 (ref 5.0–8.0)

## 2018-09-19 LAB — CBC WITH DIFFERENTIAL/PLATELET
Abs Immature Granulocytes: 0.04 10*3/uL (ref 0.00–0.07)
Basophils Absolute: 0 10*3/uL (ref 0.0–0.1)
Basophils Relative: 0 %
EOS PCT: 2 %
Eosinophils Absolute: 0.2 10*3/uL (ref 0.0–0.5)
HCT: 47.4 % — ABNORMAL HIGH (ref 36.0–46.0)
Hemoglobin: 15 g/dL (ref 12.0–15.0)
Immature Granulocytes: 0 %
LYMPHS PCT: 23 %
Lymphs Abs: 2.1 10*3/uL (ref 0.7–4.0)
MCH: 29.4 pg (ref 26.0–34.0)
MCHC: 31.6 g/dL (ref 30.0–36.0)
MCV: 92.9 fL (ref 80.0–100.0)
Monocytes Absolute: 0.9 10*3/uL (ref 0.1–1.0)
Monocytes Relative: 10 %
Neutro Abs: 5.7 10*3/uL (ref 1.7–7.7)
Neutrophils Relative %: 65 %
Platelets: 259 10*3/uL (ref 150–400)
RBC: 5.1 MIL/uL (ref 3.87–5.11)
RDW: 13.6 % (ref 11.5–15.5)
WBC: 8.9 10*3/uL (ref 4.0–10.5)
nRBC: 0 % (ref 0.0–0.2)

## 2018-09-19 LAB — PROTIME-INR
INR: 1
Prothrombin Time: 13.1 seconds (ref 11.4–15.2)

## 2018-09-19 LAB — APTT: aPTT: 30 seconds (ref 24–36)

## 2018-09-19 LAB — SURGICAL PCR SCREEN
MRSA, PCR: NEGATIVE
Staphylococcus aureus: NEGATIVE

## 2018-09-20 LAB — ABO/RH: ABO/RH(D): O NEG

## 2018-09-21 MED ORDER — BUPIVACAINE LIPOSOME 1.3 % IJ SUSP
10.0000 mL | INTRAMUSCULAR | Status: DC
  Filled 2018-09-21: qty 10

## 2018-09-21 MED ORDER — DEXTROSE 5 % IV SOLN
3.0000 g | INTRAVENOUS | Status: AC
  Administered 2018-09-22: 3 g via INTRAVENOUS
  Filled 2018-09-21: qty 3

## 2018-09-22 ENCOUNTER — Other Ambulatory Visit: Payer: Self-pay

## 2018-09-22 ENCOUNTER — Ambulatory Visit (HOSPITAL_COMMUNITY): Payer: Medicare Other | Admitting: Anesthesiology

## 2018-09-22 ENCOUNTER — Encounter (HOSPITAL_COMMUNITY): Payer: Self-pay | Admitting: *Deleted

## 2018-09-22 ENCOUNTER — Encounter (HOSPITAL_COMMUNITY)
Admission: RE | Disposition: A | Discharge status: Home / Self Care | Payer: Self-pay | Source: Other Acute Inpatient Hospital | Attending: Orthopedic Surgery

## 2018-09-22 ENCOUNTER — Observation Stay (HOSPITAL_COMMUNITY)
Admission: RE | Admit: 2018-09-22 | Discharge: 2018-09-23 | Disposition: A | Discharge status: Home / Self Care | Payer: Medicare Other | Source: Other Acute Inpatient Hospital | Attending: Orthopedic Surgery | Admitting: Orthopedic Surgery

## 2018-09-22 ENCOUNTER — Ambulatory Visit (HOSPITAL_COMMUNITY): Payer: Medicare Other

## 2018-09-22 ENCOUNTER — Ambulatory Visit (HOSPITAL_COMMUNITY): Payer: Medicare Other | Admitting: Physician Assistant

## 2018-09-22 DIAGNOSIS — Z419 Encounter for procedure for purposes other than remedying health state, unspecified: Secondary | ICD-10-CM

## 2018-09-22 DIAGNOSIS — R7303 Prediabetes: Secondary | ICD-10-CM | POA: Diagnosis not present

## 2018-09-22 DIAGNOSIS — E785 Hyperlipidemia, unspecified: Secondary | ICD-10-CM | POA: Diagnosis not present

## 2018-09-22 DIAGNOSIS — M1611 Unilateral primary osteoarthritis, right hip: Secondary | ICD-10-CM | POA: Diagnosis not present

## 2018-09-22 DIAGNOSIS — H409 Unspecified glaucoma: Secondary | ICD-10-CM | POA: Insufficient documentation

## 2018-09-22 DIAGNOSIS — Z96641 Presence of right artificial hip joint: Secondary | ICD-10-CM | POA: Diagnosis not present

## 2018-09-22 DIAGNOSIS — Z471 Aftercare following joint replacement surgery: Secondary | ICD-10-CM | POA: Diagnosis not present

## 2018-09-22 DIAGNOSIS — Z8249 Family history of ischemic heart disease and other diseases of the circulatory system: Secondary | ICD-10-CM | POA: Insufficient documentation

## 2018-09-22 HISTORY — PX: TOTAL HIP ARTHROPLASTY: SHX124

## 2018-09-22 LAB — TYPE AND SCREEN
ABO/RH(D): O NEG
Antibody Screen: NEGATIVE

## 2018-09-22 SURGERY — ARTHROPLASTY, HIP, TOTAL, ANTERIOR APPROACH
Anesthesia: Spinal | Site: Hip | Laterality: Right

## 2018-09-22 MED ORDER — BISACODYL 5 MG PO TBEC: 5.0000 mg | DELAYED_RELEASE_TABLET | Freq: Every day | ORAL | Status: DC | PRN

## 2018-09-22 MED ORDER — DOCUSATE SODIUM 100 MG PO CAPS: 100.0000 mg | ORAL_CAPSULE | Freq: Two times a day (BID) | ORAL | 0 refills | Status: DC

## 2018-09-22 MED ORDER — SODIUM CHLORIDE 0.9% FLUSH
INTRAVENOUS | Status: DC | PRN
  Administered 2018-09-22: 20 mL

## 2018-09-22 MED ORDER — ASPIRIN EC 325 MG PO TBEC: 325.0000 mg | DELAYED_RELEASE_TABLET | Freq: Two times a day (BID) | ORAL | 0 refills | Status: DC

## 2018-09-22 MED ORDER — PROPOFOL 10 MG/ML IV BOLUS
INTRAVENOUS | Status: DC | PRN
  Administered 2018-09-22 (×2): 20 mg via INTRAVENOUS

## 2018-09-22 MED ORDER — HYDROMORPHONE HCL 1 MG/ML IJ SOLN: 0.5000 mg | INTRAMUSCULAR | Status: DC | PRN

## 2018-09-22 MED ORDER — PROPOFOL 500 MG/50ML IV EMUL
INTRAVENOUS | Status: DC | PRN
  Administered 2018-09-22: 60 ug/kg/min via INTRAVENOUS

## 2018-09-22 MED ORDER — PHENYLEPHRINE HCL 10 MG/ML IJ SOLN
INTRAMUSCULAR | Status: AC
  Filled 2018-09-22: qty 1

## 2018-09-22 MED ORDER — METHOCARBAMOL 500 MG IVPB - SIMPLE MED
500.0000 mg | Freq: Four times a day (QID) | INTRAVENOUS | Status: DC | PRN
  Administered 2018-09-22: 500 mg via INTRAVENOUS
  Filled 2018-09-22: qty 50

## 2018-09-22 MED ORDER — BUPIVACAINE HCL (PF) 0.25 % IJ SOLN
INTRAMUSCULAR | Status: AC
  Filled 2018-09-22: qty 30

## 2018-09-22 MED ORDER — CEFAZOLIN SODIUM-DEXTROSE 2-4 GM/100ML-% IV SOLN
2.0000 g | Freq: Four times a day (QID) | INTRAVENOUS | Status: AC
  Administered 2018-09-22 (×2): 2 g via INTRAVENOUS
  Filled 2018-09-22 (×2): qty 100

## 2018-09-22 MED ORDER — WATER FOR IRRIGATION, STERILE IR SOLN
Status: DC | PRN
  Administered 2018-09-22: 2000 mL

## 2018-09-22 MED ORDER — ONDANSETRON HCL 4 MG/2ML IJ SOLN
INTRAMUSCULAR | Status: AC
  Filled 2018-09-22: qty 2

## 2018-09-22 MED ORDER — ALUM & MAG HYDROXIDE-SIMETH 200-200-20 MG/5ML PO SUSP: 30.0000 mL | ORAL | Status: DC | PRN

## 2018-09-22 MED ORDER — BUPIVACAINE LIPOSOME 1.3 % IJ SUSP
INTRAMUSCULAR | Status: DC | PRN
  Administered 2018-09-22: 10 mL

## 2018-09-22 MED ORDER — SODIUM CHLORIDE (PF) 0.9 % IJ SOLN
INTRAMUSCULAR | Status: AC
  Filled 2018-09-22: qty 20

## 2018-09-22 MED ORDER — MIDAZOLAM HCL 5 MG/5ML IJ SOLN
INTRAMUSCULAR | Status: DC | PRN
  Administered 2018-09-22: 2 mg via INTRAVENOUS

## 2018-09-22 MED ORDER — GABAPENTIN 300 MG PO CAPS
300.0000 mg | ORAL_CAPSULE | Freq: Two times a day (BID) | ORAL | Status: DC
  Administered 2018-09-22 – 2018-09-23 (×2): 300 mg via ORAL
  Filled 2018-09-22 (×2): qty 1

## 2018-09-22 MED ORDER — FENTANYL CITRATE (PF) 100 MCG/2ML IJ SOLN
INTRAMUSCULAR | Status: AC
  Filled 2018-09-22: qty 2

## 2018-09-22 MED ORDER — ACETAMINOPHEN 325 MG PO TABS: 325.0000 mg | ORAL_TABLET | Freq: Four times a day (QID) | ORAL | Status: DC | PRN

## 2018-09-22 MED ORDER — DOCUSATE SODIUM 100 MG PO CAPS
100.0000 mg | ORAL_CAPSULE | Freq: Two times a day (BID) | ORAL | Status: DC
  Administered 2018-09-22 – 2018-09-23 (×2): 100 mg via ORAL
  Filled 2018-09-22 (×2): qty 1

## 2018-09-22 MED ORDER — METHOCARBAMOL 500 MG IVPB - SIMPLE MED
INTRAVENOUS | Status: AC
  Filled 2018-09-22: qty 50

## 2018-09-22 MED ORDER — TIMOLOL HEMIHYDRATE 0.5 % OP SOLN
1.0000 [drp] | Freq: Two times a day (BID) | OPHTHALMIC | Status: DC
  Administered 2018-09-22 – 2018-09-23 (×2): 1 [drp] via OPHTHALMIC
  Filled 2018-09-22: qty 5

## 2018-09-22 MED ORDER — ONDANSETRON HCL 4 MG PO TABS: 4.0000 mg | ORAL_TABLET | Freq: Four times a day (QID) | ORAL | Status: DC | PRN

## 2018-09-22 MED ORDER — OXYCODONE-ACETAMINOPHEN 5-325 MG PO TABS: 1.0000 | ORAL_TABLET | Freq: Four times a day (QID) | ORAL | 0 refills | Status: DC | PRN

## 2018-09-22 MED ORDER — BUPIVACAINE IN DEXTROSE 0.75-8.25 % IT SOLN
INTRATHECAL | Status: DC | PRN
  Administered 2018-09-22: 2 mL via INTRATHECAL

## 2018-09-22 MED ORDER — SODIUM CHLORIDE 0.9 % IR SOLN
Status: DC | PRN
  Administered 2018-09-22: 1000 mL

## 2018-09-22 MED ORDER — PROPOFOL 10 MG/ML IV BOLUS
INTRAVENOUS | Status: AC
  Filled 2018-09-22: qty 60

## 2018-09-22 MED ORDER — MIDAZOLAM HCL 2 MG/2ML IJ SOLN
INTRAMUSCULAR | Status: AC
  Filled 2018-09-22: qty 2

## 2018-09-22 MED ORDER — TRANEXAMIC ACID-NACL 1000-0.7 MG/100ML-% IV SOLN
1000.0000 mg | INTRAVENOUS | Status: AC
  Administered 2018-09-22: 1000 mg via INTRAVENOUS
  Filled 2018-09-22: qty 100

## 2018-09-22 MED ORDER — LACTATED RINGERS IV SOLN
INTRAVENOUS | Status: DC
  Administered 2018-09-22 (×3): via INTRAVENOUS

## 2018-09-22 MED ORDER — TIZANIDINE HCL 2 MG PO TABS: 2.0000 mg | ORAL_TABLET | Freq: Three times a day (TID) | ORAL | 0 refills | Status: DC | PRN

## 2018-09-22 MED ORDER — ASPIRIN EC 325 MG PO TBEC
325.0000 mg | DELAYED_RELEASE_TABLET | Freq: Two times a day (BID) | ORAL | Status: DC
  Administered 2018-09-22 – 2018-09-23 (×2): 325 mg via ORAL
  Filled 2018-09-22 (×2): qty 1

## 2018-09-22 MED ORDER — TRANEXAMIC ACID-NACL 1000-0.7 MG/100ML-% IV SOLN
1000.0000 mg | Freq: Once | INTRAVENOUS | Status: AC
  Administered 2018-09-22: 1000 mg via INTRAVENOUS
  Filled 2018-09-22: qty 100

## 2018-09-22 MED ORDER — MAGNESIUM CITRATE PO SOLN: 1.0000 | Freq: Once | ORAL | Status: DC | PRN

## 2018-09-22 MED ORDER — DIPHENHYDRAMINE HCL 12.5 MG/5ML PO ELIX: 12.5000 mg | ORAL_SOLUTION | ORAL | Status: DC | PRN

## 2018-09-22 MED ORDER — FENTANYL CITRATE (PF) 100 MCG/2ML IJ SOLN
25.0000 ug | INTRAMUSCULAR | Status: DC | PRN
  Administered 2018-09-22: 50 ug via INTRAVENOUS

## 2018-09-22 MED ORDER — SODIUM CHLORIDE 0.9 % IV SOLN
INTRAVENOUS | Status: DC | PRN
  Administered 2018-09-22: 25 ug/min via INTRAVENOUS

## 2018-09-22 MED ORDER — OXYCODONE HCL 5 MG PO TABS
5.0000 mg | ORAL_TABLET | ORAL | Status: DC | PRN
  Administered 2018-09-22 – 2018-09-23 (×4): 10 mg via ORAL
  Filled 2018-09-22 (×4): qty 2

## 2018-09-22 MED ORDER — FENTANYL CITRATE (PF) 100 MCG/2ML IJ SOLN
INTRAMUSCULAR | Status: DC | PRN
  Administered 2018-09-22: 100 ug via INTRAVENOUS

## 2018-09-22 MED ORDER — SIMVASTATIN 10 MG PO TABS
10.0000 mg | ORAL_TABLET | Freq: Every day | ORAL | Status: DC
  Administered 2018-09-22: 10 mg via ORAL
  Filled 2018-09-22: qty 1

## 2018-09-22 MED ORDER — CHLORHEXIDINE GLUCONATE 4 % EX LIQD: 60.0000 mL | Freq: Once | CUTANEOUS | Status: DC

## 2018-09-22 MED ORDER — METHOCARBAMOL 500 MG PO TABS
500.0000 mg | ORAL_TABLET | Freq: Four times a day (QID) | ORAL | Status: DC | PRN
  Administered 2018-09-22: 500 mg via ORAL
  Filled 2018-09-22: qty 1

## 2018-09-22 MED ORDER — FENTANYL CITRATE (PF) 100 MCG/2ML IJ SOLN: 25.0000 ug | INTRAMUSCULAR | Status: DC | PRN

## 2018-09-22 MED ORDER — ONDANSETRON HCL 4 MG/2ML IJ SOLN
INTRAMUSCULAR | Status: DC | PRN
  Administered 2018-09-22: 4 mg via INTRAVENOUS

## 2018-09-22 MED ORDER — POLYETHYLENE GLYCOL 3350 17 G PO PACK: 17.0000 g | PACK | Freq: Every day | ORAL | Status: DC | PRN

## 2018-09-22 MED ORDER — DEXAMETHASONE SODIUM PHOSPHATE 10 MG/ML IJ SOLN
10.0000 mg | Freq: Two times a day (BID) | INTRAMUSCULAR | Status: DC
  Administered 2018-09-22 – 2018-09-23 (×2): 10 mg via INTRAVENOUS
  Filled 2018-09-22 (×2): qty 1

## 2018-09-22 MED ORDER — BUPIVACAINE HCL 0.25 % IJ SOLN
INTRAMUSCULAR | Status: DC | PRN
  Administered 2018-09-22: 30 mL

## 2018-09-22 MED ORDER — ONDANSETRON HCL 4 MG/2ML IJ SOLN: 4.0000 mg | Freq: Four times a day (QID) | INTRAMUSCULAR | Status: DC | PRN

## 2018-09-22 MED ORDER — DEXAMETHASONE SODIUM PHOSPHATE 10 MG/ML IJ SOLN
INTRAMUSCULAR | Status: AC
  Filled 2018-09-22: qty 1

## 2018-09-22 MED ORDER — SODIUM CHLORIDE 0.9 % IV SOLN
INTRAVENOUS | Status: DC
  Administered 2018-09-22 – 2018-09-23 (×2): via INTRAVENOUS

## 2018-09-22 MED ORDER — DEXAMETHASONE SODIUM PHOSPHATE 10 MG/ML IJ SOLN
INTRAMUSCULAR | Status: DC | PRN
  Administered 2018-09-22: 10 mg via INTRAVENOUS

## 2018-09-22 SURGICAL SUPPLY — 36 items
BAG ZIPLOCK 12X15 (MISCELLANEOUS) IMPLANT
BENZOIN TINCTURE PRP APPL 2/3 (GAUZE/BANDAGES/DRESSINGS) IMPLANT
BLADE SAW SGTL 18X1.27X75 (BLADE) ×2 IMPLANT
BLADE SURG SZ10 CARB STEEL (BLADE) ×4 IMPLANT
COVER PERINEAL POST (MISCELLANEOUS) ×2 IMPLANT
COVER SURGICAL LIGHT HANDLE (MISCELLANEOUS) ×2 IMPLANT
COVER WAND RF STERILE (DRAPES) IMPLANT
CUP ACETBLR 54 OD 100 SERIES (Hips) ×2 IMPLANT
DRAPE STERI IOBAN 125X83 (DRAPES) ×2 IMPLANT
DRAPE U-SHAPE 47X51 STRL (DRAPES) ×4 IMPLANT
DRSG AQUACEL AG ADV 3.5X 6 (GAUZE/BANDAGES/DRESSINGS) ×2 IMPLANT
DURAPREP 26ML APPLICATOR (WOUND CARE) ×2 IMPLANT
ELECT REM PT RETURN 15FT ADLT (MISCELLANEOUS) ×2 IMPLANT
ELIMINATOR HOLE APEX DEPUY (Hips) ×2 IMPLANT
GAUZE XEROFORM 1X8 LF (GAUZE/BANDAGES/DRESSINGS) ×2 IMPLANT
GLOVE BIOGEL PI IND STRL 8 (GLOVE) ×2 IMPLANT
GLOVE BIOGEL PI INDICATOR 8 (GLOVE) ×2
GLOVE ECLIPSE 7.5 STRL STRAW (GLOVE) ×4 IMPLANT
GOWN STRL REUS W/TWL XL LVL3 (GOWN DISPOSABLE) ×4 IMPLANT
HEAD CERAMIC DELTA 36 PLUS 1.5 (Hips) ×2 IMPLANT
HOLDER FOLEY CATH W/STRAP (MISCELLANEOUS) ×2 IMPLANT
HOOD PEEL AWAY FLYTE STAYCOOL (MISCELLANEOUS) ×4 IMPLANT
LINER NEUTRAL 36ID 54OD (Liner) ×2 IMPLANT
NEEDLE HYPO 22GX1.5 SAFETY (NEEDLE) ×2 IMPLANT
PACK ANTERIOR HIP CUSTOM (KITS) ×2 IMPLANT
STAPLER VISISTAT 35W (STAPLE) IMPLANT
STEM CORAIL KA12 (Stem) ×2 IMPLANT
STRIP CLOSURE SKIN 1/2X4 (GAUZE/BANDAGES/DRESSINGS) IMPLANT
SUT ETHIBOND NAB CT1 #1 30IN (SUTURE) ×4 IMPLANT
SUT MNCRL AB 3-0 PS2 18 (SUTURE) IMPLANT
SUT VIC AB 0 CT1 36 (SUTURE) ×2 IMPLANT
SUT VIC AB 1 CT1 36 (SUTURE) ×2 IMPLANT
TRAY FOL W/BAG SLVR 16FR STRL (SET/KITS/TRAYS/PACK) ×1 IMPLANT
TRAY FOLEY MTR SLVR 16FR STAT (SET/KITS/TRAYS/PACK) IMPLANT
TRAY FOLEY W/BAG SLVR 16FR LF (SET/KITS/TRAYS/PACK) ×1
YANKAUER SUCT BULB TIP 10FT TU (MISCELLANEOUS) ×2 IMPLANT

## 2018-09-22 NOTE — H&P (Addendum)
TOTAL HIP ADMISSION H&P  Patient is admitted for right total hip arthroplasty.  Subjective:  Chief Complaint: right hip pain  HPI: Morgan Huff, 73 y.o. female, has a history of pain and functional disability in the right hip(s) due to arthritis and patient has failed non-surgical conservative treatments for greater than 12 weeks to include NSAID's and/or analgesics, flexibility and strengthening excercises, weight reduction as appropriate and activity modification.  Onset of symptoms was gradual starting 3 years ago with gradually worsening course since that time.The patient noted no past surgery on the right hip(s).  Patient currently rates pain in the right hip at 9 out of 10 with activity. Patient has night pain, worsening of pain with activity and weight bearing, trendelenberg gait, pain that interfers with activities of daily living, pain with passive range of motion and joint swelling. Patient has evidence of subchondral sclerosis, periarticular osteophytes and joint space narrowing by imaging studies. This condition presents safety issues increasing the risk of falls. This patient has had Failure of all reasonable conservative care.  There is no current active infection.  Patient Active Problem List   Diagnosis Date Noted  . Hip pain, chronic, right 07/12/2018  . Abscess of breast 10/19/2017  . Cellulitis and abscess of left leg 07/27/2017  . Cough 06/11/2016  . Polycythemia, secondary 04/12/2016  . Well adult exam 04/12/2016  . Osteoarthritis of right hip 04/12/2016  . Prolapse of uterus 06/05/2015  . Bilateral leg weakness 04/06/2013  . Edema 04/06/2013  . Glaucoma 03/17/2011  . DIVERTICULOSIS, COLON 10/02/2010  . VARICOSE VEINS, LOWER EXTREMITIES, MILD 07/13/2010  . ELEVATED BLOOD PRESSURE WITHOUT DIAGNOSIS OF HYPERTENSION 04/10/2010  . PLANTAR FASCIITIS, LEFT 06/18/2009  . OTHER ACQUIRED DEFORMITY OF ANKLE AND FOOT OTHER 06/18/2009  . History of colonic polyps 03/25/2009   . Dyslipidemia 02/20/2008  . OTHER VOICE DISTURBANCE 02/20/2008  . Hyperglycemia 02/20/2008   Past Medical History:  Diagnosis Date  . Arthritis   . Colon polyps   . Diverticulosis of colon   . Dysrhythmia    palpitations recently  . Fasting hyperglycemia   . Glaucoma    Dr Edilia Bo  . Heart murmur   . Hyperlipidemia    LDL goal = < 100; Lp(a) 112; hyperabsorber  . Pre-diabetes    per patient    Past Surgical History:  Procedure Laterality Date  . ABDOMINAL HYSTERECTOMY    . ANTERIOR AND POSTERIOR REPAIR WITH SACROSPINOUS FIXATION N/A 06/05/2015   Procedure: ANTERIOR AND POSTERIOR REPAIR WITH SACROSPINOUS LIGAMENT SUSPENSION;  Surgeon: Louretta Shorten, MD;  Location: [facility];  Service: Gynecology;  Laterality: N/A;  . CATARACT EXTRACTION, BILATERAL     lens implant  . CHOLECYSTECTOMY    . CHOLECYSTECTOMY, LAPAROSCOPIC     Dr Margot Chimes  . COLONOSCOPY  2015   neg; due 2020  . colonoscopy with polypectomy     Dr Watt Climes; X 3  . EYE SURGERY     trabeculectomy ;Dr Kendall Flack.Cataract removal OD; Dr Scot Dock    Current Facility-Administered Medications  Medication Dose Route Frequency Provider Last Rate Last Dose  . bupivacaine liposome (EXPAREL) 1.3 % injection 133 mg  10 mL Infiltration On Call to OR [place], Drew A, RPH      . ceFAZolin (ANCEF) 3 g in dextrose 5 % 50 mL IVPB  3 g Intravenous On Call to [place], Cindie Laroche, RPH       Allergies  Allergen Reactions  . Latex Rash    Red rash around red bandaid  Social History   Tobacco Use  . Smoking status: Never Smoker  . Smokeless tobacco: Never Used  Substance Use Topics  . Alcohol use: Yes    Comment:  rarely    Family History  Problem Relation Age of Onset  . Polycythemia Father        transition into leukemia  . Hypokalemia Father   . Leukemia Father   . Glaucoma Father   . Heart attack Father 20       in context hypokalemia  . Lung cancer Mother        smoker  . Heart attack Maternal Grandfather 73  . Breast cancer  Maternal Grandmother   . Melanoma Brother   . Diabetes Neg Hx   . Stroke Neg Hx      ROS ROS: I have reviewed the patient's review of systems thoroughly and there are no positive responses as relates to the HPI. Objective:  Physical Exam  Vital signs in last 24 hours:    Vitals:   09/22/18 0737  BP: 138/81  Pulse: 89  Resp: 18  Temp: 98.4 F (36.9 C)  SpO2: 96%   Well-developed well-nourished patient in no acute distress. Alert and oriented x3 HEENT:within normal limits Cardiac: Regular rate and rhythm Pulmonary: Lungs clear to auscultation Abdomen: Soft and nontender.  Normal active bowel sounds  Musculoskeletal: Right hip: Painful range of motion.  Limited range of motion.  No instability. Labs: Recent Results (from the past 2160 hour(s))  TSH     Status: None   Collection Time: 07/12/18  3:27 PM  Result Value Ref Range   TSH 2.36 0.35 - 4.50 uIU/mL  Hepatic function panel     Status: None   Collection Time: 07/12/18  3:27 PM  Result Value Ref Range   Total Bilirubin 0.7 0.2 - 1.2 mg/dL   Bilirubin, Direct 0.1 0.0 - 0.3 mg/dL   Alkaline Phosphatase 63 39 - 117 U/L   AST 17 0 - 37 U/L   ALT 13 0 - 35 U/L   Total Protein 7.3 6.0 - 8.3 g/dL   Albumin 4.4 3.5 - 5.2 g/dL  Hemoglobin A1c     Status: None   Collection Time: 07/12/18  3:27 PM  Result Value Ref Range   Hgb A1c MFr Bld 6.3 4.6 - 6.5 %    Comment: Glycemic Control Guidelines for People with Diabetes:Non Diabetic:  <6%Goal of Therapy: <7%Additional Action Suggested:  >6%   Basic metabolic panel     Status: Abnormal   Collection Time: 07/12/18  3:27 PM  Result Value Ref Range   Sodium 141 135 - 145 mEq/L   Potassium 4.2 3.5 - 5.1 mEq/L   Chloride 104 96 - 112 mEq/L   CO2 30 19 - 32 mEq/L   Glucose, Bld 98 70 - 99 mg/dL   BUN 26 (H) 6 - 23 mg/dL   Creatinine, Ser 0.81 0.40 - 1.20 mg/dL   Calcium 10.0 8.4 - 10.5 mg/dL   GFR 73.73 >60.00 mL/min  Surgical pcr screen     Status: None   Collection  Time: 09/19/18  1:30 PM  Result Value Ref Range   MRSA, PCR NEGATIVE NEGATIVE   Staphylococcus aureus NEGATIVE NEGATIVE    Comment: (NOTE) The Xpert SA Assay (FDA approved for NASAL specimens in patients 94 years of age and older), is one component of a comprehensive surveillance program. It is not intended to diagnose infection nor to guide or monitor treatment. Performed at [facility]  [place] [address] [place], [place] [address]   Urinalysis, Routine w reflex microscopic     Status: Abnormal   Collection Time: 09/19/18  1:30 PM  Result Value Ref Range   Color, Urine YELLOW YELLOW   APPearance HAZY (A) CLEAR   Specific Gravity, Urine 1.020 1.005 - 1.030   pH 6.0 5.0 - 8.0   Glucose, UA NEGATIVE NEGATIVE mg/dL   Hgb urine dipstick NEGATIVE NEGATIVE   Bilirubin Urine NEGATIVE NEGATIVE   Ketones, ur 5 (A) NEGATIVE mg/dL   Protein, ur NEGATIVE NEGATIVE mg/dL   Nitrite NEGATIVE NEGATIVE   Leukocytes,Ua SMALL (A) NEGATIVE   RBC / HPF 0-5 0 - 5 RBC/hpf   WBC, UA 6-10 0 - 5 WBC/hpf   Bacteria, UA RARE (A) NONE SEEN   Squamous Epithelial / LPF 11-20 0 - 5   Mucus PRESENT    Hyaline Casts, UA PRESENT     Comment: Performed at [facility], [place] [address], [place], [place] [address]  APTT     Status: None   Collection Time: 09/19/18  2:18 PM  Result Value Ref Range   aPTT 30 24 - 36 seconds    Comment: Performed at [facility], [place] [address], [place], [place] [address]  CBC WITH DIFFERENTIAL     Status: Abnormal   Collection Time: 09/19/18  2:18 PM  Result Value Ref Range   WBC 8.9 4.0 - 10.5 K/uL   RBC 5.10 3.87 - 5.11 MIL/uL   Hemoglobin 15.0 12.0 - 15.0 g/dL   HCT 47.4 (H) 36.0 - 46.0 %   MCV 92.9 80.0 - 100.0 fL   MCH 29.4 26.0 - 34.0 pg   MCHC 31.6 30.0 - 36.0 g/dL   RDW 13.6 11.5 - 15.5 %   Platelets 259 150 - 400 K/uL   nRBC 0.0 0.0 - 0.2 %   Neutrophils Relative % 65 %   Neutro Abs 5.7 1.7 - 7.7 K/uL    Lymphocytes Relative 23 %   Lymphs Abs 2.1 0.7 - 4.0 K/uL   Monocytes Relative 10 %   Monocytes Absolute 0.9 0.1 - 1.0 K/uL   Eosinophils Relative 2 %   Eosinophils Absolute 0.2 0.0 - 0.5 K/uL   Basophils Relative 0 %   Basophils Absolute 0.0 0.0 - 0.1 K/uL   Immature Granulocytes 0 %   Abs Immature Granulocytes 0.04 0.00 - 0.07 K/uL    Comment: Performed at [facility], [place] [address], [place], [place] [address]  Comprehensive metabolic panel     Status: Abnormal   Collection Time: 09/19/18  2:18 PM  Result Value Ref Range   Sodium 139 135 - 145 mmol/L   Potassium 4.0 3.5 - 5.1 mmol/L   Chloride 104 98 - 111 mmol/L   CO2 28 22 - 32 mmol/L   Glucose, Bld 105 (H) 70 - 99 mg/dL   BUN 24 (H) 8 - 23 mg/dL   Creatinine, Ser 0.92 0.44 - 1.00 mg/dL   Calcium 9.3 8.9 - 10.3 mg/dL   Total Protein 7.2 6.5 - 8.1 g/dL   Albumin 4.1 3.5 - 5.0 g/dL   AST 18 15 - 41 U/L   ALT 16 0 - 44 U/L   Alkaline Phosphatase 67 38 - 126 U/L   Total Bilirubin 0.7 0.3 - 1.2 mg/dL   GFR calc non Af Amer >60 >60 mL/min   GFR calc Af Amer >60 >60 mL/min   Anion  gap 7 5 - 15    Comment: Performed at [facility], [place] [address], [place], [place] [address]  Protime-INR     Status: None   Collection Time: 09/19/18  2:18 PM  Result Value Ref Range   Prothrombin Time 13.1 11.4 - 15.2 seconds   INR 1.00     Comment: Performed at [facility], [place] [address], [place], [place] [address]  Type and screen Order type and screen if day of surgery is less than 15 days from draw of preadmission visit or order morning of surgery if day of surgery is greater than 6 days from preadmission visit.     Status: None   Collection Time: 09/19/18  2:18 PM  Result Value Ref Range   ABO/RH(D) O NEG    Antibody Screen NEG    Sample Expiration 10/03/2018    Extend sample reason      NO TRANSFUSIONS OR PREGNANCY IN THE PAST 3 MONTHS Performed at [facility], [place] [address], [place], [place] [address]   ABO/Rh     Status: None   Collection Time: 09/19/18  2:18 PM  Result Value Ref Range   ABO/RH(D)      Jenetta Downer NEG Performed at [place] [address], [place], [place] [address]     Estimated body mass index is 35.56 kg/m as calculated from the following:   Height as of 09/19/18: 6\' 2"  (1.88 m).   Weight as of 09/19/18: 125.6 kg.   Imaging Review Plain radiographs demonstrate severe degenerative joint disease of the right hip(s). The bone quality appears to be good for age and reported activity level.      Assessment/Plan:  End stage arthritis, right hip(s)  The patient history, physical examination, clinical judgement of the provider and imaging studies are consistent with end stage degenerative joint disease of the right hip(s) and total hip arthroplasty is deemed medically necessary. The treatment options including medical management, injection therapy, arthroscopy and arthroplasty were discussed at length. The risks and benefits of total hip arthroplasty were presented and reviewed. The risks due to aseptic loosening, infection, stiffness, dislocation/subluxation,  thromboembolic complications and other imponderables were discussed.  The patient acknowledged the explanation, agreed to proceed with the plan and consent was signed. Patient is being admitted for inpatient treatment for surgery, pain control, PT, OT, prophylactic antibiotics, VTE prophylaxis, progressive ambulation and ADL's and discharge planning.The patient is planning to be discharged home with home health services

## 2018-09-22 NOTE — Brief Op Note (Signed)
09/22/2018  11:23 AM  PATIENT:  Morgan Huff  73 y.o. female  PRE-OPERATIVE DIAGNOSIS:  OSTEOARTHRITIS RIGHT HIP  POST-OPERATIVE DIAGNOSIS:  osteoarthritis right hip  PROCEDURE:  Procedure(s): TOTAL HIP ARTHROPLASTY ANTERIOR APPROACH (Right)  SURGEON:  Surgeon(s) and Role:    Dorna Leitz, MD - Primary  PHYSICIAN ASSISTANT:   ASSISTANTS: jim bethune   ANESTHESIA:   spinal  EBL:  500 mL   BLOOD ADMINISTERED:none  DRAINS: none   LOCAL MEDICATIONS USED:  MARCAINE    and OTHER experel  SPECIMEN:  No Specimen  DISPOSITION OF SPECIMEN:  N/A  COUNTS:  YES  TOURNIQUET:  * No tourniquets in log *  DICTATION: .Other Dictation: Dictation Number [id number]  PLAN OF CARE: Admit for overnight observation  PATIENT DISPOSITION:  PACU - hemodynamically stable.   Delay start of Pharmacological VTE agent (>24hrs) due to surgical blood loss or risk of bleeding: no

## 2018-09-22 NOTE — Anesthesia Preprocedure Evaluation (Addendum)
Anesthesia Evaluation  Patient identified by MRN, date of birth, ID band Patient awake    Reviewed: Allergy & Precautions, NPO status , Patient's Chart, lab work & pertinent test results  Airway Mallampati: II  TM Distance: >3 FB     Dental   Pulmonary neg pulmonary ROS,    breath sounds clear to auscultation       Cardiovascular + dysrhythmias + Valvular Problems/Murmurs  Rhythm:Regular Rate:Normal     Neuro/Psych    GI/Hepatic negative GI ROS, Neg liver ROS,   Endo/Other    Renal/GU negative Renal ROS     Musculoskeletal   Abdominal   Peds  Hematology   Anesthesia Other Findings   Reproductive/Obstetrics                             Anesthesia Physical Anesthesia Plan  ASA: III  Anesthesia Plan: Spinal   Post-op Pain Management:    Induction: Intravenous  PONV Risk Score and Plan: 2 and Ondansetron, Dexamethasone and Midazolam  Airway Management Planned: Nasal Cannula and Simple Face Mask  Additional Equipment:   Intra-op Plan:   Post-operative Plan:   Informed Consent: I have reviewed the patients History and Physical, chart, labs and discussed the procedure including the risks, benefits and alternatives for the proposed anesthesia with the patient or authorized representative who has indicated his/her understanding and acceptance.     Dental advisory given  Plan Discussed with: CRNA and Anesthesiologist  Anesthesia Plan Comments:        Anesthesia Quick Evaluation

## 2018-09-22 NOTE — Discharge Instructions (Signed)

## 2018-09-22 NOTE — Anesthesia Procedure Notes (Signed)
Procedure Name: MAC Date/Time: 09/22/2018 9:38 AM Performed by: Lissa Morales, CRNA Pre-anesthesia Checklist: Emergency Drugs available, Suction available, Patient being monitored, Timeout performed and Patient identified Patient Re-evaluated:Patient Re-evaluated prior to induction Oxygen Delivery Method: Simple face mask Placement Confirmation: positive ETCO2

## 2018-09-22 NOTE — Evaluation (Signed)
Physical Therapy Evaluation Patient Details Name: Morgan Huff MRN: [id number] DOB: [birth year] Today's Date: 09/22/2018   History of Present Illness  73 yo female s/p R DA-THA on 09/22/18. PMH includes hip pain, diverticulosis, bilateral LE weakness, glaucoma, pre-DM.   Clinical Impression   Pt presents with R hip pain, post-surgical R hip weakness, difficulty with transfers from low surfaces, antalgic gait, and decreased activity tolerance. Pt to benefit from acute PT to address deficits. Pt ambulated hallway distance with RW with min guard assist, verbal cuing provided throughout. Pt educated on ankle pumps (20/hour) to perform this afternoon/evening to increase circulation, to pt's tolerance and limited by pain. PT to progress mobility as tolerated, and will continue to follow acutely.        Follow Up Recommendations Follow surgeon's recommendation for DC plan and follow-up therapies;Supervision for mobility/OOB    Equipment Recommendations  Rolling walker with 5" wheels    Recommendations for Other Services       Precautions / Restrictions Precautions Precautions: Fall Restrictions Weight Bearing Restrictions: No Other Position/Activity Restrictions: WBAT       Mobility  Bed Mobility Overal bed mobility: Needs Assistance             General bed mobility comments: Pt up in chair upon PT arrival and wanted to stay up in chair upon PT exit.   Transfers Overall transfer level: Needs assistance Equipment used: Rolling walker (2 wheeled) Transfers: Sit to/from Stand Sit to Stand: From elevated surface;Min assist         General transfer comment: Min assist for power up and steadying. Pt tall and has difficulty with standing from recliner. VC for hand placement with rising.   Ambulation/Gait Ambulation/Gait assistance: Min guard;+2 safety/equipment Gait Distance (Feet): 80 Feet Assistive device: Rolling walker (2 wheeled) Gait Pattern/deviations: Step-to  pattern;Decreased stance time - right;Decreased weight shift to right;Antalgic Gait velocity: decr    General Gait Details: Min guard for safety. Verbal cuing for placement in RW, sequencing x3, turning with RW.   Stairs            Wheelchair Mobility    Modified Rankin (Stroke Patients Only)       Balance Overall balance assessment: Mild deficits observed, not formally tested                                           Pertinent Vitals/Pain Pain Assessment: 0-10 Pain Score: 4  Pain Location: R hip  Pain Descriptors / Indicators: Aching Pain Intervention(s): Limited activity within patient's tolerance;Repositioned;Ice applied;Monitored during session    Home Living Family/patient expects to be discharged to:: Private residence([facility]) Living Arrangements: Alone Available Help at Discharge: Family;Available 24 hours/day(Pt's sister to stay with her for the first week) Type of Home: House Home Access: Level entry     Home Layout: One level Home Equipment: Grab bars - tub/shower;Grab bars - toilet;Cane - single point      Prior Function Level of Independence: Independent               Hand Dominance   Dominant Hand: Right    Extremity/Trunk Assessment   Upper Extremity Assessment Upper Extremity Assessment: Overall WFL for tasks assessed    Lower Extremity Assessment Lower Extremity Assessment: Overall WFL for tasks assessed;RLE deficits/detail RLE Deficits / Details: suspected post-surgical hip weakness; able to perform LAQ with lift assist, ankle pumps  RLE Sensation: WNL    Cervical / Trunk Assessment Cervical / Trunk Assessment: Normal  Communication   Communication: No difficulties  Cognition Arousal/Alertness: Awake/alert Behavior During Therapy: WFL for tasks assessed/performed Overall Cognitive Status: Within Functional Limits for tasks assessed                                 General Comments: Pt  inquisitive and actively engaged in session       General Comments      Exercises     Assessment/Plan    PT Assessment Patient needs continued PT services  PT Problem List Decreased strength;Pain;Decreased activity tolerance;Decreased knowledge of use of DME;Decreased balance;Decreased mobility       PT Treatment Interventions DME instruction;Therapeutic activities;Gait training;Therapeutic exercise;Patient/family education;Balance training;Functional mobility training    PT Goals (Current goals can be found in the Care Plan section)  Acute Rehab PT Goals Patient Stated Goal: go home, do OPPT  PT Goal Formulation: With patient Time For Goal Achievement: 09/29/18 Potential to Achieve Goals: Good    Frequency 7X/week   Barriers to discharge        Co-evaluation               AM-PAC PT "6 Clicks" Mobility  Outcome Measure Help needed turning from your back to your side while in a flat bed without using bedrails?: A Little Help needed moving from lying on your back to sitting on the side of a flat bed without using bedrails?: A Little Help needed moving to and from a bed to a chair (including a wheelchair)?: A Little Help needed standing up from a chair using your arms (e.g., wheelchair or bedside chair)?: A Little Help needed to walk in hospital room?: A Little Help needed climbing 3-5 steps with a railing? : A Little 6 Click Score: 18    End of Session Equipment Utilized During Treatment: Gait belt Activity Tolerance: Patient tolerated treatment well Patient left: in chair;with chair alarm set;with call bell/phone within reach;with family/visitor present;with SCD's reapplied Nurse Communication: Mobility status PT Visit Diagnosis: Other abnormalities of gait and mobility (R26.89);Difficulty in walking, not elsewhere classified (R26.2)    Time: 1950-9326 PT Time Calculation (min) (ACUTE ONLY): 27 min   Charges:   PT Evaluation $PT Eval Low Complexity: 1  Low PT Treatments $Gait Training: 8-22 mins        Julien Girt, PT Acute Rehabilitation Services Pager [phone]  Office [phone]   Roxine Caddy D Elonda Husky 09/22/2018, 7:23 PM

## 2018-09-22 NOTE — Transfer of Care (Signed)
Immediate Anesthesia Transfer of Care Note  Patient: Morgan Huff  Procedure(s) Performed: TOTAL HIP ARTHROPLASTY ANTERIOR APPROACH (Right Hip)  Patient Location: PACU  Anesthesia Type:Spinal  Level of Consciousness: awake, alert , oriented and patient cooperative  Airway & Oxygen Therapy: Patient Spontanous Breathing and Patient connected to face mask oxygen  Post-op Assessment: Report given to RN and Post -op Vital signs reviewed and stable  Post vital signs: stable  Last Vitals:  Vitals Value Taken Time  BP 120/73 09/22/2018 12:06 PM  Temp 36.5 C 09/22/2018 12:05 PM  Pulse 87 09/22/2018 12:10 PM  Resp 17 09/22/2018 12:10 PM  SpO2 95 % 09/22/2018 12:10 PM  Vitals shown include unvalidated device data.  Last Pain:  Vitals:   09/22/18 0811  TempSrc:   PainSc: 0-No pain      Patients Stated Pain Goal: 3 (83/29/19 1660)  Complications: No apparent anesthesia complications

## 2018-09-22 NOTE — Anesthesia Postprocedure Evaluation (Signed)
Anesthesia Post Note  Patient: Morgan Huff  Procedure(s) Performed: TOTAL HIP ARTHROPLASTY ANTERIOR APPROACH (Right Hip)     Patient location during evaluation: PACU Anesthesia Type: Spinal Level of consciousness: awake Pain management: pain level controlled Vital Signs Assessment: post-procedure vital signs reviewed and stable Respiratory status: spontaneous breathing Cardiovascular status: unstable Postop Assessment: no apparent nausea or vomiting Anesthetic complications: no    Last Vitals:  Vitals:   09/22/18 1245 09/22/18 1300  BP: 125/80 124/74  Pulse: 83 78  Resp: (!) 21 10  Temp:  36.4 C  SpO2: 95% 94%    Last Pain:  Vitals:   09/22/18 1300  TempSrc:   PainSc: 4                  Ariah Mower

## 2018-09-22 NOTE — Care Plan (Signed)
Ortho Bundle Case Management Note  Patient Details  Name: Morgan Huff MRN: [id number] Date of Birth: 1946-07-05     Spoke with patient prior to surgery. Her sisters will assist her after surgery. Rolling walker ordered. She is planning to go to OPPT at this time. This plan can be changed if needed depending on how she does with therapy post op. She is aware of plan and agreeable. Choice offered.      Patient lives in the [place] apartments at [place]. [facility] is aware of patient and can provide HHPT if needed.    DME Arranged:  [place] Rile tall DME Agency:  [employer]  HH Arranged:    [place] Agency:     Additional Comments: Please contact me with any questions of if this plan should need to change.  Ladell Heads,  [facility]  [phone] 09/22/2018, 9:26 AM

## 2018-09-22 NOTE — Anesthesia Procedure Notes (Signed)
Spinal  Patient location during procedure: OR End time: 09/22/2018 9:48 AM Staffing Resident/CRNA: Lissa Morales, CRNA Performed: resident/CRNA  Preanesthetic Checklist Completed: patient identified, site marked, surgical consent, pre-op evaluation, timeout performed, IV checked, risks and benefits discussed and monitors and equipment checked Spinal Block Patient position: sitting Prep: DuraPrep Patient monitoring: heart rate, continuous pulse ox and blood pressure Approach: midline Location: L3-4 Injection technique: single-shot Needle Needle type: Pencan  Needle gauge: 24 G Needle length: 9 cm Additional Notes Expiration date of kit checked and confirmed. Patient tolerated procedure well, without complications.

## 2018-09-22 NOTE — Op Note (Signed)
NAME: RUSSIA, SCHEIDERER MEDICAL RECORD ZO:[id number] ACCOUNT [id number] DATE OF BIRTH:[birth year] FACILITY: [facility] LOCATION: [facility] PHYSICIAN:Jalisha Enneking L. Abem Shaddix, MD  OPERATIVE REPORT  DATE OF PROCEDURE:  09/22/2018  PREOPERATIVE DIAGNOSIS:  End-stage degenerative joint disease, right hip, with limitations of motion and pain.  POSTOPERATIVE DIAGNOSIS:  End-stage degenerative joint disease, right hip, with limitations of motion and pain.  PROCEDURE: 1.  Right total hip replacement with a Corail stem size 12, 54 mm Pinnacle cup, neutral liner, and a 36 mm +1.5 delta ceramic hip ball. 2.  Interpretation of multiple intraoperative fluoroscopic images.  SURGEON:  Dorna Leitz, MD  ASSISTANT:  Gaspar Skeeters, PA-C, who was present for the entire case and assisted by manipulation of the leg, retraction, and closing to minimize OR time.  BRIEF HISTORY:  The patient is a 73 year old female with a long history of significant complaints of right hip pain.  She has been treated conservatively for a long period of time.  After failure of conservative care and x-ray showing bone-on-bone change  and the patient was having light activity pain and night pain, she was taken to the operating room for right total hip replacement.  DESCRIPTION OF PROCEDURE:  The patient was taken to the operating room after adequate anesthesia was obtained with a spinal anesthetic.  The patient was placed supine on the Hana bed.  The right hip was then prepped and draped for an anterior approach to  the hip.  Subcutaneous tissue was dissected down to the level of the right hip.  The right hip was then prepped and draped in the usual sterile fashion, and following this, an incision was made for an anterior approach to the hip.  Subcutaneous tissue  was dissected down to the level of the tensor fascia, which was clearly identified and then it was incised.  The muscle was then finger dissected and retractors put in place above and below  the neck.  The capsule was opened and provisional neck cut made  after releasing the anterior capsule.  Following this, the head was removed and sized on the back table.  Retractors were put in place above and below the acetabulum.  The acetabulum was sequentially reamed to a level of 53 mm, and a 54 mm Pinnacle cup  was hammered into place, 45 degrees of lateral opening, 30 degrees of anteversion.  Once this was completed, a neutral liner was placed.  Attention was then turned towards the stem.  It was externally rotated, extended, and adducted.  Following this, the  canal was opened with a cookie cutter, followed by a chili pepper, followed by sequential rasping up to a level of 12 mm, up to a level of size 12.  Size 12 then had a provisional reduction taken.  It looked to be just a touch long.  At that point, we  brought it back up into position, took the 12 down a little farther, calcar planed it and then opened a 12, put it in place.  Opened the delta ceramic hip ball and put it in place and reduced the hip.  Anatomic reduction was achieved.  We had done a  stability test with the trials in place and felt good about that.  At this point, the wound was irrigated, suctioned dry.  Capsule was closed with 1 Vicryl running, and the tensor fascia was closed with 0 Vicryl running, the skin then with 0 and 2-0  Vicryl and skin staples.  A sterile compressive dressing was applied, and the  patient was taken to recovery.  She was noted to be in satisfactory condition.  Estimated blood loss for procedure was 500 mL.  Of note, multiple fluoroscopic images were taken  and interpreted by me.  Gaspar Skeeters assisted throughout the case and was critical in his performance and was present for the entire case.  LN/NUANCE  D:09/22/2018 T:09/22/2018 JOB:[id number]

## 2018-09-23 DIAGNOSIS — Z8249 Family history of ischemic heart disease and other diseases of the circulatory system: Secondary | ICD-10-CM | POA: Diagnosis not present

## 2018-09-23 DIAGNOSIS — M1611 Unilateral primary osteoarthritis, right hip: Secondary | ICD-10-CM | POA: Diagnosis not present

## 2018-09-23 DIAGNOSIS — E785 Hyperlipidemia, unspecified: Secondary | ICD-10-CM | POA: Diagnosis not present

## 2018-09-23 DIAGNOSIS — R7303 Prediabetes: Secondary | ICD-10-CM | POA: Diagnosis not present

## 2018-09-23 DIAGNOSIS — H409 Unspecified glaucoma: Secondary | ICD-10-CM | POA: Diagnosis not present

## 2018-09-23 LAB — CBC
HCT: 39.2 % (ref 36.0–46.0)
Hemoglobin: 12.3 g/dL (ref 12.0–15.0)
MCH: 29.6 pg (ref 26.0–34.0)
MCHC: 31.4 g/dL (ref 30.0–36.0)
MCV: 94.2 fL (ref 80.0–100.0)
Platelets: 226 10*3/uL (ref 150–400)
RBC: 4.16 MIL/uL (ref 3.87–5.11)
RDW: 13.6 % (ref 11.5–15.5)
WBC: 15.2 10*3/uL — ABNORMAL HIGH (ref 4.0–10.5)
nRBC: 0 % (ref 0.0–0.2)

## 2018-09-23 NOTE — Discharge Summary (Signed)
Patient ID: Morgan Huff MRN: [id number] DOB/AGE: 73-Dec-1947 73 y.o.  Admit date: 09/22/2018 Discharge date: 09/23/2018  Admission Diagnoses:  Principal Problem:   Primary osteoarthritis of right hip   Discharge Diagnoses:  Same  Past Medical History:  Diagnosis Date  . Arthritis   . Colon polyps   . Diverticulosis of colon   . Dysrhythmia    palpitations recently  . Fasting hyperglycemia   . Glaucoma    Dr Edilia Bo  . Heart murmur   . Hyperlipidemia    LDL goal = < 100; Lp(a) 112; hyperabsorber  . Pre-diabetes    per patient    Surgeries: Procedure(s): TOTAL HIP ARTHROPLASTY ANTERIOR APPROACH on 09/22/2018   Consultants:   Discharged Condition: Improved  Hospital Course: Morgan Huff is an 73 y.o. female who was admitted 09/22/2018 for operative treatment ofPrimary osteoarthritis of right hip. Patient has severe unremitting pain that affects sleep, daily activities, and work/hobbies. After pre-op clearance the patient was taken to the operating room on 09/22/2018 and underwent  Procedure(s): TOTAL HIP ARTHROPLASTY ANTERIOR APPROACH.    Patient was given perioperative antibiotics:  Anti-infectives (From admission, onward)   Start     Dose/Rate Route Frequency Ordered Stop   09/22/18 1600  ceFAZolin (ANCEF) IVPB 2g/100 mL premix     2 g 200 mL/hr over 30 Minutes Intravenous Every 6 hours 09/22/18 1323 09/22/18 2329   09/22/18 0600  ceFAZolin (ANCEF) 3 g in dextrose 5 % 50 mL IVPB     3 g 100 mL/hr over 30 Minutes Intravenous On call to O.R. 09/21/18 1027 09/22/18 0955       Patient was given sequential compression devices, early ambulation, and chemoprophylaxis to prevent DVT.  Patient benefited maximally from hospital stay and there were no complications.    Recent vital signs:  Patient Vitals for the past 24 hrs:  BP Temp Temp src Pulse Resp SpO2  09/23/18 0456 127/68 97.6 F (36.4 C) Oral 60 14 98 %  09/23/18 0146 118/76 98.2 F (36.8 C) Oral 62 14 98  %  09/22/18 2135 119/70 98 F (36.7 C) Oral 73 16 96 %  09/22/18 1820 134/84 (!) 97.4 F (36.3 C) Oral 66 16 98 %  09/22/18 1620 132/71 97.7 F (36.5 C) Oral 78 16 99 %  09/22/18 1523 (!) 138/92 97.6 F (36.4 C) Oral 83 16 95 %  09/22/18 1426 133/90 (!) 97.3 F (36.3 C) Oral 73 20 97 %  09/22/18 1326 (!) 141/81 97.6 F (36.4 C) Oral (!) 56 18 95 %  09/22/18 1300 124/74 97.6 F (36.4 C) - 78 10 94 %  09/22/18 1245 125/80 - - 83 (!) 21 95 %  09/22/18 1230 136/89 - - 84 20 94 %  09/22/18 1215 127/85 - - 84 16 95 %  09/22/18 1205 120/73 97.7 F (36.5 C) - 88 19 96 %     Recent laboratory studies:  Recent Labs    09/23/18 0357  WBC 15.2*  HGB 12.3  HCT 39.2  PLT 226     Discharge Medications:   Allergies as of 09/23/2018      Reactions   Latex Rash   Red rash around red bandaid      Medication List    STOP taking these medications   ibuprofen 400 MG tablet Commonly known as:  ADVIL,MOTRIN     TAKE these medications   aspirin EC 325 MG tablet Take 1 tablet (325 mg total) by mouth 2 (two)  times daily after a meal. Take x 1 month post op to decrease risk of blood clots.   Biotin 5 MG Tabs Take 5,000 mg by mouth daily. Actually 5000 mcg -1 capsule daily   calcium carbonate 500 MG chewable tablet Commonly known as:  TUMS - dosed in mg elemental calcium Chew 2 tablets by mouth daily.   CENTRUM SILVER PO Take 1 tablet by mouth daily.   cholecalciferol 1000 units tablet Commonly known as:  VITAMIN D Take 2 tablets (2,000 Units total) by mouth daily.   docusate sodium 100 MG capsule Commonly known as:  COLACE Take 1 capsule (100 mg total) by mouth 2 (two) times daily.   NON FORMULARY Take 1 Dose by mouth 2 (two) times daily. Hemp Oil   oxyCODONE-acetaminophen 5-325 MG tablet Commonly known as:  PERCOCET/ROXICET Take 1-2 tablets by mouth every 6 (six) hours as needed for severe pain.   simvastatin 10 MG tablet Commonly known as:  ZOCOR Take 1 tablet (10  mg total) by mouth daily. What changed:  when to take this   SYSTANE 0.4-0.3 % Soln Generic drug:  Polyethyl Glycol-Propyl Glycol Place 1-2 drops into both eyes 3 (three) times daily as needed (for dry eyes).   timolol 0.5 % ophthalmic solution Commonly known as:  BETIMOL Place 1 drop into the left eye 2 (two) times daily.   tiZANidine 2 MG tablet Commonly known as:  ZANAFLEX Take 1 tablet (2 mg total) by mouth every 8 (eight) hours as needed for muscle spasms.   TURMERIC PO Take 15 mL/oz by mouth daily. liquid   vitamin C 1000 MG tablet Take 2,000 mg by mouth daily.       Diagnostic Studies: Dg Chest 2 View  Result Date: 09/20/2018 CLINICAL DATA:  Pre-op Hx of heart murmur EXAM: CHEST - 2 VIEW COMPARISON:  None. FINDINGS: Heart size is normal. The lungs are clear. No pulmonary edema. Surgical clips are present in the UPPER abdomen. Superior endplate fracture of L2 appears chronic. IMPRESSION: No evidence for acute cardiopulmonary abnormality. Chronic L2 compression fracture. Electronically Signed   By: Nolon Nations M.D.   On: 09/20/2018 08:39   Dg C-arm 1-60 Min-no Report  Result Date: 09/22/2018 Fluoroscopy was utilized by the requesting physician.  No radiographic interpretation.   Dg Hip Operative Unilat W Or W/o Pelvis Right  Result Date: 09/22/2018 CLINICAL DATA:  Intraoperative imaging for right hip replacement. EXAM: OPERATIVE RIGHT HIP (WITH PELVIS IF PERFORMED) 2 VIEWS TECHNIQUE: Fluoroscopic spot image(s) were submitted for interpretation post-operatively. COMPARISON:  Plain films right hip 04/12/2016. FINDINGS: Two fluoroscopic intraoperative spot images are provided. Right total hip arthroplasty is in place. No acute finding. IMPRESSION: Intraoperative imaging for right hip replacement. No acute abnormality. Electronically Signed   By: Inge Rise M.D.   On: 09/22/2018 11:40    Disposition: Discharge disposition: 01-Home or Self Care       Discharge  Instructions    Call MD / Call 911   Complete by:  As directed    If you experience chest pain or shortness of breath, CALL 911 and be transported to the hospital emergency room.  If you develope a fever above 101 F, pus (white drainage) or increased drainage or redness at the wound, or calf pain, call your surgeon's office.   Constipation Prevention   Complete by:  As directed    Drink plenty of fluids.  Prune juice may be helpful.  You may use a stool softener, such as  Colace (over the counter) 100 mg twice a day.  Use MiraLax (over the counter) for constipation as needed.   Diet - low sodium heart healthy   Complete by:  As directed    Discharge instructions   Complete by:  As directed    INSTRUCTIONS AFTER JOINT REPLACEMENT   Remove items at home which could result in a fall. This includes throw rugs or furniture in walking pathways ICE to the affected joint every three hours while awake for 30 minutes at a time, for at least the first 3-5 days, and then as needed for pain and swelling.  Continue to use ice for pain and swelling. You may notice swelling that will progress down to the foot and ankle.  This is normal after surgery.  Elevate your leg when you are not up walking on it.   Continue to use the breathing machine you got in the hospital (incentive spirometer) which will help keep your temperature down.  It is common for your temperature to cycle up and down following surgery, especially at night when you are not up moving around and exerting yourself.  The breathing machine keeps your lungs expanded and your temperature down.   DIET:  As you were doing prior to hospitalization, we recommend a well-balanced diet.  DRESSING / WOUND CARE / SHOWERING  You may shower 3 days after surgery, but keep the wounds dry during showering.  You may use an occlusive plastic wrap (Press'n Seal for example), NO SOAKING/SUBMERGING IN THE BATHTUB.  If the bandage gets wet, change with a clean dry  gauze.  If the incision gets wet, pat the wound dry with a clean towel.  ACTIVITY  Increase activity slowly as tolerated, but follow the weight bearing instructions below.   No driving for 6 weeks or until further direction given by your physician.  You cannot drive while taking narcotics.  No lifting or carrying greater than 10 lbs. until further directed by your surgeon. Avoid periods of inactivity such as sitting longer than an hour when not asleep. This helps prevent blood clots.  You may return to work once you are authorized by your doctor.     WEIGHT BEARING   Weight bearing as tolerated with assist device (walker, cane, etc) as directed, use it as long as suggested by your surgeon or therapist, typically at least 4-6 weeks.   EXERCISES  Results after joint replacement surgery are often greatly improved when you follow the exercise, range of motion and muscle strengthening exercises prescribed by your doctor. Safety measures are also important to protect the joint from further injury. Any time any of these exercises cause you to have increased pain or swelling, decrease what you are doing until you are comfortable again and then slowly increase them. If you have problems or questions, call your caregiver or physical therapist for advice.   Rehabilitation is important following a joint replacement. After just a few days of immobilization, the muscles of the leg can become weakened and shrink (atrophy).  These exercises are designed to build up the tone and strength of the thigh and leg muscles and to improve motion. Often times heat used for twenty to thirty minutes before working out will loosen up your tissues and help with improving the range of motion but do not use heat for the first two weeks following surgery (sometimes heat can increase post-operative swelling).   These exercises can be done on a training (exercise) mat, on the floor, on  a table or on a bed. Use whatever works  the best and is most comfortable for you.    Use music or television while you are exercising so that the exercises are a pleasant break in your day. This will make your life better with the exercises acting as a break in your routine that you can look forward to.   Perform all exercises about fifteen times, three times per day or as directed.  You should exercise both the operative leg and the other leg as well.   Exercises include:   Quad Sets - Tighten up the muscle on the front of the thigh (Quad) and hold for 5-10 seconds.   Straight Leg Raises - With your knee straight (if you were given a brace, keep it on), lift the leg to 60 degrees, hold for 3 seconds, and slowly lower the leg.  Perform this exercise against resistance later as your leg gets stronger.  Leg Slides: Lying on your back, slowly slide your foot toward your buttocks, bending your knee up off the floor (only go as far as is comfortable). Then slowly slide your foot back down until your leg is flat on the floor again.  Angel Wings: Lying on your back spread your legs to the side as far apart as you can without causing discomfort.  Hamstring Strength:  Lying on your back, push your heel against the floor with your leg straight by tightening up the muscles of your buttocks.  Repeat, but this time bend your knee to a comfortable angle, and push your heel against the floor.  You may put a pillow under the heel to make it more comfortable if necessary.   A rehabilitation program following joint replacement surgery can speed recovery and prevent re-injury in the future due to weakened muscles. Contact your doctor or a physical therapist for more information on knee rehabilitation.    CONSTIPATION  Constipation is defined medically as fewer than three stools per week and severe constipation as less than one stool per week.  Even if you have a regular bowel pattern at home, your normal regimen is likely to be disrupted due to multiple  reasons following surgery.  Combination of anesthesia, postoperative narcotics, change in appetite and fluid intake all can affect your bowels.   YOU MUST use at least one of the following options; they are listed in order of increasing strength to get the job done.  They are all available over the counter, and you may need to use some, POSSIBLY even all of these options:    Drink plenty of fluids (prune juice may be helpful) and high fiber foods Colace 100 mg by mouth twice a day  Senokot for constipation as directed and as needed Dulcolax (bisacodyl), take with full glass of water  Miralax (polyethylene glycol) once or twice a day as needed.  If you have tried all these things and are unable to have a bowel movement in the first 3-4 days after surgery call either your surgeon or your primary doctor.    If you experience loose stools or diarrhea, hold the medications until you stool forms back up.  If your symptoms do not get better within 1 week or if they get worse, check with your doctor.  If you experience "the worst abdominal pain ever" or develop nausea or vomiting, please contact the office immediately for further recommendations for treatment.   ITCHING:  If you experience itching with your medications, try taking only a  single pain pill, or even half a pain pill at a time.  You can also use Benadryl over the counter for itching or also to help with sleep.   TED HOSE STOCKINGS:  Use stockings on both legs until for at least 2 weeks or as directed by physician office. They may be removed at night for sleeping.  MEDICATIONS:  See your medication summary on the "After Visit Summary" that nursing will review with you.  You may have some home medications which will be placed on hold until you complete the course of blood thinner medication.  It is important for you to complete the blood thinner medication as prescribed.  PRECAUTIONS:  If you experience chest pain or shortness of breath - call  911 immediately for transfer to the hospital emergency department.   If you develop a fever greater that 101 F, purulent drainage from wound, increased redness or drainage from wound, foul odor from the wound/dressing, or calf pain - CONTACT YOUR SURGEON.                                                   FOLLOW-UP APPOINTMENTS:  If you do not already have a post-op appointment, please call the office for an appointment to be seen by your surgeon.  Guidelines for how soon to be seen are listed in your "After Visit Summary", but are typically between 1-4 weeks after surgery.  OTHER INSTRUCTIONS:   Knee Replacement:  Do not place pillow under knee, focus on keeping the knee straight while resting. CPM instructions: 0-90 degrees, 2 hours in the morning, 2 hours in the afternoon, and 2 hours in the evening. Place foam block, curve side up under heel at all times except when in CPM or when walking.  DO NOT modify, tear, cut, or change the foam block in any way.  MAKE SURE YOU:  Understand these instructions.  Get help right away if you are not doing well or get worse.    Thank you for letting us be a part of your medical care team.  It is a privilege we respect greatly.  We hope these instructions will help you stay on track for a fast and full recovery!   Increase activity slowly as tolerated   Complete by:  As directed       Follow-up Information    Dorna Leitz, MD. Go on 10/09/2018.   Specialty:  Orthopedic Surgery Why:  Your appointment has been scheduled for 10:00.  Contact information: [place] [address] [phone]        [facility], [place]. Go on 09/26/2018.   Why:  You are scheduled to start Outpatient physical therapy at 10:40 with Mitzi Hansen. Please arrive at 10:20 to complete your paperwork.  Contact information: [place] [address] [phone]            Signed: Larwance Sachs  Shamyia Grandpre 09/23/2018, 9:00 AM

## 2018-09-23 NOTE — Progress Notes (Signed)
Physical Therapy Treatment Patient Details Name: Morgan Huff MRN: [id number] DOB: [birth year] Today's Date: 09/23/2018    History of Present Illness 73 yo female s/p R DA-THA on 09/22/18. PMH includes hip pain, diverticulosis, bilateral LE weakness, glaucoma, pre-DM.     PT Comments    Patient progressing with ambulation and able to practice bed mobility and given HEP.  She has family support Mickel Baas, her sister who was ICU RN here,) and spouse.  Feel she is stable for home with follow up PT as planned per MD.   Follow Up Recommendations  Follow surgeon's recommendation for DC plan and follow-up therapies;Supervision for mobility/OOB     Equipment Recommendations  Rolling walker with 5" wheels    Recommendations for Other Services       Precautions / Restrictions Precautions Precautions: Fall Restrictions Other Position/Activity Restrictions: WBAT     Mobility  Bed Mobility Overal bed mobility: Needs Assistance Bed Mobility: Supine to Sit;Sit to Supine     Supine to sit: Min assist Sit to supine: Min assist   General bed mobility comments: placed height of bed up to simulate her bed at home, cues for scooting back assist for R LE into bed and for trunk up to sit with cues for using momentum  Transfers Overall transfer level: Needs assistance Equipment used: Rolling walker (2 wheeled) Transfers: Sit to/from Stand Sit to Stand: Supervision;Min guard         General transfer comment: increased time due to stiff from recliner, no physical help up from 3:1 over toilet in bathroom  Ambulation/Gait Ambulation/Gait assistance: Supervision;Min guard Gait Distance (Feet): 150 Feet Assistive device: Rolling walker (2 wheeled) Gait Pattern/deviations: Step-through pattern;Decreased stride length;Antalgic     General Gait Details: minguard for balance initially, then without device   Stairs             Wheelchair Mobility    Modified Rankin (Stroke  Patients Only)       Balance Overall balance assessment: Mild deficits observed, not formally tested                                          Cognition Arousal/Alertness: Awake/alert Behavior During Therapy: WFL for tasks assessed/performed Overall Cognitive Status: Within Functional Limits for tasks assessed                                        Exercises Total Joint Exercises Ankle Circles/Pumps: AROM;10 reps;Both;Supine Quad Sets: AROM;10 reps;Both;Supine Short Arc Quad: AROM;10 reps;Right;Supine Heel Slides: AROM;10 reps;Supine;Left Hip ABduction/ADduction: AAROM;10 reps;Right;Supine(with strap)    General Comments General comments (skin integrity, edema, etc.): issued HEP and educated on frequency and repititions as well as on walking distance and frequency.  For possibly outpatient PT after follow up MD visit; encouraged to continue walker use until seen by MD and for gradual progression of activity      Pertinent Vitals/Pain Pain Score: 4  Pain Location: R hip  Pain Descriptors / Indicators: Aching Pain Intervention(s): Monitored during session;Repositioned;Ice applied    Home Living                      Prior Function            PT Goals (current goals can now be found in the  care plan section) Progress towards PT goals: Progressing toward goals    Frequency    7X/week      PT Plan Current plan remains appropriate    Co-evaluation              AM-PAC PT "6 Clicks" Mobility   Outcome Measure  Help needed turning from your back to your side while in a flat bed without using bedrails?: A Little Help needed moving from lying on your back to sitting on the side of a flat bed without using bedrails?: A Little Help needed moving to and from a bed to a chair (including a wheelchair)?: None Help needed standing up from a chair using your arms (e.g., wheelchair or bedside chair)?: None Help needed to walk in  hospital room?: A Little Help needed climbing 3-5 steps with a railing? : A Little 6 Click Score: 20    End of Session   Activity Tolerance: Patient tolerated treatment well Patient left: in chair;with call bell/phone within reach;with family/visitor present   PT Visit Diagnosis: Other abnormalities of gait and mobility (R26.89);Difficulty in walking, not elsewhere classified (R26.2)     Time: 2641-5830 PT Time Calculation (min) (ACUTE ONLY): 34 min  Charges:  $Gait Training: 8-22 mins $Therapeutic Exercise: 8-22 mins                     Magda Kiel, [place] [facility] [phone] 09/23/2018    Reginia Naas 09/23/2018, 10:59 AM

## 2018-09-23 NOTE — Progress Notes (Signed)
AVS reviewed with patient and sister,Laura. Educated on new prescribed medications usage and directions. IV removed form left hand. Patient questions/concerns answered. Will discharge to home with sister after lunch.

## 2018-09-23 NOTE — Care Management Note (Signed)
Case Management Note  Patient Details  Name: EMONII WIENKE MRN: [id number] Date of Birth: 1946-03-28  Subjective/Objective:   Pt is s/p R DA THA                 Action/Plan: Spoke to pt and sister at bedside. Sister will be at home to assist with care. Scheduled for OPPT. Contacted [facility] for RW. [facility] delivered RW to room.   Expected Discharge Date:  09/23/18               Expected Discharge Plan:  OP Rehab  In-House Referral:  NA  Discharge planning Services  CM Consult  Post Acute Care Choice:  NA Choice offered to:  NA  DME Arranged:  Gilford Rile tall DME Agency:  [employer]  HH Arranged:  NA HH Agency:  NA  Status of Service:  Completed, signed off  If discussed at [place] of Stay Meetings, dates discussed:    Additional Comments:  Erenest Rasher, RN 09/23/2018, 1:10 PM

## 2018-09-25 ENCOUNTER — Telehealth: Payer: Self-pay | Admitting: *Deleted

## 2018-09-25 NOTE — Telephone Encounter (Signed)
Pt was on TCM report admitted was 09/22/2018 for operative treatment of Primary osteoarthritis of right hip. After pre-op clearance the patient was taken to the operating room and underwent  Procedure(s): TOTAL HIP ARTHROPLASTY ANTERIOR APPROACH.  Pt D/C 09/23/18 and will follow-up w/ Dr. Berenice Primas.Marland KitchenJohny Chess

## 2018-09-26 DIAGNOSIS — M25651 Stiffness of right hip, not elsewhere classified: Secondary | ICD-10-CM | POA: Diagnosis not present

## 2018-09-26 DIAGNOSIS — Z96641 Presence of right artificial hip joint: Secondary | ICD-10-CM | POA: Diagnosis not present

## 2018-09-26 DIAGNOSIS — M25551 Pain in right hip: Secondary | ICD-10-CM | POA: Diagnosis not present

## 2018-09-28 DIAGNOSIS — Z9889 Other specified postprocedural states: Secondary | ICD-10-CM | POA: Diagnosis not present

## 2018-09-28 DIAGNOSIS — Z96641 Presence of right artificial hip joint: Secondary | ICD-10-CM | POA: Diagnosis not present

## 2018-09-28 DIAGNOSIS — M25651 Stiffness of right hip, not elsewhere classified: Secondary | ICD-10-CM | POA: Diagnosis not present

## 2018-09-28 DIAGNOSIS — M25551 Pain in right hip: Secondary | ICD-10-CM | POA: Diagnosis not present

## 2018-09-29 ENCOUNTER — Encounter (HOSPITAL_COMMUNITY): Payer: Self-pay | Admitting: Orthopedic Surgery

## 2018-10-03 DIAGNOSIS — M25651 Stiffness of right hip, not elsewhere classified: Secondary | ICD-10-CM | POA: Diagnosis not present

## 2018-10-03 DIAGNOSIS — M25551 Pain in right hip: Secondary | ICD-10-CM | POA: Diagnosis not present

## 2018-10-03 DIAGNOSIS — Z96641 Presence of right artificial hip joint: Secondary | ICD-10-CM | POA: Diagnosis not present

## 2018-10-05 DIAGNOSIS — Z96641 Presence of right artificial hip joint: Secondary | ICD-10-CM | POA: Diagnosis not present

## 2018-10-05 DIAGNOSIS — M25651 Stiffness of right hip, not elsewhere classified: Secondary | ICD-10-CM | POA: Diagnosis not present

## 2018-10-05 DIAGNOSIS — M25551 Pain in right hip: Secondary | ICD-10-CM | POA: Diagnosis not present

## 2018-10-09 DIAGNOSIS — H401331 Pigmentary glaucoma, bilateral, mild stage: Secondary | ICD-10-CM | POA: Diagnosis not present

## 2018-10-09 DIAGNOSIS — Z471 Aftercare following joint replacement surgery: Secondary | ICD-10-CM | POA: Diagnosis not present

## 2018-10-10 DIAGNOSIS — M25651 Stiffness of right hip, not elsewhere classified: Secondary | ICD-10-CM | POA: Diagnosis not present

## 2018-10-10 DIAGNOSIS — M25551 Pain in right hip: Secondary | ICD-10-CM | POA: Diagnosis not present

## 2018-10-10 DIAGNOSIS — Z96641 Presence of right artificial hip joint: Secondary | ICD-10-CM | POA: Diagnosis not present

## 2018-10-12 DIAGNOSIS — M25551 Pain in right hip: Secondary | ICD-10-CM | POA: Diagnosis not present

## 2018-10-12 DIAGNOSIS — M25651 Stiffness of right hip, not elsewhere classified: Secondary | ICD-10-CM | POA: Diagnosis not present

## 2018-10-12 DIAGNOSIS — Z9889 Other specified postprocedural states: Secondary | ICD-10-CM | POA: Diagnosis not present

## 2018-10-12 DIAGNOSIS — Z96641 Presence of right artificial hip joint: Secondary | ICD-10-CM | POA: Diagnosis not present

## 2018-10-17 DIAGNOSIS — M25551 Pain in right hip: Secondary | ICD-10-CM | POA: Diagnosis not present

## 2018-10-17 DIAGNOSIS — M25651 Stiffness of right hip, not elsewhere classified: Secondary | ICD-10-CM | POA: Diagnosis not present

## 2018-10-17 DIAGNOSIS — Z96641 Presence of right artificial hip joint: Secondary | ICD-10-CM | POA: Diagnosis not present

## 2018-10-19 DIAGNOSIS — M25551 Pain in right hip: Secondary | ICD-10-CM | POA: Diagnosis not present

## 2018-10-19 DIAGNOSIS — M25651 Stiffness of right hip, not elsewhere classified: Secondary | ICD-10-CM | POA: Diagnosis not present

## 2018-10-19 DIAGNOSIS — Z96641 Presence of right artificial hip joint: Secondary | ICD-10-CM | POA: Diagnosis not present

## 2018-10-19 DIAGNOSIS — Z9889 Other specified postprocedural states: Secondary | ICD-10-CM | POA: Diagnosis not present

## 2018-11-13 ENCOUNTER — Ambulatory Visit: Payer: Medicare Other | Admitting: Internal Medicine

## 2018-11-20 DIAGNOSIS — Z96641 Presence of right artificial hip joint: Secondary | ICD-10-CM | POA: Diagnosis not present

## 2018-11-20 DIAGNOSIS — Z9889 Other specified postprocedural states: Secondary | ICD-10-CM | POA: Diagnosis not present

## 2018-12-04 ENCOUNTER — Other Ambulatory Visit: Payer: Self-pay | Admitting: Internal Medicine

## 2018-12-04 MED ORDER — CEFUROXIME AXETIL 250 MG PO TABS: 250.0000 mg | ORAL_TABLET | Freq: Two times a day (BID) | ORAL | 0 refills | Status: AC

## 2018-12-08 ENCOUNTER — Telehealth: Payer: Self-pay | Admitting: Internal Medicine

## 2018-12-08 NOTE — Telephone Encounter (Signed)
Message sent to patient today via my-chart. 

## 2018-12-08 NOTE — Telephone Encounter (Signed)
5 days is adequate for most UTI so should just take 5 days

## 2018-12-08 NOTE — Telephone Encounter (Signed)
Spoke with patient and info given. She has been advised to finish out antibiotic and if not any better she can call on Monday for appointment.

## 2018-12-08 NOTE — Telephone Encounter (Signed)
Copied from [place] [id number]. Topic: Quick Communication - Rx Refill/Question >> Dec 08, 2018  9:59 AM Rainey Pines A wrote: Medication: cefUROXime (CEFTIN) 250 MG tablet (Patient was instructed to take medication for 7 days , twice a day and the prescription was only written for 10 tablets. Patient needs the correct quantity sent over to pharmacy.)  Has the patient contacted their pharmacy? Yes (Agent: If no, request that the patient contact the pharmacy for the refill.) (Agent: If yes, when and what did the pharmacy advise?)Contact PCP  Preferred Pharmacy (with phone number or street name): [facility] [address] [place] - [place] [phone] (Phone) [phone] (Fax)    Agent: Please be advised that RX refills may take up to 3 business days. We ask that you follow-up with your pharmacy.

## 2018-12-08 NOTE — Telephone Encounter (Signed)
Pharmacy is now calling because the quanity dose not match instructions if she takes 2 a day for 7 days.  Would like clarification.

## 2018-12-11 DIAGNOSIS — H401331 Pigmentary glaucoma, bilateral, mild stage: Secondary | ICD-10-CM | POA: Diagnosis not present

## 2018-12-13 ENCOUNTER — Other Ambulatory Visit: Payer: Self-pay

## 2018-12-13 DIAGNOSIS — R300 Dysuria: Secondary | ICD-10-CM

## 2018-12-14 ENCOUNTER — Other Ambulatory Visit (INDEPENDENT_AMBULATORY_CARE_PROVIDER_SITE_OTHER): Payer: Medicare Other

## 2018-12-14 DIAGNOSIS — R300 Dysuria: Secondary | ICD-10-CM

## 2018-12-14 LAB — URINALYSIS, ROUTINE W REFLEX MICROSCOPIC
Bilirubin Urine: NEGATIVE
Hgb urine dipstick: NEGATIVE
Ketones, ur: NEGATIVE
Nitrite: NEGATIVE
Specific Gravity, Urine: 1.02 (ref 1.000–1.030)
Total Protein, Urine: NEGATIVE
Urine Glucose: NEGATIVE
Urobilinogen, UA: 0.2 (ref 0.0–1.0)
pH: 7 (ref 5.0–8.0)

## 2018-12-16 LAB — URINE CULTURE
MICRO NUMBER:: 474782
SPECIMEN QUALITY:: ADEQUATE

## 2018-12-18 ENCOUNTER — Other Ambulatory Visit: Payer: Self-pay | Admitting: Family

## 2018-12-18 MED ORDER — CEFUROXIME AXETIL 500 MG PO TABS: 500.0000 mg | ORAL_TABLET | Freq: Two times a day (BID) | ORAL | 0 refills | Status: DC

## 2019-01-02 DIAGNOSIS — Z96641 Presence of right artificial hip joint: Secondary | ICD-10-CM | POA: Diagnosis not present

## 2019-01-02 DIAGNOSIS — M1611 Unilateral primary osteoarthritis, right hip: Secondary | ICD-10-CM | POA: Diagnosis not present

## 2019-01-08 ENCOUNTER — Other Ambulatory Visit: Payer: Self-pay | Admitting: Family

## 2019-02-21 DIAGNOSIS — D123 Benign neoplasm of transverse colon: Secondary | ICD-10-CM | POA: Diagnosis not present

## 2019-02-21 DIAGNOSIS — K5730 Diverticulosis of large intestine without perforation or abscess without bleeding: Secondary | ICD-10-CM | POA: Diagnosis not present

## 2019-02-21 DIAGNOSIS — Z8371 Family history of colonic polyps: Secondary | ICD-10-CM | POA: Diagnosis not present

## 2019-02-21 DIAGNOSIS — Z86010 Personal history of colonic polyps: Secondary | ICD-10-CM | POA: Diagnosis not present

## 2019-02-23 DIAGNOSIS — D123 Benign neoplasm of transverse colon: Secondary | ICD-10-CM | POA: Diagnosis not present

## 2019-03-19 DIAGNOSIS — Z803 Family history of malignant neoplasm of breast: Secondary | ICD-10-CM | POA: Diagnosis not present

## 2019-03-19 DIAGNOSIS — Z1231 Encounter for screening mammogram for malignant neoplasm of breast: Secondary | ICD-10-CM | POA: Diagnosis not present

## 2019-03-19 LAB — HM MAMMOGRAPHY

## 2019-03-20 ENCOUNTER — Encounter: Payer: Self-pay | Admitting: Internal Medicine

## 2019-03-20 NOTE — Progress Notes (Signed)
Abstracted and sent to scan  

## 2019-03-26 ENCOUNTER — Encounter: Payer: Self-pay | Admitting: Internal Medicine

## 2019-03-26 ENCOUNTER — Ambulatory Visit (INDEPENDENT_AMBULATORY_CARE_PROVIDER_SITE_OTHER): Payer: Medicare Other | Admitting: Internal Medicine

## 2019-03-26 ENCOUNTER — Other Ambulatory Visit (INDEPENDENT_AMBULATORY_CARE_PROVIDER_SITE_OTHER): Payer: Medicare Other

## 2019-03-26 ENCOUNTER — Other Ambulatory Visit: Payer: Self-pay

## 2019-03-26 VITALS — BP 142/98 | HR 86 | Temp 98.3°F | Ht 74.0 in | Wt 289.0 lb

## 2019-03-26 DIAGNOSIS — E785 Hyperlipidemia, unspecified: Secondary | ICD-10-CM

## 2019-03-26 DIAGNOSIS — R1031 Right lower quadrant pain: Secondary | ICD-10-CM | POA: Diagnosis not present

## 2019-03-26 DIAGNOSIS — Z23 Encounter for immunization: Secondary | ICD-10-CM | POA: Diagnosis not present

## 2019-03-26 DIAGNOSIS — M25551 Pain in right hip: Secondary | ICD-10-CM | POA: Diagnosis not present

## 2019-03-26 DIAGNOSIS — G8929 Other chronic pain: Secondary | ICD-10-CM

## 2019-03-26 DIAGNOSIS — R739 Hyperglycemia, unspecified: Secondary | ICD-10-CM | POA: Diagnosis not present

## 2019-03-26 DIAGNOSIS — R1030 Lower abdominal pain, unspecified: Secondary | ICD-10-CM | POA: Insufficient documentation

## 2019-03-26 HISTORY — DX: Lower abdominal pain, unspecified: R10.30

## 2019-03-26 LAB — HEMOGLOBIN A1C: Hgb A1c MFr Bld: 6.4 % (ref 4.6–6.5)

## 2019-03-26 NOTE — Assessment & Plan Note (Signed)
Better post-op 

## 2019-03-26 NOTE — Assessment & Plan Note (Addendum)
R - ?MSK vs other R/o hernia Gen surg ref vs CT discussed

## 2019-03-26 NOTE — Assessment & Plan Note (Signed)
A1c

## 2019-03-26 NOTE — Progress Notes (Signed)
Subjective:  Patient ID: Morgan Huff, female    DOB: 10/24/1945  Age: 73 y.o. MRN: [id number]  CC: No chief complaint on file.   HPI [place] for OA, dyslipidemia, LBP C/o R hip pain post-op C/o spot in the R groin - achy off and on  - GI checked - no hernia  Outpatient Medications Prior to Visit  Medication Sig Dispense Refill  . Ascorbic Acid (VITAMIN C) 1000 MG tablet Take 2,000 mg by mouth daily.     . Biotin 5 MG TABS Take 5,000 mg by mouth daily. Actually 5000 mcg -1 capsule daily    . calcium carbonate (TUMS - DOSED IN MG ELEMENTAL CALCIUM) 500 MG chewable tablet Chew 2 tablets by mouth daily.     . cholecalciferol (VITAMIN D) 1000 units tablet Take 2 tablets (2,000 Units total) by mouth daily. 100 tablet 3  . ibuprofen (ADVIL) 200 MG tablet Take 400 mg by mouth daily.    . Multiple Vitamins-Minerals (CENTRUM SILVER PO) Take 1 tablet by mouth daily.      Vladimir Faster Glycol-Propyl Glycol (SYSTANE) 0.4-0.3 % SOLN Place 1-2 drops into both eyes 3 (three) times daily as needed (for dry eyes).     . simvastatin (ZOCOR) 10 MG tablet Take 1 tablet (10 mg total) by mouth daily. (Patient taking differently: Take 10 mg by mouth at bedtime. ) 90 tablet 3  . timolol (BETIMOL) 0.5 % ophthalmic solution Place 1 drop into the left eye daily.     Marland Kitchen aspirin EC 325 MG tablet Take 1 tablet (325 mg total) by mouth 2 (two) times daily after a meal. Take x 1 month post op to decrease risk of blood clots. 60 tablet 0  . cefUROXime (CEFTIN) 500 MG tablet Take 1 tablet (500 mg total) by mouth 2 (two) times daily with a meal. 10 tablet 0  . docusate sodium (COLACE) 100 MG capsule Take 1 capsule (100 mg total) by mouth 2 (two) times daily. 30 capsule 0  . NON FORMULARY Take 1 Dose by mouth 2 (two) times daily. Hemp Oil    . oxyCODONE-acetaminophen (PERCOCET/ROXICET) 5-325 MG tablet Take 1-2 tablets by mouth every 6 (six) hours as needed for severe pain. 40 tablet 0  . tiZANidine  (ZANAFLEX) 2 MG tablet Take 1 tablet (2 mg total) by mouth every 8 (eight) hours as needed for muscle spasms. 40 tablet 0  . TURMERIC PO Take 15 mL/oz by mouth daily. liquid     No facility-administered medications prior to visit.     ROS: Review of Systems  Constitutional: Negative for activity change, appetite change, chills, fatigue and unexpected weight change.  HENT: Negative for congestion, mouth sores and sinus pressure.   Eyes: Negative for visual disturbance.  Respiratory: Negative for cough and chest tightness.   Gastrointestinal: Positive for abdominal pain. Negative for nausea.  Genitourinary: Negative for difficulty urinating, frequency and vaginal pain.  Musculoskeletal: Positive for arthralgias and gait problem. Negative for back pain.  Skin: Negative for pallor and rash.  Neurological: Negative for dizziness, tremors, weakness, numbness and headaches.  Psychiatric/Behavioral: Negative for confusion, sleep disturbance and suicidal ideas.    Objective:  BP (!) 142/98 (BP Location: Left Arm, Patient Position: Sitting, Cuff Size: Large)   Pulse 86   Temp 98.3 F (36.8 C) (Oral)   Ht 6\' 2"  (1.88 m)   Wt 289 lb (131.1 kg)   SpO2 96%   BMI 37.11 kg/m   BP Readings  from Last 3 Encounters:  03/26/19 (!) 142/98  09/23/18 (!) 113/59  09/19/18 (!) 146/84    Wt Readings from Last 3 Encounters:  03/26/19 289 lb (131.1 kg)  09/22/18 277 lb (125.6 kg)  09/19/18 277 lb (125.6 kg)    Physical Exam Constitutional:      General: She is not in acute distress.    Appearance: She is well-developed.  HENT:     Head: Normocephalic.     Right Ear: External ear normal.     Left Ear: External ear normal.     Nose: Nose normal.  Eyes:     General:        Right eye: No discharge.        Left eye: No discharge.     Conjunctiva/sclera: Conjunctivae normal.     Pupils: Pupils are equal, round, and reactive to light.  Neck:     Musculoskeletal: Normal range of motion and neck  supple.     Thyroid: No thyromegaly.     Vascular: No JVD.     Trachea: No tracheal deviation.  Cardiovascular:     Rate and Rhythm: Normal rate and regular rhythm.     Heart sounds: Normal heart sounds.  Pulmonary:     Effort: No respiratory distress.     Breath sounds: No stridor. No wheezing.  Abdominal:     General: Bowel sounds are normal. There is no distension.     Palpations: Abdomen is soft. There is no mass.     Tenderness: There is abdominal tenderness. There is no guarding or rebound.     Hernia: No hernia is present.  Musculoskeletal:        General: No tenderness.  Lymphadenopathy:     Cervical: No cervical adenopathy.  Skin:    Findings: No erythema or rash.  Neurological:     Cranial Nerves: No cranial nerve deficit.     Motor: No abnormal muscle tone.     Coordination: Coordination normal.     Deep Tendon Reflexes: Reflexes normal.  Psychiatric:        Behavior: Behavior normal.        Thought Content: Thought content normal.        Judgment: Judgment normal.   RLQ ?hernia - sensitive  Lab Results  Component Value Date   WBC 15.2 (H) 09/23/2018   HGB 12.3 09/23/2018   HCT 39.2 09/23/2018   PLT 226 09/23/2018   GLUCOSE 105 (H) 09/19/2018   CHOL 177 06/10/2017   TRIG 151.0 (H) 06/10/2017   HDL 52.90 06/10/2017   LDLDIRECT 85.6 02/20/2008   LDLCALC 94 06/10/2017   ALT 16 09/19/2018   AST 18 09/19/2018   NA 139 09/19/2018   K 4.0 09/19/2018   CL 104 09/19/2018   CREATININE 0.92 09/19/2018   BUN 24 (H) 09/19/2018   CO2 28 09/19/2018   TSH 2.36 07/12/2018   INR 1.00 09/19/2018   HGBA1C 6.3 07/12/2018   MICROALBUR 0.7 04/12/2016    Dg Chest 2 View  Result Date: 09/20/2018 CLINICAL DATA:  Pre-op Hx of heart murmur EXAM: CHEST - 2 VIEW COMPARISON:  None. FINDINGS: Heart size is normal. The lungs are clear. No pulmonary edema. Surgical clips are present in the UPPER abdomen. Superior endplate fracture of L2 appears chronic. IMPRESSION: No evidence  for acute cardiopulmonary abnormality. Chronic L2 compression fracture. Electronically Signed   By: Nolon Nations M.D.   On: 09/20/2018 08:39    Assessment & Plan:   There  are no diagnoses linked to this encounter.   No orders of the defined types were placed in this encounter.    Follow-up: No follow-ups on file.  Walker Kehr, MD

## 2019-03-26 NOTE — Patient Instructions (Addendum)
These suggestions will probably help you to improve your metabolism if you are not overweight and to lose weight if you are overweight: 1.  Reduce your consumption of sugars and starches.  Eliminate high fructose corn syrup from your diet.  Reduce your consumption of processed foods.  For desserts try to have seasonal fruits, berries, nuts, cheeses or dark chocolate with more than 70% cacao. 2.  Do not snack 3.  You do not have to eat breakfast.  If you choose to have breakfast-eat plain greek yogurt, eggs, oatmeal (without sugar) 4.  Drink water, freshly brewed unsweetened tea (green, black or herbal) or coffee.  Do not drink sodas including diet sodas , juices, beverages sweetened with artificial sweeteners. 5.  Reduce your consumption of refined grains. 6.  Avoid protein drinks such as Optifast, Slim fast etc. Eat chicken, fish, meat, dairy and beans for your sources of protein 7.  Natural unprocessed fats like cold pressed virgin olive oil, butter, coconut oil are good for you.  Eat avocados 8.  Increase your consumption of fiber.  Fruits, berries, vegetables, whole grains, flaxseeds, Chia seeds, beans, popcorn, nuts, oatmeal are good sources of fiber 9.  Use vinegar in your diet, i.e. apple cider vinegar, red wine or balsamic vinegar 10.  You can try fasting.  For example you can skip breakfast and lunch every other day (24-hour fast) 11.  Stress reduction, good night sleep, relaxation, meditation, yoga and other physical activity is likely to help you to maintain low weight too. 12.  If you drink alcohol, limit your alcohol intake to no more than 2 drinks a day.   Mediterranean diet is good for you. ([employer] has a typical Mediterranean cuisine menu) The Mediterranean diet is a way of eating based on the traditional cuisine of countries bordering the [place] Sea. While there is no single definition of the Mediterranean diet, it is typically high in vegetables, fruits, whole grains,  beans, nut and seeds, and olive oil. The main components of Mediterranean diet include: . Daily consumption of vegetables, fruits, whole grains and healthy fats  . Weekly intake of fish, poultry, beans and eggs  . Moderate portions of dairy products  . Limited intake of red meat Other important elements of the Mediterranean diet are sharing meals with family and friends, enjoying a glass of red wine and being physically active. Health benefits of a Mediterranean diet: A traditional Mediterranean diet consisting of large quantities of fresh fruits and vegetables, nuts, fish and olive oil-coupled with physical activity-can reduce your risk of serious mental and physical health problems by: Preventing heart disease and strokes. Following a Mediterranean diet limits your intake of refined breads, processed foods, and red meat, and encourages drinking red wine instead of hard liquor-all factors that can help prevent heart disease and stroke. Keeping you agile. If you're an older adult, the nutrients gained with a Mediterranean diet may reduce your risk of developing muscle weakness and other signs of frailty by about 70 percent. Reducing the risk of Alzheimer's. Research suggests that the Mediterranean diet may improve cholesterol, blood sugar levels, and overall blood vessel health, which in turn may reduce your risk of Alzheimer's disease or dementia. Halving the risk of Parkinson's disease. The high levels of antioxidants in the Mediterranean diet can prevent cells from undergoing a damaging process called oxidative stress, thereby cutting the risk of Parkinson's disease in half. Increasing longevity. By reducing your risk of developing heart disease or cancer with the Mediterranean diet,   you're reducing your risk of death at any age by 20%. Protecting against type 2 diabetes. A Mediterranean diet is rich in fiber which digests slowly, prevents huge swings in blood sugar, and can help you maintain a  healthy weight.    Cabbage soup recipe that will not make you gain weight: Take 1 small head of cabbage, 1 average pack of celery, 4 green peppers, 4 onions, 2 cans diced tomatoes (they are not available without salt), salt and spices to taste.  Chop cabbage, celery, peppers and onions.  And tomatoes and 2-2.5 liters (2.5 quarts) of water so that it would just cover the vegetables.  Bring to boil.  Add spices and salt.  Turn heat to low/medium and simmer for 20-25 minutes.  Naturally, you can make a smaller batch and change some of the ingredients.    Cardiac CT calcium scoring test $150   Computed tomography, more commonly known as a CT or CAT scan, is a diagnostic medical imaging test. Like traditional x-rays, it produces multiple images or pictures of the inside of the body. The cross-sectional images generated during a CT scan can be reformatted in multiple planes. They can even generate three-dimensional images. These images can be viewed on a computer monitor, printed on film or by a 3D printer, or transferred to a CD or DVD. CT images of internal organs, bones, soft tissue and blood vessels provide greater detail than traditional x-rays, particularly of soft tissues and blood vessels. A cardiac CT scan for coronary calcium is a non-invasive way of obtaining information about the presence, location and extent of calcified plaque in the coronary arteries-the vessels that supply oxygen-containing blood to the heart muscle. Calcified plaque results when there is a build-up of fat and other substances under the inner layer of the artery. This material can calcify which signals the presence of atherosclerosis, a disease of the vessel wall, also called coronary artery disease (CAD). People with this disease have an increased risk for heart attacks. In addition, over time, progression of plaque build up (CAD) can narrow the arteries or even close off blood flow to the heart. The result may be chest  pain, sometimes called "angina," or a heart attack. Because calcium is a marker of CAD, the amount of calcium detected on a cardiac CT scan is a helpful prognostic tool. The findings on cardiac CT are expressed as a calcium score. Another name for this test is coronary artery calcium scoring.  What are some common uses of the procedure? The goal of cardiac CT scan for calcium scoring is to determine if CAD is present and to what extent, even if there are no symptoms. It is a screening study that may be recommended by a physician for patients with risk factors for CAD but no clinical symptoms. The major risk factors for CAD are: . high blood cholesterol levels  . family history of heart attacks  . diabetes  . high blood pressure  . cigarette smoking  . overweight or obese  . physical inactivity   A negative cardiac CT scan for calcium scoring shows no calcification within the coronary arteries. This suggests that CAD is absent or so minimal it cannot be seen by this technique. The chance of having a heart attack over the next two to five years is very low under these circumstances. A positive test means that CAD is present, regardless of whether or not the patient is experiencing any symptoms. The amount of calcification-expressed as the calcium   score-may help to predict the likelihood of a myocardial infarction (heart attack) in the coming years and helps your medical doctor or cardiologist decide whether the patient may need to take preventive medicine or undertake other measures such as diet and exercise to lower the risk for heart attack. The extent of CAD is graded according to your calcium score:  Calcium Score  Presence of CAD (coronary artery disease)  0 No evidence of CAD   1-10 Minimal evidence of CAD  11-100 Mild evidence of CAD  101-400 Moderate evidence of CAD  Over 400 Extensive evidence of CAD      If you have medicare related insurance (such as traditional Medicare, Blue  Cross Medicare, United HealthCare Medicare, or similar), Please make an appointment at the scheduling desk with Jill, the Wellness Health Coach, for your Wellness visit in this office, which is a benefit with your insurance.   

## 2019-03-26 NOTE — Assessment & Plan Note (Signed)
cardiac CT scan for calcium scoring offered again

## 2019-03-27 LAB — BASIC METABOLIC PANEL
BUN: 20 mg/dL (ref 6–23)
CO2: 30 mEq/L (ref 19–32)
Calcium: 10.5 mg/dL (ref 8.4–10.5)
Chloride: 104 mEq/L (ref 96–112)
Creatinine, Ser: 0.86 mg/dL (ref 0.40–1.20)
GFR: 64.61 mL/min (ref >60.00)
Glucose, Bld: 98 mg/dL (ref 70–99)
Potassium: 4.4 mEq/L (ref 3.5–5.1)
Sodium: 140 mEq/L (ref 135–145)

## 2019-03-27 LAB — HEPATIC FUNCTION PANEL
ALT: 17 U/L (ref 0–35)
AST: 25 U/L (ref 0–37)
Albumin: 4.5 g/dL (ref 3.5–5.2)
Alkaline Phosphatase: 79 U/L (ref 39–117)
Bilirubin, Direct: 0.1 mg/dL (ref 0.0–0.3)
Total Bilirubin: 0.5 mg/dL (ref 0.2–1.2)
Total Protein: 7.4 g/dL (ref 6.0–8.3)

## 2019-03-27 LAB — TSH: TSH: 2.37 u[IU]/mL (ref 0.35–4.50)

## 2019-03-29 DIAGNOSIS — H401331 Pigmentary glaucoma, bilateral, mild stage: Secondary | ICD-10-CM | POA: Diagnosis not present

## 2019-04-05 DIAGNOSIS — M1611 Unilateral primary osteoarthritis, right hip: Secondary | ICD-10-CM | POA: Diagnosis not present

## 2019-04-16 DIAGNOSIS — H401331 Pigmentary glaucoma, bilateral, mild stage: Secondary | ICD-10-CM | POA: Diagnosis not present

## 2019-04-30 DIAGNOSIS — M1990 Unspecified osteoarthritis, unspecified site: Secondary | ICD-10-CM | POA: Insufficient documentation

## 2019-04-30 DIAGNOSIS — R1031 Right lower quadrant pain: Secondary | ICD-10-CM | POA: Diagnosis not present

## 2019-04-30 DIAGNOSIS — Z6838 Body mass index (BMI) 38.0-38.9, adult: Secondary | ICD-10-CM | POA: Diagnosis not present

## 2019-04-30 DIAGNOSIS — Z01419 Encounter for gynecological examination (general) (routine) without abnormal findings: Secondary | ICD-10-CM | POA: Diagnosis not present

## 2019-05-30 ENCOUNTER — Other Ambulatory Visit: Payer: Self-pay | Admitting: Gastroenterology

## 2019-05-30 DIAGNOSIS — R1031 Right lower quadrant pain: Secondary | ICD-10-CM | POA: Diagnosis not present

## 2019-05-30 DIAGNOSIS — K5730 Diverticulosis of large intestine without perforation or abscess without bleeding: Secondary | ICD-10-CM

## 2019-06-01 ENCOUNTER — Other Ambulatory Visit: Payer: Self-pay

## 2019-06-01 MED ORDER — SIMVASTATIN 10 MG PO TABS: 10.0000 mg | ORAL_TABLET | Freq: Every day | ORAL | 3 refills | Status: DC

## 2019-06-04 ENCOUNTER — Ambulatory Visit
Admission: RE | Admit: 2019-06-04 | Discharge: 2019-06-04 | Disposition: A | Discharge status: Home / Self Care | Payer: Medicare Other | Source: Ambulatory Visit | Attending: Gastroenterology | Admitting: Gastroenterology

## 2019-06-04 ENCOUNTER — Other Ambulatory Visit: Payer: Self-pay

## 2019-06-04 ENCOUNTER — Encounter: Payer: Self-pay | Admitting: Radiology

## 2019-06-04 DIAGNOSIS — K760 Fatty (change of) liver, not elsewhere classified: Secondary | ICD-10-CM | POA: Diagnosis not present

## 2019-06-04 DIAGNOSIS — K5730 Diverticulosis of large intestine without perforation or abscess without bleeding: Secondary | ICD-10-CM | POA: Diagnosis not present

## 2019-06-04 DIAGNOSIS — R1031 Right lower quadrant pain: Secondary | ICD-10-CM

## 2019-06-04 MED ORDER — IOPAMIDOL (ISOVUE-300) INJECTION 61%
125.0000 mL | Freq: Once | INTRAVENOUS | Status: AC | PRN
  Administered 2019-06-04: 125 mL via INTRAVENOUS

## 2019-06-19 DIAGNOSIS — L821 Other seborrheic keratosis: Secondary | ICD-10-CM | POA: Diagnosis not present

## 2019-06-19 DIAGNOSIS — L817 Pigmented purpuric dermatosis: Secondary | ICD-10-CM | POA: Diagnosis not present

## 2019-06-19 DIAGNOSIS — L905 Scar conditions and fibrosis of skin: Secondary | ICD-10-CM | POA: Diagnosis not present

## 2019-06-19 DIAGNOSIS — D1801 Hemangioma of skin and subcutaneous tissue: Secondary | ICD-10-CM | POA: Diagnosis not present

## 2019-06-19 DIAGNOSIS — L814 Other melanin hyperpigmentation: Secondary | ICD-10-CM | POA: Diagnosis not present

## 2019-06-21 ENCOUNTER — Other Ambulatory Visit: Payer: Self-pay | Admitting: Gastroenterology

## 2019-06-21 DIAGNOSIS — R932 Abnormal findings on diagnostic imaging of liver and biliary tract: Secondary | ICD-10-CM

## 2019-06-21 DIAGNOSIS — R1031 Right lower quadrant pain: Secondary | ICD-10-CM

## 2019-06-26 ENCOUNTER — Encounter: Payer: Self-pay | Admitting: Internal Medicine

## 2019-06-26 ENCOUNTER — Ambulatory Visit (INDEPENDENT_AMBULATORY_CARE_PROVIDER_SITE_OTHER): Payer: Medicare Other | Admitting: Internal Medicine

## 2019-06-26 ENCOUNTER — Other Ambulatory Visit: Payer: Self-pay

## 2019-06-26 VITALS — BP 138/86 | HR 87 | Temp 98.5°F | Ht 74.0 in | Wt 288.0 lb

## 2019-06-26 DIAGNOSIS — K769 Liver disease, unspecified: Secondary | ICD-10-CM

## 2019-06-26 DIAGNOSIS — Z23 Encounter for immunization: Secondary | ICD-10-CM

## 2019-06-26 DIAGNOSIS — R1031 Right lower quadrant pain: Secondary | ICD-10-CM | POA: Diagnosis not present

## 2019-06-26 HISTORY — DX: Liver disease, unspecified: K76.9

## 2019-06-26 NOTE — Patient Instructions (Signed)
If you have medicare related insurance (such as traditional Medicare, Blue Cross Medicare, United HealthCare Medicare, or similar), Please make an appointment at the scheduling desk with Jill, the Wellness Health Coach, for your Wellness visit in this office, which is a benefit with your insurance.  

## 2019-06-26 NOTE — Assessment & Plan Note (Signed)
06/2019 CT ok - Dr Watt Climes MSK strain

## 2019-06-26 NOTE — Assessment & Plan Note (Signed)
06/2019 CT 3.5cm - Dr Watt Climes MRI ordered

## 2019-06-26 NOTE — Progress Notes (Signed)
Subjective:  Patient ID: Morgan Huff, female    DOB: May 10, 1946  Age: 73 y.o. MRN: [id number]  CC: No chief complaint on file.   HPI Morgan Huff presents for  R hip - s/p THR in 09/2018 F/u R groin pain - saw Dr Watt Climes, had a CT done: fatty liver w/a 3.5 cm lesion F/u dyslipidemia  Outpatient Medications Prior to Visit  Medication Sig Dispense Refill   Ascorbic Acid (VITAMIN C) 1000 MG tablet Take 2,000 mg by mouth daily.      Biotin 5 MG TABS Take 5,000 mg by mouth daily. Actually 5000 mcg -1 capsule daily     calcium carbonate (TUMS - DOSED IN MG ELEMENTAL CALCIUM) 500 MG chewable tablet Chew 2 tablets by mouth daily.      cholecalciferol (VITAMIN D) 1000 units tablet Take 2 tablets (2,000 Units total) by mouth daily. 100 tablet 3   ibuprofen (ADVIL) 200 MG tablet Take 400 mg by mouth daily.     Multiple Vitamins-Minerals (CENTRUM SILVER PO) Take 1 tablet by mouth daily.       Polyethyl Glycol-Propyl Glycol (SYSTANE) 0.4-0.3 % SOLN Place 1-2 drops into both eyes 3 (three) times daily as needed (for dry eyes).      Probiotic Product (PROBIOTIC PO)      simvastatin (ZOCOR) 10 MG tablet Take 1 tablet (10 mg total) by mouth daily. 90 tablet 3   timolol (BETIMOL) 0.5 % ophthalmic solution Place 1 drop into the left eye daily.      No facility-administered medications prior to visit.     ROS: Review of Systems  Constitutional: Negative for activity change, appetite change, chills, fatigue and unexpected weight change.  HENT: Negative for congestion, mouth sores and sinus pressure.   Eyes: Negative for visual disturbance.  Respiratory: Negative for cough and chest tightness.   Gastrointestinal: Negative for abdominal pain and nausea.  Genitourinary: Negative for difficulty urinating, frequency and vaginal pain.  Musculoskeletal: Positive for arthralgias, back pain and gait problem.  Skin: Negative for pallor and rash.  Neurological: Negative for dizziness,  tremors, weakness, numbness and headaches.  Psychiatric/Behavioral: Negative for confusion and sleep disturbance.    Objective:  BP 138/86    Pulse 87    Temp 98.5 F (36.9 C) (Oral)    Ht 6\' 2"  (1.88 m)    Wt 288 lb (130.6 kg)    SpO2 94%    BMI 36.98 kg/m   BP Readings from Last 3 Encounters:  06/26/19 138/86  03/26/19 (!) 142/98  09/23/18 (!) 113/59    Wt Readings from Last 3 Encounters:  06/26/19 288 lb (130.6 kg)  03/26/19 289 lb (131.1 kg)  09/22/18 277 lb (125.6 kg)    Physical Exam Constitutional:      General: She is not in acute distress.    Appearance: She is well-developed.  HENT:     Head: Normocephalic.     Right Ear: External ear normal.     Left Ear: External ear normal.     Nose: Nose normal.  Eyes:     General:        Right eye: No discharge.        Left eye: No discharge.     Conjunctiva/sclera: Conjunctivae normal.     Pupils: Pupils are equal, round, and reactive to light.  Neck:     Musculoskeletal: Normal range of motion and neck supple.     Thyroid: No thyromegaly.     Vascular: No  JVD.     Trachea: No tracheal deviation.  Cardiovascular:     Rate and Rhythm: Normal rate and regular rhythm.     Heart sounds: Normal heart sounds.  Pulmonary:     Effort: No respiratory distress.     Breath sounds: No stridor. No wheezing.  Abdominal:     General: Bowel sounds are normal. There is no distension.     Palpations: Abdomen is soft. There is no mass.     Tenderness: There is no abdominal tenderness. There is no guarding or rebound.  Musculoskeletal:        General: No tenderness.  Lymphadenopathy:     Cervical: No cervical adenopathy.  Skin:    Findings: No erythema or rash.  Neurological:     Cranial Nerves: No cranial nerve deficit.     Motor: No abnormal muscle tone.     Coordination: Coordination normal.     Deep Tendon Reflexes: Reflexes normal.  Psychiatric:        Behavior: Behavior normal.        Thought Content: Thought content  normal.        Judgment: Judgment normal.   R groin - sensitive  Lab Results  Component Value Date   WBC 15.2 (H) 09/23/2018   HGB 12.3 09/23/2018   HCT 39.2 09/23/2018   PLT 226 09/23/2018   GLUCOSE 98 03/26/2019   CHOL 177 06/10/2017   TRIG 151.0 (H) 06/10/2017   HDL 52.90 06/10/2017   LDLDIRECT 85.6 02/20/2008   LDLCALC 94 06/10/2017   ALT 17 03/26/2019   AST 25 03/26/2019   NA 140 03/26/2019   K 4.4 03/26/2019   CL 104 03/26/2019   CREATININE 0.86 03/26/2019   BUN 20 03/26/2019   CO2 30 03/26/2019   TSH 2.37 03/26/2019   INR 1.00 09/19/2018   HGBA1C 6.4 03/26/2019   MICROALBUR 0.7 04/12/2016    Ct Abdomen Pelvis W Contrast  Result Date: 06/04/2019 CLINICAL DATA:  Right lower quadrant pain for several months. Diverticulosis. EXAM: CT ABDOMEN AND PELVIS WITH CONTRAST TECHNIQUE: Multidetector CT imaging of the abdomen and pelvis was performed using the standard protocol following bolus administration of intravenous contrast. CONTRAST:  152mL ISOVUE-300 IOPAMIDOL (ISOVUE-300) INJECTION 61% COMPARISON:  None. FINDINGS: Lower Chest: No acute findings. Hepatobiliary: Mild diffuse hepatic steatosis. Ill-defined enhancing lesion is seen in the right hepatic lobe which measures approximately 3.5 x 2.1 cm on image 29/2. This could represent an area of focal fatty sparing or neoplasm. No other liver lesions are identified. Prior cholecystectomy. No evidence of biliary obstruction. Pancreas:  No mass or inflammatory changes. Spleen: Within normal limits in size and appearance. Adrenals/Urinary Tract: No masses identified. No evidence of hydronephrosis. Stomach/Bowel: No evidence of obstruction, inflammatory process or abnormal fluid collections. Normal appendix visualized. Diverticulosis is seen mainly involving the descending and sigmoid colon, however there is no evidence of diverticulitis. Vascular/Lymphatic: No pathologically enlarged lymph nodes. No abdominal aortic aneurysm. Aortic  atherosclerosis. Reproductive: Prior hysterectomy noted. Adnexal regions are unremarkable in appearance. Other:  None. Musculoskeletal: No suspicious bone lesions identified. Right hip prosthesis noted. Old compression fracture deformity of T2 vertebral body also noted. IMPRESSION: No evidence of appendicitis or other acute findings. Colonic diverticulosis, without radiographic evidence of diverticulitis. Mild diffuse hepatic steatosis. 3.5 cm enhancing lesion in right hepatic lobe, with differential diagnosis of focal fatty sparing versus neoplasm. Abdomen MRI without and with contrast is recommended for further characterization. Aortic Atherosclerosis (ICD10-I70.0). Electronically Signed   By: Linus Mako  Kris Hartmann M.D.   On: 06/04/2019 15:43    Assessment & Plan:   There are no diagnoses linked to this encounter.   No orders of the defined types were placed in this encounter.    Follow-up: No follow-ups on file.  Walker Kehr, MD

## 2019-07-08 ENCOUNTER — Other Ambulatory Visit: Payer: Self-pay | Admitting: Internal Medicine

## 2019-07-08 DIAGNOSIS — G459 Transient cerebral ischemic attack, unspecified: Secondary | ICD-10-CM

## 2019-07-08 HISTORY — DX: Transient cerebral ischemic attack, unspecified: G45.9

## 2019-07-08 MED ORDER — ASPIRIN EC 81 MG PO TBEC: 81.0000 mg | DELAYED_RELEASE_TABLET | Freq: Two times a day (BID) | ORAL | 3 refills | Status: AC

## 2019-07-09 ENCOUNTER — Other Ambulatory Visit: Payer: Self-pay | Admitting: Internal Medicine

## 2019-07-10 NOTE — Addendum Note (Signed)
Addended by: Karren Cobble on: 07/10/2019 03:52 PM   Modules accepted: Orders

## 2019-07-13 ENCOUNTER — Other Ambulatory Visit: Payer: Self-pay

## 2019-07-13 ENCOUNTER — Ambulatory Visit (HOSPITAL_COMMUNITY)
Admission: RE | Admit: 2019-07-13 | Discharge: 2019-07-13 | Disposition: A | Discharge status: Home / Self Care | Payer: Medicare Other | Source: Ambulatory Visit | Attending: Cardiovascular Disease | Admitting: Cardiovascular Disease

## 2019-07-13 DIAGNOSIS — G459 Transient cerebral ischemic attack, unspecified: Secondary | ICD-10-CM | POA: Diagnosis not present

## 2019-07-18 ENCOUNTER — Other Ambulatory Visit: Payer: Medicare Other

## 2019-07-18 ENCOUNTER — Ambulatory Visit
Admission: RE | Admit: 2019-07-18 | Discharge: 2019-07-18 | Disposition: A | Discharge status: Home / Self Care | Payer: Medicare Other | Source: Ambulatory Visit | Attending: Gastroenterology | Admitting: Gastroenterology

## 2019-07-18 ENCOUNTER — Other Ambulatory Visit: Payer: Self-pay

## 2019-07-18 DIAGNOSIS — R1031 Right lower quadrant pain: Secondary | ICD-10-CM

## 2019-07-18 DIAGNOSIS — R932 Abnormal findings on diagnostic imaging of liver and biliary tract: Secondary | ICD-10-CM

## 2019-07-18 MED ORDER — GADOBENATE DIMEGLUMINE 529 MG/ML IV SOLN
20.0000 mL | Freq: Once | INTRAVENOUS | Status: AC | PRN
  Administered 2019-07-18: 20 mL via INTRAVENOUS

## 2019-07-23 ENCOUNTER — Ambulatory Visit (INDEPENDENT_AMBULATORY_CARE_PROVIDER_SITE_OTHER): Payer: Medicare Other | Admitting: Internal Medicine

## 2019-07-23 ENCOUNTER — Encounter: Payer: Self-pay | Admitting: Internal Medicine

## 2019-07-23 ENCOUNTER — Other Ambulatory Visit: Payer: Self-pay

## 2019-07-23 VITALS — BP 144/88 | HR 85 | Temp 98.6°F | Ht 74.0 in | Wt 288.0 lb

## 2019-07-23 DIAGNOSIS — G459 Transient cerebral ischemic attack, unspecified: Secondary | ICD-10-CM

## 2019-07-23 DIAGNOSIS — K769 Liver disease, unspecified: Secondary | ICD-10-CM

## 2019-07-23 DIAGNOSIS — E785 Hyperlipidemia, unspecified: Secondary | ICD-10-CM

## 2019-07-23 DIAGNOSIS — D751 Secondary polycythemia: Secondary | ICD-10-CM | POA: Diagnosis not present

## 2019-07-23 NOTE — Progress Notes (Signed)
Subjective:  Patient ID: Morgan Huff, female    DOB: 07-27-1946  Age: 73 y.o. MRN: [id number]  CC: No chief complaint on file.   HPI Morgan Huff presents for a TIA - Dec 1st 2020:  "  I experienced what I can only call a brain fog episode last evening.  I couldn't recall what I was doing or where  I had started to go.  Simple things like how to turn on the TV or how to reach my neighbor forgetting I have a key to their place.  My blood pressure was 172 over 89 and pulse at 82 around 6pm when the nurse came and checked it.  I was not dizzy or unable to communicate or answer questions correctly.  No balance issues or fever or headache .  After her check up, we found no issues except the fogginess of my memory, and I passed all her tests."  F/u HTN, OA Outpatient Medications Prior to Visit  Medication Sig Dispense Refill   Ascorbic Acid (VITAMIN C) 500 MG CAPS Take 500 mg by mouth daily.      aspirin EC 81 MG tablet Take 1 tablet (81 mg total) by mouth 2 (two) times daily. 100 tablet 3   Biotin 5 MG TABS Take 5,000 mg by mouth daily. Actually 5000 mcg -1 capsule daily     calcium carbonate (TUMS - DOSED IN MG ELEMENTAL CALCIUM) 500 MG chewable tablet Chew 2 tablets by mouth daily.      cholecalciferol (VITAMIN D) 1000 units tablet Take 2 tablets (2,000 Units total) by mouth daily. 100 tablet 3   ibuprofen (ADVIL) 200 MG tablet Take 400 mg by mouth daily.     Multiple Vitamins-Minerals (CENTRUM SILVER PO) Take 1 tablet by mouth daily.       Polyethyl Glycol-Propyl Glycol (SYSTANE) 0.4-0.3 % SOLN Place 1-2 drops into both eyes 3 (three) times daily as needed (for dry eyes).      Probiotic Product (PROBIOTIC PO)      simvastatin (ZOCOR) 10 MG tablet Take 1 tablet (10 mg total) by mouth daily. 90 tablet 3   timolol (BETIMOL) 0.5 % ophthalmic solution Place 1 drop into the left eye daily.      No facility-administered medications prior to visit.    ROS: Review of Systems   Constitutional: Negative for activity change, appetite change, chills, fatigue and unexpected weight change.  HENT: Negative for congestion, mouth sores and sinus pressure.   Eyes: Negative for visual disturbance.  Respiratory: Negative for cough and chest tightness.   Gastrointestinal: Negative for abdominal pain and nausea.  Genitourinary: Negative for difficulty urinating, frequency and vaginal pain.  Musculoskeletal: Negative for back pain and gait problem.  Skin: Negative for pallor and rash.  Neurological: Negative for dizziness, tremors, weakness, numbness and headaches.  Psychiatric/Behavioral: Positive for decreased concentration. Negative for confusion, sleep disturbance and suicidal ideas. The patient is nervous/anxious.     Objective:  BP (!) 144/88 (BP Location: Left Arm, Patient Position: Sitting, Cuff Size: Large)   Pulse 85   Temp 98.6 F (37 C) (Oral)   Ht 6\' 2"  (1.88 m)   Wt 288 lb (130.6 kg)   SpO2 91%   BMI 36.98 kg/m   BP Readings from Last 3 Encounters:  07/23/19 (!) 144/88  06/26/19 138/86  03/26/19 (!) 142/98    Wt Readings from Last 3 Encounters:  07/23/19 288 lb (130.6 kg)  06/26/19 288 lb (130.6 kg)  03/26/19 289  lb (131.1 kg)    Physical Exam Constitutional:      General: She is not in acute distress.    Appearance: She is well-developed.  HENT:     Head: Normocephalic.     Right Ear: External ear normal.     Left Ear: External ear normal.     Nose: Nose normal.  Eyes:     General:        Right eye: No discharge.        Left eye: No discharge.     Conjunctiva/sclera: Conjunctivae normal.     Pupils: Pupils are equal, round, and reactive to light.  Neck:     Thyroid: No thyromegaly.     Vascular: No JVD.     Trachea: No tracheal deviation.  Cardiovascular:     Rate and Rhythm: Normal rate and regular rhythm.     Heart sounds: Normal heart sounds.  Pulmonary:     Effort: No respiratory distress.     Breath sounds: No stridor. No  wheezing.  Abdominal:     General: Bowel sounds are normal. There is no distension.     Palpations: Abdomen is soft. There is no mass.     Tenderness: There is no abdominal tenderness. There is no guarding or rebound.  Musculoskeletal:        General: No tenderness.     Cervical back: Normal range of motion and neck supple.  Lymphadenopathy:     Cervical: No cervical adenopathy.  Skin:    Findings: No erythema or rash.  Neurological:     Mental Status: She is oriented to person, place, and time.     Cranial Nerves: No cranial nerve deficit.     Motor: No abnormal muscle tone.     Coordination: Coordination normal.     Deep Tendon Reflexes: Reflexes normal.  Psychiatric:        Behavior: Behavior normal.        Thought Content: Thought content normal.        Judgment: Judgment normal.     Lab Results  Component Value Date   WBC 15.2 (H) 09/23/2018   HGB 12.3 09/23/2018   HCT 39.2 09/23/2018   PLT 226 09/23/2018   GLUCOSE 98 03/26/2019   CHOL 177 06/10/2017   TRIG 151.0 (H) 06/10/2017   HDL 52.90 06/10/2017   LDLDIRECT 85.6 02/20/2008   LDLCALC 94 06/10/2017   ALT 17 03/26/2019   AST 25 03/26/2019   NA 140 03/26/2019   K 4.4 03/26/2019   CL 104 03/26/2019   CREATININE 0.86 03/26/2019   BUN 20 03/26/2019   CO2 30 03/26/2019   TSH 2.37 03/26/2019   INR 1.00 09/19/2018   HGBA1C 6.4 03/26/2019   MICROALBUR 0.7 04/12/2016    MR ABDOMEN WWO CONTRAST  Result Date: 07/18/2019 CLINICAL DATA:  Liver lesion on CT. Chronic right lower quadrant abdominal pain. EXAM: MRI ABDOMEN WITHOUT AND WITH CONTRAST TECHNIQUE: Multiplanar multisequence MR imaging of the abdomen was performed both before and after the administration of intravenous contrast. CONTRAST:  63mL MULTIHANCE GADOBENATE DIMEGLUMINE 529 MG/ML IV SOLN COMPARISON:  CT abdomen/pelvis dated 06/04/2019 FINDINGS: Lower chest: Lung bases are clear. Hepatobiliary: No hepatic steatosis. 1.6 x 2.4 cm enhancing lesion in segment  7 (series 11/image 62), most conspicuous on the early arterial phase. By 45 seconds, the lesion has decreased in size to 7 mm (series 13/image 61). No corresponding abnormality on T2 or precontrast T1 imaging. No restricted diffusion. This appearance favors  a perfusion anomaly/vascular shunt. Status post cholecystectomy. No intrahepatic or extrahepatic ductal dilatation. Pancreas:  Within normal limits. Spleen:  Within normal limits. Adrenals/Urinary Tract:  Adrenal glands are within normal limits. Malrotated right kidney. Left kidney is within normal limits. No hydronephrosis. Stomach/Bowel: Stomach is within normal limits. Visualized bowel is unremarkable. Vascular/Lymphatic:  No evidence of abdominal aortic aneurysm. No suspicious abdominal lymphadenopathy. Other:  No abdominal ascites. Musculoskeletal: No focal osseous lesions. IMPRESSION: 1.6 x 2.4 cm enhancing lesion in segment 7, corresponding to the CT abnormality, most conspicuous on the early arterial phase. Given inconspicuity on additional sequences, the lesion favors a perfusion anomaly/vascular shunt. Consider follow-up MR in 6 months to document stability. Electronically Signed   By: Julian Hy M.D.   On: 07/18/2019 19:18    Assessment & Plan:     Follow-up: No follow-ups on file.  Walker Kehr, MD

## 2019-07-23 NOTE — Assessment & Plan Note (Signed)
MRI ok F/u w/Dr [facility]

## 2019-07-23 NOTE — Assessment & Plan Note (Signed)
Simvastatin 

## 2019-07-23 NOTE — Assessment & Plan Note (Signed)
ASA

## 2019-07-23 NOTE — Patient Instructions (Signed)
Trekking poles for walking(?) 

## 2019-07-23 NOTE — Assessment & Plan Note (Signed)
global aphasia, confusion Doppler (-) B Card ECHO ordered

## 2019-08-08 ENCOUNTER — Other Ambulatory Visit (HOSPITAL_COMMUNITY): Payer: Medicare Other

## 2019-08-08 ENCOUNTER — Other Ambulatory Visit: Payer: Self-pay

## 2019-08-08 ENCOUNTER — Ambulatory Visit (HOSPITAL_COMMUNITY): Payer: Medicare Other | Attending: Internal Medicine

## 2019-08-08 DIAGNOSIS — G459 Transient cerebral ischemic attack, unspecified: Secondary | ICD-10-CM | POA: Diagnosis not present

## 2019-08-16 DIAGNOSIS — Z23 Encounter for immunization: Secondary | ICD-10-CM | POA: Diagnosis not present

## 2019-09-12 DIAGNOSIS — Z23 Encounter for immunization: Secondary | ICD-10-CM | POA: Diagnosis not present

## 2019-09-17 MED ORDER — SIMVASTATIN 10 MG PO TABS: 10.0000 mg | ORAL_TABLET | Freq: Every day | ORAL | 3 refills | Status: DC

## 2019-10-09 DIAGNOSIS — M25552 Pain in left hip: Secondary | ICD-10-CM | POA: Diagnosis not present

## 2019-10-09 DIAGNOSIS — M1612 Unilateral primary osteoarthritis, left hip: Secondary | ICD-10-CM | POA: Diagnosis not present

## 2019-10-22 ENCOUNTER — Ambulatory Visit (INDEPENDENT_AMBULATORY_CARE_PROVIDER_SITE_OTHER): Payer: Medicare Other | Admitting: Internal Medicine

## 2019-10-22 ENCOUNTER — Other Ambulatory Visit: Payer: Self-pay

## 2019-10-22 ENCOUNTER — Encounter: Payer: Self-pay | Admitting: Internal Medicine

## 2019-10-22 DIAGNOSIS — R739 Hyperglycemia, unspecified: Secondary | ICD-10-CM

## 2019-10-22 DIAGNOSIS — G459 Transient cerebral ischemic attack, unspecified: Secondary | ICD-10-CM | POA: Diagnosis not present

## 2019-10-22 DIAGNOSIS — M160 Bilateral primary osteoarthritis of hip: Secondary | ICD-10-CM | POA: Diagnosis not present

## 2019-10-22 DIAGNOSIS — E785 Hyperlipidemia, unspecified: Secondary | ICD-10-CM

## 2019-10-22 DIAGNOSIS — D751 Secondary polycythemia: Secondary | ICD-10-CM | POA: Diagnosis not present

## 2019-10-22 LAB — HEPATIC FUNCTION PANEL
ALT: 17 U/L (ref 0–35)
AST: 19 U/L (ref 0–37)
Albumin: 4.1 g/dL (ref 3.5–5.2)
Alkaline Phosphatase: 71 U/L (ref 39–117)
Bilirubin, Direct: 0.1 mg/dL (ref 0.0–0.3)
Total Bilirubin: 0.6 mg/dL (ref 0.2–1.2)
Total Protein: 7.2 g/dL (ref 6.0–8.3)

## 2019-10-22 LAB — BASIC METABOLIC PANEL
BUN: 23 mg/dL (ref 6–23)
CO2: 29 mEq/L (ref 19–32)
Calcium: 10.3 mg/dL (ref 8.4–10.5)
Chloride: 105 mEq/L (ref 96–112)
Creatinine, Ser: 0.88 mg/dL (ref 0.40–1.20)
GFR: 62.82 mL/min (ref >60.00)
Glucose, Bld: 114 mg/dL — ABNORMAL HIGH (ref 70–99)
Potassium: 4.6 mEq/L (ref 3.5–5.1)
Sodium: 139 mEq/L (ref 135–145)

## 2019-10-22 LAB — CBC WITH DIFFERENTIAL/PLATELET
Basophils Absolute: 0 10*3/uL (ref 0.0–0.1)
Basophils Relative: 0.5 % (ref 0.0–3.0)
Eosinophils Absolute: 0.3 10*3/uL (ref 0.0–0.7)
Eosinophils Relative: 2.8 % (ref 0.0–5.0)
HCT: 45.1 % (ref 36.0–46.0)
Hemoglobin: 15.1 g/dL — ABNORMAL HIGH (ref 12.0–15.0)
Lymphocytes Relative: 27.1 % (ref 12.0–46.0)
Lymphs Abs: 2.4 10*3/uL (ref 0.7–4.0)
MCHC: 33.4 g/dL (ref 30.0–36.0)
MCV: 90.4 fl (ref 78.0–100.0)
Monocytes Absolute: 1 10*3/uL (ref 0.1–1.0)
Monocytes Relative: 11 % (ref 3.0–12.0)
Neutro Abs: 5.3 10*3/uL (ref 1.4–7.7)
Neutrophils Relative %: 58.6 % (ref 43.0–77.0)
Platelets: 279 10*3/uL (ref 150.0–400.0)
RBC: 4.99 Mil/uL (ref 3.87–5.11)
RDW: 13.8 % (ref 11.5–15.5)
WBC: 9 10*3/uL (ref 4.0–10.5)

## 2019-10-22 LAB — LIPID PANEL
Cholesterol: 154 mg/dL (ref 0–200)
HDL: 46.9 mg/dL (ref >39.00)
LDL Cholesterol: 81 mg/dL (ref 0–99)
NonHDL: 106.84
Total CHOL/HDL Ratio: 3
Triglycerides: 127 mg/dL (ref 0.0–149.0)
VLDL: 25.4 mg/dL (ref 0.0–40.0)

## 2019-10-22 LAB — HEMOGLOBIN A1C: Hgb A1c MFr Bld: 6.3 % (ref 4.6–6.5)

## 2019-10-22 LAB — TSH: TSH: 2.36 u[IU]/mL (ref 0.35–4.50)

## 2019-10-22 NOTE — Assessment & Plan Note (Signed)
No relapse 

## 2019-10-22 NOTE — Progress Notes (Signed)
Subjective:  Patient ID: Morgan Huff, female    DOB: [birth year]  Age: 74 y.o. MRN: [id number]  CC: No chief complaint on file.   HPI Morgan Huff presents for TIA, hyperglycemia, OA f/u  Outpatient Medications Prior to Visit  Medication Sig Dispense Refill  . Ascorbic Acid (VITAMIN C) 500 MG CAPS Take 500 mg by mouth daily.     Marland Kitchen aspirin EC 81 MG tablet Take 1 tablet (81 mg total) by mouth 2 (two) times daily. 100 tablet 3  . Biotin 5 MG TABS Take 5,000 mg by mouth daily. Actually 5000 mcg -1 capsule daily    . calcium carbonate (TUMS - DOSED IN MG ELEMENTAL CALCIUM) 500 MG chewable tablet Chew 2 tablets by mouth daily.     . cholecalciferol (VITAMIN D) 1000 units tablet Take 2 tablets (2,000 Units total) by mouth daily. 100 tablet 3  . ibuprofen (ADVIL) 200 MG tablet Take 400 mg by mouth daily.    . Multiple Vitamins-Minerals (CENTRUM SILVER PO) Take 1 tablet by mouth daily.      Vladimir Faster Glycol-Propyl Glycol (SYSTANE) 0.4-0.3 % SOLN Place 1-2 drops into both eyes 3 (three) times daily as needed (for dry eyes).     . simvastatin (ZOCOR) 10 MG tablet Take 1 tablet (10 mg total) by mouth daily. 90 tablet 3  . timolol (BETIMOL) 0.5 % ophthalmic solution Place 1 drop into the left eye daily.     . Probiotic Product (PROBIOTIC PO)      No facility-administered medications prior to visit.    ROS: Review of Systems  Constitutional: Positive for unexpected weight change. Negative for activity change, appetite change, chills and fatigue.  HENT: Negative for congestion, mouth sores and sinus pressure.   Eyes: Negative for visual disturbance.  Respiratory: Negative for cough and chest tightness.   Gastrointestinal: Negative for abdominal pain and nausea.  Genitourinary: Negative for difficulty urinating, frequency and vaginal pain.  Musculoskeletal: Positive for arthralgias and gait problem. Negative for back pain.  Skin: Negative for pallor and rash.  Neurological: Negative  for dizziness, tremors, weakness, numbness and headaches.  Psychiatric/Behavioral: Negative for confusion and sleep disturbance.    Objective:  BP (!) 166/90 (BP Location: Left Arm, Patient Position: Sitting, Cuff Size: Large)   Pulse 91   Temp 98.2 F (36.8 C) (Oral)   Ht 6\' 2"  (1.88 m)   Wt 292 lb (132.5 kg)   SpO2 96%   BMI 37.49 kg/m   BP Readings from Last 3 Encounters:  10/22/19 (!) 166/90  07/23/19 (!) 144/88  06/26/19 138/86    Wt Readings from Last 3 Encounters:  10/22/19 292 lb (132.5 kg)  07/23/19 288 lb (130.6 kg)  06/26/19 288 lb (130.6 kg)    Physical Exam Constitutional:      General: She is not in acute distress.    Appearance: She is well-developed. She is obese.  HENT:     Head: Normocephalic.     Right Ear: External ear normal.     Left Ear: External ear normal.     Nose: Nose normal.  Eyes:     General:        Right eye: No discharge.        Left eye: No discharge.     Conjunctiva/sclera: Conjunctivae normal.     Pupils: Pupils are equal, round, and reactive to light.  Neck:     Thyroid: No thyromegaly.     Vascular: No JVD.  Trachea: No tracheal deviation.  Cardiovascular:     Rate and Rhythm: Normal rate and regular rhythm.     Heart sounds: Normal heart sounds.  Pulmonary:     Effort: No respiratory distress.     Breath sounds: No stridor. No wheezing.  Abdominal:     General: Bowel sounds are normal. There is no distension.     Palpations: Abdomen is soft. There is no mass.     Tenderness: There is no abdominal tenderness. There is no guarding or rebound.  Musculoskeletal:        General: Tenderness present.     Cervical back: Normal range of motion and neck supple.  Lymphadenopathy:     Cervical: No cervical adenopathy.  Skin:    Findings: No erythema or rash.  Neurological:     Mental Status: She is oriented to person, place, and time.     Cranial Nerves: No cranial nerve deficit.     Motor: No abnormal muscle tone.      Coordination: Coordination normal.     Deep Tendon Reflexes: Reflexes normal.  Psychiatric:        Behavior: Behavior normal.        Thought Content: Thought content normal.        Judgment: Judgment normal.   LS - tender w/ROM  Lab Results  Component Value Date   WBC 15.2 (H) 09/23/2018   HGB 12.3 09/23/2018   HCT 39.2 09/23/2018   PLT 226 09/23/2018   GLUCOSE 98 03/26/2019   CHOL 177 06/10/2017   TRIG 151.0 (H) 06/10/2017   HDL 52.90 06/10/2017   LDLDIRECT 85.6 02/20/2008   LDLCALC 94 06/10/2017   ALT 17 03/26/2019   AST 25 03/26/2019   NA 140 03/26/2019   K 4.4 03/26/2019   CL 104 03/26/2019   CREATININE 0.86 03/26/2019   BUN 20 03/26/2019   CO2 30 03/26/2019   TSH 2.37 03/26/2019   INR 1.00 09/19/2018   HGBA1C 6.4 03/26/2019   MICROALBUR 0.7 04/12/2016    MR ABDOMEN WWO CONTRAST  Result Date: 07/18/2019 CLINICAL DATA:  Liver lesion on CT. Chronic right lower quadrant abdominal pain. EXAM: MRI ABDOMEN WITHOUT AND WITH CONTRAST TECHNIQUE: Multiplanar multisequence MR imaging of the abdomen was performed both before and after the administration of intravenous contrast. CONTRAST:  57mL MULTIHANCE GADOBENATE DIMEGLUMINE 529 MG/ML IV SOLN COMPARISON:  CT abdomen/pelvis dated 06/04/2019 FINDINGS: Lower chest: Lung bases are clear. Hepatobiliary: No hepatic steatosis. 1.6 x 2.4 cm enhancing lesion in segment 7 (series 11/image 62), most conspicuous on the early arterial phase. By 45 seconds, the lesion has decreased in size to 7 mm (series 13/image 61). No corresponding abnormality on T2 or precontrast T1 imaging. No restricted diffusion. This appearance favors a perfusion anomaly/vascular shunt. Status post cholecystectomy. No intrahepatic or extrahepatic ductal dilatation. Pancreas:  Within normal limits. Spleen:  Within normal limits. Adrenals/Urinary Tract:  Adrenal glands are within normal limits. Malrotated right kidney. Left kidney is within normal limits. No hydronephrosis.  Stomach/Bowel: Stomach is within normal limits. Visualized bowel is unremarkable. Vascular/Lymphatic:  No evidence of abdominal aortic aneurysm. No suspicious abdominal lymphadenopathy. Other:  No abdominal ascites. Musculoskeletal: No focal osseous lesions. IMPRESSION: 1.6 x 2.4 cm enhancing lesion in segment 7, corresponding to the CT abnormality, most conspicuous on the early arterial phase. Given inconspicuity on additional sequences, the lesion favors a perfusion anomaly/vascular shunt. Consider follow-up MR in 6 months to document stability. Electronically Signed   By: Henderson Newcomer.D.  On: 07/18/2019 19:18    Assessment & Plan:   There are no diagnoses linked to this encounter.   No orders of the defined types were placed in this encounter.    Follow-up: No follow-ups on file.  Walker Kehr, MD

## 2019-10-22 NOTE — Assessment & Plan Note (Signed)
Cardiac CT scan for calcium scoring offered 

## 2019-10-22 NOTE — Assessment & Plan Note (Signed)
R - moderate OA on X ray - s/p R THR 2/20, Left - getting worse

## 2019-10-22 NOTE — Assessment & Plan Note (Signed)
Monitor CBC 

## 2019-10-22 NOTE — Assessment & Plan Note (Signed)
Labs

## 2019-10-22 NOTE — Patient Instructions (Signed)

## 2019-10-23 ENCOUNTER — Other Ambulatory Visit: Payer: Self-pay | Admitting: Internal Medicine

## 2019-10-23 DIAGNOSIS — R739 Hyperglycemia, unspecified: Secondary | ICD-10-CM

## 2019-10-23 DIAGNOSIS — D751 Secondary polycythemia: Secondary | ICD-10-CM

## 2019-10-29 DIAGNOSIS — H401331 Pigmentary glaucoma, bilateral, mild stage: Secondary | ICD-10-CM | POA: Diagnosis not present

## 2019-11-13 IMAGING — RF DG HIP (WITH PELVIS) OPERATIVE*R*
1 series · 2 of 2 positions shown · non-contrast
Comparison: Plain films right hip 04/12/2016.

CLINICAL DATA: Intraoperative imaging for right hip replacement.

EXAM:
OPERATIVE RIGHT HIP (WITH PELVIS IF PERFORMED) 2 VIEWS
TECHNIQUE: Fluoroscopic spot image(s) were submitted for interpretation
post-operatively.

[Series 1: unknown protocol · 0.20mm/px · 2 of 2 slices shown]
[im 1/2]
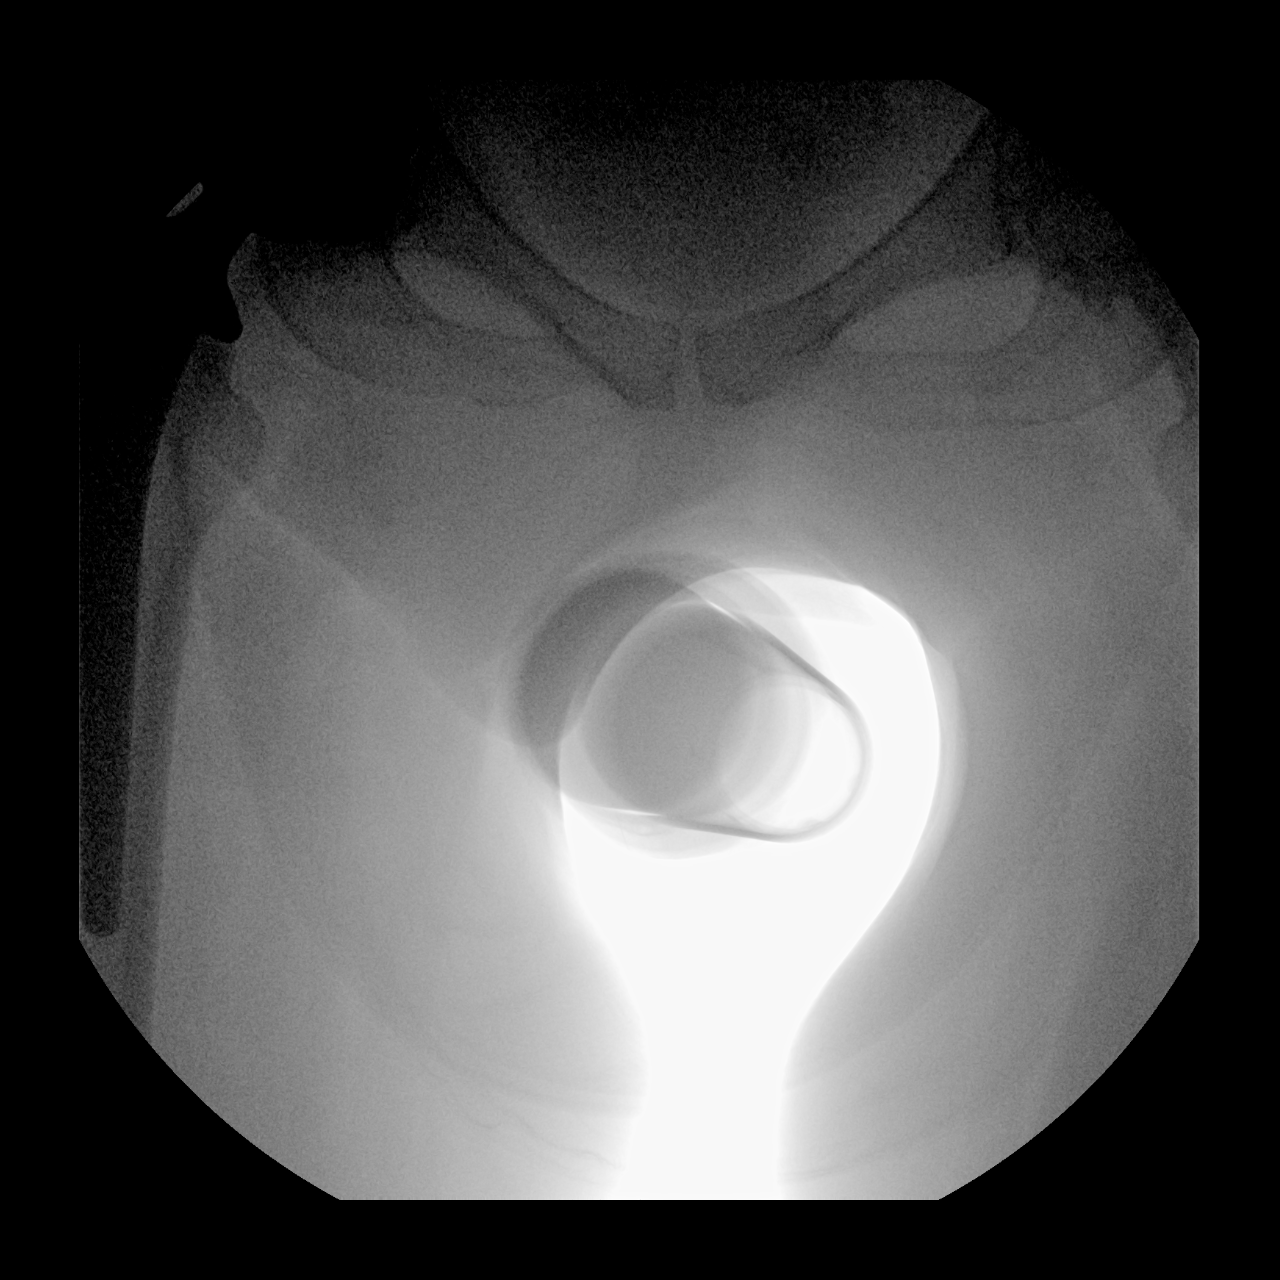
[im 2/2]
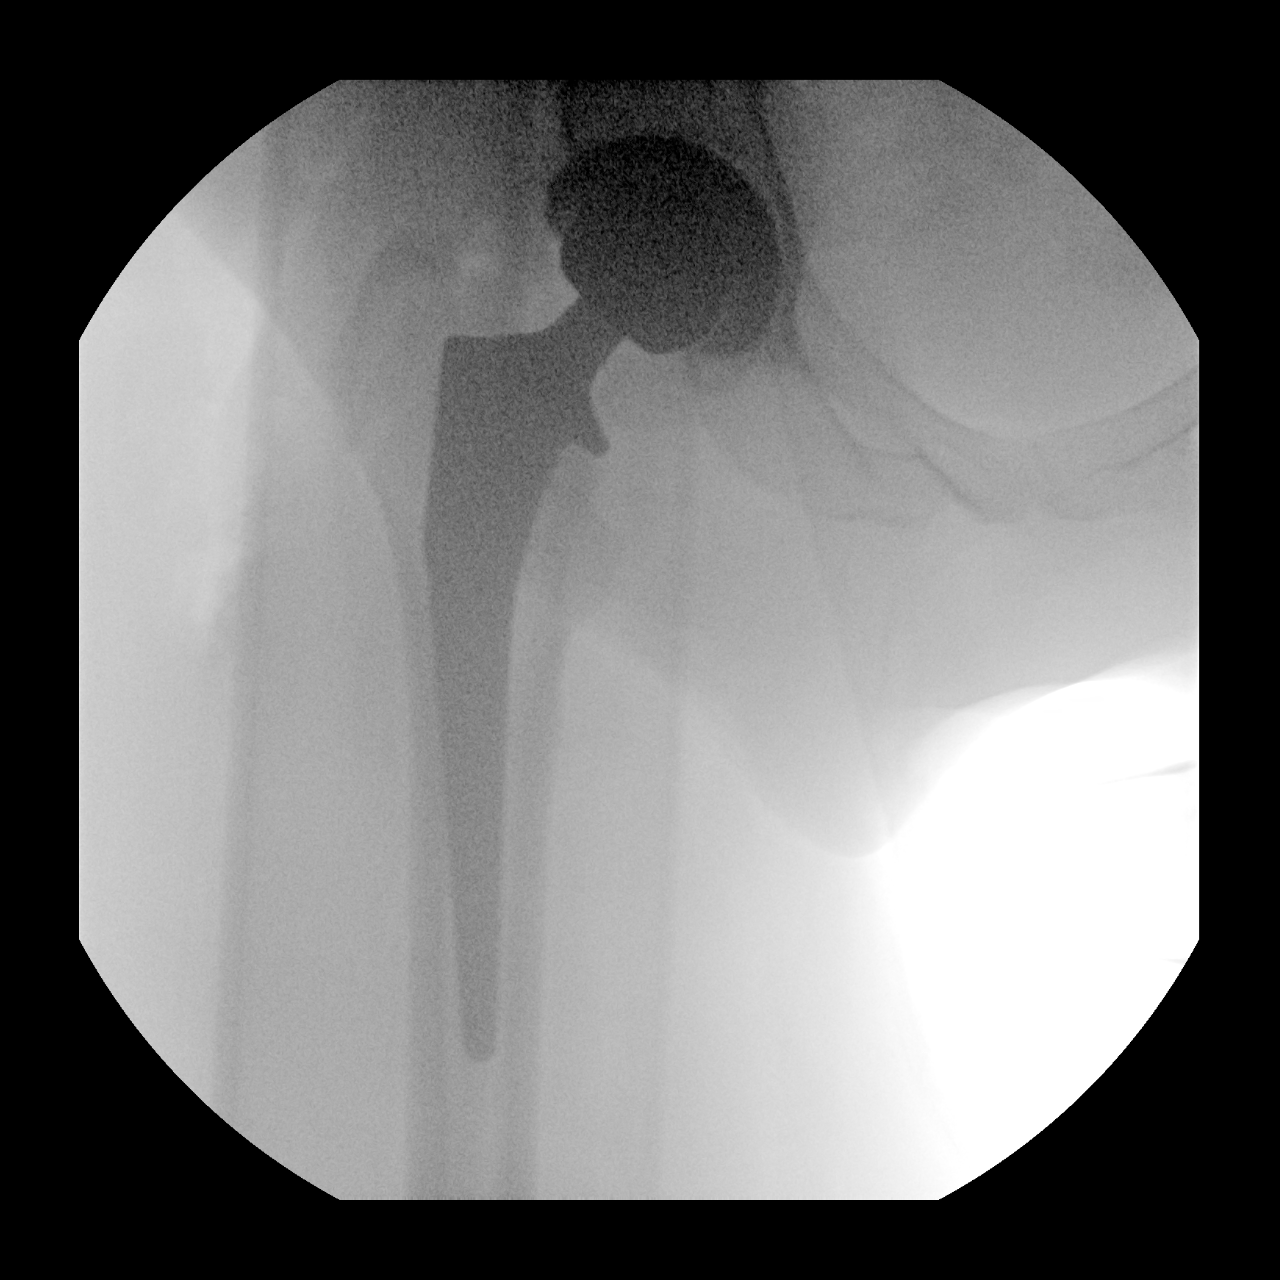

[2 of 2 positions shown; findings below may reference images not displayed]

FINDINGS: Two fluoroscopic intraoperative spot images are provided. Right
total hip arthroplasty is in place. No acute finding.
IMPRESSION: Intraoperative imaging for right hip replacement. No acute
abnormality.

## 2019-11-28 ENCOUNTER — Other Ambulatory Visit: Payer: Self-pay | Admitting: Internal Medicine

## 2019-11-28 DIAGNOSIS — E785 Hyperlipidemia, unspecified: Secondary | ICD-10-CM

## 2019-12-10 DIAGNOSIS — H401331 Pigmentary glaucoma, bilateral, mild stage: Secondary | ICD-10-CM | POA: Diagnosis not present

## 2019-12-12 ENCOUNTER — Ambulatory Visit (INDEPENDENT_AMBULATORY_CARE_PROVIDER_SITE_OTHER)
Admission: RE | Admit: 2019-12-12 | Discharge: 2019-12-12 | Disposition: A | Discharge status: Home / Self Care | Payer: Self-pay | Source: Ambulatory Visit | Attending: Internal Medicine | Admitting: Internal Medicine

## 2019-12-12 ENCOUNTER — Other Ambulatory Visit: Payer: Self-pay

## 2019-12-12 DIAGNOSIS — E785 Hyperlipidemia, unspecified: Secondary | ICD-10-CM

## 2019-12-24 ENCOUNTER — Ambulatory Visit: Payer: Medicare Other | Admitting: Internal Medicine

## 2020-01-14 DIAGNOSIS — H401331 Pigmentary glaucoma, bilateral, mild stage: Secondary | ICD-10-CM | POA: Diagnosis not present

## 2020-01-28 ENCOUNTER — Other Ambulatory Visit: Payer: Self-pay | Admitting: Gastroenterology

## 2020-01-28 DIAGNOSIS — K769 Liver disease, unspecified: Secondary | ICD-10-CM

## 2020-02-12 ENCOUNTER — Other Ambulatory Visit: Payer: Self-pay | Admitting: Internal Medicine

## 2020-02-12 MED ORDER — CEPHALEXIN 500 MG PO CAPS: 500.0000 mg | ORAL_CAPSULE | Freq: Four times a day (QID) | ORAL | 0 refills | Status: DC

## 2020-02-21 ENCOUNTER — Ambulatory Visit
Admission: RE | Admit: 2020-02-21 | Discharge: 2020-02-21 | Disposition: A | Discharge status: Home / Self Care | Payer: Medicare Other | Source: Ambulatory Visit | Attending: Gastroenterology | Admitting: Gastroenterology

## 2020-02-21 ENCOUNTER — Other Ambulatory Visit: Payer: Self-pay

## 2020-02-21 DIAGNOSIS — K7689 Other specified diseases of liver: Secondary | ICD-10-CM | POA: Diagnosis not present

## 2020-02-21 DIAGNOSIS — K8689 Other specified diseases of pancreas: Secondary | ICD-10-CM | POA: Diagnosis not present

## 2020-02-21 DIAGNOSIS — K769 Liver disease, unspecified: Secondary | ICD-10-CM

## 2020-02-21 DIAGNOSIS — M47816 Spondylosis without myelopathy or radiculopathy, lumbar region: Secondary | ICD-10-CM | POA: Diagnosis not present

## 2020-02-21 DIAGNOSIS — Z9049 Acquired absence of other specified parts of digestive tract: Secondary | ICD-10-CM | POA: Diagnosis not present

## 2020-02-21 MED ORDER — GADOBENATE DIMEGLUMINE 529 MG/ML IV SOLN
20.0000 mL | Freq: Once | INTRAVENOUS | Status: AC | PRN
  Administered 2020-02-21: 20 mL via INTRAVENOUS

## 2020-03-24 DIAGNOSIS — Z1231 Encounter for screening mammogram for malignant neoplasm of breast: Secondary | ICD-10-CM | POA: Diagnosis not present

## 2020-04-23 ENCOUNTER — Ambulatory Visit (INDEPENDENT_AMBULATORY_CARE_PROVIDER_SITE_OTHER): Payer: Medicare Other

## 2020-04-23 ENCOUNTER — Other Ambulatory Visit: Payer: Self-pay

## 2020-04-23 ENCOUNTER — Ambulatory Visit (INDEPENDENT_AMBULATORY_CARE_PROVIDER_SITE_OTHER): Payer: Medicare Other | Admitting: Internal Medicine

## 2020-04-23 ENCOUNTER — Encounter: Payer: Self-pay | Admitting: Internal Medicine

## 2020-04-23 VITALS — BP 144/92 | HR 93 | Temp 98.4°F | Ht 74.0 in | Wt 286.0 lb

## 2020-04-23 DIAGNOSIS — M160 Bilateral primary osteoarthritis of hip: Secondary | ICD-10-CM

## 2020-04-23 DIAGNOSIS — R739 Hyperglycemia, unspecified: Secondary | ICD-10-CM | POA: Diagnosis not present

## 2020-04-23 DIAGNOSIS — Z23 Encounter for immunization: Secondary | ICD-10-CM

## 2020-04-23 DIAGNOSIS — D751 Secondary polycythemia: Secondary | ICD-10-CM | POA: Diagnosis not present

## 2020-04-23 DIAGNOSIS — E785 Hyperlipidemia, unspecified: Secondary | ICD-10-CM

## 2020-04-23 DIAGNOSIS — M545 Low back pain, unspecified: Secondary | ICD-10-CM

## 2020-04-23 DIAGNOSIS — Z0000 Encounter for general adult medical examination without abnormal findings: Secondary | ICD-10-CM

## 2020-04-23 DIAGNOSIS — G8929 Other chronic pain: Secondary | ICD-10-CM

## 2020-04-23 DIAGNOSIS — K769 Liver disease, unspecified: Secondary | ICD-10-CM | POA: Diagnosis not present

## 2020-04-23 DIAGNOSIS — M549 Dorsalgia, unspecified: Secondary | ICD-10-CM | POA: Insufficient documentation

## 2020-04-23 NOTE — Assessment & Plan Note (Signed)
Simvastatin 

## 2020-04-23 NOTE — Patient Instructions (Signed)
[name] , Thank you for taking time to come for your Medicare Wellness Visit. I appreciate your ongoing commitment to your health goals. Please review the following plan we discussed and let me know if I can assist you in the future.   Screening recommendations/referrals: Colonoscopy: 02/21/2019 Mammogram: 03/24/2020 Bone Density: 08/02/2010 Recommended yearly ophthalmology/optometry visit for glaucoma screening and checkup Recommended yearly dental visit for hygiene and checkup  Vaccinations: Influenza vaccine: 04/23/2020 Pneumococcal vaccine: up to date Tdap vaccine: up to date Shingles vaccine: up to date   Covid-19: up to date  Advanced directives: Documents on file.  Conditions/risks identified: Yes; Reviewed health maintenance screenings with patient today and relevant education, vaccines, and/or referrals were provided. Please continue to do your personal lifestyle choices by: daily care of teeth and gums, regular physical activity (goal should be 5 days a week for 30 minutes), eat a healthy diet, avoid tobacco and drug use, limiting any alcohol intake, taking a low-dose aspirin (if not allergic or have been advised by your provider otherwise) and taking vitamins and minerals as recommended by your provider. Continue doing brain stimulating activities (puzzles, reading, adult coloring books, staying active) to keep memory sharp. Continue to eat heart healthy diet (full of fruits, vegetables, whole grains, lean protein, water--limit salt, fat, and sugar intake) and increase physical activity as tolerated.  Next appointment: Please schedule your next Medicare Wellness Visit with your Nurse Health Advisor in 1 year.   Preventive Care 74 Years and Older, Female Preventive care refers to lifestyle choices and visits with your health care provider that can promote health and wellness. What does preventive care include?  A yearly physical exam. This is also called an annual well  check.  Dental exams once or twice a year.  Routine eye exams. Ask your health care provider how often you should have your eyes checked.  Personal lifestyle choices, including:  Daily care of your teeth and gums.  Regular physical activity.  Eating a healthy diet.  Avoiding tobacco and drug use.  Limiting alcohol use.  Practicing safe sex.  Taking low-dose aspirin every day.  Taking vitamin and mineral supplements as recommended by your health care provider. What happens during an annual well check? The services and screenings done by your health care provider during your annual well check will depend on your age, overall health, lifestyle risk factors, and family history of disease. Counseling  Your health care provider may ask you questions about your:  Alcohol use.  Tobacco use.  Drug use.  Emotional well-being.  Home and relationship well-being.  Sexual activity.  Eating habits.  History of falls.  Memory and ability to understand (cognition).  Work and work Statistician.  Reproductive health. Screening  You may have the following tests or measurements:  Height, weight, and BMI.  Blood pressure.  Lipid and cholesterol levels. These may be checked every 5 years, or more frequently if you are over 69 years old.  Skin check.  Lung cancer screening. You may have this screening every year starting at age 41 if you have a 30-pack-year history of smoking and currently smoke or have quit within the past 15 years.  Fecal occult blood test (FOBT) of the stool. You may have this test every year starting at age 62.  Flexible sigmoidoscopy or colonoscopy. You may have a sigmoidoscopy every 5 years or a colonoscopy every 10 years starting at age 66.  Hepatitis C blood test.  Hepatitis B blood test.  Sexually transmitted disease (  STD) testing.  Diabetes screening. This is done by checking your blood sugar (glucose) after you have not eaten for a while  (fasting). You may have this done every 1-3 years.  Bone density scan. This is done to screen for osteoporosis. You may have this done starting at age 18.  Mammogram. This may be done every 1-2 years. Talk to your health care provider about how often you should have regular mammograms. Talk with your health care provider about your test results, treatment options, and if necessary, the need for more tests. Vaccines  Your health care provider may recommend certain vaccines, such as:  Influenza vaccine. This is recommended every year.  Tetanus, diphtheria, and acellular pertussis (Tdap, Td) vaccine. You may need a Td booster every 10 years.  Zoster vaccine. You may need this after age 64.  Pneumococcal 13-valent conjugate (PCV13) vaccine. One dose is recommended after age 39.  Pneumococcal polysaccharide (PPSV23) vaccine. One dose is recommended after age 9. Talk to your health care provider about which screenings and vaccines you need and how often you need them. This information is not intended to replace advice given to you by your health care provider. Make sure you discuss any questions you have with your health care provider. Document Released: 08/15/2015 Document Revised: 04/07/2016 Document Reviewed: 05/20/2015 Elsevier Interactive Patient Education  2017 [place] Prevention in the Home Falls can cause injuries. They can happen to people of all ages. There are many things you can do to make your home safe and to help prevent falls. What can I do on the outside of my home?  Regularly fix the edges of walkways and driveways and fix any cracks.  Remove anything that might make you trip as you walk through a door, such as a raised step or threshold.  Trim any bushes or trees on the path to your home.  Use bright outdoor lighting.  Clear any walking paths of anything that might make someone trip, such as rocks or tools.  Regularly check to see if handrails are loose  or broken. Make sure that both sides of any steps have handrails.  Any raised decks and porches should have guardrails on the edges.  Have any leaves, snow, or ice cleared regularly.  Use sand or salt on walking paths during winter.  Clean up any spills in your garage right away. This includes oil or grease spills. What can I do in the bathroom?  Use night lights.  Install grab bars by the toilet and in the tub and shower. Do not use towel bars as grab bars.  Use non-skid mats or decals in the tub or shower.  If you need to sit down in the shower, use a plastic, non-slip stool.  Keep the floor dry. Clean up any water that spills on the floor as soon as it happens.  Remove soap buildup in the tub or shower regularly.  Attach bath mats securely with double-sided non-slip rug tape.  Do not have throw rugs and other things on the floor that can make you trip. What can I do in the bedroom?  Use night lights.  Make sure that you have a light by your bed that is easy to reach.  Do not use any sheets or blankets that are too big for your bed. They should not hang down onto the floor.  Have a firm chair that has side arms. You can use this for support while you get dressed.  Do  not have throw rugs and other things on the floor that can make you trip. What can I do in the kitchen?  Clean up any spills right away.  Avoid walking on wet floors.  Keep items that you use a lot in easy-to-reach places.  If you need to reach something above you, use a strong step stool that has a grab bar.  Keep electrical cords out of the way.  Do not use floor polish or wax that makes floors slippery. If you must use wax, use non-skid floor wax.  Do not have throw rugs and other things on the floor that can make you trip. What can I do with my stairs?  Do not leave any items on the stairs.  Make sure that there are handrails on both sides of the stairs and use them. Fix handrails that are  broken or loose. Make sure that handrails are as long as the stairways.  Check any carpeting to make sure that it is firmly attached to the stairs. Fix any carpet that is loose or worn.  Avoid having throw rugs at the top or bottom of the stairs. If you do have throw rugs, attach them to the floor with carpet tape.  Make sure that you have a light switch at the top of the stairs and the bottom of the stairs. If you do not have them, ask someone to add them for you. What else can I do to help prevent falls?  Wear shoes that:  Do not have high heels.  Have rubber bottoms.  Are comfortable and fit you well.  Are closed at the toe. Do not wear sandals.  If you use a stepladder:  Make sure that it is fully opened. Do not climb a closed stepladder.  Make sure that both sides of the stepladder are locked into place.  Ask someone to hold it for you, if possible.  Clearly mark and make sure that you can see:  Any grab bars or handrails.  First and last steps.  Where the edge of each step is.  Use tools that help you move around (mobility aids) if they are needed. These include:  Canes.  Walkers.  Scooters.  Crutches.  Turn on the lights when you go into a dark area. Replace any light bulbs as soon as they burn out.  Set up your furniture so you have a clear path. Avoid moving your furniture around.  If any of your floors are uneven, fix them.  If there are any pets around you, be aware of where they are.  Review your medicines with your doctor. Some medicines can make you feel dizzy. This can increase your chance of falling. Ask your doctor what other things that you can do to help prevent falls. This information is not intended to replace advice given to you by your health care provider. Make sure you discuss any questions you have with your health care provider. Document Released: 05/15/2009 Document Revised: 12/25/2015 Document Reviewed: 08/23/2014 Elsevier  Interactive Patient Education  2017 [employer].

## 2020-04-23 NOTE — Progress Notes (Addendum)
Subjective:   Morgan Huff is a 74 y.o. female who presents for Medicare Annual (Subsequent) preventive examination.  Review of Systems    No ROS. Medicare Wellness Visit. Cardiac Risk Factors include: advanced age (>27men, >63 women);dyslipidemia;family history of premature cardiovascular disease;hypertension;obesity (BMI >30kg/m2)     Objective:    Today's Vitals   04/23/20 1540  BP: (!) 144/92  Pulse: 93  Temp: 98.4 F (36.9 C)  SpO2: 95%  Weight: 286 lb (129.7 kg)  Height: 6\' 2"  (1.88 m)   Body mass index is 36.72 kg/m.  Advanced Directives 04/23/2020 09/22/2018 09/22/2018 09/19/2018 04/14/2018 04/13/2017 06/05/2015  Does Patient Have a Medical Advance Directive? Yes Yes Yes Yes Yes Yes No  Type of Paramedic of [place];Living will [place];Living will Living will;Healthcare Power of [place];Living will [place];Living will -  Does patient want to make changes to medical advance directive? - No - Patient declined - No - Patient declined - - -  Copy of [place] in Chart? Yes - validated most recent copy scanned in chart (See row information) - No - copy requested No - copy requested - No - copy requested No - copy requested  Would patient like information on creating a medical advance directive? - - - - - - No - patient declined information    Current Medications (verified) Outpatient Encounter Medications as of 04/23/2020  Medication Sig   Ascorbic Acid (VITAMIN C) 500 MG CAPS Take 500 mg by mouth daily.    aspirin EC 81 MG tablet Take 1 tablet (81 mg total) by mouth 2 (two) times daily.   Biotin 5 MG TABS Take 5,000 mg by mouth daily. Actually 5000 mcg -1 capsule daily   calcium carbonate (TUMS - DOSED IN MG ELEMENTAL CALCIUM) 500 MG chewable tablet Chew 2 tablets by mouth daily.    cholecalciferol (VITAMIN D) 1000 units tablet  Take 2 tablets (2,000 Units total) by mouth daily.   ibuprofen (ADVIL) 200 MG tablet Take 400 mg by mouth daily.   Multiple Vitamins-Minerals (CENTRUM SILVER PO) Take 1 tablet by mouth daily.     Polyethyl Glycol-Propyl Glycol (SYSTANE) 0.4-0.3 % SOLN Place 1-2 drops into both eyes 3 (three) times daily as needed (for dry eyes).    simvastatin (ZOCOR) 10 MG tablet Take 1 tablet (10 mg total) by mouth daily.   timolol (BETIMOL) 0.5 % ophthalmic solution Place 1 drop into the left eye daily.    No facility-administered encounter medications on file as of 04/23/2020.    Allergies (verified) Latex   History: Past Medical History:  Diagnosis Date   Arthritis    Colon polyps    Diverticulosis of colon    Dysrhythmia    palpitations recently   Fasting hyperglycemia    Glaucoma    Dr Edilia Bo   Heart murmur    Hyperlipidemia    LDL goal = < 100; Lp(a) 112; hyperabsorber   Pre-diabetes    per patient   Past Surgical History:  Procedure Laterality Date   ABDOMINAL HYSTERECTOMY     ANTERIOR AND POSTERIOR REPAIR WITH SACROSPINOUS FIXATION N/A 06/05/2015   Procedure: ANTERIOR AND POSTERIOR REPAIR WITH SACROSPINOUS LIGAMENT SUSPENSION;  Surgeon: Louretta Shorten, MD;  Location: [facility];  Service: Gynecology;  Laterality: N/A;   CATARACT EXTRACTION, BILATERAL     lens implant   CHOLECYSTECTOMY     CHOLECYSTECTOMY, LAPAROSCOPIC  Dr Margot Chimes   COLONOSCOPY  2015   neg; due 2020   colonoscopy with polypectomy     Dr Watt Climes; X 3   EYE SURGERY     trabeculectomy ;Dr Kendall Flack.Cataract removal OD; Dr Scot Dock   TOTAL HIP ARTHROPLASTY Right 09/22/2018   Procedure: TOTAL HIP ARTHROPLASTY ANTERIOR APPROACH;  Surgeon: Dorna Leitz, MD;  Location: [facility];  Service: Orthopedics;  Laterality: Right;   Family History  Problem Relation Age of Onset   Polycythemia Father        transition into leukemia   Hypokalemia Father    Leukemia Father    Glaucoma Father    Heart attack Father 83       in context  hypokalemia   Lung cancer Mother        smoker   Heart attack Maternal Grandfather 3   Breast cancer Maternal Grandmother    Melanoma Brother    Diabetes Neg Hx    Stroke Neg Hx    Social History   Socioeconomic History   Marital status: Single    Spouse name: Not on file   Number of children: Not on file   Years of education: Not on file   Highest education level: Not on file  Occupational History   Occupation: Mudlogger of shows, semi-retired  Tobacco Use   Smoking status: Never Smoker   Smokeless tobacco: Never Used  Scientific laboratory technician Use: Never used  Substance and Sexual Activity   Alcohol use: Yes    Comment:  rarely   Drug use: No   Sexual activity: Not Currently  Other Topics Concern   Not on file  Social History Narrative   Not on file   Social Determinants of Health   Financial Resource Strain:    Difficulty of Paying Living Expenses: Not on file  Food Insecurity:    Worried About Charity fundraiser in the Last Year: Not on file   [employer] of Food in the Last Year: Not on file  Transportation Needs: No Transportation Needs   Lack of Transportation (Medical): No   Lack of Transportation (Non-Medical): No  Physical Activity: Sufficiently Active   Days of Exercise per Week: 5 days   Minutes of Exercise per Session: 30 min  Stress:    Feeling of Stress : Not on file  Social Connections:    Frequency of Communication with Friends and Family: Not on file   Frequency of Social Gatherings with Friends and Family: Not on file   Attends Religious Services: Not on file   Active Member of Clubs or Organizations: Not on file   Attends Archivist Meetings: Not on file   Marital Status: Not on file    Tobacco Counseling Counseling given: Not Answered   Clinical Intake:  Pre-visit preparation completed: Yes  Pain : No/denies pain     BMI - recorded: 36.72 Nutritional Status: BMI > 30  Obese Nutritional Risks: None Diabetes: No  How often  do you need to have someone help you when you read instructions, pamphlets, or other written materials from your doctor or pharmacy?: 1 - Never What is the last grade level you completed in school?: Bachelor's Degree  Diabetic? no  Interpreter Needed?: No  Information entered by :: Elsye Mccollister N. Jakevion Arney, LPN   Activities of Daily Living In your present state of health, do you have any difficulty performing the following activities: 04/23/2020  Hearing? N  Vision? N  Difficulty concentrating or making  decisions? N  Walking or climbing stairs? N  Dressing or bathing? N  Doing errands, shopping? N  Preparing Food and eating ? N  Using the Toilet? N  In the past six months, have you accidently leaked urine? Y  Comment wears a mini pad for ptotection  Do you have problems with loss of bowel control? N  Managing your Medications? N  Managing your Finances? N  Housekeeping or managing your Housekeeping? N  Some recent data might be hidden    Patient Care Team: Plotnikov, Evie Lacks, MD as PCP - General (Internal Medicine) Clarene Essex, MD as Consulting Physician (Gastroenterology) Louretta Shorten, MD as Consulting Physician (Obstetrics and Gynecology) Stefanie Libel, MD as Consulting Physician (Sports Medicine) Dorna Leitz, MD as Consulting Physician (Orthopedic Surgery)  Indicate any recent Medical Services you may have received from other than [facility] providers in the past year (date may be approximate).     Assessment:   This is a routine wellness examination for [place].  Hearing/Vision screen No exam data present  Dietary issues and exercise activities discussed: Current Exercise Habits: Home exercise routine, Type of exercise: walking, Time (Minutes): 30, Frequency (Times/Week): 5, Weekly Exercise (Minutes/Week): 150, Intensity: Moderate, Exercise limited by: cardiac condition(s);orthopedic condition(s)  Goals      I want to be more comfortable with phsical activity     Increase  my exercise in the body areas that I feel weaker and take advantage of the new gym facility at [facility]     Patient Stated     I want to increase physical activity by going yoga classes and work towards decreasing the amount of food I eat.        Depression Screen PHQ 2/9 Scores 04/23/2020 10/22/2019 04/14/2018 04/13/2017 09/07/2016 04/12/2016 04/11/2015  PHQ - 2 Score 1 0 0 0 0 0 0  PHQ- 9 Score - - - 0 - - -  Exception Documentation - - - - Other- indicate reason in comment box - -    Fall Risk Fall Risk  04/23/2020 10/22/2019 04/14/2018 04/13/2017 09/07/2016  Falls in the past year? 0 0 No Yes No  Number falls in past yr: 0 - - - -  Injury with Fall? 0 - - No -  Risk for fall due to : No Fall Risks - Impaired mobility - Other (Comment)  Follow up Falls evaluation completed Falls evaluation completed - - -    Any stairs in or around the home? No  If so, are there any without handrails? No  Home free of loose throw rugs in walkways, pet beds, electrical cords, etc? Yes  Adequate lighting in your home to reduce risk of falls? Yes   ASSISTIVE DEVICES UTILIZED TO PREVENT FALLS:  Life alert? Yes  Use of a cane, walker or w/c? No  Grab bars in the bathroom? Yes  Shower chair or bench in shower? Yes  Elevated toilet seat or a handicapped toilet? Yes   TIMED UP AND GO:  Was the test performed? No .  Length of time to ambulate 10 feet: 0 sec.   Gait steady and fast without use of assistive device  Cognitive Function: MMSE - Mini Mental State Exam 04/13/2017  Orientation to time 5  Orientation to Place 5  Registration 3  Attention/ Calculation 5  Recall 3  Language- name 2 objects 2  Language- repeat 1  Language- follow 3 step command 3  Language- read & follow direction 1  Write a sentence 1  Copy design 1  Total score 30     6CIT Screen 04/23/2020  What Year? 0 points  What month? 0 points  What time? 0 points  Count back from 20 0 points  Months in reverse 0 points    Repeat phrase 0 points  Total Score 0    Immunizations Immunization History  Administered Date(s) Administered   Fluad Quad(high Dose 65+) 03/26/2019, 04/23/2020   Influenza Whole 05/02/2012, 05/02/2013   Influenza, High Dose Seasonal PF 04/11/2014, 04/12/2016, 04/13/2017   Influenza-Unspecified 04/27/2015, 05/12/2018   Pneumococcal Conjugate-13 04/11/2015   Pneumococcal Polysaccharide-23 03/17/2011   Td 02/20/2008   Tdap 06/26/2019   Zoster 08/02/2010   Zoster Recombinat (Shingrix) 09/08/2017, 11/24/2017    TDAP status: Up to date Flu Vaccine status: Up to date Pneumococcal vaccine status: Up to date Covid-19 vaccine status: Completed vaccines  Qualifies for Shingles Vaccine? Yes   Zostavax completed Yes   Shingrix Completed?: Yes  Screening Tests Health Maintenance  Topic Date Due   COVID-19 Vaccine (1) Never done   MAMMOGRAM  03/18/2021   COLONOSCOPY  10/16/2024   TETANUS/TDAP  06/25/2029   INFLUENZA VACCINE  Completed   DEXA SCAN  Completed   Hepatitis C Screening  Completed   PNA vac Low Risk Adult  Completed    Health Maintenance  Health Maintenance Due  Topic Date Due   COVID-19 Vaccine (1) Never done    Colorectal cancer screening: Completed 02/21/2019. Repeat every 10 years Mammogram status: Completed 03/24/2020. Repeat every year  Lung Cancer Screening: (Low Dose CT Chest recommended if Age 62-80 years, 30 pack-year currently smoking OR have quit w/in 15years.) does not qualify.   Lung Cancer Screening Referral: no  Additional Screening:  Hepatitis C Screening: does qualify; Completed yes  Vision Screening: Recommended annual ophthalmology exams for early detection of glaucoma and other disorders of the eye. Is the patient up to date with their annual eye exam?  Yes  Who is the provider or what is the name of the office in which the patient attends annual eye exams? Raynelle Fanning, MD  If pt is not established with a provider, would they like to  be referred to a provider to establish care? No .   Dental Screening: Recommended annual dental exams for proper oral hygiene  Community Resource Referral / Chronic Care Management: CRR required this visit?  No   CCM required this visit?  No      Plan:     I have personally reviewed and noted the following in the patient's chart:   Medical and social history Use of alcohol, tobacco or illicit drugs  Current medications and supplements Functional ability and status Nutritional status Physical activity Advanced directives List of other physicians Hospitalizations, surgeries, and ER visits in previous 12 months Vitals Screenings to include cognitive, depression, and falls Referrals and appointments  In addition, I have reviewed and discussed with patient certain preventive protocols, quality metrics, and best practice recommendations. A written personalized care plan for preventive services as well as general preventive health recommendations were provided to patient.     Sheral Flow, LPN   [id number]   Nurse Notes: n/a   Medical screening examination/treatment/procedure(s) were performed by non-physician practitioner and as supervising physician I was immediately available for consultation/collaboration.  I agree with above. Lew Dawes, MD

## 2020-04-23 NOTE — Assessment & Plan Note (Signed)
OA Ref to Dr Berenice Primas

## 2020-04-23 NOTE — Assessment & Plan Note (Signed)
MRI stable 7/21

## 2020-04-23 NOTE — Progress Notes (Signed)
Subjective:  Patient ID: Morgan Huff, female    DOB: May 27, 1946  Age: 74 y.o. MRN: [id number]  CC: No chief complaint on file.   HPI Morgan Huff presents for OA, hip pain, HTN. C/o LBP, posture issues BP ok at home   Outpatient Medications Prior to Visit  Medication Sig Dispense Refill   Ascorbic Acid (VITAMIN C) 500 MG CAPS Take 500 mg by mouth daily.      aspirin EC 81 MG tablet Take 1 tablet (81 mg total) by mouth 2 (two) times daily. 100 tablet 3   Biotin 5 MG TABS Take 5,000 mg by mouth daily. Actually 5000 mcg -1 capsule daily     calcium carbonate (TUMS - DOSED IN MG ELEMENTAL CALCIUM) 500 MG chewable tablet Chew 2 tablets by mouth daily.      cholecalciferol (VITAMIN D) 1000 units tablet Take 2 tablets (2,000 Units total) by mouth daily. 100 tablet 3   ibuprofen (ADVIL) 200 MG tablet Take 400 mg by mouth daily.     Multiple Vitamins-Minerals (CENTRUM SILVER PO) Take 1 tablet by mouth daily.       Polyethyl Glycol-Propyl Glycol (SYSTANE) 0.4-0.3 % SOLN Place 1-2 drops into both eyes 3 (three) times daily as needed (for dry eyes).      simvastatin (ZOCOR) 10 MG tablet Take 1 tablet (10 mg total) by mouth daily. 90 tablet 3   timolol (BETIMOL) 0.5 % ophthalmic solution Place 1 drop into the left eye daily.      cephALEXin (KEFLEX) 500 MG capsule Take 1 capsule (500 mg total) by mouth 4 (four) times daily. 12 capsule 0   No facility-administered medications prior to visit.    ROS: Review of Systems  Constitutional: Negative for activity change, appetite change, chills, fatigue and unexpected weight change.  HENT: Negative for congestion, mouth sores and sinus pressure.   Eyes: Negative for visual disturbance.  Respiratory: Negative for cough and chest tightness.   Gastrointestinal: Positive for constipation. Negative for abdominal pain and nausea.  Genitourinary: Negative for difficulty urinating, frequency and vaginal pain.  Musculoskeletal: Positive  for arthralgias, back pain and gait problem.  Skin: Negative for pallor and rash.  Neurological: Negative for dizziness, tremors, weakness, numbness and headaches.  Psychiatric/Behavioral: Negative for confusion and sleep disturbance.    Objective:  BP (!) 144/92    Pulse 93    Temp 98.4 F (36.9 C) (Oral)    Ht 6\' 2"  (1.88 m)    Wt 286 lb (129.7 kg)    SpO2 95%    BMI 36.72 kg/m   BP Readings from Last 3 Encounters:  04/23/20 (!) 144/92  10/22/19 (!) 166/90  07/23/19 (!) 144/88    Wt Readings from Last 3 Encounters:  04/23/20 286 lb (129.7 kg)  10/22/19 292 lb (132.5 kg)  07/23/19 288 lb (130.6 kg)    Physical Exam Constitutional:      General: She is not in acute distress.    Appearance: She is well-developed. She is obese.  HENT:     Head: Normocephalic.     Right Ear: External ear normal.     Left Ear: External ear normal.     Nose: Nose normal.  Eyes:     General:        Right eye: No discharge.        Left eye: No discharge.     Conjunctiva/sclera: Conjunctivae normal.     Pupils: Pupils are equal, round, and reactive to light.  Neck:     Thyroid: No thyromegaly.     Vascular: No JVD.     Trachea: No tracheal deviation.  Cardiovascular:     Rate and Rhythm: Normal rate and regular rhythm.     Heart sounds: Normal heart sounds.  Pulmonary:     Effort: No respiratory distress.     Breath sounds: No stridor. No wheezing.  Abdominal:     General: Bowel sounds are normal. There is no distension.     Palpations: Abdomen is soft. There is no mass.     Tenderness: There is no abdominal tenderness. There is no guarding or rebound.  Musculoskeletal:        General: Tenderness present.     Cervical back: Normal range of motion and neck supple.  Lymphadenopathy:     Cervical: No cervical adenopathy.  Skin:    Findings: No erythema or rash.  Neurological:     Mental Status: She is oriented to person, place, and time.     Cranial Nerves: No cranial nerve deficit.       Motor: No abnormal muscle tone.     Coordination: Coordination normal.     Deep Tendon Reflexes: Reflexes normal.  Psychiatric:        Behavior: Behavior normal.        Thought Content: Thought content normal.        Judgment: Judgment normal.   LS stiff  Lab Results  Component Value Date   WBC 9.0 10/22/2019   HGB 15.1 (H) 10/22/2019   HCT 45.1 10/22/2019   PLT 279.0 10/22/2019   GLUCOSE 114 (H) 10/22/2019   CHOL 154 10/22/2019   TRIG 127.0 10/22/2019   HDL 46.90 10/22/2019   LDLDIRECT 85.6 02/20/2008   LDLCALC 81 10/22/2019   ALT 17 10/22/2019   AST 19 10/22/2019   NA 139 10/22/2019   K 4.6 10/22/2019   CL 105 10/22/2019   CREATININE 0.88 10/22/2019   BUN 23 10/22/2019   CO2 29 10/22/2019   TSH 2.36 10/22/2019   INR 1.00 09/19/2018   HGBA1C 6.3 10/22/2019   MICROALBUR 0.7 04/12/2016    MR ABDOMEN WWO CONTRAST  Result Date: 02/22/2020 CLINICAL DATA:  Surveillance imaging of indeterminate segment 7 lesion in the liver EXAM: MRI ABDOMEN WITHOUT AND WITH CONTRAST TECHNIQUE: Multiplanar multisequence MR imaging of the abdomen was performed both before and after the administration of intravenous contrast. CONTRAST:  66mL MULTIHANCE GADOBENATE DIMEGLUMINE 529 MG/ML IV SOLN COMPARISON:  07/18/2019 FINDINGS: Body habitus reduces diagnostic sensitivity and specificity. Lower chest: Unremarkable Hepatobiliary: Cholecystectomy. Common hepatic duct 0.9 cm in diameter, common bile duct 0.8 cm in diameter. No definite filling defect or abnormal enhancement along the biliary tree. The lesion in segment 7 is partially obscured by motion artifact. There is apparent transient hepatic attenuation difference peripheral to this lesion, which demonstrates arterial phase enhancement near the apex of the fat, but becomes increasingly isointense to the surrounding liver on later phases similar to the prior exam. No significant restricted diffusion in this vicinity. No significant appreciable  abnormality on precontrast T1 or T2 weighted images. Pancreas: Stable dilated dorsal pancreatic duct at 0.6 cm, dilated ventral pancreatic duct at 0.4 cm. Spleen:  Unremarkable Adrenals/Urinary Tract:  Unremarkable Stomach/Bowel: Scattered colonic diverticula particularly in the transverse and descending colon. Vascular/Lymphatic:  Unremarkable Other:  No supplemental non-categorized findings. Musculoskeletal: Lumbar spondylosis and degenerative disc disease. IMPRESSION: 1. Transient hepatic attenuation difference in segment 7 of the liver appears unchanged. Along the medial  margin of this process, there could conceivably be a small vascular malformation, atypical hemangioma, or focal nodular hyperplasia contributing, but no signal abnormalities visible in this region on T2 or precontrast T1 weighted images. The lack of progression is further reassuring. If the patient has a history of malignancy, consider a follow up MRI in 1 years time using Eovist contrast medium. 2. Stable mild extrahepatic and ventral pancreatic duct dilatation. 3. Lumbar spondylosis and degenerative disc disease. 4. Scattered colonic diverticula. Electronically Signed   By: Van Clines M.D.   On: 02/22/2020 10:11    Assessment & Plan:    Walker Kehr, MD

## 2020-04-23 NOTE — Addendum Note (Signed)
Addended by: Cresenciano Lick on: 04/23/2020 03:57 PM   Modules accepted: Orders

## 2020-04-23 NOTE — Assessment & Plan Note (Signed)
F/u w/Dr Berenice Primas PT

## 2020-04-23 NOTE — Assessment & Plan Note (Signed)
CBC

## 2020-04-24 LAB — CBC WITH DIFFERENTIAL/PLATELET
Absolute Monocytes: 939 cells/uL (ref 200–950)
Basophils Absolute: 51 cells/uL (ref 0–200)
Basophils Relative: 0.5 %
Eosinophils Absolute: 222 cells/uL (ref 15–500)
Eosinophils Relative: 2.2 %
HCT: 47.8 % — ABNORMAL HIGH (ref 35.0–45.0)
Hemoglobin: 16 g/dL — ABNORMAL HIGH (ref 11.7–15.5)
Lymphs Abs: 2515 cells/uL (ref 850–3900)
MCH: 30 pg (ref 27.0–33.0)
MCHC: 33.5 g/dL (ref 32.0–36.0)
MCV: 89.7 fL (ref 80.0–100.0)
MPV: 8.7 fL (ref 7.5–12.5)
Monocytes Relative: 9.3 %
Neutro Abs: 6373 cells/uL (ref 1500–7800)
Neutrophils Relative %: 63.1 %
Platelets: 290 10*3/uL (ref 140–400)
RBC: 5.33 10*6/uL — ABNORMAL HIGH (ref 3.80–5.10)
RDW: 12.8 % (ref 11.0–15.0)
Total Lymphocyte: 24.9 %
WBC: 10.1 10*3/uL (ref 3.8–10.8)

## 2020-04-24 LAB — BASIC METABOLIC PANEL
BUN: 22 mg/dL (ref 7–25)
CO2: 31 mmol/L (ref 20–32)
Calcium: 10 mg/dL (ref 8.6–10.4)
Chloride: 104 mmol/L (ref 98–110)
Creat: 0.86 mg/dL (ref 0.60–0.93)
Glucose, Bld: 97 mg/dL (ref 65–99)
Potassium: 4.2 mmol/L (ref 3.5–5.3)
Sodium: 140 mmol/L (ref 135–146)

## 2020-04-24 LAB — HEMOGLOBIN A1C
Hgb A1c MFr Bld: 6.1 % of total Hgb — ABNORMAL HIGH (ref <5.7)
Mean Plasma Glucose: 128 (calc)
eAG (mmol/L): 7.1 (calc)

## 2020-04-25 LAB — URINALYSIS
Bilirubin Urine: NEGATIVE
Glucose, UA: NEGATIVE
Hgb urine dipstick: NEGATIVE
Leukocytes,Ua: NEGATIVE
Nitrite: NEGATIVE
Specific Gravity, Urine: 1.028 (ref 1.001–1.03)
pH: 5.5 (ref 5.0–8.0)

## 2020-04-25 LAB — CULTURE, URINE COMPREHENSIVE

## 2020-05-13 DIAGNOSIS — Z01419 Encounter for gynecological examination (general) (routine) without abnormal findings: Secondary | ICD-10-CM | POA: Diagnosis not present

## 2020-05-13 DIAGNOSIS — Z6836 Body mass index (BMI) 36.0-36.9, adult: Secondary | ICD-10-CM | POA: Diagnosis not present

## 2020-05-29 DIAGNOSIS — M25551 Pain in right hip: Secondary | ICD-10-CM | POA: Diagnosis not present

## 2020-06-11 DIAGNOSIS — R531 Weakness: Secondary | ICD-10-CM | POA: Diagnosis not present

## 2020-06-11 DIAGNOSIS — M25651 Stiffness of right hip, not elsewhere classified: Secondary | ICD-10-CM | POA: Diagnosis not present

## 2020-06-24 DIAGNOSIS — M25651 Stiffness of right hip, not elsewhere classified: Secondary | ICD-10-CM | POA: Diagnosis not present

## 2020-06-24 DIAGNOSIS — R531 Weakness: Secondary | ICD-10-CM | POA: Diagnosis not present

## 2020-06-25 DIAGNOSIS — H401331 Pigmentary glaucoma, bilateral, mild stage: Secondary | ICD-10-CM | POA: Diagnosis not present

## 2020-07-01 DIAGNOSIS — M25651 Stiffness of right hip, not elsewhere classified: Secondary | ICD-10-CM | POA: Diagnosis not present

## 2020-07-01 DIAGNOSIS — R531 Weakness: Secondary | ICD-10-CM | POA: Diagnosis not present

## 2020-07-09 DIAGNOSIS — M25651 Stiffness of right hip, not elsewhere classified: Secondary | ICD-10-CM | POA: Diagnosis not present

## 2020-07-09 DIAGNOSIS — R531 Weakness: Secondary | ICD-10-CM | POA: Diagnosis not present

## 2020-07-16 DIAGNOSIS — M25651 Stiffness of right hip, not elsewhere classified: Secondary | ICD-10-CM | POA: Diagnosis not present

## 2020-07-16 DIAGNOSIS — R531 Weakness: Secondary | ICD-10-CM | POA: Diagnosis not present

## 2020-07-22 ENCOUNTER — Other Ambulatory Visit: Payer: Self-pay | Admitting: Internal Medicine

## 2020-07-23 DIAGNOSIS — L218 Other seborrheic dermatitis: Secondary | ICD-10-CM | POA: Diagnosis not present

## 2020-07-23 DIAGNOSIS — L821 Other seborrheic keratosis: Secondary | ICD-10-CM | POA: Diagnosis not present

## 2020-07-23 DIAGNOSIS — L905 Scar conditions and fibrosis of skin: Secondary | ICD-10-CM | POA: Diagnosis not present

## 2020-07-23 DIAGNOSIS — D225 Melanocytic nevi of trunk: Secondary | ICD-10-CM | POA: Diagnosis not present

## 2020-07-23 DIAGNOSIS — L723 Sebaceous cyst: Secondary | ICD-10-CM | POA: Diagnosis not present

## 2020-07-29 DIAGNOSIS — R531 Weakness: Secondary | ICD-10-CM | POA: Diagnosis not present

## 2020-07-29 DIAGNOSIS — M25651 Stiffness of right hip, not elsewhere classified: Secondary | ICD-10-CM | POA: Diagnosis not present

## 2020-08-05 DIAGNOSIS — M25651 Stiffness of right hip, not elsewhere classified: Secondary | ICD-10-CM | POA: Diagnosis not present

## 2020-08-05 DIAGNOSIS — R531 Weakness: Secondary | ICD-10-CM | POA: Diagnosis not present

## 2020-09-08 ENCOUNTER — Other Ambulatory Visit: Payer: Self-pay | Admitting: Internal Medicine

## 2020-09-08 MED ORDER — CEPHALEXIN 500 MG PO CAPS: 500.0000 mg | ORAL_CAPSULE | Freq: Four times a day (QID) | ORAL | 0 refills | Status: DC

## 2020-09-08 NOTE — Telephone Encounter (Signed)
Pt checking status on email...Morgan Huff

## 2020-09-18 ENCOUNTER — Other Ambulatory Visit: Payer: Self-pay | Admitting: Gastroenterology

## 2020-09-18 DIAGNOSIS — K769 Liver disease, unspecified: Secondary | ICD-10-CM

## 2020-09-19 DIAGNOSIS — R030 Elevated blood-pressure reading, without diagnosis of hypertension: Secondary | ICD-10-CM | POA: Diagnosis not present

## 2020-09-19 DIAGNOSIS — R350 Frequency of micturition: Secondary | ICD-10-CM | POA: Diagnosis not present

## 2020-09-19 DIAGNOSIS — N3001 Acute cystitis with hematuria: Secondary | ICD-10-CM | POA: Diagnosis not present

## 2020-09-26 DIAGNOSIS — N390 Urinary tract infection, site not specified: Secondary | ICD-10-CM | POA: Diagnosis not present

## 2020-10-21 ENCOUNTER — Other Ambulatory Visit: Payer: Self-pay

## 2020-10-22 ENCOUNTER — Other Ambulatory Visit: Payer: Self-pay

## 2020-10-22 ENCOUNTER — Ambulatory Visit (INDEPENDENT_AMBULATORY_CARE_PROVIDER_SITE_OTHER): Payer: Medicare Other | Admitting: Internal Medicine

## 2020-10-22 ENCOUNTER — Encounter: Payer: Self-pay | Admitting: Internal Medicine

## 2020-10-22 ENCOUNTER — Telehealth: Payer: Self-pay | Admitting: *Deleted

## 2020-10-22 DIAGNOSIS — E785 Hyperlipidemia, unspecified: Secondary | ICD-10-CM | POA: Diagnosis not present

## 2020-10-22 DIAGNOSIS — R413 Other amnesia: Secondary | ICD-10-CM

## 2020-10-22 DIAGNOSIS — M160 Bilateral primary osteoarthritis of hip: Secondary | ICD-10-CM

## 2020-10-22 DIAGNOSIS — R739 Hyperglycemia, unspecified: Secondary | ICD-10-CM

## 2020-10-22 DIAGNOSIS — R3915 Urgency of urination: Secondary | ICD-10-CM | POA: Diagnosis not present

## 2020-10-22 DIAGNOSIS — G3184 Mild cognitive impairment, so stated: Secondary | ICD-10-CM

## 2020-10-22 DIAGNOSIS — D751 Secondary polycythemia: Secondary | ICD-10-CM | POA: Diagnosis not present

## 2020-10-22 HISTORY — DX: Mild cognitive impairment of uncertain or unknown etiology: G31.84

## 2020-10-22 MED ORDER — OXYBUTYNIN CHLORIDE 5 MG PO TABS: 5.0000 mg | ORAL_TABLET | Freq: Three times a day (TID) | ORAL | 3 refills | Status: DC | PRN

## 2020-10-22 MED ORDER — CELECOXIB 200 MG PO CAPS: 200.0000 mg | ORAL_CAPSULE | Freq: Two times a day (BID) | ORAL | 3 refills | Status: DC

## 2020-10-22 MED ORDER — NAPROXEN 500 MG PO TABS: ORAL_TABLET | ORAL | 3 refills | Status: DC

## 2020-10-22 NOTE — Assessment & Plan Note (Signed)
Check A1c. 

## 2020-10-22 NOTE — Addendum Note (Signed)
Addended by: Boris Lown B on: 10/22/2020 04:17 PM   Modules accepted: Orders

## 2020-10-22 NOTE — Assessment & Plan Note (Signed)
Dr Corinna Capra ?[facility] Urol appt pending Trial of prn Ditropan

## 2020-10-22 NOTE — Progress Notes (Signed)
Subjective:  Patient ID: Morgan Huff, female    DOB: 29-Oct-1945  Age: 75 y.o. MRN: [id number]  CC: Follow-up (6 month f/u)   HPI Morgan Huff presents for R hip pain, stiffness C/o urinary urgency, frequency - UAs were nl, no UTI F/u dyslipidemia, CAD BP ok a home C/o poor focus  Outpatient Medications Prior to Visit  Medication Sig Dispense Refill  . Ascorbic Acid (VITAMIN C) 500 MG CAPS Take 500 mg by mouth daily.    Marland Kitchen aspirin 81 MG EC tablet Take 81 mg by mouth daily. Swallow whole.    . Biotin 5 MG TABS Take 5,000 mg by mouth daily. Actually 5000 mcg -1 capsule daily    . calcium carbonate (TUMS - DOSED IN MG ELEMENTAL CALCIUM) 500 MG chewable tablet Chew 2 tablets by mouth daily.     . cholecalciferol (VITAMIN D) 1000 units tablet Take 2 tablets (2,000 Units total) by mouth daily. 100 tablet 3  . ibuprofen (ADVIL) 200 MG tablet Take 400 mg by mouth daily.    Marland Kitchen latanoprost (XALATAN) 0.005 % ophthalmic solution Place 1 drop into the left eye at bedtime.    . Multiple Vitamins-Minerals (CENTRUM SILVER PO) Take 1 tablet by mouth daily.    Vladimir Faster Glycol-Propyl Glycol 0.4-0.3 % SOLN Place 1-2 drops into both eyes 3 (three) times daily as needed (for dry eyes).     . simvastatin (ZOCOR) 10 MG tablet TAKE 1 TABLET DAILY 90 tablet 3  . timolol (BETIMOL) 0.5 % ophthalmic solution Place 1 drop into the left eye daily.    . cephALEXin (KEFLEX) 500 MG capsule Take 1 capsule (500 mg total) by mouth 4 (four) times daily. 12 capsule 0   No facility-administered medications prior to visit.    ROS: Review of Systems  Constitutional: Negative for activity change, appetite change, chills, fatigue and unexpected weight change.  HENT: Negative for congestion, mouth sores and sinus pressure.   Eyes: Negative for visual disturbance.  Respiratory: Negative for cough and chest tightness.   Gastrointestinal: Negative for abdominal pain and nausea.  Genitourinary: Positive for  frequency and urgency. Negative for difficulty urinating and vaginal pain.  Musculoskeletal: Positive for arthralgias. Negative for back pain and gait problem.  Skin: Negative for pallor and rash.  Neurological: Negative for dizziness, tremors, weakness, numbness and headaches.  Psychiatric/Behavioral: Positive for decreased concentration. Negative for confusion and sleep disturbance.    Objective:  BP 140/88 (BP Location: Left Arm)   Pulse 80   Temp 98.2 F (36.8 C) (Oral)   Ht 6\' 2"  (1.88 m)   Wt 283 lb 6.4 oz (128.5 kg)   SpO2 95%   BMI 36.39 kg/m   BP Readings from Last 3 Encounters:  10/22/20 140/88  04/23/20 (!) 144/92  04/23/20 (!) 144/92    Wt Readings from Last 3 Encounters:  10/22/20 283 lb 6.4 oz (128.5 kg)  04/23/20 286 lb (129.7 kg)  04/23/20 286 lb (129.7 kg)    Physical Exam Constitutional:      General: She is not in acute distress.    Appearance: She is well-developed. She is obese.  HENT:     Head: Normocephalic.     Right Ear: External ear normal.     Left Ear: External ear normal.     Nose: Nose normal.  Eyes:     General:        Right eye: No discharge.        Left eye: No  discharge.     Conjunctiva/sclera: Conjunctivae normal.     Pupils: Pupils are equal, round, and reactive to light.  Neck:     Thyroid: No thyromegaly.     Vascular: No JVD.     Trachea: No tracheal deviation.  Cardiovascular:     Rate and Rhythm: Normal rate and regular rhythm.     Heart sounds: Normal heart sounds.  Pulmonary:     Effort: No respiratory distress.     Breath sounds: No stridor. No wheezing.  Abdominal:     General: Bowel sounds are normal. There is no distension.     Palpations: Abdomen is soft. There is no mass.     Tenderness: There is no abdominal tenderness. There is no guarding or rebound.  Musculoskeletal:        General: Tenderness present.     Cervical back: Normal range of motion and neck supple.  Lymphadenopathy:     Cervical: No  cervical adenopathy.  Skin:    Findings: No erythema or rash.  Neurological:     Mental Status: She is oriented to person, place, and time.     Cranial Nerves: No cranial nerve deficit.     Motor: No abnormal muscle tone.     Coordination: Coordination normal.     Deep Tendon Reflexes: Reflexes normal.  Psychiatric:        Behavior: Behavior normal.        Thought Content: Thought content normal.        Judgment: Judgment normal.   hips - stiff a little w/ROM B  Lab Results  Component Value Date   WBC 10.1 04/23/2020   HGB 16.0 (H) 04/23/2020   HCT 47.8 (H) 04/23/2020   PLT 290 04/23/2020   GLUCOSE 97 04/23/2020   CHOL 154 10/22/2019   TRIG 127.0 10/22/2019   HDL 46.90 10/22/2019   LDLDIRECT 85.6 02/20/2008   LDLCALC 81 10/22/2019   ALT 17 10/22/2019   AST 19 10/22/2019   NA 140 04/23/2020   K 4.2 04/23/2020   CL 104 04/23/2020   CREATININE 0.86 04/23/2020   BUN 22 04/23/2020   CO2 31 04/23/2020   TSH 2.36 10/22/2019   INR 1.00 09/19/2018   HGBA1C 6.1 (H) 04/23/2020   MICROALBUR 0.7 04/12/2016    MR ABDOMEN WWO CONTRAST  Result Date: 02/22/2020 CLINICAL DATA:  Surveillance imaging of indeterminate segment 7 lesion in the liver EXAM: MRI ABDOMEN WITHOUT AND WITH CONTRAST TECHNIQUE: Multiplanar multisequence MR imaging of the abdomen was performed both before and after the administration of intravenous contrast. CONTRAST:  12mL MULTIHANCE GADOBENATE DIMEGLUMINE 529 MG/ML IV SOLN COMPARISON:  07/18/2019 FINDINGS: Body habitus reduces diagnostic sensitivity and specificity. Lower chest: Unremarkable Hepatobiliary: Cholecystectomy. Common hepatic duct 0.9 cm in diameter, common bile duct 0.8 cm in diameter. No definite filling defect or abnormal enhancement along the biliary tree. The lesion in segment 7 is partially obscured by motion artifact. There is apparent transient hepatic attenuation difference peripheral to this lesion, which demonstrates arterial phase enhancement  near the apex of the fat, but becomes increasingly isointense to the surrounding liver on later phases similar to the prior exam. No significant restricted diffusion in this vicinity. No significant appreciable abnormality on precontrast T1 or T2 weighted images. Pancreas: Stable dilated dorsal pancreatic duct at 0.6 cm, dilated ventral pancreatic duct at 0.4 cm. Spleen:  Unremarkable Adrenals/Urinary Tract:  Unremarkable Stomach/Bowel: Scattered colonic diverticula particularly in the transverse and descending colon. Vascular/Lymphatic:  Unremarkable Other:  No  supplemental non-categorized findings. Musculoskeletal: Lumbar spondylosis and degenerative disc disease. IMPRESSION: 1. Transient hepatic attenuation difference in segment 7 of the liver appears unchanged. Along the medial margin of this process, there could conceivably be a small vascular malformation, atypical hemangioma, or focal nodular hyperplasia contributing, but no signal abnormalities visible in this region on T2 or precontrast T1 weighted images. The lack of progression is further reassuring. If the patient has a history of malignancy, consider a follow up MRI in 1 years time using Eovist contrast medium. 2. Stable mild extrahepatic and ventral pancreatic duct dilatation. 3. Lumbar spondylosis and degenerative disc disease. 4. Scattered colonic diverticula. Electronically Signed   By: Van Clines M.D.   On: 02/22/2020 10:11    Assessment & Plan:     Walker Kehr, MD

## 2020-10-22 NOTE — Telephone Encounter (Signed)
Pt states the Celebrex and oxybutynin is too expensive. She states both are over $150. Requesting cheaper alternative...Johny Chess

## 2020-10-22 NOTE — Assessment & Plan Note (Signed)
Mild Try [place] mane

## 2020-10-22 NOTE — Telephone Encounter (Signed)
I will replace Celebrex with naproxen. Oxybutynin is the cheapest.  Morgan Huff can check with her pharmacy for an alternative or use goodRx website to find a pharmacy with a cheaper price.  She can also check with her mail order pharmacy. Thanks

## 2020-10-22 NOTE — Assessment & Plan Note (Signed)
Not much better Try Celebrex D/c Advil

## 2020-10-22 NOTE — Patient Instructions (Signed)
You can try [place] Mushroom capsules for memory, focus, neuropathy ( [employer])    

## 2020-10-22 NOTE — Assessment & Plan Note (Signed)
CBC

## 2020-10-22 NOTE — Assessment & Plan Note (Signed)
Lipids 

## 2020-10-23 DIAGNOSIS — H401331 Pigmentary glaucoma, bilateral, mild stage: Secondary | ICD-10-CM | POA: Diagnosis not present

## 2020-10-23 LAB — CBC WITH DIFFERENTIAL/PLATELET
Basophils Absolute: 0.1 10*3/uL (ref 0.0–0.1)
Basophils Relative: 1.5 % (ref 0.0–3.0)
Eosinophils Absolute: 0.2 10*3/uL (ref 0.0–0.7)
Eosinophils Relative: 2.5 % (ref 0.0–5.0)
HCT: 42.9 % (ref 36.0–46.0)
Hemoglobin: 14.1 g/dL (ref 12.0–15.0)
Lymphocytes Relative: 26 % (ref 12.0–46.0)
Lymphs Abs: 2.3 10*3/uL (ref 0.7–4.0)
MCHC: 32.9 g/dL (ref 30.0–36.0)
MCV: 89.9 fl (ref 78.0–100.0)
Monocytes Absolute: 1.1 10*3/uL — ABNORMAL HIGH (ref 0.1–1.0)
Monocytes Relative: 11.9 % (ref 3.0–12.0)
Neutro Abs: 5.2 10*3/uL (ref 1.4–7.7)
Neutrophils Relative %: 58.1 % (ref 43.0–77.0)
Platelets: 282 10*3/uL (ref 150.0–400.0)
RBC: 4.77 Mil/uL (ref 3.87–5.11)
RDW: 14 % (ref 11.5–15.5)
WBC: 8.9 10*3/uL (ref 4.0–10.5)

## 2020-10-23 LAB — LIPID PANEL
Cholesterol: 157 mg/dL (ref 0–200)
HDL: 44.9 mg/dL (ref >39.00)
LDL Cholesterol: 88 mg/dL (ref 0–99)
NonHDL: 112.22
Total CHOL/HDL Ratio: 3
Triglycerides: 121 mg/dL (ref 0.0–149.0)
VLDL: 24.2 mg/dL (ref 0.0–40.0)

## 2020-10-23 LAB — COMPREHENSIVE METABOLIC PANEL
ALT: 14 U/L (ref 0–35)
AST: 18 U/L (ref 0–37)
Albumin: 4.1 g/dL (ref 3.5–5.2)
Alkaline Phosphatase: 64 U/L (ref 39–117)
BUN: 23 mg/dL (ref 6–23)
CO2: 29 mEq/L (ref 19–32)
Calcium: 9.6 mg/dL (ref 8.4–10.5)
Chloride: 105 mEq/L (ref 96–112)
Creatinine, Ser: 0.82 mg/dL (ref 0.40–1.20)
GFR: 70.18 mL/min (ref >60.00)
Glucose, Bld: 88 mg/dL (ref 70–99)
Potassium: 4.2 mEq/L (ref 3.5–5.1)
Sodium: 141 mEq/L (ref 135–145)
Total Bilirubin: 0.5 mg/dL (ref 0.2–1.2)
Total Protein: 6.8 g/dL (ref 6.0–8.3)

## 2020-10-23 LAB — HEMOGLOBIN A1C: Hgb A1c MFr Bld: 6.4 % (ref 4.6–6.5)

## 2020-10-23 LAB — TSH: TSH: 2.37 u[IU]/mL (ref 0.35–4.50)

## 2020-10-23 NOTE — Telephone Encounter (Signed)
Notified pt w/MD response.../lmb 

## 2020-11-18 DIAGNOSIS — R351 Nocturia: Secondary | ICD-10-CM | POA: Diagnosis not present

## 2020-11-18 DIAGNOSIS — R350 Frequency of micturition: Secondary | ICD-10-CM | POA: Diagnosis not present

## 2020-12-18 ENCOUNTER — Other Ambulatory Visit: Payer: Self-pay

## 2020-12-18 ENCOUNTER — Other Ambulatory Visit (HOSPITAL_BASED_OUTPATIENT_CLINIC_OR_DEPARTMENT_OTHER): Payer: Self-pay

## 2020-12-18 ENCOUNTER — Ambulatory Visit: Payer: Medicare Other | Attending: Internal Medicine

## 2020-12-18 ENCOUNTER — Ambulatory Visit: Payer: Self-pay

## 2020-12-18 DIAGNOSIS — Z23 Encounter for immunization: Secondary | ICD-10-CM

## 2020-12-18 MED ORDER — COVID-19 MRNA VACC (MODERNA) 100 MCG/0.5ML IM SUSP
INTRAMUSCULAR | 0 refills | Status: DC
  Filled 2020-12-18: qty 0.25, 1d supply, fill #0

## 2020-12-18 NOTE — Progress Notes (Signed)
   Covid-19 Vaccination Clinic  Name:  Morgan Huff    MRN: [id number] DOB: [birth year]  12/18/2020  Morgan Huff was observed post Covid-19 immunization for 15 minutes without incident. She was provided with Vaccine Information Sheet and instruction to access the V-Safe system.   Morgan Huff was instructed to call 911 with any severe reactions post vaccine: Marland Kitchen Difficulty breathing  . Swelling of face and throat  . A fast heartbeat  . A bad rash all over body  . Dizziness and weakness   Immunizations Administered    Name Date Dose VIS Date Route   Moderna Covid-19 Booster Vaccine 12/18/2020  2:24 PM 0.25 mL 05/21/2020 Intramuscular   Manufacturer: Moderna   Lot: [id number]   [place]: 04599-774-14

## 2020-12-24 DIAGNOSIS — R350 Frequency of micturition: Secondary | ICD-10-CM | POA: Diagnosis not present

## 2020-12-24 DIAGNOSIS — R351 Nocturia: Secondary | ICD-10-CM | POA: Diagnosis not present

## 2021-02-03 DIAGNOSIS — R351 Nocturia: Secondary | ICD-10-CM | POA: Diagnosis not present

## 2021-02-03 DIAGNOSIS — R350 Frequency of micturition: Secondary | ICD-10-CM | POA: Diagnosis not present

## 2021-02-26 DIAGNOSIS — H401331 Pigmentary glaucoma, bilateral, mild stage: Secondary | ICD-10-CM | POA: Diagnosis not present

## 2021-03-10 DIAGNOSIS — N390 Urinary tract infection, site not specified: Secondary | ICD-10-CM | POA: Diagnosis not present

## 2021-03-10 DIAGNOSIS — R350 Frequency of micturition: Secondary | ICD-10-CM | POA: Diagnosis not present

## 2021-03-10 DIAGNOSIS — R351 Nocturia: Secondary | ICD-10-CM | POA: Diagnosis not present

## 2021-03-30 ENCOUNTER — Encounter: Payer: Self-pay | Admitting: Internal Medicine

## 2021-03-30 DIAGNOSIS — Z1231 Encounter for screening mammogram for malignant neoplasm of breast: Secondary | ICD-10-CM | POA: Diagnosis not present

## 2021-03-30 LAB — HM MAMMOGRAPHY

## 2021-04-08 ENCOUNTER — Encounter: Payer: Self-pay | Admitting: Internal Medicine

## 2021-04-13 IMAGING — MR MR ABDOMEN WO/W CM
17 series · 48 of 48 positions shown · IV contrast (20ml Multihance)
Comparison: 07/18/2019

CLINICAL DATA: Surveillance imaging of indeterminate segment 7
lesion in the liver

EXAM:
MRI ABDOMEN WITHOUT AND WITH CONTRAST
TECHNIQUE: Multiplanar multisequence MR imaging of the abdomen was performed
both before and after the administration of intravenous contrast.
CONTRAST:  20mL MULTIHANCE GADOBENATE DIMEGLUMINE 529 MG/ML IV SOLN

[Series 3: T2 · coronal · 5.0mm · 1.56mm/px · 1 of 39 slices shown (1 of 3)]
[im 1/39]
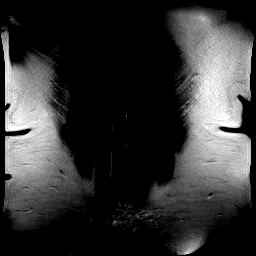

[Series 4: T1 · axial · 3.2mm · 1.31mm/px · z∈[-84,+168]mm · 5 of 160 slices shown]
[im 1/160]
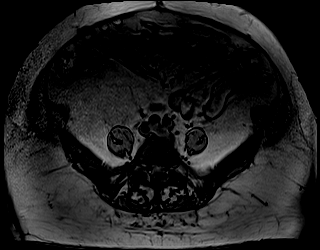
[im 40/160]
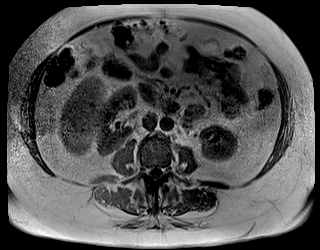
[im 80/160]
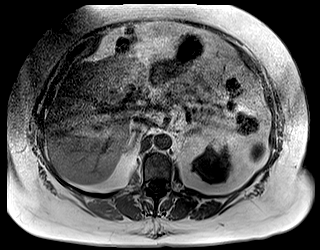
[im 120/160]
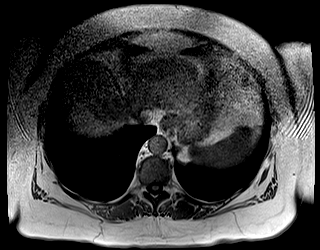
[im 160/160]
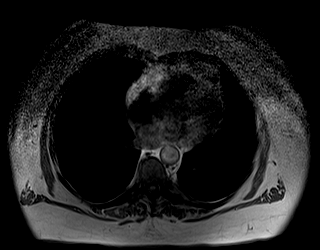

[Series 5: T2 · axial · 5.0mm · 1.64mm/px · z∈[-67,+197]mm · 2 of 45 slices shown (2 of 3)]
[im 1/45]
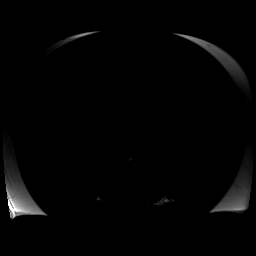
[im 45/45]
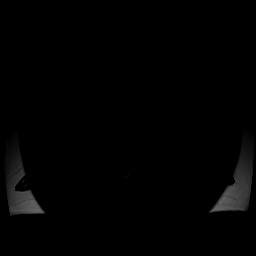

[Series 6: DWI · axial · 5.0mm · 1.57mm/px · z∈[-67,+197]mm · 5 of 135 slices shown (1 of 2)]
[im 1/135]
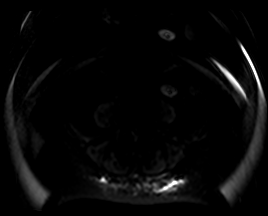
[im 34/135]
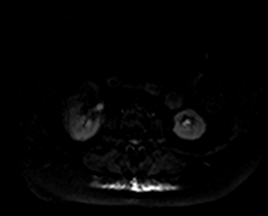
[im 68/135]
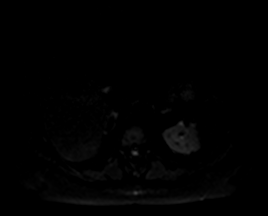
[im 101/135]
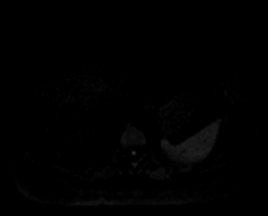
[im 135/135]
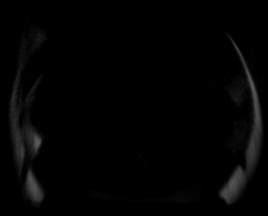

[Series 7: DWI · axial · 5.0mm · 1.57mm/px · z∈[-67,+197]mm · 2 of 45 slices shown (2 of 2)]
[im 1/45]
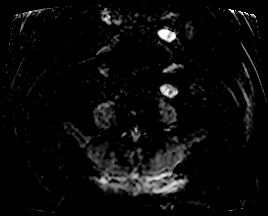
[im 45/45]
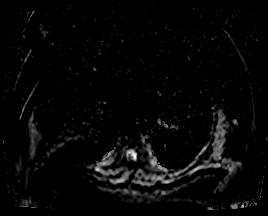

[Series 8: T2 · axial · 6.0mm · 1.31mm/px · 1 of 37 slices shown (3 of 3)]
[im 1/37]
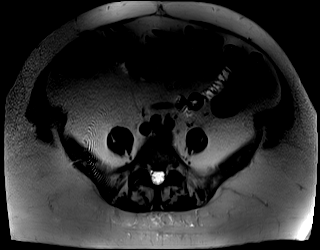

[Series 9: bSSFP · axial · 5.0mm · 1.31mm/px · z∈[-90,+174]mm · 2 of 45 slices shown]
[im 1/45]
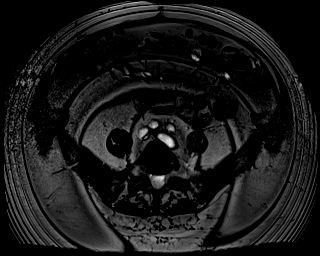
[im 45/45]
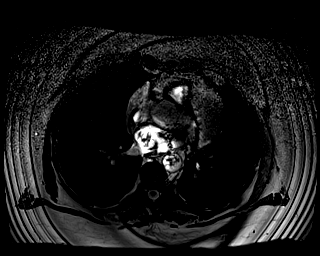

[Series 11: T1 dynamic · axial · non-contrast · 3.0mm · 1.31mm/px · z∈[-89,+172]mm · 3 of 88 slices shown]
[im 1/88]
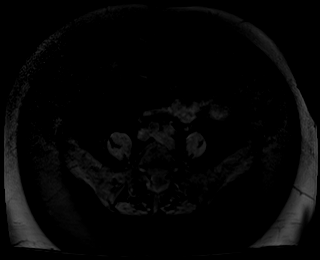
[im 44/88]
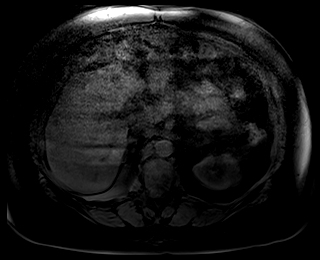
[im 88/88]
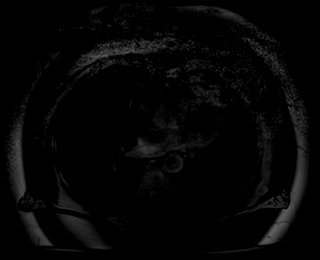

[Series 12: T1 dynamic post-contrast · axial · 3.0mm · 1.31mm/px · z∈[-89,+172]mm · 3 of 88 slices shown (1 of 5)]
[im 1/88]
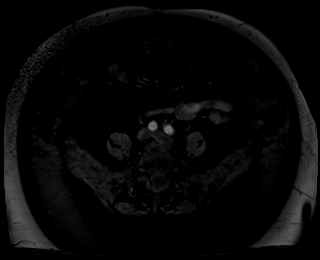
[im 44/88]
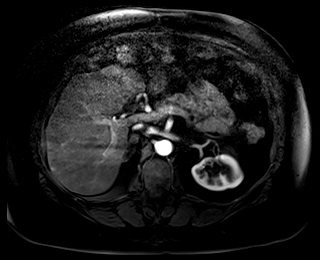
[im 88/88]
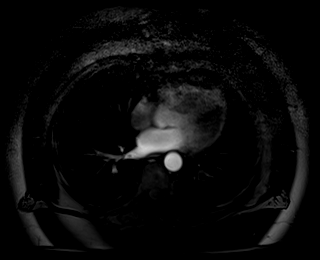

[Series 13: T1 dynamic post-contrast · axial · 3.0mm · 1.31mm/px · z∈[-89,+172]mm · 3 of 88 slices shown (2 of 5)]
[im 1/88]
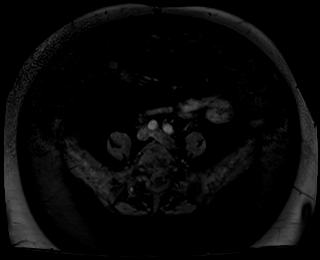
[im 44/88]
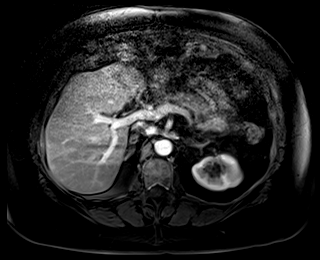
[im 88/88]
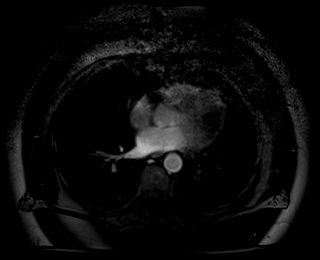

[Series 14: T1 dynamic post-contrast · axial · 3.0mm · 1.31mm/px · z∈[-89,+172]mm · 3 of 88 slices shown (3 of 5)]
[im 1/88]
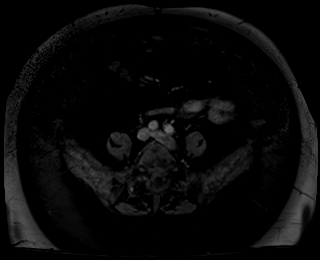
[im 44/88]
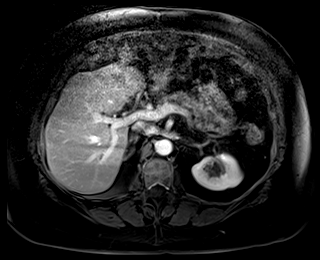
[im 88/88]
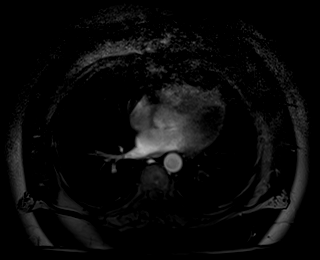

[Series 15: T1 dynamic post-contrast · coronal · 3.0mm · 1.25mm/px · 3 of 80 slices shown (4 of 5)]
[im 1/80]
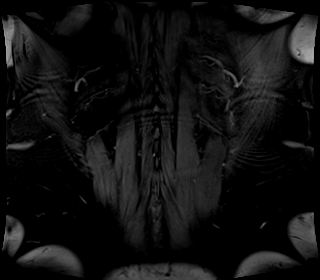
[im 40/80]
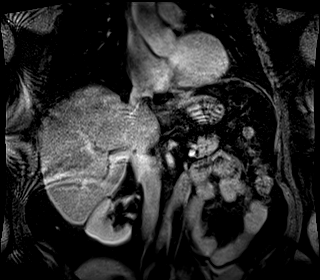
[im 80/80]
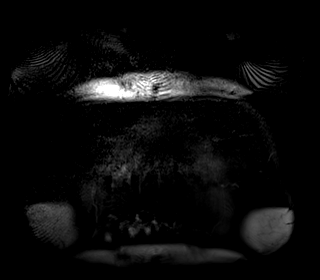

[Series 16: T1 dynamic post-contrast · axial · 3.0mm · 1.31mm/px · z∈[-89,+172]mm · 3 of 88 slices shown (5 of 5)]
[im 1/88]
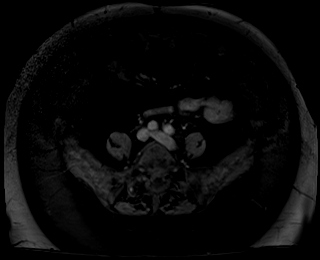
[im 44/88]
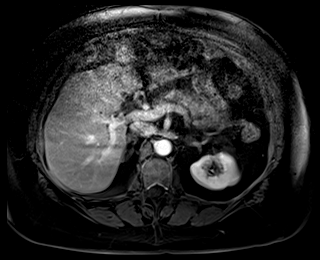
[im 88/88]
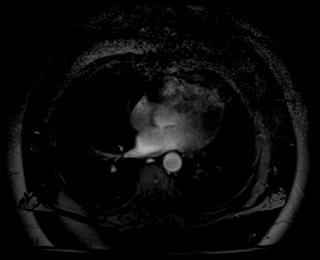

[Series 100: sub_25 sec · axial · 3.0mm · 1.31mm/px · z∈[-89,+172]mm · 3 of 88 slices shown]
[im 1/88]
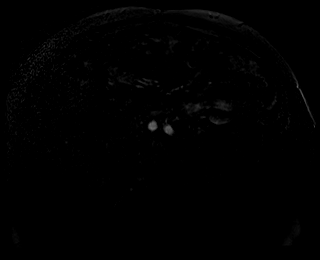
[im 44/88]
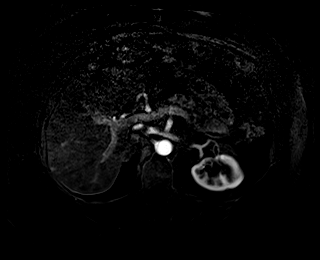
[im 88/88]
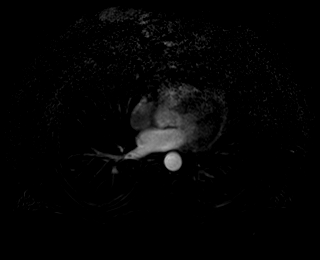

[Series 101: sub_45 sec · axial · 3.0mm · 1.31mm/px · z∈[-89,+172]mm · 3 of 88 slices shown]
[im 1/88]
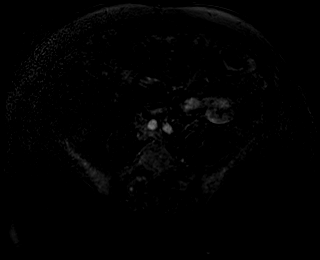
[im 44/88]
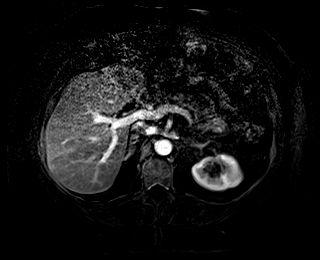
[im 88/88]
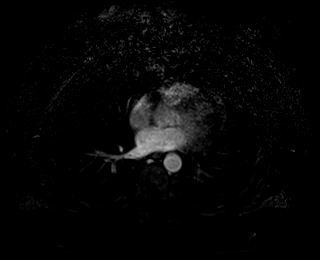

[Series 102: sub_90 sec · axial · 3.0mm · 1.31mm/px · z∈[-89,+172]mm · 3 of 88 slices shown]
[im 1/88]
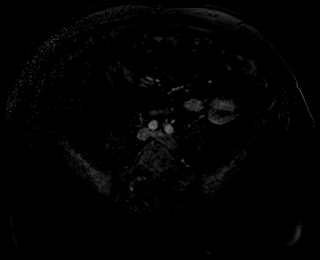
[im 44/88]
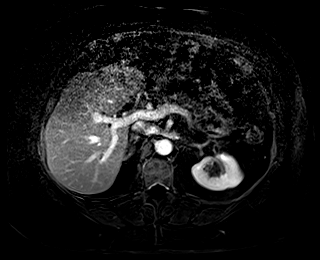
[im 88/88]
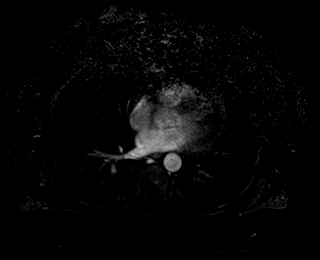

[Series 103: sub_3 min · axial · 3.0mm · 1.31mm/px · z∈[-89,+172]mm · 3 of 88 slices shown]
[im 1/88]
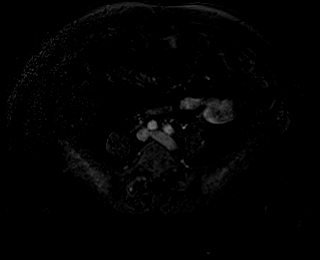
[im 44/88]
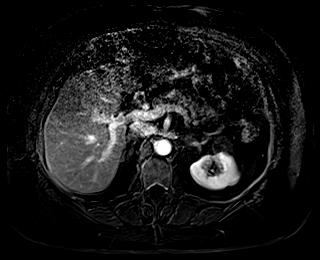
[im 88/88]
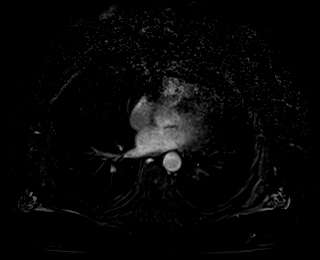

[48 of 48 positions shown; findings below may reference images not displayed]

FINDINGS: Body habitus reduces diagnostic sensitivity and specificity.

Lower chest: Unremarkable

Hepatobiliary: Cholecystectomy. Common hepatic duct 0.9 cm in
diameter, common bile duct 0.8 cm in diameter. No definite filling
defect or abnormal enhancement along the biliary tree.

The lesion in segment 7 is partially obscured by motion artifact.
There is apparent transient hepatic attenuation difference
peripheral to this lesion, which demonstrates arterial phase
enhancement near the apex of the fat, but becomes increasingly
isointense to the surrounding liver on later phases similar to the
prior exam. No significant restricted diffusion in this vicinity. No
significant appreciable abnormality on precontrast T1 or T2 weighted
images.

Pancreas: Stable dilated dorsal pancreatic duct at 0.6 cm, dilated
ventral pancreatic duct at 0.4 cm.

Spleen:  Unremarkable

Adrenals/Urinary Tract:  Unremarkable

Stomach/Bowel: Scattered colonic diverticula particularly in the
transverse and descending colon.

Vascular/Lymphatic:  Unremarkable

Other:  No supplemental non-categorized findings.

Musculoskeletal: Lumbar spondylosis and degenerative disc disease.
IMPRESSION: 1. Transient hepatic attenuation difference in segment 7 of the
liver appears unchanged. Along the medial margin of this process,
there could conceivably be a small vascular malformation, atypical
hemangioma, or focal nodular hyperplasia contributing, but no signal
abnormalities visible in this region on T2 or precontrast T1
weighted images. The lack of progression is further reassuring. If
the patient has a history of malignancy, consider a follow up MRI in
1 years time using Eovist contrast medium.
2. Stable mild extrahepatic and ventral pancreatic duct dilatation.
3. Lumbar spondylosis and degenerative disc disease.
4. Scattered colonic diverticula.

## 2021-04-29 ENCOUNTER — Encounter: Payer: Self-pay | Admitting: Internal Medicine

## 2021-04-29 ENCOUNTER — Ambulatory Visit (INDEPENDENT_AMBULATORY_CARE_PROVIDER_SITE_OTHER): Payer: Medicare Other | Admitting: Internal Medicine

## 2021-04-29 ENCOUNTER — Other Ambulatory Visit: Payer: Self-pay

## 2021-04-29 VITALS — BP 142/90 | Temp 98.6°F | Ht 74.0 in | Wt 269.8 lb

## 2021-04-29 DIAGNOSIS — Z23 Encounter for immunization: Secondary | ICD-10-CM

## 2021-04-29 DIAGNOSIS — M160 Bilateral primary osteoarthritis of hip: Secondary | ICD-10-CM | POA: Diagnosis not present

## 2021-04-29 DIAGNOSIS — R3915 Urgency of urination: Secondary | ICD-10-CM

## 2021-04-29 DIAGNOSIS — N952 Postmenopausal atrophic vaginitis: Secondary | ICD-10-CM | POA: Diagnosis not present

## 2021-04-29 DIAGNOSIS — I2583 Coronary atherosclerosis due to lipid rich plaque: Secondary | ICD-10-CM

## 2021-04-29 DIAGNOSIS — R413 Other amnesia: Secondary | ICD-10-CM

## 2021-04-29 DIAGNOSIS — I2510 Atherosclerotic heart disease of native coronary artery without angina pectoris: Secondary | ICD-10-CM

## 2021-04-29 HISTORY — DX: Postmenopausal atrophic vaginitis: N95.2

## 2021-04-29 HISTORY — DX: Atherosclerotic heart disease of native coronary artery without angina pectoris: I25.10

## 2021-04-29 MED ORDER — ESTRADIOL 0.1 MG/GM VA CREA: 1.0000 | TOPICAL_CREAM | VAGINAL | 3 refills | Status: DC

## 2021-04-29 NOTE — Assessment & Plan Note (Signed)
L THR - pending some day

## 2021-04-29 NOTE — Assessment & Plan Note (Signed)
CT coronary calcium score of 302.  Cont w/Simvastatin, ASA

## 2021-04-29 NOTE — Assessment & Plan Note (Signed)
  Try estrocream vag 3/wk PV

## 2021-04-29 NOTE — Assessment & Plan Note (Addendum)
F/u w/Dr McDiarmid Ditropan po On Macrobid qd Try estrocream vag 3/wk PV Memory loss is worse.  Likely aggravated by daily use of Ditropan.  She can switch to as needed use.

## 2021-04-29 NOTE — Progress Notes (Signed)
Subjective:  Patient ID: Morgan Huff, female    DOB: 1946-05-09  Age: 75 y.o. MRN: [id number]  CC: Follow-up (6 month f/u - Flu shot)   HPI Morgan Huff presents for frequent UTIs, OAB sx's - seeing dr McDiarmid. Now is on Macrobid and Oxybutinine  F/u OA  Outpatient Medications Prior to Visit  Medication Sig Dispense Refill   Ascorbic Acid (VITAMIN C) 500 MG CAPS Take 500 mg by mouth daily.     aspirin 81 MG EC tablet Take 81 mg by mouth daily. Swallow whole.     Biotin 5 MG TABS Take 5,000 mg by mouth daily. Actually 5000 mcg -1 capsule daily     calcium carbonate (TUMS - DOSED IN MG ELEMENTAL CALCIUM) 500 MG chewable tablet Chew 2 tablets by mouth daily.      cholecalciferol (VITAMIN D) 1000 units tablet Take 2 tablets (2,000 Units total) by mouth daily. 100 tablet 3   dorzolamide-timolol (COSOPT) 22.3-6.8 MG/ML ophthalmic solution Apply 1 drop in both eyes everyday     latanoprost (XALATAN) 0.005 % ophthalmic solution Place 1 drop into the left eye at bedtime.     Multiple Vitamins-Minerals (CENTRUM SILVER PO) Take 1 tablet by mouth daily.     naproxen (NAPROSYN) 500 MG tablet Take 1 tablet with food once or twice a day for pain/stiffness/arthritis 60 tablet 3   nitrofurantoin (MACRODANTIN) 100 MG capsule Take 100 mg by mouth daily.     oxybutynin (DITROPAN) 5 MG tablet Take 1 tablet (5 mg total) by mouth every 8 (eight) hours as needed for bladder spasms. 90 tablet 3   Polyethyl Glycol-Propyl Glycol 0.4-0.3 % SOLN Place 1-2 drops into both eyes 3 (three) times daily as needed (for dry eyes).      simvastatin (ZOCOR) 10 MG tablet TAKE 1 TABLET DAILY 90 tablet 3   COVID-19 mRNA vaccine, Moderna, 100 MCG/0.5ML injection Inject into the muscle. (Patient not taking: Reported on 04/29/2021) 0.25 mL 0   timolol (BETIMOL) 0.5 % ophthalmic solution Place 1 drop into the left eye daily. (Patient not taking: Reported on 04/29/2021)     No facility-administered medications prior to  visit.    ROS: Review of Systems  Constitutional:  Negative for activity change, appetite change, chills, fatigue and unexpected weight change.  HENT:  Negative for congestion, mouth sores and sinus pressure.   Eyes:  Negative for visual disturbance.  Respiratory:  Negative for cough and chest tightness.   Gastrointestinal:  Negative for abdominal pain and nausea.  Genitourinary:  Positive for frequency and urgency. Negative for difficulty urinating and vaginal pain.  Musculoskeletal:  Positive for gait problem. Negative for back pain.  Skin:  Negative for pallor and rash.  Neurological:  Negative for dizziness, tremors, weakness, numbness and headaches.  Psychiatric/Behavioral:  Positive for decreased concentration. Negative for confusion, dysphoric mood, sleep disturbance and suicidal ideas.   Antalgic gait Objective:  BP (!) 142/90 (BP Location: Left Arm)   Temp 98.6 F (37 C) (Oral)   Ht 6\' 2"  (1.88 m)   Wt 269 lb 12.8 oz (122.4 kg)   SpO2 95%   BMI 34.64 kg/m   BP Readings from Last 3 Encounters:  04/29/21 (!) 142/90  10/22/20 140/88  04/23/20 (!) 144/92    Wt Readings from Last 3 Encounters:  04/29/21 269 lb 12.8 oz (122.4 kg)  10/22/20 283 lb 6.4 oz (128.5 kg)  04/23/20 286 lb (129.7 kg)    Physical Exam Constitutional:  General: She is not in acute distress.    Appearance: She is well-developed.  HENT:     Head: Normocephalic.     Right Ear: External ear normal.     Left Ear: External ear normal.     Nose: Nose normal.  Eyes:     General:        Right eye: No discharge.        Left eye: No discharge.     Conjunctiva/sclera: Conjunctivae normal.     Pupils: Pupils are equal, round, and reactive to light.  Neck:     Thyroid: No thyromegaly.     Vascular: No JVD.     Trachea: No tracheal deviation.  Cardiovascular:     Rate and Rhythm: Normal rate and regular rhythm.     Heart sounds: Normal heart sounds.  Pulmonary:     Effort: No respiratory  distress.     Breath sounds: No stridor. No wheezing.  Abdominal:     General: Bowel sounds are normal. There is no distension.     Palpations: Abdomen is soft. There is no mass.     Tenderness: There is no abdominal tenderness. There is no guarding or rebound.  Musculoskeletal:        General: No tenderness.     Cervical back: Normal range of motion and neck supple. No rigidity.  Lymphadenopathy:     Cervical: No cervical adenopathy.  Skin:    Findings: No erythema or rash.  Neurological:     Mental Status: She is oriented to person, place, and time.     Cranial Nerves: No cranial nerve deficit.     Motor: No abnormal muscle tone.     Coordination: Coordination normal.     Deep Tendon Reflexes: Reflexes normal.  Psychiatric:        Behavior: Behavior normal.        Thought Content: Thought content normal.        Judgment: Judgment normal.    Lab Results  Component Value Date   WBC 8.9 10/22/2020   HGB 14.1 10/22/2020   HCT 42.9 10/22/2020   PLT 282.0 10/22/2020   GLUCOSE 88 10/22/2020   CHOL 157 10/22/2020   TRIG 121.0 10/22/2020   HDL 44.90 10/22/2020   LDLDIRECT 85.6 02/20/2008   LDLCALC 88 10/22/2020   ALT 14 10/22/2020   AST 18 10/22/2020   NA 141 10/22/2020   K 4.2 10/22/2020   CL 105 10/22/2020   CREATININE 0.82 10/22/2020   BUN 23 10/22/2020   CO2 29 10/22/2020   TSH 2.37 10/22/2020   INR 1.00 09/19/2018   HGBA1C 6.4 10/22/2020   MICROALBUR 0.7 04/12/2016    MR ABDOMEN WWO CONTRAST  Result Date: 02/22/2020 CLINICAL DATA:  Surveillance imaging of indeterminate segment 7 lesion in the liver EXAM: MRI ABDOMEN WITHOUT AND WITH CONTRAST TECHNIQUE: Multiplanar multisequence MR imaging of the abdomen was performed both before and after the administration of intravenous contrast. CONTRAST:  46mL MULTIHANCE GADOBENATE DIMEGLUMINE 529 MG/ML IV SOLN COMPARISON:  07/18/2019 FINDINGS: Body habitus reduces diagnostic sensitivity and specificity. Lower chest:  Unremarkable Hepatobiliary: Cholecystectomy. Common hepatic duct 0.9 cm in diameter, common bile duct 0.8 cm in diameter. No definite filling defect or abnormal enhancement along the biliary tree. The lesion in segment 7 is partially obscured by motion artifact. There is apparent transient hepatic attenuation difference peripheral to this lesion, which demonstrates arterial phase enhancement near the apex of the fat, but becomes increasingly isointense to the surrounding  liver on later phases similar to the prior exam. No significant restricted diffusion in this vicinity. No significant appreciable abnormality on precontrast T1 or T2 weighted images. Pancreas: Stable dilated dorsal pancreatic duct at 0.6 cm, dilated ventral pancreatic duct at 0.4 cm. Spleen:  Unremarkable Adrenals/Urinary Tract:  Unremarkable Stomach/Bowel: Scattered colonic diverticula particularly in the transverse and descending colon. Vascular/Lymphatic:  Unremarkable Other:  No supplemental non-categorized findings. Musculoskeletal: Lumbar spondylosis and degenerative disc disease. IMPRESSION: 1. Transient hepatic attenuation difference in segment 7 of the liver appears unchanged. Along the medial margin of this process, there could conceivably be a small vascular malformation, atypical hemangioma, or focal nodular hyperplasia contributing, but no signal abnormalities visible in this region on T2 or precontrast T1 weighted images. The lack of progression is further reassuring. If the patient has a history of malignancy, consider a follow up MRI in 1 years time using Eovist contrast medium. 2. Stable mild extrahepatic and ventral pancreatic duct dilatation. 3. Lumbar spondylosis and degenerative disc disease. 4. Scattered colonic diverticula. Electronically Signed   By: Van Clines M.D.   On: 02/22/2020 10:11    Assessment & Plan:   Problem List Items Addressed This Visit     Atrophic vaginitis     Try estrocream vag 3/wk PV       CAD (coronary artery disease)    CT coronary calcium score of 302.  Cont w/Simvastatin, ASA      Hip osteoarthritis    L THR - pending some day      Memory problem    Worse.  Likely aggravated by daily use of Ditropan.  She can switch to as needed use      Urinary urgency    F/u w/Dr McDiarmid Ditropan po On Macrobid qd Try estrocream vag 3/wk PV      Other Visit Diagnoses     Needs flu shot    -  Primary   Relevant Orders   Flu Vaccine QUAD High Dose(Fluad) (Completed)         Follow-up: Return in about 3 months (around 07/29/2021) for a follow-up visit.  Walker Kehr, MD

## 2021-05-09 NOTE — Assessment & Plan Note (Signed)
Worse.  Likely aggravated by daily use of Ditropan.  She can switch to as needed use

## 2021-05-18 DIAGNOSIS — H401331 Pigmentary glaucoma, bilateral, mild stage: Secondary | ICD-10-CM | POA: Diagnosis not present

## 2021-05-20 DIAGNOSIS — Z01419 Encounter for gynecological examination (general) (routine) without abnormal findings: Secondary | ICD-10-CM | POA: Diagnosis not present

## 2021-05-20 DIAGNOSIS — Z6834 Body mass index (BMI) 34.0-34.9, adult: Secondary | ICD-10-CM | POA: Diagnosis not present

## 2021-06-08 DIAGNOSIS — Z20822 Contact with and (suspected) exposure to covid-19: Secondary | ICD-10-CM | POA: Diagnosis not present

## 2021-06-15 DIAGNOSIS — N3941 Urge incontinence: Secondary | ICD-10-CM | POA: Diagnosis not present

## 2021-06-15 DIAGNOSIS — R350 Frequency of micturition: Secondary | ICD-10-CM | POA: Diagnosis not present

## 2021-07-29 ENCOUNTER — Ambulatory Visit (INDEPENDENT_AMBULATORY_CARE_PROVIDER_SITE_OTHER): Payer: Medicare Other | Admitting: Internal Medicine

## 2021-07-29 ENCOUNTER — Encounter: Payer: Self-pay | Admitting: Internal Medicine

## 2021-07-29 ENCOUNTER — Ambulatory Visit: Payer: Medicare Other

## 2021-07-29 ENCOUNTER — Other Ambulatory Visit: Payer: Self-pay

## 2021-07-29 DIAGNOSIS — M160 Bilateral primary osteoarthritis of hip: Secondary | ICD-10-CM | POA: Diagnosis not present

## 2021-07-29 DIAGNOSIS — N952 Postmenopausal atrophic vaginitis: Secondary | ICD-10-CM

## 2021-07-29 DIAGNOSIS — I2510 Atherosclerotic heart disease of native coronary artery without angina pectoris: Secondary | ICD-10-CM | POA: Diagnosis not present

## 2021-07-29 DIAGNOSIS — F419 Anxiety disorder, unspecified: Secondary | ICD-10-CM | POA: Diagnosis not present

## 2021-07-29 DIAGNOSIS — R3915 Urgency of urination: Secondary | ICD-10-CM | POA: Diagnosis not present

## 2021-07-29 DIAGNOSIS — I2583 Coronary atherosclerosis due to lipid rich plaque: Secondary | ICD-10-CM

## 2021-07-29 DIAGNOSIS — F411 Generalized anxiety disorder: Secondary | ICD-10-CM

## 2021-07-29 HISTORY — DX: Generalized anxiety disorder: F41.1

## 2021-07-29 MED ORDER — MIRABEGRON ER 50 MG PO TB24: 50.0000 mg | ORAL_TABLET | Freq: Every day | ORAL | 11 refills | Status: DC

## 2021-07-29 NOTE — Assessment & Plan Note (Signed)
on Estrocream PV

## 2021-07-29 NOTE — Assessment & Plan Note (Signed)
On Ibupofen prn Tramadol prn  Potential benefits of a long term opioids use as well as potential risks (i.e. addiction risk, apnea etc) and complications (i.e. Somnolence, constipation and others) were explained to the patient and were aknowledged. Not taking Celebrex

## 2021-07-29 NOTE — Assessment & Plan Note (Addendum)
Cont on Estrocream and Myrbetriq Discussed anxiety issues. Pt declined meds, declined ref to psychology, psychiatry... Try Valerian root for anxiety

## 2021-07-29 NOTE — Patient Instructions (Signed)
Try Valerian root for anxiety 

## 2021-07-29 NOTE — Progress Notes (Signed)
Subjective:  Patient ID: Morgan Huff, female    DOB: Jun 26, 1946  Age: 75 y.o. MRN: [id number]  CC: Follow-up (3 month follow up)   HPI Morgan Huff presents for OAB - seeing Dr Claudia Desanctis: on Estrocream and Myrbetriq C/o dry mouth - not better... F/u OA  Outpatient Medications Prior to Visit  Medication Sig Dispense Refill   Ascorbic Acid (VITAMIN C) 500 MG CAPS Take 500 mg by mouth daily.     aspirin 81 MG EC tablet Take 81 mg by mouth daily. Swallow whole.     Biotin 5 MG TABS Take 5,000 mg by mouth daily. Actually 5000 mcg -1 capsule daily     brimonidine-timolol (COMBIGAN) 0.2-0.5 % ophthalmic solution Apply to eye.     calcium carbonate (TUMS - DOSED IN MG ELEMENTAL CALCIUM) 500 MG chewable tablet Chew 2 tablets by mouth daily.      cholecalciferol (VITAMIN D) 1000 units tablet Take 2 tablets (2,000 Units total) by mouth daily. 100 tablet 3   diclofenac Sodium (VOLTAREN) 1 % GEL Apply topically as needed.     estradiol (ESTRACE VAGINAL) 0.1 MG/GM vaginal cream Place 1 Applicatorful vaginally 3 (three) times a week. 42.5 g 3   ibuprofen (ADVIL) 200 MG tablet Take 200 mg by mouth every 6 (six) hours as needed.     latanoprost (XALATAN) 0.005 % ophthalmic solution Place 1 drop into the left eye at bedtime.     Multiple Vitamins-Minerals (CENTRUM SILVER PO) Take 1 tablet by mouth daily.     naproxen (NAPROSYN) 500 MG tablet Take 1 tablet with food once or twice a day for pain/stiffness/arthritis 60 tablet 3   Polyethyl Glycol-Propyl Glycol 0.4-0.3 % SOLN Place 1-2 drops into both eyes 3 (three) times daily as needed (for dry eyes).      simvastatin (ZOCOR) 10 MG tablet TAKE 1 TABLET DAILY 90 tablet 3   traMADol (ULTRAM) 50 MG tablet Take by mouth every 8 (eight) hours as needed.     dorzolamide-timolol (COSOPT) 22.3-6.8 MG/ML ophthalmic solution Apply 1 drop in both eyes everyday     nitrofurantoin (MACRODANTIN) 100 MG capsule Take 100 mg by mouth daily.     oxybutynin  (DITROPAN) 5 MG tablet Take 1 tablet (5 mg total) by mouth every 8 (eight) hours as needed for bladder spasms. 90 tablet 3   No facility-administered medications prior to visit.    ROS: Review of Systems  Constitutional:  Negative for activity change, appetite change, chills, fatigue and unexpected weight change.  HENT:  Negative for congestion, mouth sores and sinus pressure.   Eyes:  Negative for visual disturbance.  Respiratory:  Negative for cough and chest tightness.   Gastrointestinal:  Negative for abdominal pain and nausea.  Genitourinary:  Positive for urgency. Negative for difficulty urinating, frequency and vaginal pain.  Musculoskeletal:  Positive for back pain. Negative for gait problem.  Skin:  Negative for pallor and rash.  Neurological:  Negative for dizziness, tremors, weakness, numbness and headaches.  Psychiatric/Behavioral:  Negative for confusion, sleep disturbance and suicidal ideas. The patient is nervous/anxious.    Objective:  BP 138/84 (BP Location: Left Arm, Patient Position: Sitting, Cuff Size: Large)    Pulse 71    Temp 98.1 F (36.7 C) (Oral)    Ht 6\' 2"  (1.88 m)    Wt 263 lb (119.3 kg)    SpO2 98%    BMI 33.77 kg/m   BP Readings from Last 3 Encounters:  07/29/21 138/84  04/29/21 (!) 142/90  10/22/20 140/88    Wt Readings from Last 3 Encounters:  07/29/21 263 lb (119.3 kg)  04/29/21 269 lb 12.8 oz (122.4 kg)  10/22/20 283 lb 6.4 oz (128.5 kg)    Physical Exam Constitutional:      General: She is not in acute distress.    Appearance: She is well-developed.  HENT:     Head: Normocephalic.     Right Ear: External ear normal.     Left Ear: External ear normal.     Nose: Nose normal.  Eyes:     General:        Right eye: No discharge.        Left eye: No discharge.     Conjunctiva/sclera: Conjunctivae normal.     Pupils: Pupils are equal, round, and reactive to light.  Neck:     Thyroid: No thyromegaly.     Vascular: No JVD.     Trachea:  No tracheal deviation.  Cardiovascular:     Rate and Rhythm: Normal rate and regular rhythm.     Heart sounds: Normal heart sounds.  Pulmonary:     Effort: No respiratory distress.     Breath sounds: No stridor. No wheezing.  Abdominal:     General: Bowel sounds are normal. There is no distension.     Palpations: Abdomen is soft. There is no mass.     Tenderness: There is no abdominal tenderness. There is no guarding or rebound.  Musculoskeletal:        General: No tenderness.     Cervical back: Normal range of motion and neck supple. No rigidity.  Lymphadenopathy:     Cervical: No cervical adenopathy.  Skin:    Findings: No erythema or rash.  Neurological:     Cranial Nerves: No cranial nerve deficit.     Motor: No abnormal muscle tone.     Coordination: Coordination normal.     Deep Tendon Reflexes: Reflexes normal.  Psychiatric:        Behavior: Behavior normal.        Thought Content: Thought content normal.        Judgment: Judgment normal.    Lab Results  Component Value Date   WBC 8.9 10/22/2020   HGB 14.1 10/22/2020   HCT 42.9 10/22/2020   PLT 282.0 10/22/2020   GLUCOSE 88 10/22/2020   CHOL 157 10/22/2020   TRIG 121.0 10/22/2020   HDL 44.90 10/22/2020   LDLDIRECT 85.6 02/20/2008   LDLCALC 88 10/22/2020   ALT 14 10/22/2020   AST 18 10/22/2020   NA 141 10/22/2020   K 4.2 10/22/2020   CL 105 10/22/2020   CREATININE 0.82 10/22/2020   BUN 23 10/22/2020   CO2 29 10/22/2020   TSH 2.37 10/22/2020   INR 1.00 09/19/2018   HGBA1C 6.4 10/22/2020   MICROALBUR 0.7 04/12/2016    MR ABDOMEN WWO CONTRAST  Result Date: 02/22/2020 CLINICAL DATA:  Surveillance imaging of indeterminate segment 7 lesion in the liver EXAM: MRI ABDOMEN WITHOUT AND WITH CONTRAST TECHNIQUE: Multiplanar multisequence MR imaging of the abdomen was performed both before and after the administration of intravenous contrast. CONTRAST:  4mL MULTIHANCE GADOBENATE DIMEGLUMINE 529 MG/ML IV SOLN  COMPARISON:  07/18/2019 FINDINGS: Body habitus reduces diagnostic sensitivity and specificity. Lower chest: Unremarkable Hepatobiliary: Cholecystectomy. Common hepatic duct 0.9 cm in diameter, common bile duct 0.8 cm in diameter. No definite filling defect or abnormal enhancement along the biliary tree. The lesion in segment 7 is  partially obscured by motion artifact. There is apparent transient hepatic attenuation difference peripheral to this lesion, which demonstrates arterial phase enhancement near the apex of the fat, but becomes increasingly isointense to the surrounding liver on later phases similar to the prior exam. No significant restricted diffusion in this vicinity. No significant appreciable abnormality on precontrast T1 or T2 weighted images. Pancreas: Stable dilated dorsal pancreatic duct at 0.6 cm, dilated ventral pancreatic duct at 0.4 cm. Spleen:  Unremarkable Adrenals/Urinary Tract:  Unremarkable Stomach/Bowel: Scattered colonic diverticula particularly in the transverse and descending colon. Vascular/Lymphatic:  Unremarkable Other:  No supplemental non-categorized findings. Musculoskeletal: Lumbar spondylosis and degenerative disc disease. IMPRESSION: 1. Transient hepatic attenuation difference in segment 7 of the liver appears unchanged. Along the medial margin of this process, there could conceivably be a small vascular malformation, atypical hemangioma, or focal nodular hyperplasia contributing, but no signal abnormalities visible in this region on T2 or precontrast T1 weighted images. The lack of progression is further reassuring. If the patient has a history of malignancy, consider a follow up MRI in 1 years time using Eovist contrast medium. 2. Stable mild extrahepatic and ventral pancreatic duct dilatation. 3. Lumbar spondylosis and degenerative disc disease. 4. Scattered colonic diverticula. Electronically Signed   By: Van Clines M.D.   On: 02/22/2020 10:11    Assessment &  Plan:   Problem List Items Addressed This Visit     Anxiety    Discussed. Pt declined meds, declined ref to psychology, psychiatry... Try Valerian root for anxiety      Atrophic vaginitis    on Estrocream PV      Hip osteoarthritis    On Ibupofen prn Tramadol prn  Potential benefits of a long term opioids use as well as potential risks (i.e. addiction risk, apnea etc) and complications (i.e. Somnolence, constipation and others) were explained to the patient and were aknowledged. Not taking Celebrex      Relevant Medications   ibuprofen (ADVIL) 200 MG tablet   traMADol (ULTRAM) 50 MG tablet   Urinary urgency    Cont on Estrocream and Myrbetriq Discussed anxiety issues. Pt declined meds, declined ref to psychology, psychiatry... Try Valerian root for anxiety         Meds ordered this encounter  Medications   mirabegron ER (MYRBETRIQ) 50 MG TB24 tablet    Sig: Take 1 tablet (50 mg total) by mouth daily.    Dispense:  30 tablet    Refill:  11      Follow-up: No follow-ups on file.  Walker Kehr, MD

## 2021-07-29 NOTE — Assessment & Plan Note (Addendum)
Discussed. Pt declined meds, declined ref to psychology, psychiatry... Try Valerian root for anxiety

## 2021-08-06 ENCOUNTER — Ambulatory Visit (INDEPENDENT_AMBULATORY_CARE_PROVIDER_SITE_OTHER): Payer: Medicare Other

## 2021-08-06 DIAGNOSIS — Z0000 Encounter for general adult medical examination without abnormal findings: Secondary | ICD-10-CM

## 2021-08-06 NOTE — Progress Notes (Addendum)
I connected with Morgan Huff today by telephone and verified that I am speaking with the correct person using two identifiers. Location patient: home Location provider: work Persons participating in the virtual visit: patient, provider.   I discussed the limitations, risks, security and privacy concerns of performing an evaluation and management service by telephone and the availability of in person appointments. I also discussed with the patient that there may be a patient responsible charge related to this service. The patient expressed understanding and verbally consented to this telephonic visit.    Interactive audio and video telecommunications were attempted between this provider and patient, however failed, due to patient having technical difficulties OR patient did not have access to video capability.  We continued and completed visit with audio only.  Some vital signs may be absent or patient reported.   Time Spent with patient on telephone encounter: 40 minutes  Subjective:   Morgan Huff is a 76 y.o. female who presents for Medicare Annual (Subsequent) preventive examination.  Review of Systems     Cardiac Risk Factors include: advanced age (>39men, >81 women);family history of premature cardiovascular disease;hypertension;dyslipidemia     Objective:    There were no vitals filed for this visit. There is no height or weight on file to calculate BMI.  Advanced Directives 08/06/2021 04/23/2020 09/22/2018 09/22/2018 09/19/2018 04/14/2018 04/13/2017  Does Patient Have a Medical Advance Directive? Yes Yes Yes Yes Yes Yes Yes  Type of Advance Directive - [place];Living will [place];Living will Living will;Healthcare Power of [place];Living will [place];Living will  Does patient want to make changes to medical advance directive? No - Patient declined - No -  Patient declined - No - Patient declined - -  Copy of [place] in Chart? - Yes - validated most recent copy scanned in chart (See row information) - No - copy requested No - copy requested - No - copy requested  Would patient like information on creating a medical advance directive? - - - - - - -    Current Medications (verified) Outpatient Encounter Medications as of 08/06/2021  Medication Sig   Ascorbic Acid (VITAMIN C) 500 MG CAPS Take 500 mg by mouth daily.   aspirin 81 MG EC tablet Take 81 mg by mouth daily. Swallow whole.   Biotin 5 MG TABS Take 5,000 mg by mouth daily. Actually 5000 mcg -1 capsule daily   brimonidine-timolol (COMBIGAN) 0.2-0.5 % ophthalmic solution Apply to eye.   calcium carbonate (TUMS - DOSED IN MG ELEMENTAL CALCIUM) 500 MG chewable tablet Chew 2 tablets by mouth daily.    cholecalciferol (VITAMIN D) 1000 units tablet Take 2 tablets (2,000 Units total) by mouth daily.   diclofenac Sodium (VOLTAREN) 1 % GEL Apply topically as needed.   estradiol (ESTRACE VAGINAL) 0.1 MG/GM vaginal cream Place 1 Applicatorful vaginally 3 (three) times a week.   ibuprofen (ADVIL) 200 MG tablet Take 200 mg by mouth every 6 (six) hours as needed.   latanoprost (XALATAN) 0.005 % ophthalmic solution Place 1 drop into the left eye at bedtime.   mirabegron ER (MYRBETRIQ) 50 MG TB24 tablet Take 1 tablet (50 mg total) by mouth daily.   Multiple Vitamins-Minerals (CENTRUM SILVER PO) Take 1 tablet by mouth daily.   naproxen (NAPROSYN) 500 MG tablet Take 1 tablet with food once or twice a day for pain/stiffness/arthritis   Polyethyl Glycol-Propyl Glycol 0.4-0.3 % SOLN  Place 1-2 drops into both eyes 3 (three) times daily as needed (for dry eyes).    simvastatin (ZOCOR) 10 MG tablet TAKE 1 TABLET DAILY   traMADol (ULTRAM) 50 MG tablet Take by mouth every 8 (eight) hours as needed.   No facility-administered encounter medications on file as of 08/06/2021.    Allergies  (verified) Latex   History: Past Medical History:  Diagnosis Date   Arthritis    Colon polyps    Diverticulosis of colon    Dysrhythmia    palpitations recently   Fasting hyperglycemia    Glaucoma    Dr Edilia Bo   Heart murmur    Hyperlipidemia    LDL goal = < 100; Lp(a) 112; hyperabsorber   Pre-diabetes    per patient   Past Surgical History:  Procedure Laterality Date   ABDOMINAL HYSTERECTOMY     ANTERIOR AND POSTERIOR REPAIR WITH SACROSPINOUS FIXATION N/A 06/05/2015   Procedure: ANTERIOR AND POSTERIOR REPAIR WITH SACROSPINOUS LIGAMENT SUSPENSION;  Surgeon: Louretta Shorten, MD;  Location: [facility];  Service: Gynecology;  Laterality: N/A;   CATARACT EXTRACTION, BILATERAL     lens implant   CHOLECYSTECTOMY     CHOLECYSTECTOMY, LAPAROSCOPIC     Dr Margot Chimes   COLONOSCOPY  2015   neg; due 2020   colonoscopy with polypectomy     Dr Watt Climes; X 3   EYE SURGERY     trabeculectomy ;Dr Kendall Flack.Cataract removal OD; Dr Scot Dock   TOTAL HIP ARTHROPLASTY Right 09/22/2018   Procedure: TOTAL HIP ARTHROPLASTY ANTERIOR APPROACH;  Surgeon: Dorna Leitz, MD;  Location: [facility];  Service: Orthopedics;  Laterality: Right;   Family History  Problem Relation Age of Onset   Polycythemia Father        transition into leukemia   Hypokalemia Father    Leukemia Father    Glaucoma Father    Heart attack Father 58       in context hypokalemia   Lung cancer Mother        smoker   Heart attack Maternal Grandfather 73   Breast cancer Maternal Grandmother    Melanoma Brother    Diabetes Neg Hx    Stroke Neg Hx    Social History   Socioeconomic History   Marital status: Single    Spouse name: Not on file   Number of children: Not on file   Years of education: Not on file   Highest education level: Not on file  Occupational History   Occupation: Mudlogger of shows, semi-retired  Tobacco Use   Smoking status: Never   Smokeless tobacco: Never  Vaping Use   Vaping Use: Never used  Substance and Sexual  Activity   Alcohol use: Yes    Comment:  rarely   Drug use: No   Sexual activity: Not Currently  Other Topics Concern   Not on file  Social History Narrative   Not on file   Social Determinants of Health   Financial Resource Strain: Low Risk    Difficulty of Paying Living Expenses: Not hard at all  Food Insecurity: No Food Insecurity   Worried About Charity fundraiser in the Last Year: Never true   [place] in the Last Year: Never true  Transportation Needs: No Transportation Needs   Lack of Transportation (Medical): No   Lack of Transportation (Non-Medical): No  Physical Activity: Sufficiently Active   Days of Exercise per Week: 5 days   Minutes of Exercise per Session: 30 min  Stress: No Stress Concern Present   Feeling of Stress : Not at all  Social Connections: Moderately Integrated   Frequency of Communication with Friends and Family: More than three times a week   Frequency of Social Gatherings with Friends and Family: More than three times a week   Attends Religious Services: 1 to 4 times per year   Active Member of [employer] or Organizations: Yes   Attends Archivist Meetings: 1 to 4 times per year   Marital Status: Never married    Tobacco Counseling Counseling given: Not Answered   Clinical Intake:  Pre-visit preparation completed: Yes  Pain : No/denies pain     Nutritional Risks: None Diabetes: No  How often do you need to have someone help you when you read instructions, pamphlets, or other written materials from your doctor or pharmacy?: 1 - Never What is the last grade level you completed in school?: Bachelor's Degree  Diabetic? no  Interpreter Needed?: No  Information entered by :: Lisette Abu, LPN   Activities of Daily Living In your present state of health, do you have any difficulty performing the following activities: 08/06/2021 07/29/2021  Hearing? N N  Vision? N N  Difficulty concentrating or making decisions? N N   Walking or climbing stairs? N N  Dressing or bathing? N N  Doing errands, shopping? N N  Preparing Food and eating ? N -  Using the Toilet? N -  In the past six months, have you accidently leaked urine? N -  Do you have problems with loss of bowel control? N -  Managing your Medications? N -  Managing your Finances? N -  Housekeeping or managing your Housekeeping? N -  Some recent data might be hidden    Patient Care Team: Plotnikov, Evie Lacks, MD as PCP - General (Internal Medicine) Clarene Essex, MD as Consulting Physician (Gastroenterology) Louretta Shorten, MD as Consulting Physician (Obstetrics and Gynecology) Stefanie Libel, MD as Consulting Physician (Sports Medicine) Dorna Leitz, MD as Consulting Physician (Orthopedic Surgery) Bjorn Loser, MD as Consulting Physician (Urology) Robley Fries, MD as Consulting Physician (Urology)  Indicate any recent Medical Services you may have received from other than [facility] providers in the past year (date may be approximate).     Assessment:   This is a routine wellness examination for Mabel.  Hearing/Vision screen Hearing Screening - Comments:: Patient denied any hearing difficulty.   No hearing aids.  Vision Screening - Comments:: Patient wears corrective glasses/contacts.  Eye exam done annually by: Dr. Raynelle Fanning  Dietary issues and exercise activities discussed: Current Exercise Habits: Home exercise routine, Type of exercise: walking;stretching, Time (Minutes): 30, Frequency (Times/Week): 5, Weekly Exercise (Minutes/Week): 150, Intensity: Moderate, Exercise limited by: orthopedic condition(s)   Goals Addressed   None   Depression Screen PHQ 2/9 Scores 08/06/2021 04/29/2021 04/23/2020 10/22/2019 04/14/2018 04/13/2017 09/07/2016  PHQ - 2 Score 0 0 1 0 0 0 0  PHQ- 9 Score - 0 - - - 0 -  Exception Documentation - - - - - - Other- indicate reason in comment box    Fall Risk Fall Risk  08/06/2021 04/29/2021 04/23/2020 10/22/2019  04/14/2018  Falls in the past year? 0 0 0 0 No  Number falls in past yr: 0 0 0 - -  Injury with Fall? 0 0 0 - -  Risk for fall due to : No Fall Risks No Fall Risks No Fall Risks - Impaired mobility  Follow up Falls evaluation completed -  Falls evaluation completed Falls evaluation completed -    FALL RISK PREVENTION PERTAINING TO THE HOME:  Any stairs in or around the home? Yes  If so, are there any without handrails? No  Home free of loose throw rugs in walkways, pet beds, electrical cords, etc? Yes  Adequate lighting in your home to reduce risk of falls? Yes   ASSISTIVE DEVICES UTILIZED TO PREVENT FALLS:  Life alert? Yes  Use of a cane, walker or w/c? No  Grab bars in the bathroom? Yes  Shower chair or bench in shower? Yes  Elevated toilet seat or a handicapped toilet? Yes   TIMED UP AND GO:  Was the test performed? No .  Length of time to ambulate 10 feet: n/a sec.   Gait steady and fast without use of assistive device  Cognitive Function: Normal cognitive status assessed by direct observation by this Nurse Health Advisor. No abnormalities found.   MMSE - Mini Mental State Exam 04/13/2017  Orientation to time 5  Orientation to Place 5  Registration 3  Attention/ Calculation 5  Recall 3  Language- name 2 objects 2  Language- repeat 1  Language- follow 3 step command 3  Language- read & follow direction 1  Write a sentence 1  Copy design 1  Total score 30     6CIT Screen 04/23/2020  What Year? 0 points  What month? 0 points  What time? 0 points  Count back from 20 0 points  Months in reverse 0 points  Repeat phrase 0 points  Total Score 0    Immunizations Immunization History  Administered Date(s) Administered   Fluad Quad(high Dose 65+) 03/26/2019, 04/23/2020, 04/29/2021   Influenza Whole 05/02/2012, 05/02/2013   Influenza, High Dose Seasonal PF 04/11/2014, 04/12/2016, 04/13/2017   Influenza-Unspecified 04/27/2015, 05/12/2018   Moderna SARS-COV2 Booster  Vaccination 06/17/2020, 12/18/2020   Moderna Sars-Covid-2 Vaccination 08/14/2019, 09/12/2019   Pneumococcal Conjugate-13 04/11/2015   Pneumococcal Polysaccharide-23 03/17/2011   Td 02/20/2008   Tdap 06/26/2019   Zoster Recombinat (Shingrix) 09/08/2017, 11/24/2017   Zoster, Live 08/02/2010    TDAP status: Up to date  Flu Vaccine status: Up to date  Pneumococcal vaccine status: Up to date  Covid-19 vaccine status: Completed vaccines  Qualifies for Shingles Vaccine? Yes   Zostavax completed Yes   Shingrix Completed?: Yes  Screening Tests Health Maintenance  Topic Date Due   COVID-19 Vaccine (3 - Booster for Moderna series) 02/12/2021   COLONOSCOPY (Pts 45-56yrs Insurance coverage will need to be confirmed)  10/16/2024   TETANUS/TDAP  06/25/2029   Pneumonia Vaccine 7+ Years old  Completed   INFLUENZA VACCINE  Completed   DEXA SCAN  Completed   Hepatitis C Screening  Completed   Zoster Vaccines- Shingrix  Completed   HPV VACCINES  Aged Out    Health Maintenance  Health Maintenance Due  Topic Date Due   COVID-19 Vaccine (3 - Booster for Moderna series) 02/12/2021    Colorectal cancer screening: Type of screening: Colonoscopy. Completed 02/21/2019. Repeat every 10 years  Mammogram status: Completed 03/30/2021. Repeat every year  Lung Cancer Screening: (Low Dose CT Chest recommended if Age 12-80 years, 30 pack-year currently smoking OR have quit w/in 15years.) does not qualify.   Lung Cancer Screening Referral: no  Additional Screening:  Hepatitis C Screening: does qualify; Completed yes  Vision Screening: Recommended annual ophthalmology exams for early detection of glaucoma and other disorders of the eye. Is the patient up to date with their annual eye exam?  Yes  Who is the provider or what is the name of the office in which the patient attends annual eye exams? Dr. Raynelle Fanning  If pt is not established with a provider, would they like to be referred to a provider  to establish care? No .   Dental Screening: Recommended annual dental exams for proper oral hygiene  Community Resource Referral / Chronic Care Management: CRR required this visit?  No   CCM required this visit?  No      Plan:     I have personally reviewed and noted the following in the patients chart:   Medical and social history Use of alcohol, tobacco or illicit drugs  Current medications and supplements including opioid prescriptions.  Functional ability and status Nutritional status Physical activity Advanced directives List of other physicians Hospitalizations, surgeries, and ER visits in previous 12 months Vitals Screenings to include cognitive, depression, and falls Referrals and appointments  In addition, I have reviewed and discussed with patient certain preventive protocols, quality metrics, and best practice recommendations. A written personalized care plan for preventive services as well as general preventive health recommendations were provided to patient.     Sheral Flow, LPN   03/08/6810   Nurse Notes:  Patient is cogitatively intact. There were no vitals filed for this visit. There is no height or weight on file to calculate BMI.  Medical screening examination/treatment/procedure(s) were performed by non-physician practitioner and as supervising physician I was immediately available for consultation/collaboration.  I agree with above. Lew Dawes, MD

## 2021-08-12 ENCOUNTER — Other Ambulatory Visit: Payer: Self-pay | Admitting: *Deleted

## 2021-08-12 MED ORDER — SIMVASTATIN 10 MG PO TABS: 10.0000 mg | ORAL_TABLET | Freq: Every day | ORAL | 3 refills | Status: DC

## 2021-08-28 ENCOUNTER — Encounter: Payer: Self-pay | Admitting: Internal Medicine

## 2021-08-28 ENCOUNTER — Telehealth: Payer: Self-pay | Admitting: Internal Medicine

## 2021-08-28 NOTE — Telephone Encounter (Signed)
Pt states she's had constant nasal drainage w/ a cough since 1-23  Pt requesting a call back to discuss otc medication recommendations to help w/ symptoms  Pt's next ov is 1-31

## 2021-08-28 NOTE — Telephone Encounter (Signed)
MD out of the office pls advise on msg below.../lmb 

## 2021-08-31 ENCOUNTER — Ambulatory Visit: Payer: Medicare Other | Admitting: Internal Medicine

## 2021-08-31 NOTE — Telephone Encounter (Signed)
Use Claritin 10 mg daily generic and Flonase nasal spray 2 sprays daily in each nostril.  Office visit if not better.  Thank you

## 2021-09-01 NOTE — Telephone Encounter (Signed)
Msg sent to pt vi mychart w/ MD response...lmb

## 2021-09-14 DIAGNOSIS — H401331 Pigmentary glaucoma, bilateral, mild stage: Secondary | ICD-10-CM | POA: Diagnosis not present

## 2021-09-22 DIAGNOSIS — Z20822 Contact with and (suspected) exposure to covid-19: Secondary | ICD-10-CM | POA: Diagnosis not present

## 2021-09-29 DIAGNOSIS — L438 Other lichen planus: Secondary | ICD-10-CM | POA: Diagnosis not present

## 2021-09-29 DIAGNOSIS — L821 Other seborrheic keratosis: Secondary | ICD-10-CM | POA: Diagnosis not present

## 2021-09-29 DIAGNOSIS — I8311 Varicose veins of right lower extremity with inflammation: Secondary | ICD-10-CM | POA: Diagnosis not present

## 2021-09-29 DIAGNOSIS — I872 Venous insufficiency (chronic) (peripheral): Secondary | ICD-10-CM | POA: Diagnosis not present

## 2021-09-29 DIAGNOSIS — D1801 Hemangioma of skin and subcutaneous tissue: Secondary | ICD-10-CM | POA: Diagnosis not present

## 2021-09-29 DIAGNOSIS — I8312 Varicose veins of left lower extremity with inflammation: Secondary | ICD-10-CM | POA: Diagnosis not present

## 2021-10-22 DIAGNOSIS — H401331 Pigmentary glaucoma, bilateral, mild stage: Secondary | ICD-10-CM | POA: Diagnosis not present

## 2021-10-24 DIAGNOSIS — Z20822 Contact with and (suspected) exposure to covid-19: Secondary | ICD-10-CM | POA: Diagnosis not present

## 2021-10-28 DIAGNOSIS — Z20822 Contact with and (suspected) exposure to covid-19: Secondary | ICD-10-CM | POA: Diagnosis not present

## 2021-10-29 DIAGNOSIS — Z20822 Contact with and (suspected) exposure to covid-19: Secondary | ICD-10-CM | POA: Diagnosis not present

## 2021-11-04 DIAGNOSIS — Z20822 Contact with and (suspected) exposure to covid-19: Secondary | ICD-10-CM | POA: Diagnosis not present

## 2021-11-07 DIAGNOSIS — Z20828 Contact with and (suspected) exposure to other viral communicable diseases: Secondary | ICD-10-CM | POA: Diagnosis not present

## 2021-11-09 DIAGNOSIS — R351 Nocturia: Secondary | ICD-10-CM | POA: Diagnosis not present

## 2021-11-09 DIAGNOSIS — R3915 Urgency of urination: Secondary | ICD-10-CM | POA: Diagnosis not present

## 2021-11-09 DIAGNOSIS — R350 Frequency of micturition: Secondary | ICD-10-CM | POA: Diagnosis not present

## 2021-11-24 ENCOUNTER — Telehealth: Payer: Self-pay | Admitting: Internal Medicine

## 2021-11-24 NOTE — Telephone Encounter (Signed)
Pt states she was informed by [facility] there has been a recall on simvastatin (ZOCOR) 10 MG tablet per fda inspection ? ?Pt inquiring if provider wants her to d/c medication ? ?Please advise  ?

## 2021-11-26 ENCOUNTER — Encounter: Payer: Self-pay | Admitting: Internal Medicine

## 2021-11-26 NOTE — Telephone Encounter (Signed)
Notified pt w/ MD response../l b 

## 2021-11-26 NOTE — Telephone Encounter (Signed)
Noted.  [facility] needs to request the pharmacy to replace it from another lot. ?Thanks ?

## 2021-11-27 DIAGNOSIS — Z20822 Contact with and (suspected) exposure to covid-19: Secondary | ICD-10-CM | POA: Diagnosis not present

## 2021-12-01 DIAGNOSIS — Z20822 Contact with and (suspected) exposure to covid-19: Secondary | ICD-10-CM | POA: Diagnosis not present

## 2021-12-07 DIAGNOSIS — Z20822 Contact with and (suspected) exposure to covid-19: Secondary | ICD-10-CM | POA: Diagnosis not present

## 2022-01-28 ENCOUNTER — Ambulatory Visit (INDEPENDENT_AMBULATORY_CARE_PROVIDER_SITE_OTHER): Payer: Medicare Other | Admitting: Internal Medicine

## 2022-01-28 ENCOUNTER — Encounter: Payer: Self-pay | Admitting: Internal Medicine

## 2022-01-28 VITALS — BP 136/78 | HR 67 | Temp 97.7°F | Ht 74.0 in | Wt 260.0 lb

## 2022-01-28 DIAGNOSIS — R739 Hyperglycemia, unspecified: Secondary | ICD-10-CM

## 2022-01-28 DIAGNOSIS — E785 Hyperlipidemia, unspecified: Secondary | ICD-10-CM | POA: Diagnosis not present

## 2022-01-28 DIAGNOSIS — R3915 Urgency of urination: Secondary | ICD-10-CM | POA: Diagnosis not present

## 2022-01-28 DIAGNOSIS — R413 Other amnesia: Secondary | ICD-10-CM | POA: Diagnosis not present

## 2022-01-28 LAB — COMPREHENSIVE METABOLIC PANEL
ALT: 14 U/L (ref 0–35)
AST: 18 U/L (ref 0–37)
Albumin: 4.5 g/dL (ref 3.5–5.2)
Alkaline Phosphatase: 69 U/L (ref 39–117)
BUN: 21 mg/dL (ref 6–23)
CO2: 31 mEq/L (ref 19–32)
Calcium: 10.2 mg/dL (ref 8.4–10.5)
Chloride: 103 mEq/L (ref 96–112)
Creatinine, Ser: 0.82 mg/dL (ref 0.40–1.20)
GFR: 69.56 mL/min (ref >60.00)
Glucose, Bld: 99 mg/dL (ref 70–99)
Potassium: 4.1 mEq/L (ref 3.5–5.1)
Sodium: 140 mEq/L (ref 135–145)
Total Bilirubin: 0.9 mg/dL (ref 0.2–1.2)
Total Protein: 7.5 g/dL (ref 6.0–8.3)

## 2022-01-28 LAB — HEMOGLOBIN A1C: Hgb A1c MFr Bld: 6.1 % (ref 4.6–6.5)

## 2022-01-28 LAB — CBC WITH DIFFERENTIAL/PLATELET
Basophils Absolute: 0 10*3/uL (ref 0.0–0.1)
Basophils Relative: 0.6 % (ref 0.0–3.0)
Eosinophils Absolute: 0.2 10*3/uL (ref 0.0–0.7)
Eosinophils Relative: 2.3 % (ref 0.0–5.0)
HCT: 45.3 % (ref 36.0–46.0)
Hemoglobin: 15 g/dL (ref 12.0–15.0)
Lymphocytes Relative: 27.2 % (ref 12.0–46.0)
Lymphs Abs: 2.1 10*3/uL (ref 0.7–4.0)
MCHC: 33 g/dL (ref 30.0–36.0)
MCV: 91.1 fl (ref 78.0–100.0)
Monocytes Absolute: 0.8 10*3/uL (ref 0.1–1.0)
Monocytes Relative: 10.4 % (ref 3.0–12.0)
Neutro Abs: 4.6 10*3/uL (ref 1.4–7.7)
Neutrophils Relative %: 59.5 % (ref 43.0–77.0)
Platelets: 265 10*3/uL (ref 150.0–400.0)
RBC: 4.97 Mil/uL (ref 3.87–5.11)
RDW: 13.9 % (ref 11.5–15.5)
WBC: 7.7 10*3/uL (ref 4.0–10.5)

## 2022-01-28 LAB — LIPID PANEL
Cholesterol: 169 mg/dL (ref 0–200)
HDL: 54.4 mg/dL (ref >39.00)
LDL Cholesterol: 89 mg/dL (ref 0–99)
NonHDL: 114.25
Total CHOL/HDL Ratio: 3
Triglycerides: 127 mg/dL (ref 0.0–149.0)
VLDL: 25.4 mg/dL (ref 0.0–40.0)

## 2022-01-28 LAB — TSH: TSH: 2.6 u[IU]/mL (ref 0.35–5.50)

## 2022-01-28 MED ORDER — MIRABEGRON ER 50 MG PO TB24: 50.0000 mg | ORAL_TABLET | Freq: Every day | ORAL | 11 refills | Status: DC

## 2022-01-28 MED ORDER — IPRATROPIUM BROMIDE 0.06 % NA SOLN: 2.0000 | Freq: Three times a day (TID) | NASAL | 3 refills | Status: DC

## 2022-01-28 NOTE — Progress Notes (Signed)
Subjective:  Patient ID: Morgan Huff, female    DOB: 26-May-1946  Age: 76 y.o. MRN: [id number]  CC: No chief complaint on file.   HPI Morgan Huff presents for L hip pain - needs THR, R foot pain. C/o OAB C/o memory loss  Outpatient Medications Prior to Visit  Medication Sig Dispense Refill   Ascorbic Acid (VITAMIN C) 500 MG CAPS Take 500 mg by mouth daily.     aspirin 81 MG EC tablet Take 81 mg by mouth daily. Swallow whole.     Biotin 5 MG TABS Take 5,000 mg by mouth daily. Actually 5000 mcg -1 capsule daily     brimonidine-timolol (COMBIGAN) 0.2-0.5 % ophthalmic solution Apply to eye.     calcium carbonate (TUMS - DOSED IN MG ELEMENTAL CALCIUM) 500 MG chewable tablet Chew 2 tablets by mouth daily.      cholecalciferol (VITAMIN D) 1000 units tablet Take 2 tablets (2,000 Units total) by mouth daily. 100 tablet 3   diclofenac Sodium (VOLTAREN) 1 % GEL Apply topically as needed.     ibuprofen (ADVIL) 200 MG tablet Take 200 mg by mouth every 6 (six) hours as needed.     latanoprost (XALATAN) 0.005 % ophthalmic solution Place 1 drop into the left eye at bedtime.     Multiple Vitamins-Minerals (CENTRUM SILVER PO) Take 1 tablet by mouth daily.     Polyethyl Glycol-Propyl Glycol 0.4-0.3 % SOLN Place 1-2 drops into both eyes 3 (three) times daily as needed (for dry eyes).      simvastatin (ZOCOR) 10 MG tablet Take 1 tablet (10 mg total) by mouth daily. 90 tablet 3   traMADol (ULTRAM) 50 MG tablet Take by mouth every 8 (eight) hours as needed.     mirabegron ER (MYRBETRIQ) 50 MG TB24 tablet Take 1 tablet (50 mg total) by mouth daily. 30 tablet 11   estradiol (ESTRACE VAGINAL) 0.1 MG/GM vaginal cream Place 1 Applicatorful vaginally 3 (three) times a week. 42.5 g 3   naproxen (NAPROSYN) 500 MG tablet Take 1 tablet with food once or twice a day for pain/stiffness/arthritis 60 tablet 3   No facility-administered medications prior to visit.    ROS: Review of Systems  Constitutional:   Negative for activity change, appetite change, chills, fatigue and unexpected weight change.  HENT:  Negative for congestion, mouth sores and sinus pressure.   Eyes:  Negative for visual disturbance.  Respiratory:  Negative for cough and chest tightness.   Gastrointestinal:  Negative for abdominal pain and nausea.  Genitourinary:  Negative for difficulty urinating, frequency and vaginal pain.  Musculoskeletal:  Positive for arthralgias. Negative for back pain and gait problem.  Skin:  Negative for pallor and rash.  Neurological:  Negative for dizziness, tremors, weakness, numbness and headaches.  Psychiatric/Behavioral:  Positive for decreased concentration. Negative for confusion, sleep disturbance and suicidal ideas. The patient is nervous/anxious.     Objective:  BP 136/78 (BP Location: Left Arm, Patient Position: Sitting, Cuff Size: Normal)   Pulse 67   Temp 97.7 F (36.5 C) (Oral)   Ht '6\' 2"'$  (1.88 m)   Wt 260 lb (117.9 kg)   SpO2 92%   BMI 33.38 kg/m   BP Readings from Last 3 Encounters:  01/28/22 136/78  07/29/21 138/84  04/29/21 (!) 142/90    Wt Readings from Last 3 Encounters:  01/28/22 260 lb (117.9 kg)  07/29/21 263 lb (119.3 kg)  04/29/21 269 lb 12.8 oz (122.4 kg)  Physical Exam Constitutional:      General: She is not in acute distress.    Appearance: She is well-developed.  HENT:     Head: Normocephalic.     Right Ear: External ear normal.     Left Ear: External ear normal.     Nose: Nose normal.  Eyes:     General:        Right eye: No discharge.        Left eye: No discharge.     Conjunctiva/sclera: Conjunctivae normal.     Pupils: Pupils are equal, round, and reactive to light.  Neck:     Thyroid: No thyromegaly.     Vascular: No JVD.     Trachea: No tracheal deviation.  Cardiovascular:     Rate and Rhythm: Normal rate and regular rhythm.     Heart sounds: Normal heart sounds.  Pulmonary:     Effort: No respiratory distress.     Breath  sounds: No stridor. No wheezing.  Abdominal:     General: Bowel sounds are normal. There is no distension.     Palpations: Abdomen is soft. There is no mass.     Tenderness: There is no abdominal tenderness. There is no guarding or rebound.  Musculoskeletal:        General: Tenderness present.     Cervical back: Normal range of motion and neck supple. No rigidity.  Lymphadenopathy:     Cervical: No cervical adenopathy.  Skin:    Findings: No erythema or rash.  Neurological:     Mental Status: She is oriented to person, place, and time.     Cranial Nerves: No cranial nerve deficit.     Motor: No abnormal muscle tone.     Coordination: Coordination normal.     Gait: Gait abnormal.     Deep Tendon Reflexes: Reflexes normal.  Psychiatric:        Behavior: Behavior normal.        Thought Content: Thought content normal.        Judgment: Judgment normal.     Lab Results  Component Value Date   WBC 8.9 10/22/2020   HGB 14.1 10/22/2020   HCT 42.9 10/22/2020   PLT 282.0 10/22/2020   GLUCOSE 88 10/22/2020   CHOL 157 10/22/2020   TRIG 121.0 10/22/2020   HDL 44.90 10/22/2020   LDLDIRECT 85.6 02/20/2008   LDLCALC 88 10/22/2020   ALT 14 10/22/2020   AST 18 10/22/2020   NA 141 10/22/2020   K 4.2 10/22/2020   CL 105 10/22/2020   CREATININE 0.82 10/22/2020   BUN 23 10/22/2020   CO2 29 10/22/2020   TSH 2.37 10/22/2020   INR 1.00 09/19/2018   HGBA1C 6.4 10/22/2020   MICROALBUR 0.7 04/12/2016    MR ABDOMEN WWO CONTRAST  Result Date: 02/22/2020 CLINICAL DATA:  Surveillance imaging of indeterminate segment 7 lesion in the liver EXAM: MRI ABDOMEN WITHOUT AND WITH CONTRAST TECHNIQUE: Multiplanar multisequence MR imaging of the abdomen was performed both before and after the administration of intravenous contrast. CONTRAST:  4m MULTIHANCE GADOBENATE DIMEGLUMINE 529 MG/ML IV SOLN COMPARISON:  07/18/2019 FINDINGS: Body habitus reduces diagnostic sensitivity and specificity. Lower chest:  Unremarkable Hepatobiliary: Cholecystectomy. Common hepatic duct 0.9 cm in diameter, common bile duct 0.8 cm in diameter. No definite filling defect or abnormal enhancement along the biliary tree. The lesion in segment 7 is partially obscured by motion artifact. There is apparent transient hepatic attenuation difference peripheral to this lesion, which demonstrates  arterial phase enhancement near the apex of the fat, but becomes increasingly isointense to the surrounding liver on later phases similar to the prior exam. No significant restricted diffusion in this vicinity. No significant appreciable abnormality on precontrast T1 or T2 weighted images. Pancreas: Stable dilated dorsal pancreatic duct at 0.6 cm, dilated ventral pancreatic duct at 0.4 cm. Spleen:  Unremarkable Adrenals/Urinary Tract:  Unremarkable Stomach/Bowel: Scattered colonic diverticula particularly in the transverse and descending colon. Vascular/Lymphatic:  Unremarkable Other:  No supplemental non-categorized findings. Musculoskeletal: Lumbar spondylosis and degenerative disc disease. IMPRESSION: 1. Transient hepatic attenuation difference in segment 7 of the liver appears unchanged. Along the medial margin of this process, there could conceivably be a small vascular malformation, atypical hemangioma, or focal nodular hyperplasia contributing, but no signal abnormalities visible in this region on T2 or precontrast T1 weighted images. The lack of progression is further reassuring. If the patient has a history of malignancy, consider a follow up MRI in 1 years time using Eovist contrast medium. 2. Stable mild extrahepatic and ventral pancreatic duct dilatation. 3. Lumbar spondylosis and degenerative disc disease. 4. Scattered colonic diverticula. Electronically Signed   By: Van Clines M.D.   On: 02/22/2020 10:11    Assessment & Plan:   Problem List Items Addressed This Visit     Dyslipidemia   Relevant Orders   CBC with  Differential/Platelet   Lipid panel   Comprehensive metabolic panel   Hemoglobin A1c   Hyperglycemia - Primary   Relevant Orders   TSH   CBC with Differential/Platelet   Lipid panel   Comprehensive metabolic panel   Hemoglobin A1c   Memory problem    Try [place] mushroom for memory      Relevant Orders   TSH   CBC with Differential/Platelet   Lipid panel   Comprehensive metabolic panel   Urinary urgency    Drink more water On Myrbetriq      Relevant Orders   Lipid panel      Meds ordered this encounter  Medications   mirabegron ER (MYRBETRIQ) 50 MG TB24 tablet    Sig: Take 1 tablet (50 mg total) by mouth daily.    Dispense:  30 tablet    Refill:  11   ipratropium (ATROVENT) 0.06 % nasal spray    Sig: Place 2 sprays into the nose 3 (three) times daily.    Dispense:  15 mL    Refill:  3      Follow-up: Return in about 3 months (around 04/30/2022) for a follow-up visit.  Walker Kehr, MD

## 2022-01-28 NOTE — Patient Instructions (Addendum)
Drink water for overactive bladder flare-ups  Try [place] mushroom for [employer]

## 2022-01-28 NOTE — Assessment & Plan Note (Signed)
Try [employer] for [employer]

## 2022-01-28 NOTE — Assessment & Plan Note (Signed)
Drink more water On Myrbetriq

## 2022-02-01 DIAGNOSIS — H401331 Pigmentary glaucoma, bilateral, mild stage: Secondary | ICD-10-CM | POA: Diagnosis not present

## 2022-04-06 DIAGNOSIS — Z1231 Encounter for screening mammogram for malignant neoplasm of breast: Secondary | ICD-10-CM | POA: Diagnosis not present

## 2022-04-06 LAB — HM MAMMOGRAPHY

## 2022-04-07 ENCOUNTER — Encounter: Payer: Self-pay | Admitting: Internal Medicine

## 2022-04-15 DIAGNOSIS — R928 Other abnormal and inconclusive findings on diagnostic imaging of breast: Secondary | ICD-10-CM | POA: Diagnosis not present

## 2022-04-15 DIAGNOSIS — R590 Localized enlarged lymph nodes: Secondary | ICD-10-CM | POA: Diagnosis not present

## 2022-04-15 LAB — HM MAMMOGRAPHY

## 2022-04-19 ENCOUNTER — Encounter: Payer: Self-pay | Admitting: Internal Medicine

## 2022-04-23 ENCOUNTER — Telehealth: Payer: Self-pay | Admitting: Internal Medicine

## 2022-04-23 DIAGNOSIS — M159 Polyosteoarthritis, unspecified: Secondary | ICD-10-CM

## 2022-04-23 NOTE — Telephone Encounter (Signed)
Patient is out of town with her daughters and needs a portable bariatric toilet seat order placed with [facility] in [place], [place]. Daughter requests call back as soon as possible to let her know if we cannot take care of this today. Call back number is patient's daughter, [phone]

## 2022-04-25 NOTE — Telephone Encounter (Signed)
Okay.  Please send DME prescription. Diagnosis - osteoarthritis Thanks

## 2022-04-26 NOTE — Telephone Encounter (Signed)
Generated DME MD sign.Called pt no answer LMOM will need fax # on [place].Marland KitchenJohny Chess

## 2022-04-28 NOTE — Telephone Encounter (Signed)
Called pt spoke w/sister Mickel Baas) she states they went ahead and bought a portable toilet for mom. They are out of town and not to worry abt DME being fax.Marland KitchenJohny Chess

## 2022-05-03 ENCOUNTER — Ambulatory Visit (INDEPENDENT_AMBULATORY_CARE_PROVIDER_SITE_OTHER): Payer: Medicare Other | Admitting: Internal Medicine

## 2022-05-03 ENCOUNTER — Encounter: Payer: Self-pay | Admitting: Internal Medicine

## 2022-05-03 ENCOUNTER — Other Ambulatory Visit (HOSPITAL_BASED_OUTPATIENT_CLINIC_OR_DEPARTMENT_OTHER): Payer: Self-pay

## 2022-05-03 VITALS — BP 140/78 | HR 66 | Temp 98.9°F | Ht 74.0 in | Wt 253.6 lb

## 2022-05-03 DIAGNOSIS — R3915 Urgency of urination: Secondary | ICD-10-CM | POA: Diagnosis not present

## 2022-05-03 DIAGNOSIS — R413 Other amnesia: Secondary | ICD-10-CM

## 2022-05-03 DIAGNOSIS — E783 Hyperchylomicronemia: Secondary | ICD-10-CM | POA: Diagnosis not present

## 2022-05-03 DIAGNOSIS — F439 Reaction to severe stress, unspecified: Secondary | ICD-10-CM | POA: Diagnosis not present

## 2022-05-03 MED ORDER — FLUAD QUADRIVALENT 0.5 ML IM PRSY
PREFILLED_SYRINGE | INTRAMUSCULAR | 0 refills | Status: DC
  Filled 2022-05-03 – 2022-06-28 (×2): qty 0.5, 1d supply, fill #0

## 2022-05-03 MED ORDER — MEMANTINE HCL 5 MG PO TABS: 5.0000 mg | ORAL_TABLET | Freq: Two times a day (BID) | ORAL | 5 refills | Status: DC

## 2022-05-03 NOTE — Patient Instructions (Addendum)
Hold Simvastatin x 4 weeks due to memory issues  Check on Famotidine 20 mg and Cetrizine 10 mg  for overactive bladder

## 2022-05-03 NOTE — Assessment & Plan Note (Addendum)
Worse per pt. Discussed. MCD vs other.  Hold Simvastatin x 4 weeks due to memory issues Will try [facility] ref

## 2022-05-03 NOTE — Assessment & Plan Note (Signed)
Morgan Huff appears stressed by multiple issues.  Discussed.

## 2022-05-03 NOTE — Progress Notes (Signed)
Subjective:  Patient ID: Morgan Huff, female    DOB: [birth year]  Age: 76 y.o. MRN: [id number]  CC: Follow-up (3 MONTH F/U)   HPI Morgan Huff presents for more memory loss per family, OAB, stress. Morgan Huff is forgetful, getting lost while driving (she has been driving very infrequently).   Outpatient Medications Prior to Visit  Medication Sig Dispense Refill   Ascorbic Acid (VITAMIN C) 500 MG CAPS Take 500 mg by mouth daily.     aspirin 81 MG EC tablet Take 81 mg by mouth daily. Swallow whole.     Biotin 5 MG TABS Take 5,000 mg by mouth daily. Actually 5000 mcg -1 capsule daily     brimonidine-timolol (COMBIGAN) 0.2-0.5 % ophthalmic solution Apply to eye.     calcium carbonate (TUMS - DOSED IN MG ELEMENTAL CALCIUM) 500 MG chewable tablet Chew 2 tablets by mouth daily.      cholecalciferol (VITAMIN D) 1000 units tablet Take 2 tablets (2,000 Units total) by mouth daily. 100 tablet 3   diclofenac Sodium (VOLTAREN) 1 % GEL Apply topically as needed.     ibuprofen (ADVIL) 200 MG tablet Take 200 mg by mouth every 6 (six) hours as needed.     ipratropium (ATROVENT) 0.06 % nasal spray Place 2 sprays into the nose 3 (three) times daily. 15 mL 3   latanoprost (XALATAN) 0.005 % ophthalmic solution Place 1 drop into the left eye at bedtime.     mirabegron ER (MYRBETRIQ) 50 MG TB24 tablet Take 1 tablet (50 mg total) by mouth daily. 30 tablet 11   Multiple Vitamins-Minerals (CENTRUM SILVER PO) Take 1 tablet by mouth daily.     Polyethyl Glycol-Propyl Glycol 0.4-0.3 % SOLN Place 1-2 drops into both eyes 3 (three) times daily as needed (for dry eyes).      simvastatin (ZOCOR) 10 MG tablet Take 1 tablet (10 mg total) by mouth daily. 90 tablet 3   traMADol (ULTRAM) 50 MG tablet Take by mouth every 8 (eight) hours as needed.     No facility-administered medications prior to visit.    ROS: Review of Systems  Constitutional:  Negative for activity change, appetite change, chills, fatigue and  unexpected weight change.  HENT:  Negative for congestion, mouth sores and sinus pressure.   Eyes:  Negative for visual disturbance.  Respiratory:  Negative for cough and chest tightness.   Gastrointestinal:  Negative for abdominal pain and nausea.  Genitourinary:  Negative for difficulty urinating, frequency and vaginal pain.  Musculoskeletal:  Negative for back pain and gait problem.  Skin:  Negative for pallor and rash.  Neurological:  Negative for dizziness, tremors, weakness, numbness and headaches.  Psychiatric/Behavioral:  Positive for decreased concentration. Negative for behavioral problems, confusion, sleep disturbance and suicidal ideas. The patient is nervous/anxious.     Objective:  BP (!) 140/78 (BP Location: Left Arm)   Pulse 66   Temp 98.9 F (37.2 C) (Oral)   Ht '6\' 2"'$  (1.88 m)   Wt 253 lb 9.6 oz (115 kg)   SpO2 97%   BMI 32.56 kg/m   BP Readings from Last 3 Encounters:  05/03/22 (!) 140/78  01/28/22 136/78  07/29/21 138/84    Wt Readings from Last 3 Encounters:  05/03/22 253 lb 9.6 oz (115 kg)  01/28/22 260 lb (117.9 kg)  07/29/21 263 lb (119.3 kg)    Physical Exam Constitutional:      General: She is not in acute distress.    Appearance: She  is well-developed.  HENT:     Head: Normocephalic.     Right Ear: External ear normal.     Left Ear: External ear normal.     Nose: Nose normal.  Eyes:     General:        Right eye: No discharge.        Left eye: No discharge.     Conjunctiva/sclera: Conjunctivae normal.     Pupils: Pupils are equal, round, and reactive to light.  Neck:     Thyroid: No thyromegaly.     Vascular: No JVD.     Trachea: No tracheal deviation.  Cardiovascular:     Rate and Rhythm: Normal rate and regular rhythm.     Heart sounds: Normal heart sounds.  Pulmonary:     Effort: No respiratory distress.     Breath sounds: No stridor. No wheezing.  Abdominal:     General: Bowel sounds are normal. There is no distension.      Palpations: Abdomen is soft. There is no mass.     Tenderness: There is no abdominal tenderness. There is no guarding or rebound.  Musculoskeletal:        General: No tenderness.     Cervical back: Normal range of motion and neck supple. No rigidity.  Lymphadenopathy:     Cervical: No cervical adenopathy.  Skin:    Findings: No erythema or rash.  Neurological:     Mental Status: She is oriented to person, place, and time.     Cranial Nerves: No cranial nerve deficit.     Motor: No abnormal muscle tone.     Coordination: Coordination normal.     Deep Tendon Reflexes: Reflexes normal.  Psychiatric:        Behavior: Behavior normal.        Thought Content: Thought content normal.        Judgment: Judgment normal.   Morgan Huff appears stressed  Lab Results  Component Value Date   WBC 7.7 01/28/2022   HGB 15.0 01/28/2022   HCT 45.3 01/28/2022   PLT 265.0 01/28/2022   GLUCOSE 99 01/28/2022   CHOL 169 01/28/2022   TRIG 127.0 01/28/2022   HDL 54.40 01/28/2022   LDLDIRECT 85.6 02/20/2008   LDLCALC 89 01/28/2022   ALT 14 01/28/2022   AST 18 01/28/2022   NA 140 01/28/2022   K 4.1 01/28/2022   CL 103 01/28/2022   CREATININE 0.82 01/28/2022   BUN 21 01/28/2022   CO2 31 01/28/2022   TSH 2.60 01/28/2022   INR 1.00 09/19/2018   HGBA1C 6.1 01/28/2022   MICROALBUR 0.7 04/12/2016    MR ABDOMEN WWO CONTRAST  Result Date: 02/22/2020 CLINICAL DATA:  Surveillance imaging of indeterminate segment 7 lesion in the liver EXAM: MRI ABDOMEN WITHOUT AND WITH CONTRAST TECHNIQUE: Multiplanar multisequence MR imaging of the abdomen was performed both before and after the administration of intravenous contrast. CONTRAST:  68m MULTIHANCE GADOBENATE DIMEGLUMINE 529 MG/ML IV SOLN COMPARISON:  07/18/2019 FINDINGS: Body habitus reduces diagnostic sensitivity and specificity. Lower chest: Unremarkable Hepatobiliary: Cholecystectomy. Common hepatic duct 0.9 cm in diameter, common bile duct 0.8 cm in diameter.  No definite filling defect or abnormal enhancement along the biliary tree. The lesion in segment 7 is partially obscured by motion artifact. There is apparent transient hepatic attenuation difference peripheral to this lesion, which demonstrates arterial phase enhancement near the apex of the fat, but becomes increasingly isointense to the surrounding liver on later phases similar to the prior exam.  No significant restricted diffusion in this vicinity. No significant appreciable abnormality on precontrast T1 or T2 weighted images. Pancreas: Stable dilated dorsal pancreatic duct at 0.6 cm, dilated ventral pancreatic duct at 0.4 cm. Spleen:  Unremarkable Adrenals/Urinary Tract:  Unremarkable Stomach/Bowel: Scattered colonic diverticula particularly in the transverse and descending colon. Vascular/Lymphatic:  Unremarkable Other:  No supplemental non-categorized findings. Musculoskeletal: Lumbar spondylosis and degenerative disc disease. IMPRESSION: 1. Transient hepatic attenuation difference in segment 7 of the liver appears unchanged. Along the medial margin of this process, there could conceivably be a small vascular malformation, atypical hemangioma, or focal nodular hyperplasia contributing, but no signal abnormalities visible in this region on T2 or precontrast T1 weighted images. The lack of progression is further reassuring. If the patient has a history of malignancy, consider a follow up MRI in 1 years time using Eovist contrast medium. 2. Stable mild extrahepatic and ventral pancreatic duct dilatation. 3. Lumbar spondylosis and degenerative disc disease. 4. Scattered colonic diverticula. Electronically Signed   By: Van Clines M.D.   On: 02/22/2020 10:11    Assessment & Plan:   Problem List Items Addressed This Visit     Hyperlipidemia    On diet Hold Simvastatin x 4 weeks due to memory issues      Memory problem - Primary    Worse per pt. Discussed. MCD vs other.  Hold Simvastatin x 4  weeks due to memory issues Will try [place] [facility] ref       Relevant Orders   Ambulatory referral to Neurology   Stress at home    Kennita appears stressed by multiple issues.  Discussed.      Urinary urgency    Check on Famotidine 20 mg and Cetrizine 10 mg  for overactive bladder         Meds ordered this encounter  Medications   memantine (NAMENDA) 5 MG tablet    Sig: Take 1 tablet (5 mg total) by mouth 2 (two) times daily.    Dispense:  60 tablet    Refill:  5      Follow-up: Return in about 3 months (around 08/03/2022) for a follow-up visit.  Walker Kehr, MD

## 2022-05-03 NOTE — Assessment & Plan Note (Signed)
Check on Famotidine 20 mg and Cetrizine 10 mg  for overactive bladder

## 2022-05-03 NOTE — Assessment & Plan Note (Addendum)
On diet Hold Simvastatin x 4 weeks due to memory issues

## 2022-05-04 ENCOUNTER — Encounter: Payer: Self-pay | Admitting: Physician Assistant

## 2022-05-05 ENCOUNTER — Other Ambulatory Visit (HOSPITAL_BASED_OUTPATIENT_CLINIC_OR_DEPARTMENT_OTHER): Payer: Self-pay

## 2022-05-05 ENCOUNTER — Ambulatory Visit: Payer: Medicare Other | Admitting: Internal Medicine

## 2022-05-12 ENCOUNTER — Encounter: Payer: Self-pay | Admitting: Physician Assistant

## 2022-05-12 ENCOUNTER — Ambulatory Visit (INDEPENDENT_AMBULATORY_CARE_PROVIDER_SITE_OTHER): Payer: Medicare Other | Admitting: Physician Assistant

## 2022-05-12 ENCOUNTER — Other Ambulatory Visit (INDEPENDENT_AMBULATORY_CARE_PROVIDER_SITE_OTHER): Payer: Medicare Other

## 2022-05-12 VITALS — BP 181/82 | HR 79 | Resp 18 | Ht 74.0 in | Wt 256.0 lb

## 2022-05-12 DIAGNOSIS — R413 Other amnesia: Secondary | ICD-10-CM

## 2022-05-12 NOTE — Patient Instructions (Addendum)
It was a pleasure to see you today at our office.   Recommendations:  MRI brain without contrast to assess for underlying structural abnormality and assess vascular load  Neurocognitive testing to further evaluate cognitive concerns and determine other underlying cause of memory changes, including potential contribution from sleep, anxiety, or depression  Check B12  Monitor driving Agree with holding Simvastatin for a few weeks to monitor for cognitive improvement Patient is currently on Memantine  5 mg at night for 2 week then increase 5 mg twice a day as per PCP (05/03/22)  Recommend psychotherapy /CBT for stress management Folllow up 1 month   Whom to call:  Memory  decline, memory medications: Call our office [phone]   For psychiatric meds, mood meds: Please have your primary care physician manage these medications.   Counseling regarding caregiver distress, including caregiver depression, anxiety and issues regarding community resources, adult day care programs, adult living facilities, or memory care questions:   Feel free to contact Truman, Social Worker at [phone]   For assessment of decision of mental capacity and competency:  Call Dr. Anthoney Harada, geriatric psychiatrist at [phone]  For guidance in geriatric dementia issues please call [facility] [phone]  For guidance regarding [facility] and if placement were needed at the facility, contact Arnell Asal, Social Worker tel: [phone]  If you have any severe symptoms of a stroke, or other severe issues such as confusion,severe chills or fever, etc call 911 or go to the ER as you may need to be evaluated further     RECOMMENDATIONS FOR ALL PATIENTS WITH MEMORY PROBLEMS: 1. Continue to exercise (Recommend 30 minutes of walking everyday, or 3 hours every week) 2. Increase social interactions - continue going to [place] and enjoy social gatherings with  friends and family 3. Eat healthy, avoid fried foods and eat more fruits and vegetables 4. Maintain adequate blood pressure, blood sugar, and blood cholesterol level. Reducing the risk of stroke and cardiovascular disease also helps promoting better memory. 5. Avoid stressful situations. Live a simple life and avoid aggravations. Organize your time and prepare for the next day in anticipation. 6. Sleep well, avoid any interruptions of sleep and avoid any distractions in the bedroom that may interfere with adequate sleep quality 7. Avoid sugar, avoid sweets as there is a strong link between excessive sugar intake, diabetes, and cognitive impairment We discussed the Mediterranean diet, which has been shown to help patients reduce the risk of progressive memory disorders and reduces cardiovascular risk. This includes eating fish, eat fruits and green leafy vegetables, nuts like almonds and hazelnuts, walnuts, and also use olive oil. Avoid fast foods and fried foods as much as possible. Avoid sweets and sugar as sugar use has been linked to worsening of memory function.  There is always a concern of gradual progression of memory problems. If this is the case, then we may need to adjust level of care according to patient needs. Support, both to the patient and caregiver, should then be put into place.      You have been referred for a neuropsychological evaluation (i.e., evaluation of memory and thinking abilities). Please bring someone with you to this appointment if possible, as it is helpful for the doctor to hear from both you and another adult who knows you well. Please bring eyeglasses and hearing aids if you wear them.    The evaluation will take approximately 3 hours and has two parts:  The first part is a clinical interview with the neuropsychologist (Dr. Melvyn Novas or Dr. Nicole Kindred). During the interview, the neuropsychologist will speak with you and the individual you brought to the appointment.     The second part of the evaluation is testing with the doctor's technician Hinton Dyer or Maudie Mercury). During the testing, the technician will ask you to remember different types of material, solve problems, and answer some questionnaires. Your family member will not be present for this portion of the evaluation.   Please note: We must reserve several hours of the neuropsychologist's time and the psychometrician's time for your evaluation appointment. As such, there is a No-Show fee of $100. If you are unable to attend any of your appointments, please contact our office as soon as possible to reschedule.    FALL PRECAUTIONS: Be cautious when walking. Scan the area for obstacles that may increase the risk of trips and falls. When getting up in the mornings, sit up at the edge of the bed for a few minutes before getting out of bed. Consider elevating the bed at the head end to avoid drop of blood pressure when getting up. Walk always in a well-lit room (use night lights in the walls). Avoid area rugs or power cords from appliances in the middle of the walkways. Use a walker or a cane if necessary and consider physical therapy for balance exercise. Get your eyesight checked regularly.  FINANCIAL OVERSIGHT: Supervision, especially oversight when making financial decisions or transactions is also recommended.  HOME SAFETY: Consider the safety of the kitchen when operating appliances like stoves, microwave oven, and blender. Consider having supervision and share cooking responsibilities until no longer able to participate in those. Accidents with firearms and other hazards in the house should be identified and addressed as well.   ABILITY TO BE LEFT ALONE: If patient is unable to contact 911 operator, consider using LifeLine, or when the need is there, arrange for someone to stay with patients. Smoking is a fire hazard, consider supervision or cessation. Risk of wandering should be assessed by caregiver and if detected at  any point, supervision and safe proof recommendations should be instituted.  MEDICATION SUPERVISION: Inability to self-administer medication needs to be constantly addressed. Implement a mechanism to ensure safe administration of the medications.   DRIVING: Regarding driving, in patients with progressive memory problems, driving will be impaired. We advise to have someone else do the driving if trouble finding directions or if minor accidents are reported. Independent driving assessment is available to determine safety of driving.   If you are interested in the driving assessment, you can contact the following:  [employer] in [place]  [place] [phone] or [phone]    [place] refers to food and lifestyle choices that are based on the traditions of countries located on the [employer]. This way of eating has been shown to help prevent certain conditions and improve outcomes for people who have chronic diseases, like kidney disease and heart disease. What are tips for following this plan? Lifestyle  Cook and eat meals together with your family, when possible. Drink enough fluid to keep your urine clear or pale yellow. Be physically active every day. This includes: Aerobic exercise like running or swimming. Leisure activities like gardening, walking, or housework. Get 7-8 hours of sleep each night. If recommended by your health care provider, drink red wine in moderation. This means  1 glass a day for nonpregnant women and 2 glasses a day for men. A glass of wine equals 5 oz (150 mL). Reading food labels  Check the serving size of packaged foods. For foods such as rice and pasta, the serving size refers to the amount of cooked product, not dry. Check the total fat in packaged foods. Avoid foods that have saturated fat or trans  fats. Check the ingredients list for added sugars, such as corn syrup. Shopping  At the grocery store, buy most of your food from the areas near the walls of the store. This includes: Fresh fruits and vegetables (produce). Grains, beans, nuts, and seeds. Some of these may be available in unpackaged forms or large amounts (in bulk). Fresh seafood. Poultry and eggs. Low-fat dairy products. Buy whole ingredients instead of prepackaged foods. Buy fresh fruits and vegetables in-season from local farmers markets. Buy frozen fruits and vegetables in resealable bags. If you do not have access to quality fresh seafood, buy precooked frozen shrimp or canned fish, such as tuna, salmon, or sardines. Buy small amounts of raw or cooked vegetables, salads, or olives from the deli or salad bar at your store. Stock your pantry so you always have certain foods on hand, such as olive oil, canned tuna, canned tomatoes, rice, pasta, and beans. Cooking  Cook foods with extra-virgin olive oil instead of using butter or other vegetable oils. Have meat as a side dish, and have vegetables or grains as your main dish. This means having meat in small portions or adding small amounts of meat to foods like pasta or stew. Use beans or vegetables instead of meat in common dishes like chili or lasagna. Experiment with different cooking methods. Try roasting or broiling vegetables instead of steaming or sauteing them. Add frozen vegetables to soups, stews, pasta, or rice. Add nuts or seeds for added healthy fat at each meal. You can add these to yogurt, salads, or vegetable dishes. Marinate fish or vegetables using olive oil, lemon juice, garlic, and fresh herbs. Meal planning  Plan to eat 1 vegetarian meal one day each week. Try to work up to 2 vegetarian meals, if possible. Eat seafood 2 or more times a week. Have healthy snacks readily available, such as: Vegetable sticks with hummus. Greek yogurt. Fruit and nut  trail mix. Eat balanced meals throughout the week. This includes: Fruit: 2-3 servings a day Vegetables: 4-5 servings a day Low-fat dairy: 2 servings a day Fish, poultry, or lean meat: 1 serving a day Beans and legumes: 2 or more servings a week Nuts and seeds: 1-2 servings a day Whole grains: 6-8 servings a day Extra-virgin olive oil: 3-4 servings a day Limit red meat and sweets to only a few servings a month What are my food choices? Mediterranean diet Recommended Grains: Whole-grain pasta. Brown rice. Bulgar wheat. Polenta. Couscous. Whole-wheat bread. [place]. Vegetables: Artichokes. Beets. Broccoli. Cabbage. Carrots. Eggplant. Green beans. Chard. Kale. Spinach. Onions. Leeks. Peas. Squash. Tomatoes. Peppers. Radishes. Fruits: Apples. Apricots. Avocado. Berries. Bananas. Cherries. Dates. Figs. Grapes. Lemons. Melon. Oranges. Peaches. Plums. Pomegranate. Meats and other protein foods: Beans. Almonds. Sunflower seeds. Pine nuts. Peanuts. [place]. Salmon. Scallops. Shrimp. [place]. Tilapia. Clams. Oysters. Eggs. Dairy: Low-fat milk. Cheese. Greek yogurt. Beverages: Water. Red wine. Herbal tea. Fats and oils: Extra virgin olive oil. Avocado oil. Grape seed oil. Sweets and desserts: [place] yogurt with honey. Baked apples. Poached pears. Trail mix. Seasoning and other foods: Basil. Cilantro. Coriander. Cumin. Mint. Parsley. Sage. Rosemary. Tarragon.  Garlic. Oregano. Thyme. Pepper. Balsalmic vinegar. Tahini. Hummus. Tomato sauce. Olives. Mushrooms. Limit these Grains: Prepackaged pasta or rice dishes. Prepackaged cereal with added sugar. Vegetables: Deep fried potatoes (french fries). Fruits: Fruit canned in syrup. Meats and other protein foods: Beef. Pork. Lamb. Poultry with skin. Hot dogs. [place] Salines. Dairy: Ice cream. Sour cream. Whole milk. Beverages: Juice. Sugar-sweetened soft drinks. Beer. Liquor and spirits. Fats and oils: Butter. Canola oil. Vegetable oil. Beef fat (tallow).  Lard. Sweets and desserts: Cookies. Cakes. Pies. Candy. Seasoning and other foods: Mayonnaise. Premade sauces and marinades. The items listed may not be a complete list. Talk with your dietitian about what dietary choices are right for you. Summary The Mediterranean diet includes both food and lifestyle choices. Eat a variety of fresh fruits and vegetables, beans, nuts, seeds, and whole grains. Limit the amount of red meat and sweets that you eat. Talk with your health care provider about whether it is safe for you to drink red wine in moderation. This means 1 glass a day for nonpregnant women and 2 glasses a day for men. A glass of wine equals 5 oz (150 mL). This information is not intended to replace advice given to you by your health care provider. Make sure you discuss any questions you have with your health care provider. Document Released: 03/11/2016 Document Revised: 04/13/2016 Document Reviewed: 03/11/2016 Elsevier Interactive Patient Education  2017 [place] provider has requested that you have labwork completed today. Please go to [facility] ([id number]) on the second floor of this building before leaving the office today. You do not need to check in. If you are not called within 15 minutes please check with the front desk.  We have sent a referral to [facility] for your MRI and they will call you directly to schedule your appointment. They are located at [address]. If you need to contact them directly please call [phone].

## 2022-05-12 NOTE — Progress Notes (Signed)
Assessment/Plan:    The patient is seen in neurologic consultation at the request of Plotnikov, Evie Lacks, MD for the evaluation of memory.  Morgan Huff is a very pleasant 76 y.o. year old RH female with  a history of hypertension, hyperlipidemia, osteoarthritis seen today for evaluation of memory loss. MoCA today is 16/30 .of note, in the wellness exam for Medicare in September 2019, her MMSE was 30/30.  Delayed recall 2/5.  Although patient denies any significant changes, and she reports being under a lot of stress, clinical findings including apathy -anosognosia are concerning for dementia due to Alzheimer's disease.  Patient has been started on memantine 5 mg bid as per PCP (05/03/22).  Also, simvastatin is on hold to monitor for any reversible changes of her cognitive status.  Dementia likely due to Alzheimer's Disease   MRI brain without contrast to assess for underlying structural abnormality and assess vascular load  Check B12  Monitor driving Agree with holding Simvastatin for a few weeks to monitor for cognitive improvement Patient is currently on Memantine  5 mg bid as per PCP (05/03/22)  Recommend psychotherapy /CBT for stress management Recommend good control of cardiovascular risk factors.  Recommend baby aspirin daily Continue to control mood as per PCP Folllow up 1 months  Subjective:    The patient is accompanied by her sister  who supplements the history.    How long did patient have memory difficulties?  "I don't until my sister was mentioning ". She reports decreased concentration.  "In the middle of a story I forget what I was talking about ". Learning names of people is harder. LTM is normal according to her. repeats oneself? Denies  Disoriented when walking into a room?  Patient denies   Leaving objects in unusual places?  Patient denies   Patient lives  [place] lives alone Ambulates  with difficulty?   Patient has a history of arthritis, "bad he ",  which may limit her ability to ambulate. Recent falls?  Patient denies   Any head injuries?  Patient denies   History of seizures?   Patient denies   Wandering behavior?  Patient denies   Patient drives?  "I get a little more cautions finding a familiar road".  She reports that has been living in [place] her life, and there is so much construction, but at times it may become more difficult, but she denies being disoriented. Any mood changes ? She denies, but her sister states that "She wakes up in the middle of the night to take a shower, there is distortion with the concept of time".  Patient denies, stating if she is already, and she has to be somewhere, "why not to take a shower?  " Any depression?:  Patient denies. She has anxiety, undergoing "a lot of stress".  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 Hallucinations?  Patient denies   Paranoia?  Patient denies   Patient reports that sleeps well without vivid dreams, REM behavior or sleepwalking    History of sleep apnea?  Patient denies   Any hygiene concerns?  Patient denies   Independent of bathing and dressing?  Endorsed  Does the patient needs help with medications? Patient in charge, denies forgetting any doses Who is in charge of the finances? Patient  is in charge   Any changes in appetite?  Patient denies   Patient have trouble swallowing? Patient denies  Does the patient cook?  Patient denies   Any kitchen accidents such as leaving the stove on? Patient denies   Any headaches?  Patient denies   Double vision? Patient denies   Any focal numbness or tingling?  Patient denies   Chronic back pain Patient denies   Unilateral weakness?  Patient denies   Any tremors?  Patient denies   Any history of anosmia?  Patient denies   Any incontinence of urine?  She has a history of OAB on Myrbetriq. Uses a pad "just in case Any bowel dysfunction?   History of  constipation  History of heavy alcohol intake?  Patient denies   History of heavy tobacco use?  Patient denies   Family history of dementia?   Denies  Pertinent labs: TSH 2.6, A1C 6.1 nl CBC and CMP  Allergies  Allergen Reactions   Latex Rash    Red rash around red bandaid    Current Outpatient Medications  Medication Instructions   aspirin EC 81 mg, Oral, Daily, Swallow whole.   Biotin 5,000 mg, Oral, Daily, Actually 5000 mcg -1 capsule daily   brimonidine-timolol (COMBIGAN) 0.2-0.5 % ophthalmic solution Ophthalmic   calcium carbonate (TUMS - DOSED IN MG ELEMENTAL CALCIUM) 500 MG chewable tablet 2 tablets, Oral, Daily   cholecalciferol (VITAMIN D) 2,000 Units, Oral, Daily   diclofenac Sodium (VOLTAREN) 1 % GEL Topical, As needed   ibuprofen (ADVIL) 200 mg, Oral, Every 6 hours PRN   influenza vaccine adjuvanted (FLUAD QUADRIVALENT) 0.5 ML injection Intramuscular   ipratropium (ATROVENT) 0.06 % nasal spray 2 sprays, Nasal, 3 times daily   latanoprost (XALATAN) 0.005 % ophthalmic solution 1 drop, Left Eye, Daily at bedtime   memantine (NAMENDA) 5 mg, Oral, 2 times daily   mirabegron ER (MYRBETRIQ) 50 mg, Oral, Daily   Multiple Vitamins-Minerals (CENTRUM SILVER PO) 1 tablet, Oral, Daily,     Polyethyl Glycol-Propyl Glycol 0.4-0.3 % SOLN 1-2 drops, Both Eyes, 3 times daily PRN   simvastatin (ZOCOR) 10 mg, Oral, Daily   traMADol (ULTRAM) 50 MG tablet Oral, Every 8 hours PRN   Vitamin C 500 mg, Oral, Daily,       VITALS:  There were no vitals filed for this visit.    08/06/2021    3:10 PM 04/29/2021    3:24 PM 04/23/2020    4:14 PM 10/22/2019    2:06 PM 04/14/2018    3:04 PM  Depression screen PHQ 2/9  Decreased Interest 0 0 0 0 0  Down, Depressed, Hopeless 0 0 1 0 0  PHQ - 2 Score 0 0 1 0 0  Altered sleeping  0     Tired, decreased energy  0     Change in appetite  0     Feeling bad or failure about yourself   0     Trouble concentrating  0     Moving slowly or  fidgety/restless  0     Suicidal thoughts  0     PHQ-9 Score  0       PHYSICAL EXAM   HEENT:  Normocephalic, atraumatic. The mucous membranes are moist. The superficial temporal arteries are without ropiness or tenderness. Cardiovascular: Regular rate and rhythm. Lungs: Clear to auscultation bilaterally. Neck: There are no carotid bruits noted bilaterally.  NEUROLOGICAL:     No data to display             04/13/2017    3:46 PM  MMSE - Mini Mental State Exam  Orientation to time 5  Orientation to Place 5  Registration 3  Attention/ Calculation 5  Recall 3  Language- name 2 objects 2  Language- repeat 1  Language- follow 3 step command 3  Language- read & follow direction 1  Write a sentence 1  Copy design 1  Total score 30     Orientation:  Alert and oriented to person, place and time.   No aphasia or dysarthria. Fund of knowledge is appropriate. Recent and remote memory impaired.  Attention and concentration are normal.  Unable to name objects and repeat phrases. Delayed recall 2/6 Cranial nerves: There is good facial symmetry.  Flat affect extraocular muscles are intact and visual fields are full to confrontational testing. Speech is fluent and clear. Soft palate rises symmetrically and there is no tongue deviation. Hearing is intact to conversational tone. Tone: Tone is good throughout. Sensation: Sensation is intact to light touch and pinprick throughout. Vibration is intact at the bilateral big toe.There is no extinction with double simultaneous stimulation. There is no sensory dermatomal level identified. Coordination: The patient has no difficulty with RAM's or FNF bilaterally. Normal finger to nose  Motor: Strength is 5/5 in the bilateral upper and lower extremities. There is no pronator drift. There are no fasciculations noted. DTR's: Deep tendon reflexes are 2/4 at the bilateral biceps, triceps, brachioradialis, patella and achilles.  Plantar responses are downgoing  bilaterally. Gait and Station: The patient is able to ambulate without difficulty.The patient is able to heel toe walk without any difficulty, able to ambulate in a tandem fashion, able to stand in the Romberg position.     Thank you for allowing [place] the opportunity to participate in the care of this nice patient. Please do not hesitate to contact us for any questions or concerns.   Total time spent on today's visit was 60 minutes dedicated to this patient today, preparing to see patient, examining the patient, ordering tests and/or medications and counseling the patient, documenting clinical information in the EHR or other health record, independently interpreting results and communicating results to the patient/family, discussing treatment and goals, answering patient's questions and coordinating care.  Cc:  Plotnikov, Evie Lacks, MD  Sharene Butters 05/12/2022 6:51 AM

## 2022-05-13 LAB — VITAMIN B12: Vitamin B-12: 478 pg/mL (ref 211–911)

## 2022-05-13 NOTE — Progress Notes (Signed)
B12 is normal, thanks

## 2022-05-14 ENCOUNTER — Other Ambulatory Visit (HOSPITAL_BASED_OUTPATIENT_CLINIC_OR_DEPARTMENT_OTHER): Payer: Self-pay

## 2022-05-14 DIAGNOSIS — R350 Frequency of micturition: Secondary | ICD-10-CM | POA: Diagnosis not present

## 2022-05-14 DIAGNOSIS — R3915 Urgency of urination: Secondary | ICD-10-CM | POA: Diagnosis not present

## 2022-05-14 DIAGNOSIS — R351 Nocturia: Secondary | ICD-10-CM | POA: Diagnosis not present

## 2022-05-17 ENCOUNTER — Other Ambulatory Visit (HOSPITAL_BASED_OUTPATIENT_CLINIC_OR_DEPARTMENT_OTHER): Payer: Self-pay

## 2022-05-20 ENCOUNTER — Other Ambulatory Visit (HOSPITAL_BASED_OUTPATIENT_CLINIC_OR_DEPARTMENT_OTHER): Payer: Self-pay

## 2022-05-24 DIAGNOSIS — Z01419 Encounter for gynecological examination (general) (routine) without abnormal findings: Secondary | ICD-10-CM | POA: Diagnosis not present

## 2022-05-24 DIAGNOSIS — Z6832 Body mass index (BMI) 32.0-32.9, adult: Secondary | ICD-10-CM | POA: Diagnosis not present

## 2022-06-01 ENCOUNTER — Ambulatory Visit
Admission: RE | Admit: 2022-06-01 | Discharge: 2022-06-01 | Disposition: A | Discharge status: Home / Self Care | Payer: Medicare Other | Source: Ambulatory Visit | Attending: Physician Assistant | Admitting: Physician Assistant

## 2022-06-01 DIAGNOSIS — G319 Degenerative disease of nervous system, unspecified: Secondary | ICD-10-CM | POA: Diagnosis not present

## 2022-06-01 DIAGNOSIS — R2681 Unsteadiness on feet: Secondary | ICD-10-CM | POA: Diagnosis not present

## 2022-06-01 DIAGNOSIS — Z8673 Personal history of transient ischemic attack (TIA), and cerebral infarction without residual deficits: Secondary | ICD-10-CM | POA: Diagnosis not present

## 2022-06-01 DIAGNOSIS — R413 Other amnesia: Secondary | ICD-10-CM | POA: Diagnosis not present

## 2022-06-02 DIAGNOSIS — I6381 Other cerebral infarction due to occlusion or stenosis of small artery: Secondary | ICD-10-CM

## 2022-06-02 DIAGNOSIS — Z23 Encounter for immunization: Secondary | ICD-10-CM | POA: Diagnosis not present

## 2022-06-02 HISTORY — DX: Other cerebral infarction due to occlusion or stenosis of small artery: I63.81

## 2022-06-03 ENCOUNTER — Other Ambulatory Visit (HOSPITAL_BASED_OUTPATIENT_CLINIC_OR_DEPARTMENT_OTHER): Payer: Self-pay

## 2022-06-14 ENCOUNTER — Encounter: Payer: Self-pay | Admitting: Physician Assistant

## 2022-06-14 ENCOUNTER — Ambulatory Visit (INDEPENDENT_AMBULATORY_CARE_PROVIDER_SITE_OTHER): Payer: Medicare Other | Admitting: Physician Assistant

## 2022-06-14 ENCOUNTER — Other Ambulatory Visit (HOSPITAL_BASED_OUTPATIENT_CLINIC_OR_DEPARTMENT_OTHER): Payer: Self-pay

## 2022-06-14 VITALS — BP 153/89 | HR 85 | Ht 74.0 in | Wt 252.2 lb

## 2022-06-14 DIAGNOSIS — G3184 Mild cognitive impairment, so stated: Secondary | ICD-10-CM

## 2022-06-14 DIAGNOSIS — I6381 Other cerebral infarction due to occlusion or stenosis of small artery: Secondary | ICD-10-CM | POA: Diagnosis not present

## 2022-06-14 DIAGNOSIS — R413 Other amnesia: Secondary | ICD-10-CM

## 2022-06-14 DIAGNOSIS — G459 Transient cerebral ischemic attack, unspecified: Secondary | ICD-10-CM

## 2022-06-14 MED ORDER — DONEPEZIL HCL 5 MG PO TABS: 5.0000 mg | ORAL_TABLET | Freq: Every day | ORAL | 11 refills | Status: DC

## 2022-06-14 NOTE — Progress Notes (Unsigned)
Assessment/Plan:   Mild Cognitive Impairment of unclear etiology, suspect vascular  Morgan Huff is a very pleasant 76 y.o. RH female  with  a history of hypertension, hyperlipidemia, osteoarthritis seen today for evaluation of memory loss seen today in follow up to discuss the 06/2022 MRI of the brain results. These were personally reviewed, remarkable for small chronic lacunar infarcts within the corona radiata, bilaterally, mild chronic small vessel ischemic disease, probable posterior fossa arachnoid cyst, and mild generalized cerebral atrophy.  Since her last visit however, she is more conversant, and her overall status appears improved, attributing it as having quit her job, feeling less stressed.  Patient is currently on memantine 5 mg bid, bad since starting the medication, she reports some headaches and constipation.  She denies any other symptoms associated with the medication but she is interested in changing it and trying some other agent.     Follow up in 6   months. Discontinue memantine and start donepezil 5 mg nightly in about a week. Continue to control mood as per PCP Recommend good control of cardiovascular risk factors.  Recommend baby aspirin daily Recommend psychotherapy for stress management Neuropsych evaluation for clarity of the diagnosis and to determine any other causes of memory difficulties including depression, and anxiety. Obtain bilateral carotid ultrasound giving her incidental finding in MRI of bilateral small chronic lacunar infarcts within the corona radiata.  Patient is not interested at this time on invasive procedures.    Subjective:    This patient is accompanied in the office by her sister who supplements the history.  Previous records as well as any outside records available were reviewed prior to todays visit. Last seen on 05/13/22 at which time her MoCA was 16/30   Any changes in memory since last visit?  Patient feels that her memory is  better after discontinuing her job, free more time, and she is feeling less stressed.  She is able to hold a conversation without any difficulty. Tolerating meds?  She did not tolerate memantine well, "as soon as I started to take it, he developed headaches and constipation ".  She is interested in another agent if possible.  Initial Visit 05/13/22  How long did patient have memory difficulties?  "I don't until my sister was mentioning ". She reports decreased concentration.  "In the middle of a story I forget what I was talking about ". Learning names of people is harder. LTM is normal according to her. repeats oneself? Denies  Disoriented when walking into a room?  Patient denies   Leaving objects in unusual places?  Patient denies   Patient lives  [place] alone Ambulates  with difficulty?   Patient has a history of arthritis, "bad he ", which may limit her ability to ambulate. Recent falls?  Patient denies   Any head injuries?  Patient denies   History of seizures?   Patient denies   Wandering behavior?  Patient denies   Patient drives?  "I get a little more cautions finding a familiar road".  She reports that has been living in [place] her life, and there is so much construction, but at times it may become more difficult, but she denies being disoriented. Any mood changes ? She denies, but her sister states that "She wakes up in the middle of the night to take a shower, there is distortion with the concept of time".  Patient denies, stating if she is already, and she has to be somewhere, "why not  to take a shower?  " Any depression?:  Patient denies. She has anxiety, undergoing "a lot of stress".  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 Hallucinations?  Patient denies   Paranoia?  Patient denies   Patient reports that sleeps well without vivid dreams, REM behavior or sleepwalking    History of sleep  apnea?  Patient denies   Any hygiene concerns?  Patient denies   Independent of bathing and dressing?  Endorsed  Does the patient needs help with medications? Patient in charge, denies forgetting any doses Who is in charge of the finances? Patient  is in charge   Any changes in appetite?  Patient denies   Patient have trouble swallowing? Patient denies   Does the patient cook?  Patient denies   Any kitchen accidents such as leaving the stove on? Patient denies   Any headaches?  Patient denies   Double vision? Patient denies   Any focal numbness or tingling?  Patient denies   Chronic back pain Patient denies   Unilateral weakness?  Patient denies   Any tremors?  Patient denies   Any history of anosmia?  Patient denies   Any incontinence of urine?  She has a history of OAB on Myrbetriq. Uses a pad "just in case Any bowel dysfunction?   History of constipation  History of heavy alcohol intake?  Patient denies   History of heavy tobacco use?  Patient denies   Family history of dementia?   Denies   Pertinent labs: TSH 2.6, A1C 6.1 nl CBC and CMP, B12 478  MRI brain was remarkable for   CURRENT MEDICATIONS:  Outpatient Encounter Medications as of 06/14/2022  Medication Sig   Ascorbic Acid (VITAMIN C) 500 MG CAPS Take 500 mg by mouth daily.   aspirin 81 MG EC tablet Take 81 mg by mouth daily. Swallow whole.   Biotin 5 MG TABS Take 5,000 mg by mouth daily. Actually 5000 mcg -1 capsule daily   brimonidine-timolol (COMBIGAN) 0.2-0.5 % ophthalmic solution Apply to eye.   calcium carbonate (TUMS - DOSED IN MG ELEMENTAL CALCIUM) 500 MG chewable tablet Chew 2 tablets by mouth daily.    cholecalciferol (VITAMIN D) 1000 units tablet Take 2 tablets (2,000 Units total) by mouth daily.   diclofenac Sodium (VOLTAREN) 1 % GEL Apply topically as needed.   ibuprofen (ADVIL) 200 MG tablet Take 200 mg by mouth every 6 (six) hours as needed.   influenza vaccine adjuvanted (FLUAD QUADRIVALENT) 0.5 ML  injection Inject into the muscle.   ipratropium (ATROVENT) 0.06 % nasal spray Place 2 sprays into the nose 3 (three) times daily.   latanoprost (XALATAN) 0.005 % ophthalmic solution Place 1 drop into the left eye at bedtime.   memantine (NAMENDA) 5 MG tablet Take 1 tablet (5 mg total) by mouth 2 (two) times daily. (Patient not taking: Reported on 05/12/2022)   mirabegron ER (MYRBETRIQ) 50 MG TB24 tablet Take 1 tablet (50 mg total) by mouth daily.   Multiple Vitamins-Minerals (CENTRUM SILVER PO) Take 1 tablet by mouth daily.   Polyethyl Glycol-Propyl Glycol 0.4-0.3 % SOLN Place 1-2 drops into both eyes 3 (three) times daily as needed (for dry eyes).    simvastatin (ZOCOR) 10 MG tablet Take 1 tablet (10 mg total) by mouth daily. (Patient not taking: Reported on 05/12/2022)   traMADol (ULTRAM) 50 MG tablet Take by mouth every 8 (eight) hours as needed.  No facility-administered encounter medications on file as of 06/14/2022.       04/13/2017    3:46 PM  MMSE - Mini Mental State Exam  Orientation to time 5  Orientation to Place 5  Registration 3  Attention/ Calculation 5  Recall 3  Language- name 2 objects 2  Language- repeat 1  Language- follow 3 step command 3  Language- read & follow direction 1  Write a sentence 1  Copy design 1  Total score 30      05/13/2022   11:00 AM  Montreal Cognitive Assessment   Visuospatial/ Executive (0/5) 2  Naming (0/3) 0  Attention: Read list of digits (0/2) 2  Attention: Read list of letters (0/1) 1  Attention: Serial 7 subtraction starting at 100 (0/3) 0  Language: Repeat phrase (0/2) 2  Language : Fluency (0/1) 1  Abstraction (0/2) 0  Delayed Recall (0/5) 2  Orientation (0/6) 6  Total 16  Adjusted Score (based on education) 16   Thank you for allowing [place] the opportunity to participate in the care of this nice patient. Please do not hesitate to contact us for any questions or concerns.   Total time spent on today's visit was 58 minutes  dedicated to this patient today, preparing to see patient, examining the patient, ordering tests and/or medications and counseling the patient, documenting clinical information in the EHR or other health record, independently interpreting results and communicating results to the patient/family, discussing treatment and goals, answering patient's questions and coordinating care.  Cc:  Plotnikov, Evie Lacks, MD  Sharene Butters 06/14/2022 2:01 PM

## 2022-06-14 NOTE — Patient Instructions (Addendum)
It was a pleasure to see you today at our office.   Recommendations:  Follow up  April 25 at 3:30 Change to Donepezil 5 mg daily Recommend good control of cardiovascular risk factors.   Carotid Ultrasound  Neuropsych evaluation    Whom to call:  Memory  decline, memory medications: Call our office [phone]   For psychiatric meds, mood meds: Please have your primary care physician manage these medications.   Counseling regarding caregiver distress, including caregiver depression, anxiety and issues regarding community resources, adult day care programs, adult living facilities, or memory care questions:   Feel free to contact [place], Social Worker at [phone]   For assessment of decision of mental capacity and competency:  Call Dr. Anthoney Harada, geriatric psychiatrist at [phone]  For guidance in geriatric dementia issues please call [facility] [phone]  For guidance regarding [facility] and if placement were needed at the facility, contact Arnell Asal, Social Worker tel: [phone]  If you have any severe symptoms of a stroke, or other severe issues such as confusion,severe chills or fever, etc call 911 or go to the ER as you may need to be evaluated further    Feel free to go to the following database for funded clinical studies conducted around the world: http://saunders.com/   https://www.triadclinicaltrials.com/     RECOMMENDATIONS FOR ALL PATIENTS WITH MEMORY PROBLEMS: 1. Continue to exercise (Recommend 30 minutes of walking everyday, or 3 hours every week) 2. Increase social interactions - continue going to [place] and enjoy social gatherings with friends and family 3. Eat healthy, avoid fried foods and eat more fruits and vegetables 4. Maintain adequate blood pressure, blood sugar, and blood cholesterol level. Reducing the risk of stroke and cardiovascular disease also helps promoting better  memory. 5. Avoid stressful situations. Live a simple life and avoid aggravations. Organize your time and prepare for the next day in anticipation. 6. Sleep well, avoid any interruptions of sleep and avoid any distractions in the bedroom that may interfere with adequate sleep quality 7. Avoid sugar, avoid sweets as there is a strong link between excessive sugar intake, diabetes, and cognitive impairment We discussed the Mediterranean diet, which has been shown to help patients reduce the risk of progressive memory disorders and reduces cardiovascular risk. This includes eating fish, eat fruits and green leafy vegetables, nuts like almonds and hazelnuts, walnuts, and also use olive oil. Avoid fast foods and fried foods as much as possible. Avoid sweets and sugar as sugar use has been linked to worsening of memory function.  There is always a concern of gradual progression of memory problems. If this is the case, then we may need to adjust level of care according to patient needs. Support, both to the patient and caregiver, should then be put into place.    FALL PRECAUTIONS: Be cautious when walking. Scan the area for obstacles that may increase the risk of trips and falls. When getting up in the mornings, sit up at the edge of the bed for a few minutes before getting out of bed. Consider elevating the bed at the head end to avoid drop of blood pressure when getting up. Walk always in a well-lit room (use night lights in the walls). Avoid area rugs or power cords from appliances in the middle of the walkways. Use a walker or a cane if necessary and consider physical therapy for balance exercise. Get your eyesight checked regularly.  FINANCIAL OVERSIGHT: Supervision, especially oversight when  making financial decisions or transactions is also recommended.  HOME SAFETY: Consider the safety of the kitchen when operating appliances like stoves, microwave oven, and blender. Consider having supervision and share  cooking responsibilities until no longer able to participate in those. Accidents with firearms and other hazards in the house should be identified and addressed as well.   ABILITY TO BE LEFT ALONE: If patient is unable to contact 911 operator, consider using [facility], or when the need is there, arrange for someone to stay with patients. Smoking is a fire hazard, consider supervision or cessation. Risk of wandering should be assessed by caregiver and if detected at any point, supervision and safe proof recommendations should be instituted.  MEDICATION SUPERVISION: Inability to self-administer medication needs to be constantly addressed. Implement a mechanism to ensure safe administration of the medications.   DRIVING: Regarding driving, in patients with progressive memory problems, driving will be impaired. We advise to have someone else do the driving if trouble finding directions or if minor accidents are reported. Independent driving assessment is available to determine safety of driving.   If you are interested in the driving assessment, you can contact the following:  [facility] [employer] in [place]  [place] [phone]  [place]  [facility] [phone] or [phone]

## 2022-06-15 ENCOUNTER — Other Ambulatory Visit (HOSPITAL_BASED_OUTPATIENT_CLINIC_OR_DEPARTMENT_OTHER): Payer: Self-pay

## 2022-06-15 ENCOUNTER — Telehealth: Payer: Self-pay | Admitting: Physician Assistant

## 2022-06-15 NOTE — Telephone Encounter (Signed)
The following message was left with [facility] on 06/15/22 at 12:22 PM.  Caller states she has a question about an Rx her sister was prescribed. Morgan Huff is taking Myrdetriq or overactive bladder. But, there is a history of it causing confusion and her sister is concerned about that.

## 2022-06-15 NOTE — Telephone Encounter (Signed)
Contacted patient and thanked me for calling.

## 2022-06-22 DIAGNOSIS — H401331 Pigmentary glaucoma, bilateral, mild stage: Secondary | ICD-10-CM | POA: Diagnosis not present

## 2022-06-23 ENCOUNTER — Telehealth: Payer: Self-pay | Admitting: Anesthesiology

## 2022-06-23 NOTE — Telephone Encounter (Signed)
Called patients sister and left message about appointment but did not want to leave a detailed message about her medication. I did include  that we are closing at 2 today to try to call me back before then

## 2022-06-23 NOTE — Telephone Encounter (Signed)
Donepezil is the medication that patient is at [place] at independent living. Patient was banging on the door at the Drs office not understanding why they are closed. They want to see Morgan Huff earlier than the scheduled appointment time

## 2022-06-23 NOTE — Telephone Encounter (Signed)
Pt's sister Mickel Baas called stating that patient has been more confused than before. She is wondering if the medication she is taking has anything to do with that. The patient drove herself to a doctors appointment at 2 am instead of 2 pm. She requests a call back.

## 2022-06-23 NOTE — Telephone Encounter (Signed)
Appointment scheduled and confirmed with Sister she is contacting the PCP in the meantime and D/C donepezil to see if that is what is causing the issues

## 2022-06-28 ENCOUNTER — Other Ambulatory Visit: Payer: Medicare Other

## 2022-06-28 ENCOUNTER — Ambulatory Visit
Admission: RE | Admit: 2022-06-28 | Discharge: 2022-06-28 | Disposition: A | Discharge status: Home / Self Care | Payer: Medicare Other | Source: Ambulatory Visit | Attending: Physician Assistant | Admitting: Physician Assistant

## 2022-06-28 ENCOUNTER — Other Ambulatory Visit (HOSPITAL_BASED_OUTPATIENT_CLINIC_OR_DEPARTMENT_OTHER): Payer: Self-pay

## 2022-06-28 DIAGNOSIS — I6381 Other cerebral infarction due to occlusion or stenosis of small artery: Secondary | ICD-10-CM

## 2022-06-28 DIAGNOSIS — I6523 Occlusion and stenosis of bilateral carotid arteries: Secondary | ICD-10-CM | POA: Diagnosis not present

## 2022-06-28 DIAGNOSIS — Z23 Encounter for immunization: Secondary | ICD-10-CM | POA: Diagnosis not present

## 2022-06-28 DIAGNOSIS — R413 Other amnesia: Secondary | ICD-10-CM

## 2022-06-28 NOTE — Progress Notes (Signed)
The arteries I the neck are essentially normal. No significant narrowing  Thanks

## 2022-06-30 NOTE — Telephone Encounter (Signed)
Pt called in stating she is returning Christy's call from yesterday

## 2022-06-30 NOTE — Telephone Encounter (Signed)
Pt left message with after hour service on  06-30-22 at 12:19  needing to speak with [facility]

## 2022-07-13 ENCOUNTER — Ambulatory Visit: Payer: Medicare Other | Admitting: Nurse Practitioner

## 2022-07-19 ENCOUNTER — Telehealth: Payer: Self-pay | Admitting: Internal Medicine

## 2022-07-19 NOTE — Telephone Encounter (Signed)
Patient wants to know if she can get the rsv shot - she had covid but she is out of quarantine - please advise.  Patient's number:  [phone]

## 2022-07-21 NOTE — Telephone Encounter (Signed)
Okay to get RSV vaccine in a couple weeks.    Thanks

## 2022-07-21 NOTE — Telephone Encounter (Signed)
Notified pt w/MD response.../lmb 

## 2022-08-03 ENCOUNTER — Encounter: Payer: Self-pay | Admitting: Internal Medicine

## 2022-08-03 ENCOUNTER — Ambulatory Visit (INDEPENDENT_AMBULATORY_CARE_PROVIDER_SITE_OTHER): Payer: Medicare Other | Admitting: Internal Medicine

## 2022-08-03 VITALS — BP 136/82 | HR 76 | Temp 98.7°F | Ht 74.0 in | Wt 247.0 lb

## 2022-08-03 DIAGNOSIS — R109 Unspecified abdominal pain: Secondary | ICD-10-CM

## 2022-08-03 DIAGNOSIS — I2583 Coronary atherosclerosis due to lipid rich plaque: Secondary | ICD-10-CM | POA: Diagnosis not present

## 2022-08-03 DIAGNOSIS — K5904 Chronic idiopathic constipation: Secondary | ICD-10-CM

## 2022-08-03 DIAGNOSIS — I2510 Atherosclerotic heart disease of native coronary artery without angina pectoris: Secondary | ICD-10-CM

## 2022-08-03 DIAGNOSIS — K5900 Constipation, unspecified: Secondary | ICD-10-CM | POA: Insufficient documentation

## 2022-08-03 DIAGNOSIS — J3089 Other allergic rhinitis: Secondary | ICD-10-CM | POA: Diagnosis not present

## 2022-08-03 DIAGNOSIS — J309 Allergic rhinitis, unspecified: Secondary | ICD-10-CM | POA: Insufficient documentation

## 2022-08-03 DIAGNOSIS — E783 Hyperchylomicronemia: Secondary | ICD-10-CM

## 2022-08-03 HISTORY — DX: Allergic rhinitis, unspecified: J30.9

## 2022-08-03 HISTORY — DX: Constipation, unspecified: K59.00

## 2022-08-03 HISTORY — DX: Unspecified abdominal pain: R10.9

## 2022-08-03 LAB — COMPREHENSIVE METABOLIC PANEL
ALT: 17 U/L (ref 0–35)
AST: 20 U/L (ref 0–37)
Albumin: 4.2 g/dL (ref 3.5–5.2)
Alkaline Phosphatase: 62 U/L (ref 39–117)
BUN: 26 mg/dL — ABNORMAL HIGH (ref 6–23)
CO2: 28 mEq/L (ref 19–32)
Calcium: 10 mg/dL (ref 8.4–10.5)
Chloride: 105 mEq/L (ref 96–112)
Creatinine, Ser: 0.79 mg/dL (ref 0.40–1.20)
GFR: 72.48 mL/min (ref >60.00)
Glucose, Bld: 94 mg/dL (ref 70–99)
Potassium: 4.2 mEq/L (ref 3.5–5.1)
Sodium: 141 mEq/L (ref 135–145)
Total Bilirubin: 0.6 mg/dL (ref 0.2–1.2)
Total Protein: 7.2 g/dL (ref 6.0–8.3)

## 2022-08-03 LAB — CBC WITH DIFFERENTIAL/PLATELET
Basophils Absolute: 0 10*3/uL (ref 0.0–0.1)
Basophils Relative: 0.5 % (ref 0.0–3.0)
Eosinophils Absolute: 0.2 10*3/uL (ref 0.0–0.7)
Eosinophils Relative: 2.3 % (ref 0.0–5.0)
HCT: 44 % (ref 36.0–46.0)
Hemoglobin: 14.6 g/dL (ref 12.0–15.0)
Lymphocytes Relative: 27.2 % (ref 12.0–46.0)
Lymphs Abs: 2.1 10*3/uL (ref 0.7–4.0)
MCHC: 33.1 g/dL (ref 30.0–36.0)
MCV: 91.5 fl (ref 78.0–100.0)
Monocytes Absolute: 0.8 10*3/uL (ref 0.1–1.0)
Monocytes Relative: 10.2 % (ref 3.0–12.0)
Neutro Abs: 4.7 10*3/uL (ref 1.4–7.7)
Neutrophils Relative %: 59.8 % (ref 43.0–77.0)
Platelets: 288 10*3/uL (ref 150.0–400.0)
RBC: 4.81 Mil/uL (ref 3.87–5.11)
RDW: 14 % (ref 11.5–15.5)
WBC: 7.9 10*3/uL (ref 4.0–10.5)

## 2022-08-03 LAB — T4, FREE: Free T4: 1.02 ng/dL (ref 0.60–1.60)

## 2022-08-03 LAB — TSH: TSH: 2.49 u[IU]/mL (ref 0.35–5.50)

## 2022-08-03 MED ORDER — GLYCOPYRROLATE 2 MG PO TABS: 2.0000 mg | ORAL_TABLET | Freq: Three times a day (TID) | ORAL | 3 refills | Status: DC | PRN

## 2022-08-03 MED ORDER — SENNOSIDES 8.6 MG PO TABS: 1.0000 | ORAL_TABLET | Freq: Every day | ORAL | 3 refills | Status: DC | PRN

## 2022-08-03 MED ORDER — LORATADINE 10 MG PO TABS: 10.0000 mg | ORAL_TABLET | Freq: Every day | ORAL | 3 refills | Status: DC

## 2022-08-03 MED ORDER — POLYETHYLENE GLYCOL 3350 17 GM/SCOOP PO POWD: 17.0000 g | Freq: Every day | ORAL | 5 refills | Status: DC

## 2022-08-03 MED ORDER — PRAVASTATIN SODIUM 10 MG PO TABS: 10.0000 mg | ORAL_TABLET | Freq: Every day | ORAL | 3 refills | Status: DC

## 2022-08-03 NOTE — Assessment & Plan Note (Addendum)
Chronic runny nose - Atrovent did not work Start Loratidine 10 mg/d

## 2022-08-03 NOTE — Assessment & Plan Note (Signed)
Chronic abdominal cramps - periodic. Worse. Treat constipation. (Pt saw Dr Watt Climes, had an MRI).

## 2022-08-03 NOTE — Assessment & Plan Note (Addendum)
Chronic abdominal cramps - periodic.  Worse. (Pt saw Dr Watt Climes, had an MRI). Robinul prn

## 2022-08-03 NOTE — Assessment & Plan Note (Signed)
Off statin

## 2022-08-03 NOTE — Assessment & Plan Note (Signed)
CT coronary calcium score of 302. Pravastatin to start

## 2022-08-03 NOTE — Progress Notes (Signed)
Subjective:  Patient ID: Morgan Huff, female    DOB: 05-25-1946  Age: 77 y.o. MRN: [id number]  CC: Follow-up   HPI Morgan Huff presents for abdominal cramps - periodic (Pt saw Dr Watt Climes, had an MRI). C/o constipation.   C/o runny nose - Atrovent did not work  She is here w/her sister Morgan Huff  F/u CAD    Outpatient Medications Prior to Visit  Medication Sig Dispense Refill   aspirin 81 MG EC tablet Take 81 mg by mouth daily. Swallow whole.     Biotin 5 MG TABS Take 5,000 mg by mouth daily. Actually 5000 mcg -1 capsule daily     brimonidine (ALPHAGAN) 0.2 % ophthalmic solution SMARTSIG:In Eye(s)     brimonidine-timolol (COMBIGAN) 0.2-0.5 % ophthalmic solution Apply to eye.     calcium carbonate (TUMS - DOSED IN MG ELEMENTAL CALCIUM) 500 MG chewable tablet Chew 2 tablets by mouth daily.      cholecalciferol (VITAMIN D) 1000 units tablet Take 2 tablets (2,000 Units total) by mouth daily. 100 tablet 3   diclofenac Sodium (VOLTAREN) 1 % GEL Apply topically as needed.     ibuprofen (ADVIL) 200 MG tablet Take 200 mg by mouth every 6 (six) hours as needed.     influenza vaccine adjuvanted (FLUAD QUADRIVALENT) 0.5 ML injection Inject into the muscle. 0.5 mL 0   mirabegron ER (MYRBETRIQ) 50 MG TB24 tablet Take 1 tablet (50 mg total) by mouth daily. 30 tablet 11   Multiple Vitamins-Minerals (CENTRUM SILVER PO) Take 1 tablet by mouth daily.     Polyethyl Glycol-Propyl Glycol 0.4-0.3 % SOLN Place 1-2 drops into both eyes 3 (three) times daily as needed (for dry eyes).      timolol (TIMOPTIC) 0.5 % ophthalmic solution SMARTSIG:In Eye(s)     traMADol (ULTRAM) 50 MG tablet Take by mouth every 8 (eight) hours as needed.     Ascorbic Acid (VITAMIN C) 500 MG CAPS Take 500 mg by mouth daily. (Patient not taking: Reported on 06/14/2022)     ipratropium (ATROVENT) 0.06 % nasal spray Place 2 sprays into the nose 3 (three) times daily. (Patient not taking: Reported on 06/14/2022) 15 mL 3    latanoprost (XALATAN) 0.005 % ophthalmic solution Place 1 drop into the left eye at bedtime. (Patient not taking: Reported on 08/03/2022)     donepezil (ARICEPT) 5 MG tablet Take 1 tablet (5 mg total) by mouth at bedtime. (Patient not taking: Reported on 08/03/2022) 30 tablet 11   simvastatin (ZOCOR) 10 MG tablet Take 1 tablet (10 mg total) by mouth daily. (Patient not taking: Reported on 06/14/2022) 90 tablet 3   No facility-administered medications prior to visit.    ROS: Review of Systems  Constitutional:  Positive for fatigue. Negative for activity change, appetite change, chills and unexpected weight change.  HENT:  Negative for congestion, mouth sores and sinus pressure.   Eyes:  Negative for visual disturbance.  Respiratory:  Negative for cough and chest tightness.   Cardiovascular:  Negative for leg swelling.  Gastrointestinal:  Positive for abdominal distention and abdominal pain. Negative for anal bleeding, blood in stool, nausea, rectal pain and vomiting.  Genitourinary:  Negative for difficulty urinating, frequency and vaginal pain.  Musculoskeletal:  Negative for back pain and gait problem.  Skin:  Negative for pallor and rash.  Neurological:  Negative for dizziness, weakness, numbness and headaches.  Psychiatric/Behavioral:  Positive for decreased concentration. Negative for confusion and sleep disturbance. The patient is not nervous/anxious.  Objective:  BP 136/82 (BP Location: Left Arm, Patient Position: Sitting, Cuff Size: Normal)   Pulse 76   Temp 98.7 F (37.1 C) (Oral)   Ht '6\' 2"'$  (1.88 m)   Wt 247 lb (112 kg)   SpO2 99%   BMI 31.71 kg/m   BP Readings from Last 3 Encounters:  08/03/22 136/82  06/14/22 (!) 153/89  05/12/22 (!) 181/82    Wt Readings from Last 3 Encounters:  08/03/22 247 lb (112 kg)  06/14/22 252 lb 3.2 oz (114.4 kg)  05/12/22 256 lb (116.1 kg)    Physical Exam Constitutional:      General: She is not in acute distress.    Appearance:  She is well-developed. She is obese.  HENT:     Head: Normocephalic.     Right Ear: External ear normal.     Left Ear: External ear normal.     Nose: Nose normal.  Eyes:     General:        Right eye: No discharge.        Left eye: No discharge.     Conjunctiva/sclera: Conjunctivae normal.     Pupils: Pupils are equal, round, and reactive to light.  Neck:     Thyroid: No thyromegaly.     Vascular: No JVD.     Trachea: No tracheal deviation.  Cardiovascular:     Rate and Rhythm: Normal rate and regular rhythm.     Heart sounds: Normal heart sounds.  Pulmonary:     Effort: No respiratory distress.     Breath sounds: No stridor. No wheezing.  Abdominal:     General: Bowel sounds are normal. There is no distension.     Palpations: Abdomen is soft. There is no mass.     Tenderness: There is no abdominal tenderness. There is no guarding or rebound.  Musculoskeletal:        General: No tenderness.     Cervical back: Normal range of motion and neck supple. No rigidity.  Lymphadenopathy:     Cervical: No cervical adenopathy.  Skin:    Findings: No erythema or rash.  Neurological:     Cranial Nerves: No cranial nerve deficit.     Motor: No abnormal muscle tone.     Coordination: Coordination normal.     Gait: Gait abnormal.     Deep Tendon Reflexes: Reflexes normal.  Psychiatric:        Behavior: Behavior normal.        Thought Content: Thought content normal.        Judgment: Judgment normal.     Lab Results  Component Value Date   WBC 7.7 01/28/2022   HGB 15.0 01/28/2022   HCT 45.3 01/28/2022   PLT 265.0 01/28/2022   GLUCOSE 99 01/28/2022   CHOL 169 01/28/2022   TRIG 127.0 01/28/2022   HDL 54.40 01/28/2022   LDLDIRECT 85.6 02/20/2008   LDLCALC 89 01/28/2022   ALT 14 01/28/2022   AST 18 01/28/2022   NA 140 01/28/2022   K 4.1 01/28/2022   CL 103 01/28/2022   CREATININE 0.82 01/28/2022   BUN 21 01/28/2022   CO2 31 01/28/2022   TSH 2.60 01/28/2022   INR 1.00  09/19/2018   HGBA1C 6.1 01/28/2022   MICROALBUR 0.7 04/12/2016    US Carotid Bilateral  Result Date: 06/28/2022 CLINICAL DATA:  76 year old female with a history of stroke EXAM: BILATERAL CAROTID DUPLEX ULTRASOUND TECHNIQUE: Morgan Huff scale imaging, color Doppler and duplex ultrasound were performed  of bilateral carotid and vertebral arteries in the neck. COMPARISON:  None Available. FINDINGS: Criteria: Quantification of carotid stenosis is based on velocity parameters that correlate the residual internal carotid diameter with NASCET-based stenosis levels, using the diameter of the distal internal carotid lumen as the denominator for stenosis measurement. The following velocity measurements were obtained: RIGHT ICA:  Systolic 63 cm/sec, Diastolic 9 cm/sec CCA:  92 cm/sec SYSTOLIC ICA/CCA RATIO:  0.7 ECA:  60 cm/sec LEFT ICA:  Systolic 67 cm/sec, Diastolic 23 cm/sec CCA:  83 cm/sec SYSTOLIC ICA/CCA RATIO:  0.8 ECA:  55 cm/sec Right Brachial SBP: 191 Left Brachial SBP: 190 RIGHT CAROTID ARTERY: No significant calcified disease of the right common carotid artery. Intermediate waveform maintained. Homogeneous plaque without significant calcifications at the right carotid bifurcation. Low resistance waveform of the right ICA. No significant tortuosity. RIGHT VERTEBRAL ARTERY: Antegrade flow with low resistance waveform. LEFT CAROTID ARTERY: No significant calcified disease of the left common carotid artery. Intermediate waveform maintained. Homogeneous plaque at the left carotid bifurcation without significant calcifications. Low resistance waveform of the left ICA. LEFT VERTEBRAL ARTERY:  Antegrade flow with low resistance waveform. IMPRESSION: Color duplex indicates minimal homogeneous plaque, with no hemodynamically significant stenosis by duplex criteria in the extracranial cerebrovascular circulation. Signed, Dulcy Fanny. Nadene Rubins, RPVI [facility]  Electronically Signed   By: Corrie Mckusick D.O.   On: 06/28/2022 16:37    Assessment & Plan:   Problem List Items Addressed This Visit     Hyperlipidemia    Off statin       Relevant Medications   pravastatin (PRAVACHOL) 10 MG tablet   Constipation - Primary    Chronic abdominal cramps - periodic.  Worse. (Pt saw Dr Watt Climes, had an MRI). Robinul prn        Relevant Orders   CBC with Differential/Platelet   Comprehensive metabolic panel   TSH   T4, free   CAD (coronary artery disease)    CT coronary calcium score of 302. Pravastatin to start      Relevant Medications   pravastatin (PRAVACHOL) 10 MG tablet   Allergic rhinitis    Chronic runny nose - Atrovent did not work Start Loratidine 10 mg/d      Abdominal cramps    Chronic abdominal cramps - periodic. Worse. Treat constipation. (Pt saw Dr Watt Climes, had an MRI).      Relevant Orders   CBC with Differential/Platelet   Comprehensive metabolic panel   TSH      Meds ordered this encounter  Medications   loratadine (CLARITIN) 10 MG tablet    Sig: Take 1 tablet (10 mg total) by mouth daily.    Dispense:  100 tablet    Refill:  3   polyethylene glycol powder (GLYCOLAX/MIRALAX) 17 GM/SCOOP powder    Sig: Take 17 g by mouth daily.    Dispense:  500 g    Refill:  5   senna (SENOKOT) 8.6 MG tablet    Sig: Take 1-2 tablets (8.6-17.2 mg total) by mouth daily as needed for constipation.    Dispense:  100 tablet    Refill:  3   glycopyrrolate (ROBINUL) 2 MG tablet    Sig: Take 1 tablet (2 mg total) by mouth 3 (three) times daily as needed (abdominal cramps).    Dispense:  90 tablet    Refill:  3   pravastatin (PRAVACHOL) 10 MG tablet    Sig: Take 1 tablet (10 mg total)  by mouth daily.    Dispense:  90 tablet    Refill:  3      Follow-up: Return in about 3 months (around 11/02/2022) for a follow-up visit.  Walker Kehr, MD

## 2022-08-09 ENCOUNTER — Ambulatory Visit (INDEPENDENT_AMBULATORY_CARE_PROVIDER_SITE_OTHER): Payer: Medicare Other

## 2022-08-09 VITALS — Ht 74.0 in | Wt 247.0 lb

## 2022-08-09 DIAGNOSIS — Z0000 Encounter for general adult medical examination without abnormal findings: Secondary | ICD-10-CM

## 2022-08-09 NOTE — Patient Instructions (Addendum)
Morgan Huff , Thank you for taking time to come for your Medicare Wellness Visit. I appreciate your ongoing commitment to your health goals. Please review the following plan we discussed and let me know if I can assist you in the future.   These are the goals we discussed:  Goals      I want to be more comfortable with physical activity.     Increase my exercise in the body areas that I feel weaker and take advantage of the new gym facility at [facility]        This is a list of the screening recommended for you and due dates:  Health Maintenance  Topic Date Due   COVID-19 Vaccine (10 - 2023-24 season) 07/28/2022   Medicare Annual Wellness Visit  08/10/2023   DTaP/Tdap/Td vaccine (3 - Td or Tdap) 06/25/2029   Pneumonia Vaccine  Completed   Flu Shot  Completed   DEXA scan (bone density measurement)  Completed   Hepatitis C Screening: USPSTF Recommendation to screen - Ages 35-79 yo.  Completed   Zoster (Shingles) Vaccine  Completed   HPV Vaccine  Aged Out   Colon Cancer Screening  Discontinued   Immunization History  Administered Date(s) Administered   Fluad Quad(high Dose 65+) 03/26/2019, 04/23/2020, 04/29/2021, 05/03/2022   Influenza Whole 05/02/2012, 05/02/2013   Influenza, High Dose Seasonal PF 04/11/2014, 04/12/2016, 04/13/2017   Influenza-Unspecified 04/27/2015, 05/12/2018   Moderna SARS-COV2 Booster Vaccination 06/17/2020, 12/18/2020   Moderna Sars-Covid-2 Vaccination 08/14/2019, 09/12/2019   Pneumococcal Conjugate-13 04/11/2015   Pneumococcal Polysaccharide-23 03/17/2011   Td 02/20/2008   Tdap 06/26/2019   Unspecified SARS-COV-2 Vaccination 08/14/2019, 09/12/2019, 06/17/2020, 12/18/2020, 06/02/2022   Zoster Recombinat (Shingrix) 09/08/2017, 11/24/2017   Zoster, Live 08/02/2010    Advanced directives: Yes  Conditions/risks identified: Yes  Next appointment: Follow up in one year for your annual wellness visit.   Preventive Care 73 Years and Older,  Female Preventive care refers to lifestyle choices and visits with your health care provider that can promote health and wellness. What does preventive care include? A yearly physical exam. This is also called an annual well check. Dental exams once or twice a year. Routine eye exams. Ask your health care provider how often you should have your eyes checked. Personal lifestyle choices, including: Daily care of your teeth and gums. Regular physical activity. Eating a healthy diet. Avoiding tobacco and drug use. Limiting alcohol use. Practicing safe sex. Taking low-dose aspirin every day. Taking vitamin and mineral supplements as recommended by your health care provider. What happens during an annual well check? The services and screenings done by your health care provider during your annual well check will depend on your age, overall health, lifestyle risk factors, and family history of disease. Counseling  Your health care provider may ask you questions about your: Alcohol use. Tobacco use. Drug use. Emotional well-being. Home and relationship well-being. Sexual activity. Eating habits. History of falls. Memory and ability to understand (cognition). Work and work Statistician. Reproductive health. Screening  You may have the following tests or measurements: Height, weight, and BMI. Blood pressure. Lipid and cholesterol levels. These may be checked every 5 years, or more frequently if you are over 53 years old. Skin check. Lung cancer screening. You may have this screening every year starting at age 50 if you have a 30-pack-year history of smoking and currently smoke or have quit within the past 15 years. Fecal occult blood test (FOBT) of the stool. You may have this  test every year starting at age 37. Flexible sigmoidoscopy or colonoscopy. You may have a sigmoidoscopy every 5 years or a colonoscopy every 10 years starting at age 69. Hepatitis C blood test. Hepatitis B blood  test. Sexually transmitted disease (STD) testing. Diabetes screening. This is done by checking your blood sugar (glucose) after you have not eaten for a while (fasting). You may have this done every 1-3 years. Bone density scan. This is done to screen for osteoporosis. You may have this done starting at age 66. Mammogram. This may be done every 1-2 years. Talk to your health care provider about how often you should have regular mammograms. Talk with your health care provider about your test results, treatment options, and if necessary, the need for more tests. Vaccines  Your health care provider may recommend certain vaccines, such as: Influenza vaccine. This is recommended every year. Tetanus, diphtheria, and acellular pertussis (Tdap, Td) vaccine. You may need a Td booster every 10 years. Zoster vaccine. You may need this after age 20. Pneumococcal 13-valent conjugate (PCV13) vaccine. One dose is recommended after age 54. Pneumococcal polysaccharide (PPSV23) vaccine. One dose is recommended after age 38. Talk to your health care provider about which screenings and vaccines you need and how often you need them. This information is not intended to replace advice given to you by your health care provider. Make sure you discuss any questions you have with your health care provider. Document Released: 08/15/2015 Document Revised: 04/07/2016 Document Reviewed: 05/20/2015 Elsevier Interactive Patient Education  2017 [place] Prevention in the Home Falls can cause injuries. They can happen to people of all ages. There are many things you can do to make your home safe and to help prevent falls. What can I do on the outside of my home? Regularly fix the edges of walkways and driveways and fix any cracks. Remove anything that might make you trip as you walk through a door, such as a raised step or threshold. Trim any bushes or trees on the path to your home. Use bright outdoor  lighting. Clear any walking paths of anything that might make someone trip, such as rocks or tools. Regularly check to see if handrails are loose or broken. Make sure that both sides of any steps have handrails. Any raised decks and porches should have guardrails on the edges. Have any leaves, snow, or ice cleared regularly. Use sand or salt on walking paths during winter. Clean up any spills in your garage right away. This includes oil or grease spills. What can I do in the bathroom? Use night lights. Install grab bars by the toilet and in the tub and shower. Do not use towel bars as grab bars. Use non-skid mats or decals in the tub or shower. If you need to sit down in the shower, use a plastic, non-slip stool. Keep the floor dry. Clean up any water that spills on the floor as soon as it happens. Remove soap buildup in the tub or shower regularly. Attach bath mats securely with double-sided non-slip rug tape. Do not have throw rugs and other things on the floor that can make you trip. What can I do in the bedroom? Use night lights. Make sure that you have a light by your bed that is easy to reach. Do not use any sheets or blankets that are too big for your bed. They should not hang down onto the floor. Have a firm chair that has side arms. You can  use this for support while you get dressed. Do not have throw rugs and other things on the floor that can make you trip. What can I do in the kitchen? Clean up any spills right away. Avoid walking on wet floors. Keep items that you use a lot in easy-to-reach places. If you need to reach something above you, use a strong step stool that has a grab bar. Keep electrical cords out of the way. Do not use floor polish or wax that makes floors slippery. If you must use wax, use non-skid floor wax. Do not have throw rugs and other things on the floor that can make you trip. What can I do with my stairs? Do not leave any items on the stairs. Make  sure that there are handrails on both sides of the stairs and use them. Fix handrails that are broken or loose. Make sure that handrails are as long as the stairways. Check any carpeting to make sure that it is firmly attached to the stairs. Fix any carpet that is loose or worn. Avoid having throw rugs at the top or bottom of the stairs. If you do have throw rugs, attach them to the floor with carpet tape. Make sure that you have a light switch at the top of the stairs and the bottom of the stairs. If you do not have them, ask someone to add them for you. What else can I do to help prevent falls? Wear shoes that: Do not have high heels. Have rubber bottoms. Are comfortable and fit you well. Are closed at the toe. Do not wear sandals. If you use a stepladder: Make sure that it is fully opened. Do not climb a closed stepladder. Make sure that both sides of the stepladder are locked into place. Ask someone to hold it for you, if possible. Clearly mark and make sure that you can see: Any grab bars or handrails. First and last steps. Where the edge of each step is. Use tools that help you move around (mobility aids) if they are needed. These include: Canes. Walkers. Scooters. Crutches. Turn on the lights when you go into a dark area. Replace any light bulbs as soon as they burn out. Set up your furniture so you have a clear path. Avoid moving your furniture around. If any of your floors are uneven, fix them. If there are any pets around you, be aware of where they are. Review your medicines with your doctor. Some medicines can make you feel dizzy. This can increase your chance of falling. Ask your doctor what other things that you can do to help prevent falls. This information is not intended to replace advice given to you by your health care provider. Make sure you discuss any questions you have with your health care provider. Document Released: 05/15/2009 Document Revised: 12/25/2015  Document Reviewed: 08/23/2014 Elsevier Interactive Patient Education  2017 [employer].

## 2022-08-09 NOTE — Progress Notes (Cosign Needed Addendum)
Virtual Visit via Telephone Note  I connected with  Morgan Huff on 08/09/22 at  1:00 PM EST by telephone and verified that I am speaking with the correct person using two identifiers.  Location: Patient: Home Provider: [place] Persons participating in the virtual visit: [place]   I discussed the limitations, risks, security and privacy concerns of performing an evaluation and management service by telephone and the availability of in person appointments. The patient expressed understanding and agreed to proceed.  Interactive audio and video telecommunications were attempted between this nurse and patient, however failed, due to patient having technical difficulties OR patient did not have access to video capability.  We continued and completed visit with audio only.  Some vital signs may be absent or patient reported.   Sheral Flow, LPN  Subjective:   Morgan Huff is a 77 y.o. female who presents for Medicare Annual (Subsequent) preventive examination.  Review of Systems     Cardiac Risk Factors include: advanced age (>21mn, >>62women);family history of premature cardiovascular disease;hypertension;dyslipidemia;obesity (BMI >30kg/m2)     Objective:    Today's Vitals   08/09/22 1304  Weight: 247 lb (112 kg)  Height: '6\' 2"'$  (1.88 m)  PainSc: 0-No pain   Body mass index is 31.71 kg/m.     08/09/2022    1:09 PM 06/14/2022    2:39 PM 05/12/2022    1:18 PM 08/06/2021    3:30 PM 04/23/2020    3:44 PM 09/22/2018    1:30 PM 09/22/2018    8:09 AM  Advanced Directives  Does Patient Have a Medical Advance Directive? Yes Yes Yes Yes Yes Yes Yes  Type of [place] will [place] will;Out of facility DNR (pink MOST or yellow form)   Healthcare Power of [place] will [place] will  Does patient want to make  changes to medical advance directive?    No - Patient declined  No - Patient declined   Copy of HB[place]in Chart? No - copy requested    Yes - validated most recent copy scanned in chart (See row information)  No - copy requested    Current Medications (verified) Outpatient Encounter Medications as of 08/09/2022  Medication Sig   Ascorbic Acid (VITAMIN C) 500 MG CAPS Take 500 mg by mouth daily. (Patient not taking: Reported on 06/14/2022)   aspirin 81 MG EC tablet Take 81 mg by mouth daily. Swallow whole.   Biotin 5 MG TABS Take 5,000 mg by mouth daily. Actually 5000 mcg -1 capsule daily   brimonidine (ALPHAGAN) 0.2 % ophthalmic solution SMARTSIG:In Eye(s)   brimonidine-timolol (COMBIGAN) 0.2-0.5 % ophthalmic solution Apply to eye.   calcium carbonate (TUMS - DOSED IN MG ELEMENTAL CALCIUM) 500 MG chewable tablet Chew 2 tablets by mouth daily.    cholecalciferol (VITAMIN D) 1000 units tablet Take 2 tablets (2,000 Units total) by mouth daily.   diclofenac Sodium (VOLTAREN) 1 % GEL Apply topically as needed.   glycopyrrolate (ROBINUL) 2 MG tablet Take 1 tablet (2 mg total) by mouth 3 (three) times daily as needed (abdominal cramps).   ibuprofen (ADVIL) 200 MG tablet Take 200 mg by mouth every 6 (six) hours as needed.   influenza vaccine adjuvanted (FLUAD QUADRIVALENT) 0.5 ML injection Inject into the muscle.   ipratropium (ATROVENT) 0.06 % nasal spray Place 2 sprays into the nose 3 (three) times daily. (Patient not  taking: Reported on 06/14/2022)   latanoprost (XALATAN) 0.005 % ophthalmic solution Place 1 drop into the left eye at bedtime. (Patient not taking: Reported on 08/03/2022)   loratadine (CLARITIN) 10 MG tablet Take 1 tablet (10 mg total) by mouth daily.   mirabegron ER (MYRBETRIQ) 50 MG TB24 tablet Take 1 tablet (50 mg total) by mouth daily.   Multiple Vitamins-Minerals (CENTRUM SILVER PO) Take 1 tablet by mouth daily.   Polyethyl Glycol-Propyl Glycol 0.4-0.3 % SOLN  Place 1-2 drops into both eyes 3 (three) times daily as needed (for dry eyes).    polyethylene glycol powder (GLYCOLAX/MIRALAX) 17 GM/SCOOP powder Take 17 g by mouth daily.   pravastatin (PRAVACHOL) 10 MG tablet Take 1 tablet (10 mg total) by mouth daily.   senna (SENOKOT) 8.6 MG tablet Take 1-2 tablets (8.6-17.2 mg total) by mouth daily as needed for constipation.   timolol (TIMOPTIC) 0.5 % ophthalmic solution SMARTSIG:In Eye(s)   traMADol (ULTRAM) 50 MG tablet Take by mouth every 8 (eight) hours as needed.   No facility-administered encounter medications on file as of 08/09/2022.    Allergies (verified) Donepezil, Namenda [memantine], and Latex   History: Past Medical History:  Diagnosis Date   Arthritis    Colon polyps    Diverticulosis of colon    Dysrhythmia    palpitations recently   Fasting hyperglycemia    Glaucoma    Dr Edilia Bo   Heart murmur    Hyperlipidemia    LDL goal = < 100; Lp(a) 112; hyperabsorber   Pre-diabetes    per patient   Past Surgical History:  Procedure Laterality Date   ABDOMINAL HYSTERECTOMY     ANTERIOR AND POSTERIOR REPAIR WITH SACROSPINOUS FIXATION N/A 06/05/2015   Procedure: ANTERIOR AND POSTERIOR REPAIR WITH SACROSPINOUS LIGAMENT SUSPENSION;  Surgeon: Louretta Shorten, MD;  Location: [facility];  Service: Gynecology;  Laterality: N/A;   CATARACT EXTRACTION, BILATERAL     lens implant   CHOLECYSTECTOMY     CHOLECYSTECTOMY, LAPAROSCOPIC     Dr Margot Chimes   COLONOSCOPY  2015   neg; due 2020   colonoscopy with polypectomy     Dr Watt Climes; X 3   EYE SURGERY     trabeculectomy ;Dr Kendall Flack.Cataract removal OD; Dr Scot Dock   TOTAL HIP ARTHROPLASTY Right 09/22/2018   Procedure: TOTAL HIP ARTHROPLASTY ANTERIOR APPROACH;  Surgeon: Dorna Leitz, MD;  Location: [facility];  Service: Orthopedics;  Laterality: Right;   Family History  Problem Relation Age of Onset   Polycythemia Father        transition into leukemia   Hypokalemia Father    Leukemia Father    Glaucoma  Father    Heart attack Father 40       in context hypokalemia   Lung cancer Mother        smoker   Heart attack Maternal Grandfather 46   Breast cancer Maternal Grandmother    Melanoma Brother    Diabetes Neg Hx    Stroke Neg Hx    Social History   Socioeconomic History   Marital status: Single    Spouse name: Not on file   Number of children: 0   Years of education: 16   Highest education level: Not on file  Occupational History   Occupation: Mudlogger of shows, semi-retired  Tobacco Use   Smoking status: Never   Smokeless tobacco: Never  Vaping Use   Vaping Use: Never used  Substance and Sexual Activity   Alcohol use: Not Currently  Comment:  rarely   Drug use: No   Sexual activity: Not Currently  Other Topics Concern   Not on file  Social History Narrative   Right handed   Drinks caffeine prn    One story home [place] Strain: Low Risk  (08/09/2022)   Overall Financial Resource Strain (CARDIA)    Difficulty of Paying Living Expenses: Not hard at all  Food Insecurity: No Food Insecurity (08/09/2022)   Hunger Vital Sign    Worried About Running Out of Food in the Last Year: Never true    Ran Out of Food in the Last Year: Never true  Transportation Needs: No Transportation Needs (08/09/2022)   PRAPARE - Hydrologist (Medical): No    Lack of Transportation (Non-Medical): No  Physical Activity: Sufficiently Active (08/09/2022)   Exercise Vital Sign    Days of Exercise per Week: 5 days    Minutes of Exercise per Session: 30 min  Stress: No Stress Concern Present (08/09/2022)   [place]    Feeling of Stress : Not at all  Social Connections: Moderately Integrated (08/09/2022)   Social Connection and Isolation Panel [NHANES]    Frequency of Communication with Friends and Family: More than three times a week    Frequency  of Social Gatherings with Friends and Family: More than three times a week    Attends Religious Services: 1 to 4 times per year    Active Member of [employer] or Organizations: Yes    Attends Archivist Meetings: 1 to 4 times per year    Marital Status: Never married    Tobacco Counseling Counseling given: Not Answered   Clinical Intake:  Pre-visit preparation completed: Yes  Pain : No/denies pain Pain Score: 0-No pain     BMI - recorded: 31.71 Nutritional Status: BMI > 30  Obese Nutritional Risks: None Diabetes: No  How often do you need to have someone help you when you read instructions, pamphlets, or other written materials from your doctor or pharmacy?: 1 - Never What is the last grade level you completed in school?: HSG; Bachelor's Degree  Diabetic? no  Interpreter Needed?: No  Information entered by :: Lisette Abu, LPN.   Activities of Daily Living    08/09/2022    1:17 PM  In your present state of health, do you have any difficulty performing the following activities:  Hearing? 0  Vision? 0  Difficulty concentrating or making decisions? 0  Walking or climbing stairs? 0  Dressing or bathing? 0  Doing errands, shopping? 0  Preparing Food and eating ? N  Using the Toilet? N  In the past six months, have you accidently leaked urine? Y  Do you have problems with loss of bowel control? N  Managing your Medications? N  Managing your Finances? N  Housekeeping or managing your Housekeeping? N    Patient Care Team: Plotnikov, Evie Lacks, MD as PCP - General (Internal Medicine) Clarene Essex, MD as Consulting Physician (Gastroenterology) Louretta Shorten, MD as Consulting Physician (Obstetrics and Gynecology) Stefanie Libel, MD as Consulting Physician (Sports Medicine) Dorna Leitz, MD as Consulting Physician (Orthopedic Surgery) Bjorn Loser, MD as Consulting Physician (Urology) Robley Fries, MD as Consulting Physician (Urology) Elease Hashimoto (Neurology) Bond, Tracie Harrier, MD as Referring Physician (Ophthalmology)  Indicate any recent Medical Services you may have received from other  than [facility] providers in the past year (date may be approximate).     Assessment:   This is a routine wellness examination for [place].  Hearing/Vision screen Hearing Screening - Comments:: Denies hearing difficulties   Vision Screening - Comments:: Wears rx glasses - up to date with routine eye exams with Dr. Tracie Harrier Bond   Dietary issues and exercise activities discussed: Current Exercise Habits: Home exercise routine, Type of exercise: walking;treadmill;stretching;strength training/weights;exercise ball;calisthenics (exercises more during the warmer days), Time (Minutes): 30, Frequency (Times/Week): 5, Weekly Exercise (Minutes/Week): 150, Intensity: Moderate   Goals Addressed             This Visit's Progress    I want to be more comfortable with physical activity.       Increase my exercise in the body areas that I feel weaker and take advantage of the new gym facility at [facility]    08/09/2022    1:09 PM 08/03/2022    2:07 PM 08/06/2021    3:10 PM 04/29/2021    3:24 PM 04/23/2020    4:14 PM 10/22/2019    2:06 PM 04/14/2018    3:04 PM  PHQ 2/9 Scores  PHQ - 2 Score 0 0 0 0 1 0 0  PHQ- 9 Score    0       Fall Risk    08/09/2022    1:09 PM 08/03/2022    2:07 PM 06/14/2022    2:38 PM 05/12/2022    1:18 PM 08/06/2021    3:06 PM  [place] in the past year? 0 0 0 0 0  Number falls in past yr: 0 0 0 0 0  Injury with Fall? 0 0 0 0 0  Risk for fall due to : No Fall Risks No Fall Risks   No Fall Risks  Follow up Falls prevention discussed Falls evaluation completed Falls evaluation completed Falls evaluation completed Falls evaluation completed    [place]:  Any stairs in or around the home? No  If so, are there any without handrails? No  Home free of  loose throw rugs in walkways, pet beds, electrical cords, etc? Yes  Adequate lighting in your home to reduce risk of falls? Yes   ASSISTIVE DEVICES UTILIZED TO PREVENT FALLS:  Life alert? Yes  Use of a cane, walker or w/c? No  Grab bars in the bathroom? Yes  Shower chair or bench in shower? Yes  Elevated toilet seat or a handicapped toilet? Yes   TIMED UP AND GO: Phone Visit  Was the test performed? No .   Cognitive Function:    04/13/2017    3:46 PM  MMSE - Mini Mental State Exam  Orientation to time 5  Orientation to Place 5  Registration 3  Attention/ Calculation 5  Recall 3  Language- name 2 objects 2  Language- repeat 1  Language- follow 3 step command 3  Language- read & follow direction 1  Write a sentence 1  Copy design 1  Total score 30      05/13/2022   11:00 AM  Montreal Cognitive Assessment   Visuospatial/ Executive (0/5) 2  Naming (0/3) 0  Attention: Read list of digits (0/2) 2  Attention: Read list of letters (0/1) 1  Attention: Serial 7 subtraction starting at 100 (0/3) 0  Language: Repeat phrase (0/2) 2  Language : Fluency (0/1) 1  Abstraction (  0/2) 0  Delayed Recall (0/5) 2  Orientation (0/6) 6  Total 16  Adjusted Score (based on education) 16      08/09/2022    1:18 PM 04/23/2020    4:19 PM  6CIT Screen  What Year? 0 points 0 points  What month? 0 points 0 points  What time? 0 points 0 points  Count back from 20 0 points 0 points  Months in reverse 0 points 0 points  Repeat phrase 0 points 0 points  Total Score 0 points 0 points    Immunizations Immunization History  Administered Date(s) Administered   Fluad Quad(high Dose 65+) 03/26/2019, 04/23/2020, 04/29/2021, 05/03/2022   Influenza Whole 05/02/2012, 05/02/2013   Influenza, High Dose Seasonal PF 04/11/2014, 04/12/2016, 04/13/2017   Influenza-Unspecified 04/27/2015, 05/12/2018   Moderna SARS-COV2 Booster Vaccination 06/17/2020, 12/18/2020   Moderna Sars-Covid-2 Vaccination  08/14/2019, 09/12/2019   Pneumococcal Conjugate-13 04/11/2015   Pneumococcal Polysaccharide-23 03/17/2011   Td 02/20/2008   Tdap 06/26/2019   Unspecified SARS-COV-2 Vaccination 08/14/2019, 09/12/2019, 06/17/2020, 12/18/2020, 06/02/2022   Zoster Recombinat (Shingrix) 09/08/2017, 11/24/2017   Zoster, Live 08/02/2010    TDAP status: Up to date  Flu Vaccine status: Up to date  Pneumococcal vaccine status: Up to date  Covid-19 vaccine status: Completed vaccines  Qualifies for Shingles Vaccine? Yes   Zostavax completed Yes   Shingrix Completed?: Yes  Screening Tests Health Maintenance  Topic Date Due   COVID-19 Vaccine (10 - 2023-24 season) 07/28/2022   Medicare Annual Wellness (AWV)  08/10/2023   DTaP/Tdap/Td (3 - Td or Tdap) 06/25/2029   Pneumonia Vaccine 51+ Years old  Completed   INFLUENZA VACCINE  Completed   DEXA SCAN  Completed   Hepatitis C Screening  Completed   Zoster Vaccines- Shingrix  Completed   HPV VACCINES  Aged Out   COLONOSCOPY (Pts 45-38yr Insurance coverage will need to be confirmed)  Discontinued    Health Maintenance  Health Maintenance Due  Topic Date Due   COVID-19 Vaccine (10 - 2023-24 season) 07/28/2022    Colorectal cancer screening: Type of screening: Colonoscopy. Completed 02/21/2019. Repeat every 5 years  Mammogram status: Completed 04/15/2022. Repeat every year  Bone Density status: Never done  Lung Cancer Screening: (Low Dose CT Chest recommended if Age 77-80years, 30 pack-year currently smoking OR have quit w/in 15years.) does not qualify.   Lung Cancer Screening Referral: no  Additional Screening:  Hepatitis C Screening: does qualify; Completed 04/12/2016  Vision Screening: Recommended annual ophthalmology exams for early detection of glaucoma and other disorders of the eye. Is the patient up to date with their annual eye exam?  Yes  Who is the provider or what is the name of the office in which the patient attends annual eye  exams? JRondel Oh MD. If pt is not established with a provider, would they like to be referred to a provider to establish care? No .   Dental Screening: Recommended annual dental exams for proper oral hygiene  Community Resource Referral / Chronic Care Management: CRR required this visit?  No   CCM required this visit?  No      Plan:     I have personally reviewed and noted the following in the patient's chart:   Medical and social history Use of alcohol, tobacco or illicit drugs  Current medications and supplements including opioid prescriptions. Patient is not currently taking opioid prescriptions. Functional ability and status Nutritional status Physical activity Advanced directives List of other physicians Hospitalizations, surgeries, and  ER visits in previous 12 months Vitals Screenings to include cognitive, depression, and falls Referrals and appointments  In addition, I have reviewed and discussed with patient certain preventive protocols, quality metrics, and best practice recommendations. A written personalized care plan for preventive services as well as general preventive health recommendations were provided to patient.     Sheral Flow, LPN   0/01/2375   Nurse Notes: n/a    Medical screening examination/treatment/procedure(s) were performed by non-physician practitioner and as supervising physician I was immediately available for consultation/collaboration.  I agree with above. Lew Dawes, MD

## 2022-08-16 ENCOUNTER — Encounter: Payer: Self-pay | Admitting: Physician Assistant

## 2022-08-16 ENCOUNTER — Ambulatory Visit (INDEPENDENT_AMBULATORY_CARE_PROVIDER_SITE_OTHER): Payer: Medicare Other | Admitting: Physician Assistant

## 2022-08-16 VITALS — BP 147/81 | HR 65 | Resp 18 | Ht 74.0 in | Wt 252.0 lb

## 2022-08-16 DIAGNOSIS — I2510 Atherosclerotic heart disease of native coronary artery without angina pectoris: Secondary | ICD-10-CM | POA: Diagnosis not present

## 2022-08-16 DIAGNOSIS — I2583 Coronary atherosclerosis due to lipid rich plaque: Secondary | ICD-10-CM | POA: Diagnosis not present

## 2022-08-16 DIAGNOSIS — R413 Other amnesia: Secondary | ICD-10-CM | POA: Diagnosis not present

## 2022-08-16 MED ORDER — RIVASTIGMINE TARTRATE 1.5 MG PO CAPS: ORAL_CAPSULE | ORAL | 11 refills | Status: DC

## 2022-08-16 NOTE — Patient Instructions (Signed)
It was a pleasure to see you today at our office.   Recommendations:  Follow up  April 25 at 3:30 Change to rivastigmine 1.5 mg take one cap at night for 10 days, then increase to 1.5 mg  twice a day  Recommend good control of cardiovascular risk factors.   Neuropsych evaluation    Whom to call:  Memory  decline, memory medications: Call our office [phone]   For psychiatric meds, mood meds: Please have your primary care physician manage these medications.   For assessment of decision of mental capacity and competency:  Call Dr. Anthoney Harada, geriatric psychiatrist at [phone]  For guidance in geriatric dementia issues please call [facility] [phone]   If you have any severe symptoms of a stroke, or other severe issues such as confusion,severe chills or fever, etc call 911 or go to the ER as you may need to be evaluated further       RECOMMENDATIONS FOR ALL PATIENTS WITH MEMORY PROBLEMS: 1. Continue to exercise (Recommend 30 minutes of walking everyday, or 3 hours every week) 2. Increase social interactions - continue going to [place] and enjoy social gatherings with friends and family 3. Eat healthy, avoid fried foods and eat more fruits and vegetables 4. Maintain adequate blood pressure, blood sugar, and blood cholesterol level. Reducing the risk of stroke and cardiovascular disease also helps promoting better memory. 5. Avoid stressful situations. Live a simple life and avoid aggravations. Organize your time and prepare for the next day in anticipation. 6. Sleep well, avoid any interruptions of sleep and avoid any distractions in the bedroom that may interfere with adequate sleep quality 7. Avoid sugar, avoid sweets as there is a strong link between excessive sugar intake, diabetes, and cognitive impairment We discussed the Mediterranean diet, which has been shown to help patients reduce the risk of progressive memory disorders and reduces cardiovascular  risk. This includes eating fish, eat fruits and green leafy vegetables, nuts like almonds and hazelnuts, walnuts, and also use olive oil. Avoid fast foods and fried foods as much as possible. Avoid sweets and sugar as sugar use has been linked to worsening of memory function.  There is always a concern of gradual progression of memory problems. If this is the case, then we may need to adjust level of care according to patient needs. Support, both to the patient and caregiver, should then be put into place.    FALL PRECAUTIONS: Be cautious when walking. Scan the area for obstacles that may increase the risk of trips and falls. When getting up in the mornings, sit up at the edge of the bed for a few minutes before getting out of bed. Consider elevating the bed at the head end to avoid drop of blood pressure when getting up. Walk always in a well-lit room (use night lights in the walls). Avoid area rugs or power cords from appliances in the middle of the walkways. Use a walker or a cane if necessary and consider physical therapy for balance exercise. Get your eyesight checked regularly.  FINANCIAL OVERSIGHT: Supervision, especially oversight when making financial decisions or transactions is also recommended.  HOME SAFETY: Consider the safety of the kitchen when operating appliances like stoves, microwave oven, and blender. Consider having supervision and share cooking responsibilities until no longer able to participate in those. Accidents with firearms and other hazards in the house should be identified and addressed as well.   ABILITY TO BE LEFT ALONE: If patient is unable  to contact 911 operator, consider using LifeLine, or when the need is there, arrange for someone to stay with patients. Smoking is a fire hazard, consider supervision or cessation. Risk of wandering should be assessed by caregiver and if detected at any point, supervision and safe proof recommendations should be  instituted.  MEDICATION SUPERVISION: Inability to self-administer medication needs to be constantly addressed. Implement a mechanism to ensure safe administration of the medications.   DRIVING: Regarding driving, in patients with progressive memory problems, driving will be impaired. We advise to have someone else do the driving if trouble finding directions or if minor accidents are reported. Independent driving assessment is available to determine safety of driving.   If you are interested in the driving assessment, you can contact the following:  [employer] in [place]  [place] [phone]  [place]  [facility] [phone] or [phone]

## 2022-08-16 NOTE — Progress Notes (Signed)
Assessment/Plan:   Memory Impairment of unclear etiology   Morgan Huff is a very pleasant 77 y.o. RH female with a history of hypertension, hyperlipidemia, osteoarthritis, presenting today in follow-up for evaluation of memory loss.  MRI of the brain (personally reviewed) showed small chronic lacunar infarcts within the corona radiata, bilaterally, mild chronic small vessel ischemic disease a probable posterior fossa arachnoid cyst and mild generalized cerebral atrophy, declined at this time any procedures for further investigation.  She has tried 2 different agents at this time, memantine 5 mg twice daily day and donepezil 5 mg daily with undesirable side effects.  She is interested in trying rivastigmine.  Her memory today is stable, however she continues to have issues with time, but not with place.   Recommendations:   Follow up in  3 months. Start rivastigmine 1.5 mg bid, side effects discussed ( side effects with donepezil and memantine).  If tolerated, will likely increase rivastigmine to 3 mg twice daily. Continue to control mood as per PCP Patient is scheduled for Neuropsych evaluation for clarity of diagnosis and disease progression Recommend good control of cardiovascular risk factors    Subjective:   This patient is accompanied in the office by her sister  who supplements the history. Previous records as well as any outside records available were reviewed prior to todays visit.  Patient was last seen on 06/14/2022.  Her last MoCA was 16/30 on 05/13/2022.    Any changes in memory since last visit?  She denies any memory issues, but continues to have difficulty learning names of new people or remember what she was talking about in the middle of the conversation.  Her sister is concerned about her notion of time.  The other day, she drove to the eye doctor at 2 AM instead of 2 PM appointment, she sat in the parking lot and then call her physician very early in the morning.  A  friend had given her a clock that tells day and night, but "I got rid of it I did not like it ".  She lives in independent living community, does not participate in many activities there, "I do not like to weave baskets ".  She does not enjoy crossword puzzles or word finding.  She watches some TV and is learning to use a computer that she purchased recently repeats oneself?  Endorsed Disoriented when walking into a room?  Patient denies except occasionally not remembering what patient came to the room for    Leaving objects in unusual places?  Patient denies   Wandering behavior?   denies   Any personality changes since last visit?   "She may get upset pending on how things go, may be more verbal than before, got a new computer and it is frustrating "-sister says.  Any worsening depression?: denies, she does have anxiety. Hallucinations or paranoia?  denies   Seizures?   denies    Any sleep changes?  "Sleep late, gets around 11 am " Denies  vivid dreams, REM behavior or sleepwalking   Sleep apnea?   denies   Any hygiene concerns?   denies   Independent of bathing and dressing?  Endorsed  Does the patient needs help with medications? Patient is in charge  Who is in charge of the finances? Patient  is in charge    Any changes in appetite?  denies    Patient have trouble swallowing?  denies   Does the patient cook?  No Any kitchen  accidents such as leaving the stove on?   denies   Any headaches?    denies   Vision changes? denies Chronic back pain  denies   Ambulates with difficulty?   She has hip arthritis, and at some point she has to have surgery, but she is trying to postpone it concern about anesthesia and its effects on memory. Recent falls or head injuries?    denies     Unilateral weakness, numbness or tingling?   denies   Any tremors?  denies   Any anosmia?    denies   Any incontinence of urine?  denies   Any bowel dysfunction?  denies      Patient lives independent living  community Does the patient drive?  "They are not letting me drive for the last 2 months after I was at the doctor too early "   Initial Visit 05/13/22  How long did patient have memory difficulties?  "I don't until my sister was mentioning ". She reports decreased concentration.  "In the middle of a story I forget what I was talking about ". Learning names of people is harder. LTM is normal according to her. repeats oneself? Denies  Disoriented when walking into a room?  Patient denies   Leaving objects in unusual places?  Patient denies   Patient lives  [place] lives alone Ambulates  with difficulty?   Patient has a history of arthritis, "bad he ", which may limit her ability to ambulate. Recent falls?  Patient denies   Any head injuries?  Patient denies   History of seizures?   Patient denies   Wandering behavior?  Patient denies   Patient drives?  "I get a little more cautions finding a familiar road".  She reports that has been living in [place] her life, and there is so much construction, but at times it may become more difficult, but she denies being disoriented. Any mood changes ? She denies, but her sister states that "She wakes up in the middle of the night to take a shower, there is distortion with the concept of time".  Patient denies, stating if she is already, and she has to be somewhere, "why not to take a shower?  " Any depression?:  Patient denies. She has anxiety, undergoing "a lot of stress".  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 Hallucinations?  Patient denies   Paranoia?  Patient denies   Patient reports that sleeps well without vivid dreams, REM behavior or sleepwalking    History of sleep apnea?  Patient denies   Any hygiene concerns?  Patient denies   Independent of bathing and dressing?  Endorsed  Does the patient needs help with medications? Patient in charge, denies forgetting any  doses Who is in charge of the finances? Patient  is in charge   Any changes in appetite?  Patient denies   Patient have trouble swallowing? Patient denies   Does the patient cook?  Patient denies   Any kitchen accidents such as leaving the stove on? Patient denies   Any headaches?  Patient denies   Double vision? Patient denies   Any focal numbness or tingling?  Patient denies   Chronic back pain Patient denies   Unilateral weakness?  Patient denies   Any tremors?  Patient denies   Any history of anosmia?  Patient denies   Any incontinence of urine?  She has a history of  OAB on Myrbetriq. Uses a pad "just in case Any bowel dysfunction?   History of constipation  History of heavy alcohol intake?  Patient denies   History of heavy tobacco use?  Patient denies   Family history of dementia?   Denies   Pertinent labs: TSH 2.6, A1C 6.1 nl CBC and CMP, B12 478  Carotid ultrasound 06/21/2022 with minimal homogeneous plaque, without significant stenosis.   Past Medical History:  Diagnosis Date   Arthritis    Colon polyps    Diverticulosis of colon    Dysrhythmia    palpitations recently   Fasting hyperglycemia    Glaucoma    Dr Edilia Bo   Heart murmur    Hyperlipidemia    LDL goal = < 100; Lp(a) 112; hyperabsorber   Pre-diabetes    per patient     Past Surgical History:  Procedure Laterality Date   ABDOMINAL HYSTERECTOMY     ANTERIOR AND POSTERIOR REPAIR WITH SACROSPINOUS FIXATION N/A 06/05/2015   Procedure: ANTERIOR AND POSTERIOR REPAIR WITH SACROSPINOUS LIGAMENT SUSPENSION;  Surgeon: Louretta Shorten, MD;  Location: [facility];  Service: Gynecology;  Laterality: N/A;   CATARACT EXTRACTION, BILATERAL     lens implant   CHOLECYSTECTOMY     CHOLECYSTECTOMY, LAPAROSCOPIC     Dr Margot Chimes   COLONOSCOPY  2015   neg; due 2020   colonoscopy with polypectomy     Dr Watt Climes; X 3   EYE SURGERY     trabeculectomy ;Dr Kendall Flack.Cataract removal OD; Dr Scot Dock   TOTAL HIP ARTHROPLASTY Right 09/22/2018    Procedure: TOTAL HIP ARTHROPLASTY ANTERIOR APPROACH;  Surgeon: Dorna Leitz, MD;  Location: [facility];  Service: Orthopedics;  Laterality: Right;     PREVIOUS MEDICATIONS: Memantine 5 mg twice daily (headaches and constipation)                                               Donepezil 5 mg daily   CURRENT MEDICATIONS:  Outpatient Encounter Medications as of 08/16/2022  Medication Sig   rivastigmine (EXELON) 1.5 MG capsule Take 1 cap at night for 10 days, then increase to 1 cap twice a day   Ascorbic Acid (VITAMIN C) 500 MG CAPS Take 500 mg by mouth daily. (Patient not taking: Reported on 06/14/2022)   aspirin 81 MG EC tablet Take 81 mg by mouth daily. Swallow whole.   Biotin 5 MG TABS Take 5,000 mg by mouth daily. Actually 5000 mcg -1 capsule daily   brimonidine (ALPHAGAN) 0.2 % ophthalmic solution SMARTSIG:In Eye(s)   brimonidine-timolol (COMBIGAN) 0.2-0.5 % ophthalmic solution Apply to eye.   calcium carbonate (TUMS - DOSED IN MG ELEMENTAL CALCIUM) 500 MG chewable tablet Chew 2 tablets by mouth daily.    cholecalciferol (VITAMIN D) 1000 units tablet Take 2 tablets (2,000 Units total) by mouth daily.   diclofenac Sodium (VOLTAREN) 1 % GEL Apply topically as needed.   glycopyrrolate (ROBINUL) 2 MG tablet Take 1 tablet (2 mg total) by mouth 3 (three) times daily as needed (abdominal cramps).   ibuprofen (ADVIL) 200 MG tablet Take 200 mg by mouth every 6 (six) hours as needed.   influenza vaccine adjuvanted (FLUAD QUADRIVALENT) 0.5 ML injection Inject into the muscle.   ipratropium (ATROVENT) 0.06 % nasal spray Place 2 sprays into the nose 3 (three) times daily. (Patient not taking: Reported on 06/14/2022)   latanoprost (XALATAN)  0.005 % ophthalmic solution Place 1 drop into the left eye at bedtime. (Patient not taking: Reported on 08/03/2022)   loratadine (CLARITIN) 10 MG tablet Take 1 tablet (10 mg total) by mouth daily.   mirabegron ER (MYRBETRIQ) 50 MG TB24 tablet Take 1 tablet (50 mg total) by  mouth daily.   Multiple Vitamins-Minerals (CENTRUM SILVER PO) Take 1 tablet by mouth daily.   Polyethyl Glycol-Propyl Glycol 0.4-0.3 % SOLN Place 1-2 drops into both eyes 3 (three) times daily as needed (for dry eyes).    polyethylene glycol powder (GLYCOLAX/MIRALAX) 17 GM/SCOOP powder Take 17 g by mouth daily.   pravastatin (PRAVACHOL) 10 MG tablet Take 1 tablet (10 mg total) by mouth daily.   senna (SENOKOT) 8.6 MG tablet Take 1-2 tablets (8.6-17.2 mg total) by mouth daily as needed for constipation.   timolol (TIMOPTIC) 0.5 % ophthalmic solution SMARTSIG:In Eye(s)   traMADol (ULTRAM) 50 MG tablet Take by mouth every 8 (eight) hours as needed.   No facility-administered encounter medications on file as of 08/16/2022.     Objective:     PHYSICAL EXAMINATION:    VITALS:   Vitals:   08/16/22 1105  BP: (!) 147/81  Pulse: 65  Resp: 18  SpO2: 97%  Weight: 252 lb (114.3 kg)  Height: '6\' 2"'$  (1.88 m)    GEN:  The patient appears stated age and is in NAD. HEENT:  Normocephalic, atraumatic.   Neurological examination:  General: NAD, well-groomed, appears stated age. Orientation: The patient is alert. Oriented to person, place and date Cranial nerves: There is good facial symmetry. The speech is fluent and clear. No aphasia or dysarthria. Fund of knowledge is appropriate. Recent and remote memory impaired.  Attention and concentration are reduced.  Able to name objects and unable to repeat phrases.  Hearing is intact to conversational tone.    Sensation: Sensation is intact to light touch throughout Motor: Strength is at least antigravity x4. Tremors: none  DTR's 2/4 in UE/LE      05/13/2022   11:00 AM  Montreal Cognitive Assessment   Visuospatial/ Executive (0/5) 2  Naming (0/3) 0  Attention: Read list of digits (0/2) 2  Attention: Read list of letters (0/1) 1  Attention: Serial 7 subtraction starting at 100 (0/3) 0  Language: Repeat phrase (0/2) 2  Language : Fluency (0/1) 1   Abstraction (0/2) 0  Delayed Recall (0/5) 2  Orientation (0/6) 6  Total 16  Adjusted Score (based on education) 16       04/13/2017    3:46 PM  MMSE - Mini Mental State Exam  Orientation to time 5  Orientation to Place 5  Registration 3  Attention/ Calculation 5  Recall 3  Language- name 2 objects 2  Language- repeat 1  Language- follow 3 step command 3  Language- read & follow direction 1  Write a sentence 1  Copy design 1  Total score 30       Movement examination: Tone: There is normal tone in the UE/LE Abnormal movements:  no tremor.  No myoclonus.  No asterixis.   Coordination:  There is no decremation with RAM's. Normal finger to nose  Gait and Station: The patient has no difficulty arising out of a deep-seated chair without the use of the hands. The patient's stride length is good.  Gait is cautious and narrow.   Thank you for allowing [place] the opportunity to participate in the care of this nice patient. Please do not hesitate to  contact us for any questions or concerns.   Total time spent on today's visit was 35 minutes dedicated to this patient today, preparing to see patient, examining the patient, ordering tests and/or medications and counseling the patient, documenting clinical information in the EHR or other health record, independently interpreting results and communicating results to the patient/family, discussing treatment and goals, answering patient's questions and coordinating care.  Cc:  Plotnikov, Evie Lacks, MD  Sharene Butters 08/16/2022 12:29 PM

## 2022-08-30 ENCOUNTER — Telehealth: Payer: Self-pay

## 2022-08-30 ENCOUNTER — Other Ambulatory Visit: Payer: Self-pay | Admitting: *Deleted

## 2022-08-30 NOTE — Telephone Encounter (Signed)
Advised to see PCP, thanked me for calling.

## 2022-08-30 NOTE — Telephone Encounter (Signed)
Morgan Huff reached out to see if there has been any cancellations for Dr.Merz but there hasn't. She said that her and Morgan Huff discussed getting something sooner than what Pamala Hurry os scheduled or if they can be referred elsewhere. Sister would also like to know if Morgan Huff would prescribe Anxiety meds for Morgan Huff.

## 2022-08-30 NOTE — Telephone Encounter (Signed)
Rec'd fax pt requesting refill on Memamtine 5 mg . Faxed back DENIED rx was d/c 06/2022 due to headaches and constipation.Marland KitchenJohny Chess

## 2022-09-29 DIAGNOSIS — L814 Other melanin hyperpigmentation: Secondary | ICD-10-CM | POA: Diagnosis not present

## 2022-09-29 DIAGNOSIS — L723 Sebaceous cyst: Secondary | ICD-10-CM | POA: Diagnosis not present

## 2022-09-29 DIAGNOSIS — L821 Other seborrheic keratosis: Secondary | ICD-10-CM | POA: Diagnosis not present

## 2022-10-18 DIAGNOSIS — H401331 Pigmentary glaucoma, bilateral, mild stage: Secondary | ICD-10-CM | POA: Diagnosis not present

## 2022-11-02 ENCOUNTER — Encounter: Payer: Self-pay | Admitting: Internal Medicine

## 2022-11-02 ENCOUNTER — Ambulatory Visit (INDEPENDENT_AMBULATORY_CARE_PROVIDER_SITE_OTHER): Payer: Medicare Other | Admitting: Internal Medicine

## 2022-11-02 VITALS — BP 139/80 | HR 78 | Temp 97.9°F | Ht 74.0 in | Wt 252.0 lb

## 2022-11-02 DIAGNOSIS — F419 Anxiety disorder, unspecified: Secondary | ICD-10-CM | POA: Diagnosis not present

## 2022-11-02 DIAGNOSIS — R1031 Right lower quadrant pain: Secondary | ICD-10-CM

## 2022-11-02 DIAGNOSIS — G459 Transient cerebral ischemic attack, unspecified: Secondary | ICD-10-CM | POA: Diagnosis not present

## 2022-11-02 DIAGNOSIS — E785 Hyperlipidemia, unspecified: Secondary | ICD-10-CM

## 2022-11-02 DIAGNOSIS — I2510 Atherosclerotic heart disease of native coronary artery without angina pectoris: Secondary | ICD-10-CM

## 2022-11-02 DIAGNOSIS — R739 Hyperglycemia, unspecified: Secondary | ICD-10-CM | POA: Diagnosis not present

## 2022-11-02 DIAGNOSIS — M1990 Unspecified osteoarthritis, unspecified site: Secondary | ICD-10-CM

## 2022-11-02 DIAGNOSIS — I2583 Coronary atherosclerosis due to lipid rich plaque: Secondary | ICD-10-CM | POA: Diagnosis not present

## 2022-11-02 DIAGNOSIS — K5904 Chronic idiopathic constipation: Secondary | ICD-10-CM | POA: Diagnosis not present

## 2022-11-02 LAB — CBC WITH DIFFERENTIAL/PLATELET
Basophils Absolute: 0.1 10*3/uL (ref 0.0–0.1)
Basophils Relative: 0.6 % (ref 0.0–3.0)
Eosinophils Absolute: 0.2 10*3/uL (ref 0.0–0.7)
Eosinophils Relative: 1.9 % (ref 0.0–5.0)
HCT: 45.4 % (ref 36.0–46.0)
Hemoglobin: 15.4 g/dL — ABNORMAL HIGH (ref 12.0–15.0)
Lymphocytes Relative: 28.3 % (ref 12.0–46.0)
Lymphs Abs: 2.4 10*3/uL (ref 0.7–4.0)
MCHC: 33.8 g/dL (ref 30.0–36.0)
MCV: 90.7 fl (ref 78.0–100.0)
Monocytes Absolute: 0.8 10*3/uL (ref 0.1–1.0)
Monocytes Relative: 9.2 % (ref 3.0–12.0)
Neutro Abs: 5.1 10*3/uL (ref 1.4–7.7)
Neutrophils Relative %: 60 % (ref 43.0–77.0)
Platelets: 300 10*3/uL (ref 150.0–400.0)
RBC: 5.01 Mil/uL (ref 3.87–5.11)
RDW: 13.6 % (ref 11.5–15.5)
WBC: 8.4 10*3/uL (ref 4.0–10.5)

## 2022-11-02 LAB — COMPREHENSIVE METABOLIC PANEL
ALT: 14 U/L (ref 0–35)
AST: 20 U/L (ref 0–37)
Albumin: 4.5 g/dL (ref 3.5–5.2)
Alkaline Phosphatase: 68 U/L (ref 39–117)
BUN: 21 mg/dL (ref 6–23)
CO2: 30 mEq/L (ref 19–32)
Calcium: 10.4 mg/dL (ref 8.4–10.5)
Chloride: 105 mEq/L (ref 96–112)
Creatinine, Ser: 0.83 mg/dL (ref 0.40–1.20)
GFR: 68.19 mL/min (ref >60.00)
Glucose, Bld: 93 mg/dL (ref 70–99)
Potassium: 4.1 mEq/L (ref 3.5–5.1)
Sodium: 140 mEq/L (ref 135–145)
Total Bilirubin: 0.7 mg/dL (ref 0.2–1.2)
Total Protein: 7.4 g/dL (ref 6.0–8.3)

## 2022-11-02 LAB — HEMOGLOBIN A1C: Hgb A1c MFr Bld: 6 % (ref 4.6–6.5)

## 2022-11-02 NOTE — Assessment & Plan Note (Signed)
Chronic - no change 

## 2022-11-02 NOTE — Assessment & Plan Note (Signed)
Cont on  Pravastatin 

## 2022-11-02 NOTE — Progress Notes (Signed)
Subjective:  Patient ID: Morgan Huff, female    DOB: 1946/06/15  Age: 77 y.o. MRN: [id number]  CC: Follow-up (3 MNTH F/U)   HPI Morgan Huff presents for OAB, h/o TIA, allergies  S/p R THR C/o L hip pain  Outpatient Medications Prior to Visit  Medication Sig Dispense Refill   aspirin 81 MG EC tablet Take 81 mg by mouth daily. Swallow whole.     Biotin 5 MG TABS Take 5,000 mg by mouth daily. Actually 5000 mcg -1 capsule daily     brimonidine (ALPHAGAN) 0.2 % ophthalmic solution SMARTSIG:In Eye(s)     brimonidine-timolol (COMBIGAN) 0.2-0.5 % ophthalmic solution Apply to eye.     calcium carbonate (TUMS - DOSED IN MG ELEMENTAL CALCIUM) 500 MG chewable tablet Chew 2 tablets by mouth daily.      cholecalciferol (VITAMIN D) 1000 units tablet Take 2 tablets (2,000 Units total) by mouth daily. 100 tablet 3   diclofenac Sodium (VOLTAREN) 1 % GEL Apply topically as needed.     glycopyrrolate (ROBINUL) 2 MG tablet Take 1 tablet (2 mg total) by mouth 3 (three) times daily as needed (abdominal cramps). 90 tablet 3   ibuprofen (ADVIL) 200 MG tablet Take 200 mg by mouth every 6 (six) hours as needed.     latanoprost (XALATAN) 0.005 % ophthalmic solution Place 1 drop into the left eye at bedtime.     loratadine (CLARITIN) 10 MG tablet Take 1 tablet (10 mg total) by mouth daily. 100 tablet 3   mirabegron ER (MYRBETRIQ) 50 MG TB24 tablet Take 1 tablet (50 mg total) by mouth daily. 30 tablet 11   Multiple Vitamins-Minerals (CENTRUM SILVER PO) Take 1 tablet by mouth daily.     Polyethyl Glycol-Propyl Glycol 0.4-0.3 % SOLN Place 1-2 drops into both eyes 3 (three) times daily as needed (for dry eyes).      polyethylene glycol powder (GLYCOLAX/MIRALAX) 17 GM/SCOOP powder Take 17 g by mouth daily. 500 g 5   pravastatin (PRAVACHOL) 10 MG tablet Take 1 tablet (10 mg total) by mouth daily. 90 tablet 3   rivastigmine (EXELON) 1.5 MG capsule Take 1 cap at night for 10 days, then increase to 1 cap twice  a day 60 capsule 11   senna (SENOKOT) 8.6 MG tablet Take 1-2 tablets (8.6-17.2 mg total) by mouth daily as needed for constipation. 100 tablet 3   timolol (TIMOPTIC) 0.5 % ophthalmic solution SMARTSIG:In Eye(s)     traMADol (ULTRAM) 50 MG tablet Take by mouth every 8 (eight) hours as needed.     influenza vaccine adjuvanted (FLUAD QUADRIVALENT) 0.5 ML injection Inject into the muscle. 0.5 mL 0   Ascorbic Acid (VITAMIN C) 500 MG CAPS Take 500 mg by mouth daily. (Patient not taking: Reported on 06/14/2022)     ipratropium (ATROVENT) 0.06 % nasal spray Place 2 sprays into the nose 3 (three) times daily. (Patient not taking: Reported on 06/14/2022) 15 mL 3   No facility-administered medications prior to visit.    ROS: Review of Systems  Constitutional:  Positive for fatigue. Negative for activity change, appetite change, chills and unexpected weight change.  HENT:  Negative for congestion, mouth sores and sinus pressure.   Eyes:  Negative for visual disturbance.  Respiratory:  Negative for cough and chest tightness.   Cardiovascular:  Negative for leg swelling.  Gastrointestinal:  Negative for abdominal pain and nausea.  Genitourinary:  Negative for difficulty urinating, frequency and vaginal pain.  Musculoskeletal:  Positive for  arthralgias, back pain and gait problem.  Skin:  Negative for pallor and rash.  Neurological:  Negative for dizziness, tremors, weakness, numbness and headaches.  Psychiatric/Behavioral:  Positive for decreased concentration. Negative for confusion, sleep disturbance and suicidal ideas. The patient is nervous/anxious.     Objective:  BP 139/80   Pulse 78   Temp 97.9 F (36.6 C) (Oral)   Ht 6\' 2"  (1.88 m)   Wt 252 lb (114.3 kg)   SpO2 98%   BMI 32.35 kg/m   BP Readings from Last 3 Encounters:  11/02/22 139/80  08/16/22 (!) 147/81  08/03/22 136/82    Wt Readings from Last 3 Encounters:  11/02/22 252 lb (114.3 kg)  08/16/22 252 lb (114.3 kg)  08/09/22  247 lb (112 kg)    Physical Exam Constitutional:      General: She is not in acute distress.    Appearance: She is well-developed. She is obese.  HENT:     Head: Normocephalic.     Right Ear: External ear normal.     Left Ear: External ear normal.     Nose: Nose normal.  Eyes:     General:        Right eye: No discharge.        Left eye: No discharge.     Conjunctiva/sclera: Conjunctivae normal.     Pupils: Pupils are equal, round, and reactive to light.  Neck:     Thyroid: No thyromegaly.     Vascular: No JVD.     Trachea: No tracheal deviation.  Cardiovascular:     Rate and Rhythm: Normal rate and regular rhythm.     Heart sounds: Normal heart sounds.  Pulmonary:     Effort: No respiratory distress.     Breath sounds: No stridor. No wheezing.  Abdominal:     General: Bowel sounds are normal. There is no distension.     Palpations: Abdomen is soft. There is no mass.     Tenderness: There is no abdominal tenderness. There is no guarding or rebound.  Musculoskeletal:        General: No tenderness.     Cervical back: Normal range of motion and neck supple. No rigidity.  Lymphadenopathy:     Cervical: No cervical adenopathy.  Skin:    Findings: No erythema or rash.  Neurological:     Mental Status: She is oriented to person, place, and time.     Cranial Nerves: No cranial nerve deficit.     Motor: No abnormal muscle tone.     Coordination: Coordination normal.     Deep Tendon Reflexes: Reflexes normal.  Psychiatric:        Behavior: Behavior normal.        Thought Content: Thought content normal.        Judgment: Judgment normal.   L hip w/pain on ROM  Lab Results  Component Value Date   WBC 7.9 08/03/2022   HGB 14.6 08/03/2022   HCT 44.0 08/03/2022   PLT 288.0 08/03/2022   GLUCOSE 94 08/03/2022   CHOL 169 01/28/2022   TRIG 127.0 01/28/2022   HDL 54.40 01/28/2022   LDLDIRECT 85.6 02/20/2008   LDLCALC 89 01/28/2022   ALT 17 08/03/2022   AST 20 08/03/2022    NA 141 08/03/2022   K 4.2 08/03/2022   CL 105 08/03/2022   CREATININE 0.79 08/03/2022   BUN 26 (H) 08/03/2022   CO2 28 08/03/2022   TSH 2.49 08/03/2022   INR 1.00  09/19/2018   HGBA1C 6.1 01/28/2022   MICROALBUR 0.7 04/12/2016    US Carotid Bilateral  Result Date: 06/28/2022 CLINICAL DATA:  77 year old female with a history of stroke EXAM: BILATERAL CAROTID DUPLEX ULTRASOUND TECHNIQUE: Pearline Cables scale imaging, color Doppler and duplex ultrasound were performed of bilateral carotid and vertebral arteries in the neck. COMPARISON:  None Available. FINDINGS: Criteria: Quantification of carotid stenosis is based on velocity parameters that correlate the residual internal carotid diameter with NASCET-based stenosis levels, using the diameter of the distal internal carotid lumen as the denominator for stenosis measurement. The following velocity measurements were obtained: RIGHT ICA:  Systolic 63 cm/sec, Diastolic 9 cm/sec CCA:  92 cm/sec SYSTOLIC ICA/CCA RATIO:  0.7 ECA:  60 cm/sec LEFT ICA:  Systolic 67 cm/sec, Diastolic 23 cm/sec CCA:  83 cm/sec SYSTOLIC ICA/CCA RATIO:  0.8 ECA:  55 cm/sec Right Brachial SBP: 191 Left Brachial SBP: 190 RIGHT CAROTID ARTERY: No significant calcified disease of the right common carotid artery. Intermediate waveform maintained. Homogeneous plaque without significant calcifications at the right carotid bifurcation. Low resistance waveform of the right ICA. No significant tortuosity. RIGHT VERTEBRAL ARTERY: Antegrade flow with low resistance waveform. LEFT CAROTID ARTERY: No significant calcified disease of the left common carotid artery. Intermediate waveform maintained. Homogeneous plaque at the left carotid bifurcation without significant calcifications. Low resistance waveform of the left ICA. LEFT VERTEBRAL ARTERY:  Antegrade flow with low resistance waveform. IMPRESSION: Color duplex indicates minimal homogeneous plaque, with no hemodynamically significant stenosis by  duplex criteria in the extracranial cerebrovascular circulation. Signed, Dulcy Fanny. Nadene Rubins, RPVI [facility] Electronically Signed   By: Corrie Mckusick D.O.   On: 06/28/2022 16:37    Assessment & Plan:   Problem List Items Addressed This Visit       Cardiovascular and Mediastinum   TIA (transient ischemic attack)    Global aphasia, confusion - no relapse Doppler (-) B Card ECHO ordered      CAD (coronary artery disease) - Primary    Cont on Pravastatin        Other   Hyperglycemia    Check A1c      Groin pain    Chronic - no change      Dyslipidemia    Cont on Pravastatin      Constipation    Use LOC         No orders of the defined types were placed in this encounter.     Follow-up: Return in about 3 months (around 02/01/2023) for a follow-up visit.  Walker Kehr, MD

## 2022-11-02 NOTE — Assessment & Plan Note (Signed)
Check A1c. 

## 2022-11-02 NOTE — Assessment & Plan Note (Signed)
Chronic. 

## 2022-11-02 NOTE — Assessment & Plan Note (Signed)
Use LOC 

## 2022-11-02 NOTE — Assessment & Plan Note (Signed)
See Ortho for the L hip pain if worse

## 2022-11-02 NOTE — Assessment & Plan Note (Signed)
Global aphasia, confusion - no relapse Doppler (-) B Card ECHO ordered

## 2022-11-03 ENCOUNTER — Encounter: Payer: Self-pay | Admitting: Psychology

## 2022-11-03 ENCOUNTER — Ambulatory Visit (INDEPENDENT_AMBULATORY_CARE_PROVIDER_SITE_OTHER): Payer: Medicare Other | Admitting: Psychology

## 2022-11-03 ENCOUNTER — Ambulatory Visit: Payer: Medicare Other

## 2022-11-03 DIAGNOSIS — R4189 Other symptoms and signs involving cognitive functions and awareness: Secondary | ICD-10-CM

## 2022-11-03 DIAGNOSIS — I6381 Other cerebral infarction due to occlusion or stenosis of small artery: Secondary | ICD-10-CM | POA: Diagnosis not present

## 2022-11-03 DIAGNOSIS — G3184 Mild cognitive impairment, so stated: Secondary | ICD-10-CM

## 2022-11-03 NOTE — Progress Notes (Addendum)
NEUROPSYCHOLOGICAL EVALUATION [place] Department of Neurology  Date of Evaluation: November 03, 2022  Reason for Referral:   Morgan Huff is a 77 y.o. mixed-handed Caucasian female referred by Morgan Butters, PA-C, to characterize her current cognitive functioning and assist with diagnostic clarity and treatment planning in the context of subjective cognitive decline.   Assessment and Plan:   Clinical Impression(s): Morgan Huff pattern of performance is suggestive of primary impairments surrounding processing speed, cognitive flexibility, verbal fluency, confrontation naming, and all aspects of verbal learning and memory. Performances were adequate relative to age-matched peers across basic attention, visuospatial abilities, and delayed aspects of visual memory. 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 As such, given evidence for cognitive dysfunction described above, she meets criteria for a Mild Neurocognitive Disorder ("mild cognitive impairment") at the present time.  The etiology of ongoing cognitive impairment is unclear at the present time. It sounds that family and her medical providers have concerns surrounding underlying Alzheimer's disease. I do feel that these concerns are reasonable and Morgan Huff should continue to be monitored over time. Across memory testing, Morgan Huff did not benefit from repeated exposure to novel information. While she retained about 80% of a previously read story, this percentage only amounted to her recollection of four items. Retention rates were 0% across a list of words and 38% across a previously drawn complex figure, suggesting some evolving evidence for rapid forgetting. Across verbal memory measures, she also performed very poorly across yes/no  recognition trials. 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 Further impairments surrounding semantic fluency and confrontation naming would also follow typical disease trajectory. The fact that she was able to demonstrate some normatively appropriate visual memory performances is encouraging. If Alzheimer's disease is present, it would remain in somewhat early stages at the present time.   I unfortunately do not have a better potential explanation for memory and other cognitive impairment at the present time. She does not display behavioral or cognitive characteristics of Lewy body disease, another more rare parkinsonian condition, or frontotemporal lobar degeneration. Her most recent brain MRI did reveal chronic lacunar infarcts in the bilateral corona radiata and generally mild small vessel disease. While there could be a slight vascular contribution to her overall presentation, current cognitive impairment is well beyond what could be contributed to these findings in isolation. While Morgan Huff highlighted that she was anxious about testing and did not sleep well the night before, this would also not create the severity of ongoing impairment. Morgan Huff was also very repetitive with these statements, making them numerous times throughout the evaluation without any apparent appreciation that they had been stated before. This also can be a concerning observation for Alzheimer's disease.   Recommendations: A repeat neuropsychological evaluation in 18 months (or sooner if functional decline is noted) is recommended to assess the trajectory of future cognitive decline should it occur. This will also aid in future efforts towards improved diagnostic clarity.  Morgan Huff has already been prescribed a medication aimed to address memory loss and concerns surrounding  Alzheimer's disease (i.e., rivastigmine/Exelon). She is encouraged to continue taking this medication as prescribed. It is important to highlight that this medication has been shown to slow  functional decline in some individuals. There is no current treatment which can stop or reverse cognitive decline when caused by a neurodegenerative illness.   Performance across neurocognitive testing is not a strong predictor of an individual's safety operating a motor vehicle. Should her family wish to pursue a formalized driving evaluation, they could reach out to the following agencies: [employer] in [place]: [phone] Driver [employer]habilitative Services: [facility]: [place]: [phone] or [phone]  Should there be progression of current deficits over time, Morgan Huff is unlikely to regain any independent living skills lost. Therefore, it is recommended that she remain as involved as possible in all aspects of household chores, finances, and medication management, with supervision to ensure adequate performance. She will likely benefit from the establishment and maintenance of a routine in order to maximize her functional abilities over time.  It will be important for Morgan Huff to have another person with her when in situations where she may need to process information, weigh the pros and cons of different options, and make decisions, in order to ensure that she fully understands and recalls all information to be considered.  If not already done, Morgan Huff and her family may want to discuss her wishes regarding durable power of attorney and medical decision making, so that she can have input into these choices. If they require legal assistance with this, long-term care resource access, or other aspects of estate planning, they could reach out to [employer] [facility] at [phone] for a free consultation. Additionally, they may wish to discuss  future plans for caretaking and seek out community options for in home/residential care should they become necessary.  Morgan Huff is encouraged to attend to lifestyle factors for brain health (e.g., regular physical exercise, good nutrition habits and consideration of the MIND-DASH diet, regular participation in cognitively-stimulating activities, and general stress management techniques), which are likely to have benefits for both emotional adjustment and cognition. Optimal control of vascular risk factors (including safe cardiovascular exercise and adherence to dietary recommendations) is encouraged. Continued participation in activities which provide mental stimulation and social interaction is also recommended.   Important information should be provided to Ms. Boudreau in written format in all instances. This information should be placed in a highly frequented and easily visible location within her home to promote recall. External strategies such as written notes in a consistently used memory journal, visual and nonverbal auditory cues such as a calendar on the refrigerator or appointments with alarm, such as on a cell phone, can also help maximize recall.  Because she shows better recall for structured information, she will likely understand and retain new information better if it is presented to her in a meaningful or well-organized manner at the outset, such as grouping items into meaningful categories or presenting information in an outlined, bulleted, or story format.  To address problems with processing speed, she may wish to consider:   -Ensuring that she is alerted when essential material or instructions are being presented   -Adjusting the speed at which new information is presented   -Allowing for more time in comprehending, processing, and responding in conversation   -Repeating and paraphrasing instructions or conversations aloud  To address problems with fluctuating attention and/or  executive dysfunction, she may wish to consider:   -Avoiding external distractions when needing to concentrate   -Limiting exposure to fast paced environments with multiple sensory demands   -Writing down complicated information and using checklists   -Attempting  and completing one task at a time (i.e., no multi-tasking)   -Verbalizing aloud each step of a task to maintain focus   -Taking frequent breaks during the completion of steps/tasks to avoid fatigue   -Reducing the amount of information considered at one time   -Scheduling more difficult activities for a time of day where she is usually most alert  Review of Records:   Ms. Luby was seen by [facility] Neurology Morgan Butters, PA-C) on 05/12/2022 for an evaluation of memory loss. At that time, Ms. Caddick largely denied her perception of memory loss or cognitive dysfunction. She noted that she was unaware of difficulties until these were pointed out by her sister and other family members. She did acknowledge that recalling names of known individuals is more challenging and that she may lose her train of thought mid-sentence. Her sister noted greater trouble with orientation, providing an example where Ms. Branum woke up in the middle of the night and drove to her doctor's office at 2:00am for a 2:00pm appointment as an example of this. Ms. Moldenhauer lives independently at [facility] and denied trouble with ADLs. Performance on a brief cognitive screening instrument (MOCA) was 16/30. Ultimately, Ms. Juncker was referred for a comprehensive neuropsychological evaluation to characterize her cognitive abilities and to assist with diagnostic clarity and treatment planning.   She was most recently seen by Ms. Wertman for follow-up on 08/16/2022. She had trialed both donepezil and memantine in the interim but had to discontinue these medications due to poor side effects. She was started on rivastigmine. Cognitive difficulties were said to be stable at  that time.   Brain MRI on 06/02/2022 revealed mild generalized cerebral atrophy, mild microvascular ischemic disease, small chronic lacunar infarcts in the bilateral corona radiata, and a probable posterior fossa arachnoid cyst measuring 1.8 x 2.6 cm.   Past Medical History:  Diagnosis Date   Abdominal cramps 08/03/2022   Chronic abdominal cramps - periodic. Worse. Treat constipation.  (Pt saw Dr Watt Climes, had an MRI).   Allergic rhinitis 08/03/2022   Chronic runny nose - Atrovent did not work  Start Loratidine 10 mg/d   Arthritis    Atrophic vaginitis 04/29/2021   9/22  Dr Corinna Capra, dr Claudia Desanctis  On estrocream vag 3/wk PV   Bilateral leg weakness 04/06/2013   Present since 2011-2012. She must push up from a chair   CAD (coronary artery disease) 04/29/2021   2021 CT coronary calcium score of 302.   Cont w/Simvastatin - d/c, ASA  Cont on Pravastatin   Constipation 08/03/2022   Chronic abdominal cramps - periodic. Worse.  (Pt saw Dr Watt Climes, had an MRI).   Diverticulosis of colon 10/02/2010   Dyslipidemia 02/20/2008   Simvastatin     NMR Lipoprofile 2005: LDL particle # 1776/ small dense 1106 on Lipitor 20 mg daily.   MGF MI in 53s; father MI @ 54.  No FH CVA  NMR 05/2011:LDL 78 (1169/542), HDL 41, TG 135. LDL goal < 100, ideally < 70.     [facility] panel: Hyperabsorber of  dietary cholesterol; Lp(a) 112; CRP 2.7     12/19 cardiac CT scan for calcium scoring offered     Cont on Pravastatin      Dysrhythmia    palpitations recently   Elevated blood pressure reading without diagnosis of hypertension 04/10/2010   Generalized anxiety disorder 07/29/2021   12/22  Discussed. Pt declined meds, declined ref to psychology, psychiatry... Try Valerian root for anxiety   Glaucoma 03/17/2011  Dr Edilia Bo, [place] [facility]   Groin pain 03/26/2019   8/20 R - ?[facility] vs other  R/o hernia  Gen surg ref vs CT discussed  06/2019 CT ok - Dr Watt Climes  MSK strain   Heart murmur    History of colonic polyps 03/25/2009       Colonoscopy  2009: Adenomatous polyps, Dr Watt Climes.   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   Pigmentary glaucoma of both eyes, mild stage 04/09/2013   Plantar fasciitis, left 06/18/2009   Polycythemia, secondary 04/12/2016   Mild 2016   Pre-diabetes    per patient   Pseudophakia 06/23/2012   TIA (transient ischemic attack) 07/08/2019   12/20 probable-global aphasia, confusion  global aphasia, confusion - no relapse  Doppler (-) B  Card ECHO ordered   Urinary urgency 10/02/2010   Chronic  Dr Corinna Capra, Dr McDiarmid d/c. Seeing Dr Claudia Desanctis  ?OAB  Trial of prn Ditropan 2022  Ditropan po  Try estrocream vag 3/wk PV  On Macrobid qd  10/22 Memory loss is worse.  Likely aggravated by daily use of Ditropan.  She can switch to as needed use.  12/22 on Estrocream and Myrbetriq  Discussed anxiety issues. Pt declined meds, declined ref to psychology, psychiatry...  Try Valerian root for anxie   Varicose veins of lower extremities 07/13/2010    Past Surgical History:  Procedure Laterality Date   ABDOMINAL HYSTERECTOMY     ANTERIOR AND POSTERIOR REPAIR WITH SACROSPINOUS FIXATION N/A 06/05/2015   Procedure: ANTERIOR AND POSTERIOR REPAIR WITH SACROSPINOUS LIGAMENT SUSPENSION;  Surgeon: Louretta Shorten, MD;  Location: [facility];  Service: Gynecology;  Laterality: N/A;   CATARACT EXTRACTION, BILATERAL     lens implant   CHOLECYSTECTOMY     CHOLECYSTECTOMY, LAPAROSCOPIC     Dr Margot Chimes   COLONOSCOPY  2015   neg; due 2020   colonoscopy with polypectomy     Dr Watt Climes; X 3   EYE SURGERY      trabeculectomy ;Dr Kendall Flack.Cataract removal OD; Dr Scot Dock   TOTAL HIP ARTHROPLASTY Right 09/22/2018   Procedure: TOTAL HIP ARTHROPLASTY ANTERIOR APPROACH;  Surgeon: Dorna Leitz, MD;  Location: [facility];  Service: Orthopedics;  Laterality: Right;    Current Outpatient Medications:    Ascorbic Acid (VITAMIN C) 500 MG CAPS, Take 500 mg by mouth daily. (Patient not taking: Reported on 06/14/2022), Disp: , Rfl:    aspirin 81 MG EC tablet, Take 81 mg by mouth daily. Swallow whole., Disp: , Rfl:    Biotin 5 MG TABS, Take 5,000 mg by mouth daily. Actually 5000 mcg -1 capsule daily, Disp: , Rfl:    brimonidine (ALPHAGAN) 0.2 % ophthalmic solution, SMARTSIG:In Eye(s), Disp: , Rfl:    brimonidine-timolol (COMBIGAN) 0.2-0.5 % ophthalmic solution, Apply to eye., Disp: , Rfl:    calcium carbonate (TUMS - DOSED IN MG ELEMENTAL CALCIUM) 500 MG chewable tablet, Chew 2 tablets by mouth daily. , Disp: , Rfl:  cholecalciferol (VITAMIN D) 1000 units tablet, Take 2 tablets (2,000 Units total) by mouth daily., Disp: 100 tablet, Rfl: 3   diclofenac Sodium (VOLTAREN) 1 % GEL, Apply topically as needed., Disp: , Rfl:    glycopyrrolate (ROBINUL) 2 MG tablet, Take 1 tablet (2 mg total) by mouth 3 (three) times daily as needed (abdominal cramps)., Disp: 90 tablet, Rfl: 3   ibuprofen (ADVIL) 200 MG tablet, Take 200 mg by mouth every 6 (six) hours as needed., Disp: , Rfl:    latanoprost (XALATAN) 0.005 % ophthalmic solution, Place 1 drop into the left eye at bedtime., Disp: , Rfl:    loratadine (CLARITIN) 10 MG tablet, Take 1 tablet (10 mg total) by mouth daily., Disp: 100 tablet, Rfl: 3   mirabegron ER (MYRBETRIQ) 50 MG TB24 tablet, Take 1 tablet (50 mg total) by mouth daily., Disp: 30 tablet, Rfl: 11   Multiple Vitamins-Minerals (CENTRUM SILVER PO), Take 1 tablet by mouth daily., Disp: , Rfl:    Polyethyl Glycol-Propyl Glycol 0.4-0.3 % SOLN, Place 1-2 drops into both eyes 3 (three) times daily as needed (for dry eyes). ,  Disp: , Rfl:    polyethylene glycol powder (GLYCOLAX/MIRALAX) 17 GM/SCOOP powder, Take 17 g by mouth daily., Disp: 500 g, Rfl: 5   pravastatin (PRAVACHOL) 10 MG tablet, Take 1 tablet (10 mg total) by mouth daily., Disp: 90 tablet, Rfl: 3   rivastigmine (EXELON) 1.5 MG capsule, Take 1 cap at night for 10 days, then increase to 1 cap twice a day, Disp: 60 capsule, Rfl: 11   senna (SENOKOT) 8.6 MG tablet, Take 1-2 tablets (8.6-17.2 mg total) by mouth daily as needed for constipation., Disp: 100 tablet, Rfl: 3   timolol (TIMOPTIC) 0.5 % ophthalmic solution, SMARTSIG:In Eye(s), Disp: , Rfl:    traMADol (ULTRAM) 50 MG tablet, Take by mouth every 8 (eight) hours as needed., Disp: , Rfl:   Clinical Interview:   The following information was obtained during a clinical interview with Ms. Lambing prior to cognitive testing.  Cognitive Symptoms: Decreased short-term memory: Endorsed. However, Ms. Deitering largely diminished ongoing concerns. She noted that she was largely unaware of memory dysfunction until her sisters expressed concern during a family get-together about a year or so prior. With direct questioning, she did acknowledge some trouble recalling names and losing her train of thought. She also alluded to the example described above where she can wake in the middle of the night and seemingly be disoriented, even showering and getting ready to go out. These examples were generally brushed off as not concerning to her. Yet, she did acknowledge this may elevate the concerns of others.  Decreased long-term memory: Denied. Decreased attention/concentration: Denied. Reduced processing speed: Denied. Difficulties with executive functions: Denied. She also denied trouble with impulsivity or any significant personality changes.  Difficulties with emotion regulation: Denied. Difficulties with receptive language: Denied. Difficulties with word finding: Denied. Decreased visuoperceptual ability:  Denied.  Difficulties completing ADLs: Denied. She lives at [place] in an independent living space. She described herself as being very organized when it comes to medication management and reported no trouble remembering to take medications as prescribed. While she stated that it may take her longer to pay bills and manage finances, she has no difficulty and has not forgotten to make any payments. She continues to drive without reported issue. She did acknowledge a single instance where she briefly got "twisted around" but was able to redirect herself without assistance.   Additional Medical History: History of  traumatic brain injury/concussion: Denied. History of stroke: Endorsed (see recent brain MRI).  History of seizure activity: Denied. History of known exposure to toxins: Denied. Symptoms of chronic pain: Endorsed. She reported ongoing left hip pain, noting that it was likely near the time where she should seriously consider a hip replacement procedure.  Experience of frequent headaches/migraines: Denied. Frequent instances of dizziness/vertigo: Denied.  Sensory changes: She wears glasses with benefit. Other sensory changes/difficulties (e.g., hearing, taste, smell) were denied.  Balance/coordination difficulties: Denied. She also denied any falls. However, upon personal observation, she did walk in a slow, hesitating manner and had difficulty arising out of her chair.  Other motor difficulties: Denied.  Sleep History: Estimated hours obtained each night: 8 hours.  Difficulties falling asleep: Denied. Difficulties staying asleep: Denied. Feels rested and refreshed upon awakening: Endorsed.  History of snoring: Denied. History of waking up gasping for air: Denied. Witnessed breath cessation while asleep: Denied.  History of vivid dreaming: Denied. Excessive movement while asleep: Denied. Instances of acting out her dreams: Denied.  Psychiatric/Behavioral Health  History: Depression: She described her current mood as "pretty good" and denied to her knowledge any previous mental health concerns or formal diagnoses. She did acknowledge ongoing frustration surrounding "everybody telling me what to do" and expressing concerns surrounding her cognitive abilities which she appears to feel are unfounded. Current or remote suicidal ideation, intent, or plan was denied.  Anxiety: Denied. However, medical records do suggest concerns surrounding generalized anxiety symptoms and that her PCP has discussed medication and treatment in the past.  Mania: Denied. Trauma History: Denied. Visual/auditory hallucinations: Denied. Delusional thoughts: Denied.  Tobacco: Denied. Alcohol: She denied current alcohol consumption as well as a history of problematic alcohol abuse or dependence.  Recreational drugs: Denied.  Family History: Problem Relation Age of Onset   Polycythemia Father        transition into leukemia   Hypokalemia Father    Leukemia Father    Glaucoma Father    Heart attack Father 16       in context hypokalemia   Lung cancer Mother        smoker   Heart attack Maternal Grandfather 50   Breast cancer Maternal Grandmother    Melanoma Brother    Diabetes Neg Hx    Stroke Neg Hx    This information was confirmed by Ms. Toribio Harbour.  Academic/Vocational History: Highest level of educational attainment: 16 years. She graduated from high school and earned a Dietitian in theater. She described herself as a good (A/B) student in academic settings. No relative weaknesses were identified.  History of developmental delay: Denied. History of grade repetition: Denied. Enrollment in special education courses: Denied. History of LD/ADHD: Denied.  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  Her last production was reportedly during the winter of 2023. She did not report any actors or colleagues expressing concern surrounding her cognitive abilities during that production.  Evaluation Results:   Behavioral Observations: Ms. Durnin was unaccompanied, arrived to her appointment on time, and was appropriately dressed and groomed. She appeared alert and oriented. Observed gait and station were slowed and she did appear mildly unstable at times. She had trouble rising out of her chair. Gross motor functioning appeared intact upon informal observation and no abnormal movements (e.g., tremors) were noted. Her affect was  generally relaxed and positive, but did range appropriately given the subject being discussed during the clinical interview. Spontaneous speech was fluent and word finding difficulties were not observed during the clinical interview. Thought processes were coherent, organized, and normal in content. Insight into her cognitive difficulties appeared poor and I do not believe that she is able to fully appreciate the extent of ongoing memory impairment, as well as other cognitive difficulties.   During testing, task instructions needed to be repeated several times across essentially every task administered. This was most likely due to a combination of some comprehension difficulties, made worse by memory impairment and trouble remembering the instructions themselves. Her affect ranged throughout the evaluation. At times, she was tearful, assumedly due to perceived poor performance. At other times she was frustrated and appeared irritated. She was very repetitive throughout testing. She made comments surrounding fatigue and how she normally does not wake this early in the day, as well as comments surrounding anger towards her family for expressing memory concerns. These statements were made every 10-15 minutes throughout the evaluation and Ms. Abele did not appear to appreciate that they  had been made previously. Sustained attention was adequate. Task engagement was generally adequate. She did exhibit some poor frustration tolerance and expressed a desire to discontinue the evaluation during memory testing ("can I go home now? This is stupid stuff."). She responded well to encouragement from the psychometrist and was able to complete this and a few other tasks. However, the evaluation did need to be abbreviated. Overall, Ms. Karimi was cooperative with the clinical interview and generally so with subsequent testing procedures.   Adequacy of Effort: The validity of neuropsychological testing is limited by the extent to which the individual being tested may be assumed to have exerted adequate effort during testing. Ms. Hojnacki expressed her intention to perform to the best of her abilities and exhibited adequate task engagement and persistence. Scores across stand-alone and embedded performance validity measures were variable. However, her sole below expectation performance is believed to be due to true and fairly significant cognitive impairment rather than poor engagement or attempts to perform poorly. As such, the results of the current evaluation are believed to be a generally valid representation of Ms. Schnelle current cognitive functioning.  Test Results: Ms. Sepulvado was largely oriented at the time of the current evaluation. When asked the reason she was here, she responded "last doctor told me to, 'you are going', not sure why").   Intellectual abilities based upon educational and vocational attainment were estimated to be in the average range. Premorbid abilities were estimated to be within the above average range based upon a single-word reading test.   Processing speed was exceptionally low to well below average. Basic attention was average. More complex attention (e.g., working memory) was unable to be assessed. Cognitive flexibility was exceptionally low to well below  average. Other aspects of executive functioning were unable to be assessed.  Receptive language abilities were unable to be assessed. As stated above, Ms. Booz required instructions to be repeated across nearly all administered tasks. This was likely due to combination of comprehension difficulties and her forgetting the previously provided instructions. Assessed expressive language (e.g., verbal fluency and confrontation naming) was exceptionally low.     Assessed visuospatial/visuoconstructional abilities were below average to average. Points were lost on her drawing of a clock due to confusion surrounding clock hand placement and her ultimately only placing a single hand. Points were lost on her copy of a  complex figure due to a few mild visual distortions.     Learning (i.e., encoding) of novel verbal information was well below average. Spontaneous delayed recall (i.e., retrieval) of previously learned information was exceptionally low to below average. Retention rates were 80% (raw score of 4) across a story learning task, 0% across a list learning task, and 38% across a figure drawing task task. Performance across recognition tasks was average across a figure drawing task but impaired across both verbal tasks, suggesting limited evidence for information consolidation.   Results of emotional screening instruments suggested that recent symptoms of generalized anxiety were in the mild range, while symptoms of depression were within normal limits. A screening instrument assessing recent sleep quality suggested the presence of minimal sleep dysfunction.  Tables of Scores:   Note: This summary of test scores accompanies the interpretive report and should not be considered in isolation without reference to the appropriate sections in the text. Descriptors are based on appropriate normative data and may be adjusted based on clinical judgment. Terms such as "Within Normal Limits" and "Outside Normal  Limits" are used when a more specific description of the test score cannot be determined.       Percentile - Normative Descriptor > 98 - Exceptionally High 91-97 - Well Above Average 75-90 - Above Average 25-74 - Average 9-24 - Below Average 2-8 - Well Below Average < 2 - Exceptionally Low       Validity:   DESCRIPTOR       Dot Counting Test: --- --- Outside Normal Limits  RBANS Effort Index: --- --- Within Normal Limits       Orientation:      Raw Score Percentile   NAB Orientation, Form 1 28/29 --- ---       Cognitive Screening:      Raw Score Percentile   SLUMS: 14/30 --- ---       RBANS, Form A: Standard Score/ Scaled Score Percentile   Total Score 59 <1 Exceptionally Low  Immediate Memory 65 1 Exceptionally Low    List Learning 4 2 Well Below Average    Story Memory 4 2 Well Below Average  Visuospatial/Constructional 81 10 Below Average    Figure Copy 7 16 Below Average    Line Orientation 13/20 10-16 Below Average  Language 51 <1 Exceptionally Low    Picture Naming 7/10 <2 Exceptionally Low    Semantic Fluency 1 <1 Exceptionally Low  Attention 88 21 Below Average    Digit Span 11 63 Average    Coding 5 5 Well Below Average  Delayed Memory 52 <1 Exceptionally Low    List Recall 0/10 <2 Exceptionally Low    List Recognition 13/20 <2 Exceptionally Low    Story Recall 5 5 Well Below Average    Story Recognition 4/12 <1 Exceptionally Low    Figure Recall 6 9 Below Average    Figure Recognition 6/8 30-52 Average        Intellectual Functioning:      Standard Score Percentile   Test of Premorbid Functioning: 113 81 Above Average       Attention/Executive Function:     Trail Making Test (TMT): Raw Score (T Score) Percentile     Part A 103 secs.,  0 errors (18) <1 Exceptionally Low    Part B Discontinued --- Impaired        D-KEFS Verbal Fluency Test: Raw Score (Scaled Score) Percentile     Letter Total Correct 13 (3) 1  Exceptionally Low    Category Total  Correct 15 (3) 1 Exceptionally Low    Category Switching Total Correct 7 (4) 2 Well Below Average    Category Switching Accuracy 6 (5) 5 Well Below Average      Total Set Loss Errors 2 (10) 50 Average      Total Repetition Errors 3 (10) 50 Average       Language:     Verbal Fluency Test: Raw Score (T Score) Percentile     Phonemic Fluency (FAS) 13 (22) <1 Exceptionally Low    Animal Fluency 7 (17) <1 Exceptionally Low        NAB Language Module, Form 1: T Score Percentile     Naming 15/31 (19) <1 Exceptionally Low       Visuospatial/Visuoconstruction:      Raw Score Percentile   Clock Drawing: 7/10 --- Within Normal Limits       Mood and Personality:      Raw Score Percentile   PROMIS Depression Questionnaire: 13 --- None to Slight  PROMIS Anxiety Questionnaire: 16 --- Mild       Additional Questionnaires:      Raw Score Percentile   PROMIS Sleep Disturbance Questionnaire: 8 --- None to Slight   Informed Consent and Coding/Compliance:   The current evaluation represents a clinical evaluation for the purposes previously outlined by the referral source and is in no way reflective of a forensic evaluation.   Ms. Sobieraj was provided with a verbal description of the nature and purpose of the present neuropsychological evaluation. Also reviewed were the foreseeable risks and/or discomforts and benefits of the procedure, limits of confidentiality, and mandatory reporting requirements of this provider. The patient was given the opportunity to ask questions and receive answers about the evaluation. Oral consent to participate was provided by the patient.   This evaluation was conducted by Christia Reading, Ph.D., ABPP-CN, board certified clinical neuropsychologist. Ms. Riviello completed a clinical interview with Dr. Melvyn Novas, billed as one unit [phone], and 120 minutes of cognitive testing and scoring, billed as one unit [phone] and three additional units [id number]. Psychometrist Cruzita Lederer, B.S.,  assisted Dr. Melvyn Novas with test administration and scoring procedures. As a separate and discrete service, Dr. Melvyn Novas spent a total of 160 minutes in interpretation and report writing billed as one unit [phone] and two units 96133.

## 2022-11-03 NOTE — Progress Notes (Signed)
   Psychometrician Note   Cognitive testing was administered to Morgan Huff by Cruzita Lederer, B.S. (psychometrist) under the supervision of Dr. Christia Reading, Ph.D., licensed psychologist on 11/03/2022. Morgan Huff did not appear overtly distressed by the testing session per behavioral observation or responses across self-report questionnaires. Rest breaks were offered.    The battery of tests administered was selected by Dr. Christia Reading, Ph.D. with consideration to Morgan Huff's current level of functioning, the nature of her symptoms, emotional and behavioral responses during interview, level of literacy, observed level of motivation/effort, and the nature of the referral question. This battery was communicated to the psychometrist. Communication between Dr. Christia Reading, Ph.D. and the psychometrist was ongoing throughout the evaluation and Dr. Christia Reading, Ph.D. was immediately accessible at all times. Dr. Christia Reading, Ph.D. provided supervision to the psychometrist on the date of this service to the extent necessary to assure the quality of all services provided.    Morgan Huff will return within approximately 1-2 weeks for an interactive feedback session with Dr. Melvyn Novas at which time her test performances, clinical impressions, and treatment recommendations will be reviewed in detail. Morgan Huff understands she can contact our office should she require our assistance before this time.  A total of 120 minutes of billable time were spent face-to-face with Morgan Huff by the psychometrist. This includes both test administration and scoring time. Billing for these services is reflected in the clinical report generated by Dr. Christia Reading, Ph.D.  This note reflects time spent with the psychometrician and does not include test scores or any clinical interpretations made by Dr. Melvyn Novas. The full report will follow in a separate note.

## 2022-11-11 ENCOUNTER — Ambulatory Visit (INDEPENDENT_AMBULATORY_CARE_PROVIDER_SITE_OTHER): Payer: Medicare Other | Admitting: Psychology

## 2022-11-11 DIAGNOSIS — G3184 Mild cognitive impairment, so stated: Secondary | ICD-10-CM

## 2022-11-11 DIAGNOSIS — I6381 Other cerebral infarction due to occlusion or stenosis of small artery: Secondary | ICD-10-CM | POA: Diagnosis not present

## 2022-11-11 NOTE — Progress Notes (Signed)
   Neuropsychology Feedback Session Morgan Huff. [facility] Department of Neurology  Reason for Referral:   Morgan Huff is a 77 y.o. mixed-handed Caucasian female referred by Marlowe Kays, PA-C, to characterize her current cognitive functioning and assist with diagnostic clarity and treatment planning in the context of subjective cognitive decline.   Feedback:   [name] completed a comprehensive neuropsychological evaluation on 11/03/2022. Please refer to that encounter for the full report and recommendations. Briefly, results suggested primary impairments surrounding processing speed, cognitive flexibility, verbal fluency, confrontation naming, and all aspects of verbal learning and memory. The etiology of ongoing cognitive impairment is unclear at the present time. It sounds that family and her medical providers have concerns surrounding underlying Alzheimer's disease. I do feel that these concerns are reasonable and [name] should continue to be monitored over time. Across memory testing, [name] did not benefit from repeated exposure to novel information. While she retained about 80% of a previously read story, this percentage only amounted to her recollection of four items. Retention rates were 0% across a list of words and 38% across a previously drawn complex figure, suggesting some evolving evidence for rapid forgetting. Across verbal memory measures, she also performed very poorly across yes/no recognition trials. 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 Further impairments surrounding semantic fluency and confrontation naming would also follow typical disease trajectory. The fact that she was able to demonstrate some normatively appropriate visual memory performances is encouraging. If Alzheimer's disease is present, it  would remain in somewhat early stages at the present time.   [name] was accompanied by her sister during the current feedback session. Content of the current session focused on the results of her neuropsychological evaluation. [name] was given the opportunity to ask questions and her questions were answered. We discussed the likely benefits of working with an outpatient psychotherapist to address various stressors and sources of distress. She did not request a referral currently and was advised to contact the office should she think about it and change her mind in the future. She was also encouraged to reach out should additional questions arise. A copy of her report was provided at the conclusion of the visit.      Greater than 31 minutes were spent preparing for, conducting, and documenting the current feedback session with [name], billed as one unit [phone].

## 2022-11-25 ENCOUNTER — Ambulatory Visit (INDEPENDENT_AMBULATORY_CARE_PROVIDER_SITE_OTHER): Payer: Medicare Other | Admitting: Physician Assistant

## 2022-11-25 ENCOUNTER — Encounter: Payer: Self-pay | Admitting: Physician Assistant

## 2022-11-25 VITALS — BP 130/80 | HR 74 | Resp 18 | Ht 74.0 in

## 2022-11-25 DIAGNOSIS — G3184 Mild cognitive impairment, so stated: Secondary | ICD-10-CM

## 2022-11-25 DIAGNOSIS — Z23 Encounter for immunization: Secondary | ICD-10-CM | POA: Diagnosis not present

## 2022-11-25 NOTE — Progress Notes (Signed)
Assessment/Plan:   Mild cognitive impairment with memory loss  Morgan Huff is a very pleasant 77 y.o. RH female with a history of hypertension, hyperlipidemia, osteoarthritis and a diagnosis of MCI with memory loss , unclear etiology presenting today in follow-up for evaluation of memory loss. MRI of the brain (personally reviewed) showed small chronic lacunar infarcts within the corona radiata, bilaterally, mild chronic small vessel ischemic disease a probable posterior fossa arachnoid cyst and mild generalized cerebral atrophy, declined at this time any procedures for further investigation  Patient is on rivastigmine 1.5 mg twice daily (she has side effects with donepezil and memantine). Memory is stable. Able to perform ADLs without difficulty.    Recommendations:   Follow up in 6  months. Recommend good control of cardiovascular risk factors Continue to control mood as per PCP Repeat neuropsychological evaluation in 18 months for clarity of the diagnosis and disease trajectory Continue rivastigmine1.5 mg twice daily    Subjective:   This patient is accompanied in the office by her sister  who supplements the history. Previous records as well as any outside records available were reviewed prior to todays visit.   Patient was last seen on 08/16/2022.  MoCA on 05/13/2022 was 16/30    Any changes in memory since last visit?  She continues to have difficulties remembering recent conversations and names of people.  Her sister is concerned about her notion of time.  She lives in independent living community but does not enjoy participating in any of the activities.  She does not like to do any crossword puzzles or word findings. She enjoys reading.  She watches some TV and using the computer. repeats oneself?  Endorsed Disoriented when walking into a room?  Patient denies  Leaving objects in unusual places?  Patient denies   Wandering behavior?   denies   Any personality changes  since last visit?  As before, she may become frustrated easily. Any worsening depression?: denies, but as before, she has some anxiety. Hallucinations or paranoia?  denies   Seizures?   denies    Any sleep changes?  She continues to sleep late, getting up around 11 AM.  Denies vivid dreams, REM behavior or sleepwalking   Sleep apnea?   denies   Any hygiene concerns?   denies   Independent of bathing and dressing?  Endorsed  Does the patient needs help with medications?  Patient is in charge   Who is in charge of the finances?  Patient  is in charge     Any changes in appetite?  denies     Patient have trouble swallowing?  denies   Does the patient cook? Yes    Any kitchen accidents such as leaving the stove on?  denies   Any headaches?   denies   Vision changes?  She has a history of pigmentary glaucoma, followed by ophthalmology. Takes eyedrops  Chronic back pain  denies   Ambulates with difficulty?   She has a history of hip arthritis, at some point she will need surgery, although she tries to postpone it in concern about anesthesia and its effects with memory.   Recent falls or head injuries? denies     Unilateral weakness, numbness or tingling?  denies   Any tremors?  denies   Any anosmia?  denies   Any incontinence of urine?  denies   Any bowel dysfunction?  denies      [place] lives  [place] [facility]  Does the patient drive?  She does not drive much, only to dinner, short distances.   Initial Visit 05/13/22  How long did patient have memory difficulties?  "I don't until my sister was mentioning ". She reports decreased concentration.  "In the middle of a story I forget what I was talking about ". Learning names of people is harder. LTM is normal according to her. repeats oneself? Denies  Disoriented when walking into a room?  Patient denies   Leaving objects in unusual places?  Patient denies   Patient lives  [facility] lives alone Ambulates  with difficulty?   Patient has a  history of arthritis, "bad he ", which may limit her ability to ambulate. Recent falls?  Patient denies   Any head injuries?  Patient denies   History of seizures?   Patient denies   Wandering behavior?  Patient denies   Patient drives?  "I get a little more cautions finding a familiar road".  She reports that has been living in [place] her life, and there is so much construction, but at times it may become more difficult, but she denies being disoriented. Any mood changes ? She denies, but her sister states that "She wakes up in the middle of the night to take a shower, there is distortion with the concept of time".  Patient denies, stating if she is already, and she has to be somewhere, "why not to take a shower?  " Any depression?:  Patient denies. She has anxiety, undergoing "a lot of stress".  She states that is she is a  [employer] and that contributes to situational stress, although she enjoys working. Hallucinations?  Patient denies   Paranoia?  Patient denies   Patient reports that sleeps well without vivid dreams, REM behavior or sleepwalking    History of sleep apnea?  Patient denies   Any hygiene concerns?  Patient denies   Independent of bathing and dressing?  Endorsed  Does the patient needs help with medications? Patient in charge, denies forgetting any doses Who is in charge of the finances? Patient  is in charge   Any changes in appetite?  Patient denies   Patient have trouble swallowing? Patient denies   Does the patient cook?  Patient denies   Any kitchen accidents such as leaving the stove on? Patient denies   Any headaches?  Patient denies   Double vision? Patient denies   Any focal numbness or tingling?  Patient denies   Chronic back pain Patient denies   Unilateral weakness?  Patient denies   Any tremors?  Patient denies   Any history of anosmia?  Patient denies   Any incontinence of urine?  She has a history of OAB on Myrbetriq. Uses a pad "just in  case Any bowel dysfunction?   History of constipation  History of heavy alcohol intake?  Patient denies   History of heavy tobacco use?  Patient denies   Family history of dementia?   Denies Pertinent labs: TSH 2.6, A1C 6.1 nl CBC and CMP, B12 478   Carotid ultrasound 06/21/2022 with minimal homogeneous plaque, without significant stenosis.   Neuropsychological evaluation 11/11/2022 Briefly, results suggested primary impairments surrounding processing speed, cognitive flexibility, verbal fluency, confrontation naming, and all aspects of verbal learning and memory. The etiology of ongoing cognitive impairment is unclear at the present time. It sounds that family and her medical providers have concerns surrounding underlying Alzheimer's disease. I do feel that these concerns are reasonable and Ms. Conkel should continue to be monitored  over time. Across memory testing, Ms. Valdivia did not benefit from repeated exposure to novel information. While she retained about 80% of a previously read story, this percentage only amounted to her recollection of four items. Retention rates were 0% across a list of words and 38% across a previously drawn complex figure, suggesting some evolving evidence for rapid forgetting. Across verbal memory measures, she also performed very poorly across yes/no recognition trials. 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 Further impairments surrounding semantic fluency and confrontation naming would also follow typical disease trajectory. The fact that she was able to demonstrate some normatively appropriate visual memory performances is encouraging. If Alzheimer's disease is present, it would remain in somewhat early stages at the present time.   Past Medical History:  Diagnosis Date   Abdominal cramps 08/03/2022   Chronic abdominal  cramps - periodic. Worse. Treat constipation.  (Pt saw Dr Ewing Schlein, had an MRI).   Allergic rhinitis 08/03/2022   Chronic runny nose - Atrovent did not work  Start Loratidine 10 mg/d   Arthritis    Atrophic vaginitis 04/29/2021   9/22  Dr Rana Snare, dr Arita Miss  On estrocream vag 3/wk PV   Bilateral leg weakness 04/06/2013   Present since 2011-2012. She must push up from a chair   CAD (coronary artery disease) 04/29/2021   2021 CT coronary calcium score of 302.   Cont w/Simvastatin - d/c, ASA  Cont on Pravastatin   Constipation 08/03/2022   Chronic abdominal cramps - periodic. Worse.  (Pt saw Dr Ewing Schlein, had an MRI).   Diverticulosis of colon 10/02/2010   Dyslipidemia 02/20/2008   Simvastatin     NMR Lipoprofile 2005: LDL particle # 1776/ small dense 1106 on Lipitor 20 mg daily.   MGF MI in 47s; father MI @ 40.  No FH CVA  NMR 05/2011:LDL 78 (1169/542), HDL 41, TG 135. LDL goal < 100, ideally < 70.     [facility] panel: Hyperabsorber of  dietary cholesterol; Lp(a) 112; CRP 2.7     12/19 cardiac CT scan for calcium scoring offered     Cont on Pravastatin      Dysrhythmia    palpitations recently   Elevated blood pressure reading without diagnosis of hypertension 04/10/2010   Generalized anxiety disorder 07/29/2021   12/22  Discussed. Pt declined meds, declined ref to psychology, psychiatry... Try Valerian root for anxiety   Glaucoma 03/17/2011   Dr Lottie Dawson, [facility]   Groin pain 03/26/2019   8/20 R - ?MSK vs other  R/o hernia  Gen surg ref vs CT discussed  06/2019 CT ok - Dr Ewing Schlein  MSK strain   Heart murmur    History of colonic polyps 03/25/2009       Colonoscopy 2009: Adenomatous polyps, Dr Ewing Schlein.   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   Pigmentary glaucoma of both eyes, mild stage 04/09/2013   Plantar fasciitis, left 06/18/2009   Polycythemia, secondary 04/12/2016   Mild 2016   Pre-diabetes    per patient   Pseudophakia 06/23/2012   TIA (transient ischemic attack) 07/08/2019   12/20 probable-global aphasia, confusion  global aphasia, confusion - no relapse  Doppler (-) B  Card ECHO ordered   Urinary urgency 10/02/2010   Chronic  Dr Rana Snare, Dr McDiarmid d/c. Seeing Dr Arita Miss  ?OAB  Trial of prn Ditropan 2022  Ditropan po  Try estrocream vag 3/wk PV  On Macrobid qd  10/22 Memory loss is worse.  Likely aggravated by daily use of Ditropan.  She can switch to as needed use.  12/22 on Estrocream and Myrbetriq  Discussed anxiety issues. Pt declined meds, declined ref to psychology, psychiatry...  Try Valerian root for anxie   Varicose veins of lower extremities 07/13/2010     Past Surgical History:  Procedure Laterality Date   ABDOMINAL HYSTERECTOMY     ANTERIOR AND POSTERIOR REPAIR WITH SACROSPINOUS FIXATION N/A 06/05/2015   Procedure: ANTERIOR AND POSTERIOR REPAIR WITH SACROSPINOUS LIGAMENT SUSPENSION;  Surgeon: Candice Camp, MD;  Location: [facility];  Service: Gynecology;  Laterality: N/A;   CATARACT EXTRACTION, BILATERAL     lens implant   CHOLECYSTECTOMY     CHOLECYSTECTOMY, LAPAROSCOPIC     Dr Jamey Ripa   COLONOSCOPY  2015   neg; due 2020   colonoscopy with polypectomy     Dr Ewing Schlein; X 3   EYE SURGERY     trabeculectomy ;Dr Lin Givens.Cataract removal OD; Dr Edilia Bo   TOTAL HIP ARTHROPLASTY Right 09/22/2018   Procedure: TOTAL HIP ARTHROPLASTY ANTERIOR APPROACH;  Surgeon: Jodi Geralds, MD;  Location: [facility];  Service: Orthopedics;  Laterality: Right;     PREVIOUS MEDICATIONS: Donepezil and memantine  CURRENT MEDICATIONS:  Outpatient  Encounter Medications as of 11/25/2022  Medication Sig   aspirin 81 MG EC tablet Take 81 mg by mouth daily. Swallow whole.   Biotin 5 MG TABS Take 5,000 mg by mouth daily. Actually 5000 mcg -1 capsule daily   brimonidine (ALPHAGAN) 0.2 % ophthalmic solution SMARTSIG:In Eye(s)   brimonidine-timolol (COMBIGAN) 0.2-0.5 % ophthalmic solution Apply to eye.   calcium carbonate (TUMS - DOSED IN MG ELEMENTAL CALCIUM) 500 MG chewable tablet Chew 2 tablets by mouth daily.    cholecalciferol (VITAMIN D) 1000 units tablet Take 2 tablets (2,000 Units total) by mouth daily.   diclofenac Sodium (VOLTAREN) 1 % GEL Apply topically as needed.   glycopyrrolate (ROBINUL) 2 MG tablet Take 1 tablet (2 mg total) by mouth 3 (three) times daily as needed (abdominal cramps).   ibuprofen (ADVIL) 200 MG tablet Take 200 mg by mouth every 6 (six) hours as needed.   latanoprost (XALATAN) 0.005 % ophthalmic solution Place 1 drop into the left eye at bedtime.   loratadine (CLARITIN) 10 MG tablet Take 1 tablet (10 mg total) by mouth daily.   mirabegron ER (MYRBETRIQ) 50 MG TB24 tablet Take 1 tablet (50 mg total) by mouth daily.   Multiple Vitamins-Minerals (CENTRUM SILVER PO) Take 1 tablet by mouth daily.   Polyethyl Glycol-Propyl Glycol 0.4-0.3 % SOLN Place 1-2 drops into both eyes 3 (three) times daily as needed (for dry eyes).    polyethylene glycol powder (GLYCOLAX/MIRALAX) 17 GM/SCOOP powder Take 17 g by mouth daily.   pravastatin (PRAVACHOL) 10 MG tablet Take 1 tablet (10 mg total) by mouth daily.   rivastigmine (EXELON) 1.5  MG capsule Take 1 cap at night for 10 days, then increase to 1 cap twice a day   senna (SENOKOT) 8.6 MG tablet Take 1-2 tablets (8.6-17.2 mg total) by mouth daily as needed for constipation.   timolol (TIMOPTIC) 0.5 % ophthalmic solution SMARTSIG:In Eye(s)   traMADol (ULTRAM) 50 MG tablet Take by mouth every 8 (eight) hours as needed.   Ascorbic Acid (VITAMIN C) 500 MG CAPS Take 500 mg by mouth daily.  (Patient not taking: Reported on 11/25/2022)   No facility-administered encounter medications on file as of 11/25/2022.     Objective:     PHYSICAL EXAMINATION:    VITALS:   Vitals:   11/25/22 1520  BP: 130/80  Pulse: 74  Resp: 18  SpO2: 98%  Height:  (1.88 m)    GEN:  The patient appears stated age and is in NAD. HEENT:  Normocephalic, atraumatic.   Neurological examination:  General: NAD, well-groomed, appears stated age. Orientation: The patient is alert. Oriented to person, place and date Cranial nerves: There is good facial symmetry.The speech is fluent and clear. No aphasia or dysarthria. Fund of knowledge is appropriate. Recent memory impaired and remote memory is normal.  Attention and concentration are normal.  Able to name objects and repeat phrases.  Hearing is intact to conversational tone . Sensation: Sensation is intact to light touch throughout Motor: Strength is at least antigravity x4. Tremors: none  DTR's 2/4 in UE/LE      05/13/2022   11:00 AM  Montreal Cognitive Assessment   Visuospatial/ Executive (0/5) 2  Naming (0/3) 0  Attention: Read list of digits (0/2) 2  Attention: Read list of letters (0/1) 1  Attention: Serial 7 subtraction starting at 100 (0/3) 0  Language: Repeat phrase (0/2) 2  Language : Fluency (0/1) 1  Abstraction (0/2) 0  Delayed Recall (0/5) 2  Orientation (0/6) 6  Total 16  Adjusted Score (based on education) 16       04/13/2017    3:46 PM  MMSE - Mini Mental State Exam  Orientation to time 5  Orientation to Place 5  Registration 3  Attention/ Calculation 5  Recall 3  Language- name 2 objects 2  Language- repeat 1  Language- follow 3 step command 3  Language- read & follow direction 1  Write a sentence 1  Copy design 1  Total score 30       Movement examination: Tone: There is normal tone in the UE/LE Abnormal movements:  no tremor.  No myoclonus.  No asterixis.   Coordination:  There is no decremation  with RAM's. Normal finger to nose  Gait and Station: The patient has no difficulty arising out of a deep-seated chair without the use of the hands. The patient's stride length is good.  Gait is cautious and narrow.   Thank you for allowing [place] the opportunity to participate in the care of this nice patient. Please do not hesitate to contact us for any questions or concerns.   Total time spent on today's visit was 24 minutes dedicated to this patient today, preparing to see patient, examining the patient, ordering tests and/or medications and counseling the patient, documenting clinical information in the EHR or other health record, independently interpreting results and communicating results to the patient/family, discussing treatment and goals, answering patient's questions and coordinating care.  Cc:  Plotnikov, Georgina Quint, MD  Marlowe Kays 11/25/2022 3:27 PM

## 2022-11-25 NOTE — Patient Instructions (Signed)
It was a pleasure to see you today at our office.   Recommendations:  Follow up 6 months   rivastigmine 1.5 mg  twice a day   Monitor driving   Whom to call:  Memory  decline, memory medications: Call our office [phone]   For psychiatric meds, mood meds: Please have your primary care physician manage these medications.   For assessment of decision of mental capacity and competency:  Call Dr. Erick Blinks, geriatric psychiatrist at [phone]  For guidance in geriatric dementia issues please call [facility] [phone]   If you have any severe symptoms of a stroke, or other severe issues such as confusion,severe chills or fever, etc call 911 or go to the ER as you may need to be evaluated further       RECOMMENDATIONS FOR ALL PATIENTS WITH MEMORY PROBLEMS: 1. Continue to exercise (Recommend 30 minutes of walking everyday, or 3 hours every week) 2. Increase social interactions - continue going to [place] and enjoy social gatherings with friends and family 3. Eat healthy, avoid fried foods and eat more fruits and vegetables 4. Maintain adequate blood pressure, blood sugar, and blood cholesterol level. Reducing the risk of stroke and cardiovascular disease also helps promoting better memory. 5. Avoid stressful situations. Live a simple life and avoid aggravations. Organize your time and prepare for the next day in anticipation. 6. Sleep well, avoid any interruptions of sleep and avoid any distractions in the bedroom that may interfere with adequate sleep quality 7. Avoid sugar, avoid sweets as there is a strong link between excessive sugar intake, diabetes, and cognitive impairment We discussed the Mediterranean diet, which has been shown to help patients reduce the risk of progressive memory disorders and reduces cardiovascular risk. This includes eating fish, eat fruits and green leafy vegetables, nuts like almonds and hazelnuts, walnuts, and also use olive oil.  Avoid fast foods and fried foods as much as possible. Avoid sweets and sugar as sugar use has been linked to worsening of memory function.  There is always a concern of gradual progression of memory problems. If this is the case, then we may need to adjust level of care according to patient needs. Support, both to the patient and caregiver, should then be put into place.    FALL PRECAUTIONS: Be cautious when walking. Scan the area for obstacles that may increase the risk of trips and falls. When getting up in the mornings, sit up at the edge of the bed for a few minutes before getting out of bed. Consider elevating the bed at the head end to avoid drop of blood pressure when getting up. Walk always in a well-lit room (use night lights in the walls). Avoid area rugs or power cords from appliances in the middle of the walkways. Use a walker or a cane if necessary and consider physical therapy for balance exercise. Get your eyesight checked regularly.  FINANCIAL OVERSIGHT: Supervision, especially oversight when making financial decisions or transactions is also recommended.  HOME SAFETY: Consider the safety of the kitchen when operating appliances like stoves, microwave oven, and blender. Consider having supervision and share cooking responsibilities until no longer able to participate in those. Accidents with firearms and other hazards in the house should be identified and addressed as well.   ABILITY TO BE LEFT ALONE: If patient is unable to contact 911 operator, consider using LifeLine, or when the need is there, arrange for someone to stay with patients. Smoking is a Air cabin crew hazard,  consider supervision or cessation. Risk of wandering should be assessed by caregiver and if detected at any point, supervision and safe proof recommendations should be instituted.  MEDICATION SUPERVISION: Inability to self-administer medication needs to be constantly addressed. Implement a mechanism to ensure safe  administration of the medications.   DRIVING: Regarding driving, in patients with progressive memory problems, driving will be impaired. We advise to have someone else do the driving if trouble finding directions or if minor accidents are reported. Independent driving assessment is available to determine safety of driving.   If you are interested in the driving assessment, you can contact the following:  [employer] in [place] [phone]  [facility] [phone]  [facility] [phone]  [facility] [phone] or [phone]

## 2022-12-01 DIAGNOSIS — R351 Nocturia: Secondary | ICD-10-CM | POA: Diagnosis not present

## 2022-12-01 DIAGNOSIS — N3941 Urge incontinence: Secondary | ICD-10-CM | POA: Diagnosis not present

## 2022-12-01 DIAGNOSIS — R350 Frequency of micturition: Secondary | ICD-10-CM | POA: Diagnosis not present

## 2023-01-12 ENCOUNTER — Telehealth: Payer: Self-pay | Admitting: Physician Assistant

## 2023-01-12 NOTE — Telephone Encounter (Signed)
Patient my chart message, will await discussion

## 2023-01-12 NOTE — Telephone Encounter (Signed)
Pt's sister called in and left a message with the access nurse. She is wanting to speak with someone about the drug donanamab. This would be a drug that might can help the pt's Alzheimer's

## 2023-02-10 ENCOUNTER — Ambulatory Visit: Payer: Medicare Other | Admitting: Internal Medicine

## 2023-02-10 ENCOUNTER — Encounter: Payer: Self-pay | Admitting: Internal Medicine

## 2023-02-10 VITALS — BP 140/84 | HR 80 | Temp 98.0°F | Ht 74.0 in | Wt 242.0 lb

## 2023-02-10 DIAGNOSIS — R109 Unspecified abdominal pain: Secondary | ICD-10-CM

## 2023-02-10 DIAGNOSIS — F411 Generalized anxiety disorder: Secondary | ICD-10-CM

## 2023-02-10 DIAGNOSIS — R739 Hyperglycemia, unspecified: Secondary | ICD-10-CM

## 2023-02-10 DIAGNOSIS — R634 Abnormal weight loss: Secondary | ICD-10-CM

## 2023-02-10 DIAGNOSIS — I6381 Other cerebral infarction due to occlusion or stenosis of small artery: Secondary | ICD-10-CM

## 2023-02-10 DIAGNOSIS — D751 Secondary polycythemia: Secondary | ICD-10-CM

## 2023-02-10 DIAGNOSIS — R413 Other amnesia: Secondary | ICD-10-CM | POA: Diagnosis not present

## 2023-02-10 NOTE — Progress Notes (Signed)
Subjective:  Patient ID: Morgan Huff, female    DOB: 05/01/1946  Age: 77 y.o. MRN: [id number]  CC: Follow-up (Pt states that she was having pain abdomen its located on both side. Troubles passing a bowel movement after drinking water pt states that have to urgently go) and Dizziness (Pt states that she notice she will be dizzy in the morning sometimes in the afternoon )   HPI Morgan Huff presents for RLQ pain - recurrent and chronic Follow-up on overactive bladder, memory loss, anxiety  Here w/sister Vernona Rieger  Outpatient Medications Prior to Visit  Medication Sig Dispense Refill   Ascorbic Acid (VITAMIN C) 500 MG CAPS Take 500 mg by mouth daily.     aspirin 81 MG EC tablet Take 81 mg by mouth daily. Swallow whole.     Biotin 5 MG TABS Take 5,000 mg by mouth daily. Actually 5000 mcg -1 capsule daily     brimonidine (ALPHAGAN) 0.2 % ophthalmic solution SMARTSIG:In Eye(s)     brimonidine-timolol (COMBIGAN) 0.2-0.5 % ophthalmic solution Apply to eye.     calcium carbonate (TUMS - DOSED IN MG ELEMENTAL CALCIUM) 500 MG chewable tablet Chew 2 tablets by mouth daily.      cholecalciferol (VITAMIN D) 1000 units tablet Take 2 tablets (2,000 Units total) by mouth daily. 100 tablet 3   diclofenac Sodium (VOLTAREN) 1 % GEL Apply topically as needed.     glycopyrrolate (ROBINUL) 2 MG tablet Take 1 tablet (2 mg total) by mouth 3 (three) times daily as needed (abdominal cramps). 90 tablet 3   ibuprofen (ADVIL) 200 MG tablet Take 200 mg by mouth every 6 (six) hours as needed.     latanoprost (XALATAN) 0.005 % ophthalmic solution Place 1 drop into the left eye at bedtime.     loratadine (CLARITIN) 10 MG tablet Take 1 tablet (10 mg total) by mouth daily. 100 tablet 3   mirabegron ER (MYRBETRIQ) 50 MG TB24 tablet Take 1 tablet (50 mg total) by mouth daily. 30 tablet 11   Multiple Vitamins-Minerals (CENTRUM SILVER PO) Take 1 tablet by mouth daily.     Polyethyl Glycol-Propyl Glycol 0.4-0.3 % SOLN  Place 1-2 drops into both eyes 3 (three) times daily as needed (for dry eyes).      polyethylene glycol powder (GLYCOLAX/MIRALAX) 17 GM/SCOOP powder Take 17 g by mouth daily. 500 g 5   pravastatin (PRAVACHOL) 10 MG tablet Take 1 tablet (10 mg total) by mouth daily. 90 tablet 3   rivastigmine (EXELON) 1.5 MG capsule Take 1 cap at night for 10 days, then increase to 1 cap twice a day 60 capsule 11   senna (SENOKOT) 8.6 MG tablet Take 1-2 tablets (8.6-17.2 mg total) by mouth daily as needed for constipation. 100 tablet 3   timolol (TIMOPTIC) 0.5 % ophthalmic solution SMARTSIG:In Eye(s)     traMADol (ULTRAM) 50 MG tablet Take by mouth every 8 (eight) hours as needed.     No facility-administered medications prior to visit.    ROS: Review of Systems  Constitutional:  Negative for activity change, appetite change, chills, fatigue and unexpected weight change.  HENT:  Negative for congestion, mouth sores and sinus pressure.   Eyes:  Negative for visual disturbance.  Respiratory:  Negative for cough and chest tightness.   Gastrointestinal:  Positive for abdominal pain. Negative for nausea.  Genitourinary:  Negative for difficulty urinating, frequency and vaginal pain.  Musculoskeletal:  Negative for back pain and gait problem.  Skin:  Negative  for pallor and rash.  Neurological:  Negative for dizziness, tremors, weakness, numbness and headaches.  Psychiatric/Behavioral:  Negative for confusion and sleep disturbance.     Objective:  BP (!) 140/84 (BP Location: Left Arm, Patient Position: Sitting, Cuff Size: Normal)   Pulse 80   Temp 98 F (36.7 C) (Oral)   Ht 6\' 2"  (1.88 m)   Wt 242 lb (109.8 kg)   SpO2 99%   BMI 31.07 kg/m   BP Readings from Last 3 Encounters:  02/10/23 (!) 140/84  11/25/22 130/80  11/02/22 139/80    Wt Readings from Last 3 Encounters:  02/10/23 242 lb (109.8 kg)  11/02/22 252 lb (114.3 kg)  08/16/22 252 lb (114.3 kg)    Physical Exam Constitutional:       General: She is not in acute distress.    Appearance: She is well-developed. She is obese.  HENT:     Head: Normocephalic.     Right Ear: External ear normal.     Left Ear: External ear normal.     Nose: Nose normal.  Eyes:     General:        Right eye: No discharge.        Left eye: No discharge.     Conjunctiva/sclera: Conjunctivae normal.     Pupils: Pupils are equal, round, and reactive to light.  Neck:     Thyroid: No thyromegaly.     Vascular: No JVD.     Trachea: No tracheal deviation.  Cardiovascular:     Rate and Rhythm: Normal rate and regular rhythm.     Heart sounds: Normal heart sounds.  Pulmonary:     Effort: No respiratory distress.     Breath sounds: No stridor. No wheezing.  Abdominal:     General: Bowel sounds are normal. There is no distension.     Palpations: Abdomen is soft. There is no mass.     Tenderness: There is no abdominal tenderness. There is no guarding or rebound.  Musculoskeletal:        General: No tenderness.     Cervical back: Normal range of motion and neck supple. No rigidity.  Lymphadenopathy:     Cervical: No cervical adenopathy.  Skin:    Findings: No erythema or rash.  Neurological:     Mental Status: Mental status is at baseline.     Cranial Nerves: No cranial nerve deficit.     Motor: No abnormal muscle tone.     Coordination: Coordination normal.     Deep Tendon Reflexes: Reflexes normal.  Psychiatric:        Behavior: Behavior normal.        Thought Content: Thought content normal.        Judgment: Judgment normal.   Abdomen is nontender.  No mass   Lab Results  Component Value Date   WBC 8.1 02/10/2023   HGB 14.2 02/10/2023   HCT 43.6 02/10/2023   PLT 311.0 02/10/2023   GLUCOSE 106 (H) 02/10/2023   CHOL 169 01/28/2022   TRIG 127.0 01/28/2022   HDL 54.40 01/28/2022   LDLDIRECT 85.6 02/20/2008   LDLCALC 89 01/28/2022   ALT 13 02/10/2023   AST 18 02/10/2023   NA 141 02/10/2023   K 4.3 02/10/2023   CL 106  02/10/2023   CREATININE 0.84 02/10/2023   BUN 31 (H) 02/10/2023   CO2 29 02/10/2023   TSH 2.13 02/10/2023   INR 1.00 09/19/2018   HGBA1C 6.0 11/02/2022   MICROALBUR  0.7 04/12/2016    US Carotid Bilateral  Result Date: 06/28/2022 CLINICAL DATA:  77 year old female with a history of stroke EXAM: BILATERAL CAROTID DUPLEX ULTRASOUND TECHNIQUE: Wallace Cullens scale imaging, color Doppler and duplex ultrasound were performed of bilateral carotid and vertebral arteries in the neck. COMPARISON:  None Available. FINDINGS: Criteria: Quantification of carotid stenosis is based on velocity parameters that correlate the residual internal carotid diameter with NASCET-based stenosis levels, using the diameter of the distal internal carotid lumen as the denominator for stenosis measurement. The following velocity measurements were obtained: RIGHT ICA:  Systolic 63 cm/sec, Diastolic 9 cm/sec CCA:  92 cm/sec SYSTOLIC ICA/CCA RATIO:  0.7 ECA:  60 cm/sec LEFT ICA:  Systolic 67 cm/sec, Diastolic 23 cm/sec CCA:  83 cm/sec SYSTOLIC ICA/CCA RATIO:  0.8 ECA:  55 cm/sec Right Brachial SBP: 191 Left Brachial SBP: 190 RIGHT CAROTID ARTERY: No significant calcified disease of the right common carotid artery. Intermediate waveform maintained. Homogeneous plaque without significant calcifications at the right carotid bifurcation. Low resistance waveform of the right ICA. No significant tortuosity. RIGHT VERTEBRAL ARTERY: Antegrade flow with low resistance waveform. LEFT CAROTID ARTERY: No significant calcified disease of the left common carotid artery. Intermediate waveform maintained. Homogeneous plaque at the left carotid bifurcation without significant calcifications. Low resistance waveform of the left ICA. LEFT VERTEBRAL ARTERY:  Antegrade flow with low resistance waveform. IMPRESSION: Color duplex indicates minimal homogeneous plaque, with no hemodynamically significant stenosis by duplex criteria in the extracranial cerebrovascular  circulation. Signed, Yvone Neu. Miachel Roux, RPVI [facility] Electronically Signed   By: Gilmer Mor D.O.   On: 06/28/2022 16:37    Assessment & Plan:   Problem List Items Addressed This Visit     Hyperglycemia - Primary   Relevant Orders   CBC with Differential/Platelet (Completed)   Comprehensive metabolic panel (Completed)   TSH (Completed)   Polycythemia, secondary   Relevant Orders   CBC with Differential/Platelet (Completed)   Comprehensive metabolic panel (Completed)   TSH (Completed)   Generalized anxiety disorder    Stable.  No change in plans      Abdominal cramps    Chronic abdominal cramps - periodic. Worse. Treat constipation. (Pt saw Dr Ewing Schlein, had an MRI)..  IBS type symptoms.  Follow-up with Dr. Ewing Schlein pending soon.      Multiple lacunar infarcts (HCC)    Continue with aspirin, statin, blood pressure control effort      Memory change    Follow-up with Durenda Age Continue with Exelon      Other Visit Diagnoses     Weight loss       Relevant Orders   CBC with Differential/Platelet (Completed)   Comprehensive metabolic panel (Completed)   TSH (Completed)         No orders of the defined types were placed in this encounter.     Follow-up: Return in about 3 months (around 05/13/2023) for a follow-up visit.  Sonda Primes, MD

## 2023-02-11 LAB — CBC WITH DIFFERENTIAL/PLATELET
Basophils Absolute: 0.1 10*3/uL (ref 0.0–0.1)
Basophils Relative: 1.6 % (ref 0.0–3.0)
Eosinophils Absolute: 0.2 10*3/uL (ref 0.0–0.7)
Eosinophils Relative: 3 % (ref 0.0–5.0)
HCT: 43.6 % (ref 36.0–46.0)
Hemoglobin: 14.2 g/dL (ref 12.0–15.0)
Lymphocytes Relative: 27.3 % (ref 12.0–46.0)
Lymphs Abs: 2.2 10*3/uL (ref 0.7–4.0)
MCHC: 32.6 g/dL (ref 30.0–36.0)
MCV: 91.7 fl (ref 78.0–100.0)
Monocytes Absolute: 0.6 10*3/uL (ref 0.1–1.0)
Monocytes Relative: 7.7 % (ref 3.0–12.0)
Neutro Abs: 4.9 10*3/uL (ref 1.4–7.7)
Neutrophils Relative %: 60.4 % (ref 43.0–77.0)
Platelets: 311 10*3/uL (ref 150.0–400.0)
RBC: 4.75 Mil/uL (ref 3.87–5.11)
RDW: 14 % (ref 11.5–15.5)
WBC: 8.1 10*3/uL (ref 4.0–10.5)

## 2023-02-11 LAB — COMPREHENSIVE METABOLIC PANEL
ALT: 13 U/L (ref 0–35)
AST: 18 U/L (ref 0–37)
Albumin: 4.2 g/dL (ref 3.5–5.2)
Alkaline Phosphatase: 55 U/L (ref 39–117)
BUN: 31 mg/dL — ABNORMAL HIGH (ref 6–23)
CO2: 29 mEq/L (ref 19–32)
Calcium: 10.2 mg/dL (ref 8.4–10.5)
Chloride: 106 mEq/L (ref 96–112)
Creatinine, Ser: 0.84 mg/dL (ref 0.40–1.20)
GFR: 67.09 mL/min (ref >60.00)
Glucose, Bld: 106 mg/dL — ABNORMAL HIGH (ref 70–99)
Potassium: 4.3 mEq/L (ref 3.5–5.1)
Sodium: 141 mEq/L (ref 135–145)
Total Bilirubin: 0.7 mg/dL (ref 0.2–1.2)
Total Protein: 7.1 g/dL (ref 6.0–8.3)

## 2023-02-11 LAB — TSH: TSH: 2.13 u[IU]/mL (ref 0.35–5.50)

## 2023-02-14 ENCOUNTER — Encounter: Payer: Self-pay | Admitting: Internal Medicine

## 2023-02-14 DIAGNOSIS — R413 Other amnesia: Secondary | ICD-10-CM | POA: Insufficient documentation

## 2023-02-14 NOTE — Assessment & Plan Note (Signed)
Chronic abdominal cramps - periodic. Worse. Treat constipation. (Pt saw Dr Ewing Schlein, had an MRI)..  IBS type symptoms.  Follow-up with Dr. Ewing Schlein pending soon.

## 2023-02-14 NOTE — Assessment & Plan Note (Signed)
Continue with aspirin, statin, blood pressure control effort

## 2023-02-14 NOTE — Assessment & Plan Note (Signed)
Stable ?No change in plans ?

## 2023-02-14 NOTE — Assessment & Plan Note (Signed)
Follow-up with Morgan Huff Continue with Exelon

## 2023-02-24 DIAGNOSIS — H401331 Pigmentary glaucoma, bilateral, mild stage: Secondary | ICD-10-CM | POA: Diagnosis not present

## 2023-03-30 ENCOUNTER — Encounter: Payer: Medicare Other | Admitting: Psychology

## 2023-04-07 ENCOUNTER — Encounter: Payer: Medicare Other | Admitting: Psychology

## 2023-04-08 ENCOUNTER — Encounter: Payer: Self-pay | Admitting: Physician Assistant

## 2023-04-13 DIAGNOSIS — Z1231 Encounter for screening mammogram for malignant neoplasm of breast: Secondary | ICD-10-CM | POA: Diagnosis not present

## 2023-04-13 LAB — HM MAMMOGRAPHY

## 2023-04-15 ENCOUNTER — Encounter: Payer: Self-pay | Admitting: Internal Medicine

## 2023-04-18 DIAGNOSIS — R1031 Right lower quadrant pain: Secondary | ICD-10-CM | POA: Diagnosis not present

## 2023-04-18 DIAGNOSIS — Z86010 Personal history of colonic polyps: Secondary | ICD-10-CM | POA: Diagnosis not present

## 2023-05-05 DIAGNOSIS — Z23 Encounter for immunization: Secondary | ICD-10-CM | POA: Diagnosis not present

## 2023-05-16 ENCOUNTER — Encounter: Payer: Self-pay | Admitting: Internal Medicine

## 2023-05-16 ENCOUNTER — Ambulatory Visit: Payer: Medicare Other | Admitting: Internal Medicine

## 2023-05-16 VITALS — BP 118/80 | HR 77 | Temp 98.1°F | Ht 74.0 in | Wt 234.0 lb

## 2023-05-16 DIAGNOSIS — I2510 Atherosclerotic heart disease of native coronary artery without angina pectoris: Secondary | ICD-10-CM | POA: Diagnosis not present

## 2023-05-16 DIAGNOSIS — R3915 Urgency of urination: Secondary | ICD-10-CM | POA: Diagnosis not present

## 2023-05-16 DIAGNOSIS — F411 Generalized anxiety disorder: Secondary | ICD-10-CM

## 2023-05-16 DIAGNOSIS — R739 Hyperglycemia, unspecified: Secondary | ICD-10-CM

## 2023-05-16 DIAGNOSIS — L304 Erythema intertrigo: Secondary | ICD-10-CM | POA: Insufficient documentation

## 2023-05-16 DIAGNOSIS — I2583 Coronary atherosclerosis due to lipid rich plaque: Secondary | ICD-10-CM

## 2023-05-16 DIAGNOSIS — E783 Hyperchylomicronemia: Secondary | ICD-10-CM

## 2023-05-16 DIAGNOSIS — R413 Other amnesia: Secondary | ICD-10-CM | POA: Diagnosis not present

## 2023-05-16 LAB — COMPREHENSIVE METABOLIC PANEL
ALT: 14 U/L (ref 0–35)
AST: 16 U/L (ref 0–37)
Albumin: 4.1 g/dL (ref 3.5–5.2)
Alkaline Phosphatase: 50 U/L (ref 39–117)
BUN: 28 mg/dL — ABNORMAL HIGH (ref 6–23)
CO2: 29 meq/L (ref 19–32)
Calcium: 10.1 mg/dL (ref 8.4–10.5)
Chloride: 106 meq/L (ref 96–112)
Creatinine, Ser: 0.8 mg/dL (ref 0.40–1.20)
GFR: 71 mL/min (ref >60.00)
Glucose, Bld: 100 mg/dL — ABNORMAL HIGH (ref 70–99)
Potassium: 4.1 meq/L (ref 3.5–5.1)
Sodium: 142 meq/L (ref 135–145)
Total Bilirubin: 0.8 mg/dL (ref 0.2–1.2)
Total Protein: 6.9 g/dL (ref 6.0–8.3)

## 2023-05-16 LAB — HEMOGLOBIN A1C: Hgb A1c MFr Bld: 5.8 % (ref 4.6–6.5)

## 2023-05-16 MED ORDER — KETOCONAZOLE 2 % EX CREA: 1.0000 | TOPICAL_CREAM | Freq: Every day | CUTANEOUS | 1 refills | Status: DC

## 2023-05-16 MED ORDER — KETOCONAZOLE 200 MG PO TABS: 200.0000 mg | ORAL_TABLET | Freq: Two times a day (BID) | ORAL | 0 refills | Status: DC

## 2023-05-16 NOTE — Assessment & Plan Note (Signed)
Start on Ketoconazole cream and tablets

## 2023-05-16 NOTE — Assessment & Plan Note (Signed)
Off statin

## 2023-05-16 NOTE — Assessment & Plan Note (Signed)
Follow-up with Durenda Age Continue with Exelon Exelon may be affecting bowels/bladder

## 2023-05-16 NOTE — Progress Notes (Signed)
Subjective:  Patient ID: Morgan Huff, female    DOB: 15-Apr-1946  Age: 77 y.o. MRN: [id number]  CC: No chief complaint on file.   HPI SHABREKA COULON presents for rash on the L side Co R foot toenails - R big toe - she had acrylic toenail on it C/o memory loss, OAB and frequent formed BMs  Here with sister Vernona Rieger  Outpatient Medications Prior to Visit  Medication Sig Dispense Refill   Ascorbic Acid (VITAMIN C) 500 MG CAPS Take 500 mg by mouth daily.     aspirin 81 MG EC tablet Take 81 mg by mouth daily. Swallow whole.     Biotin 5 MG TABS Take 5,000 mg by mouth daily. Actually 5000 mcg -1 capsule daily     brimonidine (ALPHAGAN) 0.2 % ophthalmic solution SMARTSIG:In Eye(s)     brimonidine-timolol (COMBIGAN) 0.2-0.5 % ophthalmic solution Apply to eye.     calcium carbonate (TUMS - DOSED IN MG ELEMENTAL CALCIUM) 500 MG chewable tablet Chew 2 tablets by mouth daily.      cholecalciferol (VITAMIN D) 1000 units tablet Take 2 tablets (2,000 Units total) by mouth daily. 100 tablet 3   diclofenac Sodium (VOLTAREN) 1 % GEL Apply topically as needed.     glycopyrrolate (ROBINUL) 2 MG tablet Take 1 tablet (2 mg total) by mouth 3 (three) times daily as needed (abdominal cramps). 90 tablet 3   ibuprofen (ADVIL) 200 MG tablet Take 200 mg by mouth every 6 (six) hours as needed.     latanoprost (XALATAN) 0.005 % ophthalmic solution Place 1 drop into the left eye at bedtime.     loratadine (CLARITIN) 10 MG tablet Take 1 tablet (10 mg total) by mouth daily. 100 tablet 3   mirabegron ER (MYRBETRIQ) 50 MG TB24 tablet Take 1 tablet (50 mg total) by mouth daily. 30 tablet 11   Multiple Vitamins-Minerals (CENTRUM SILVER PO) Take 1 tablet by mouth daily.     Polyethyl Glycol-Propyl Glycol 0.4-0.3 % SOLN Place 1-2 drops into both eyes 3 (three) times daily as needed (for dry eyes).      polyethylene glycol powder (GLYCOLAX/MIRALAX) 17 GM/SCOOP powder Take 17 g by mouth daily. 500 g 5   pravastatin  (PRAVACHOL) 10 MG tablet Take 1 tablet (10 mg total) by mouth daily. 90 tablet 3   rivastigmine (EXELON) 1.5 MG capsule Take 1 cap at night for 10 days, then increase to 1 cap twice a day 60 capsule 11   senna (SENOKOT) 8.6 MG tablet Take 1-2 tablets (8.6-17.2 mg total) by mouth daily as needed for constipation. 100 tablet 3   timolol (TIMOPTIC) 0.5 % ophthalmic solution SMARTSIG:In Eye(s)     traMADol (ULTRAM) 50 MG tablet Take by mouth every 8 (eight) hours as needed.     No facility-administered medications prior to visit.    ROS: Review of Systems  Constitutional:  Negative for activity change, appetite change, chills, fatigue and unexpected weight change.  HENT:  Negative for congestion, mouth sores and sinus pressure.   Eyes:  Negative for visual disturbance.  Respiratory:  Negative for cough and chest tightness.   Gastrointestinal:  Negative for abdominal pain, diarrhea and nausea.  Genitourinary:  Positive for frequency and urgency. Negative for difficulty urinating and vaginal pain.  Musculoskeletal:  Negative for back pain and gait problem.  Skin:  Negative for pallor and rash.  Neurological:  Negative for dizziness, tremors, weakness, numbness and headaches.  Psychiatric/Behavioral:  Positive for decreased concentration. Negative  for confusion and sleep disturbance. The patient is nervous/anxious.     Objective:  BP 118/80 (BP Location: Left Arm, Patient Position: Sitting, Cuff Size: Normal)   Pulse 77   Temp 98.1 F (36.7 C) (Oral)   Ht 6\' 2"  (1.88 m)   Wt 234 lb (106.1 kg)   SpO2 96%   BMI 30.04 kg/m   BP Readings from Last 3 Encounters:  05/16/23 118/80  02/10/23 (!) 140/84  11/25/22 130/80    Wt Readings from Last 3 Encounters:  05/16/23 234 lb (106.1 kg)  02/10/23 242 lb (109.8 kg)  11/02/22 252 lb (114.3 kg)    Physical Exam Constitutional:      General: She is not in acute distress.    Appearance: She is well-developed. She is obese.  HENT:      Head: Normocephalic.     Right Ear: External ear normal.     Left Ear: External ear normal.     Nose: Nose normal.  Eyes:     General:        Right eye: No discharge.        Left eye: No discharge.     Conjunctiva/sclera: Conjunctivae normal.     Pupils: Pupils are equal, round, and reactive to light.  Neck:     Thyroid: No thyromegaly.     Vascular: No JVD.     Trachea: No tracheal deviation.  Cardiovascular:     Rate and Rhythm: Normal rate and regular rhythm.     Heart sounds: Normal heart sounds.  Pulmonary:     Effort: No respiratory distress.     Breath sounds: No stridor. No wheezing.  Abdominal:     General: Bowel sounds are normal. There is no distension.     Palpations: Abdomen is soft. There is no mass.     Tenderness: There is no abdominal tenderness. There is no guarding or rebound.  Musculoskeletal:        General: No tenderness.     Cervical back: Normal range of motion and neck supple. No rigidity.  Lymphadenopathy:     Cervical: No cervical adenopathy.  Skin:    Findings: No erythema or rash.  Neurological:     Cranial Nerves: No cranial nerve deficit.     Motor: No abnormal muscle tone.     Coordination: Coordination normal.     Deep Tendon Reflexes: Reflexes normal.  Psychiatric:        Behavior: Behavior normal.        Thought Content: Thought content normal.   Toenail trimmed Alert, cooperative  Lab Results  Component Value Date   WBC 8.1 02/10/2023   HGB 14.2 02/10/2023   HCT 43.6 02/10/2023   PLT 311.0 02/10/2023   GLUCOSE 106 (H) 02/10/2023   CHOL 169 01/28/2022   TRIG 127.0 01/28/2022   HDL 54.40 01/28/2022   LDLDIRECT 85.6 02/20/2008   LDLCALC 89 01/28/2022   ALT 13 02/10/2023   AST 18 02/10/2023   NA 141 02/10/2023   K 4.3 02/10/2023   CL 106 02/10/2023   CREATININE 0.84 02/10/2023   BUN 31 (H) 02/10/2023   CO2 29 02/10/2023   TSH 2.13 02/10/2023   INR 1.00 09/19/2018   HGBA1C 6.0 11/02/2022   MICROALBUR 0.7 04/12/2016     US Carotid Bilateral  Result Date: 06/28/2022 CLINICAL DATA:  77 year old female with a history of stroke EXAM: BILATERAL CAROTID DUPLEX ULTRASOUND TECHNIQUE: Wallace Cullens scale imaging, color Doppler and duplex ultrasound were performed of bilateral  carotid and vertebral arteries in the neck. COMPARISON:  None Available. FINDINGS: Criteria: Quantification of carotid stenosis is based on velocity parameters that correlate the residual internal carotid diameter with NASCET-based stenosis levels, using the diameter of the distal internal carotid lumen as the denominator for stenosis measurement. The following velocity measurements were obtained: RIGHT ICA:  Systolic 63 cm/sec, Diastolic 9 cm/sec CCA:  92 cm/sec SYSTOLIC ICA/CCA RATIO:  0.7 ECA:  60 cm/sec LEFT ICA:  Systolic 67 cm/sec, Diastolic 23 cm/sec CCA:  83 cm/sec SYSTOLIC ICA/CCA RATIO:  0.8 ECA:  55 cm/sec Right Brachial SBP: 191 Left Brachial SBP: 190 RIGHT CAROTID ARTERY: No significant calcified disease of the right common carotid artery. Intermediate waveform maintained. Homogeneous plaque without significant calcifications at the right carotid bifurcation. Low resistance waveform of the right ICA. No significant tortuosity. RIGHT VERTEBRAL ARTERY: Antegrade flow with low resistance waveform. LEFT CAROTID ARTERY: No significant calcified disease of the left common carotid artery. Intermediate waveform maintained. Homogeneous plaque at the left carotid bifurcation without significant calcifications. Low resistance waveform of the left ICA. LEFT VERTEBRAL ARTERY:  Antegrade flow with low resistance waveform. IMPRESSION: Color duplex indicates minimal homogeneous plaque, with no hemodynamically significant stenosis by duplex criteria in the extracranial cerebrovascular circulation. Signed, Yvone Neu. Miachel Roux, RPVI [facility] Electronically Signed   By: Gilmer Mor D.O.   On: 06/28/2022  16:37    Assessment & Plan:   Problem List Items Addressed This Visit     Hyperglycemia - Primary    Check A1c      Relevant Orders   Comprehensive metabolic panel   Hemoglobin A1c   Urinary urgency    On Myrbetriq      CAD (coronary artery disease)    Cont on Pravastatin      Generalized anxiety disorder    Discussed. Pt declined meds, declined ref to psychology, psychiatry... Try Valerian root for anxiety      Hyperlipidemia    Off statin       Memory change    Follow-up with Durenda Age Continue with Exelon Exelon may be affecting bowels/bladder      Intertrigo    Start on Ketoconazole cream and tablets         Meds ordered this encounter  Medications   ketoconazole (NIZORAL) 2 % cream    Sig: Apply 1 Application topically daily.    Dispense:  45 g    Refill:  1   ketoconazole (NIZORAL) 200 MG tablet    Sig: Take 1 tablet (200 mg total) by mouth 2 (two) times daily.    Dispense:  14 tablet    Refill:  0      Follow-up: Return in about 4 months (around 09/16/2023) for a follow-up visit.  Sonda Primes, MD

## 2023-05-16 NOTE — Assessment & Plan Note (Signed)
Cont on  Pravastatin

## 2023-05-16 NOTE — Assessment & Plan Note (Signed)
Discussed. Pt declined meds, declined ref to psychology, psychiatry... Try Valerian root for anxiety

## 2023-05-16 NOTE — Assessment & Plan Note (Signed)
On Myrbetriq

## 2023-05-16 NOTE — Assessment & Plan Note (Signed)
Check A1c.

## 2023-05-23 ENCOUNTER — Ambulatory Visit: Payer: Medicare Other | Admitting: Physician Assistant

## 2023-05-23 ENCOUNTER — Encounter: Payer: Self-pay | Admitting: Physician Assistant

## 2023-05-23 VITALS — BP 138/80 | HR 76 | Resp 18 | Ht 74.0 in

## 2023-05-23 DIAGNOSIS — I6381 Other cerebral infarction due to occlusion or stenosis of small artery: Secondary | ICD-10-CM

## 2023-05-23 DIAGNOSIS — G3184 Mild cognitive impairment, so stated: Secondary | ICD-10-CM

## 2023-05-23 MED ORDER — RIVASTIGMINE TARTRATE 1.5 MG PO CAPS: ORAL_CAPSULE | ORAL | 11 refills | Status: DC

## 2023-05-23 NOTE — Progress Notes (Signed)
Assessment/Plan:   Mild cognitive impairment with memory loss, unclear etiology  Morgan Huff is a very pleasant 77 y.o. RH female with a history of hypertension, hyperlipidemia, osteoarthritis, and a diagnosis of MCI with memory loss of unclear etiology per neuropsych evaluation in 2023, presenting today in follow-up for evaluation of memory loss. Patient is on rivastigmine 1.5 mg twice daily (she had side effects with donepezil and memantine). She is able to participate on her ADLs without difficulty, does not drive. Memory remains stable, MMSE is 27/30 today      Recommendations:   Follow up in 6  months. Continue rivastigmine 1.5 mg twice daily, side effects discussed Repeat neuropsych evaluation in 12 months for clarity of diagnosis and disease trajectory Recommend good control of cardiovascular risk factors. Continue to control mood as per PCP    Subjective:   This patient is accompanied in the office by her sister  who supplements the history. Previous records as well as any outside records available were reviewed prior to todays visit.  Patient was last seen on 11/25/2022    Any changes in memory since last visit? "About the same, if not important I do not pay attention to it "  She continues to have difficulty remembering recent conversations and names of people.  She has some difficulties with notion of time as before.  She lives independently in a living community, but does not enjoy participating in any of the activities.  She does not like doing any crossword puzzles or word finding, but does enjoy reading and watching TV or using the computer. repeats oneself?  Endorsed, especially appointment  Disoriented when walking into a room?  Patient denies   Leaving objects in unusual places?  Patient denies   Wandering behavior?   Denies.   Any personality changes since last visit?  As before, she may become frustrated easily. Any worsening depression?: denies.    Hallucinations or paranoia?  Denies.   Seizures?   denies    Any sleep changes? Sleeps well but wakes up late.  Denies vivid dreams, REM behavior or sleepwalking   Sleep apnea?denies. Any hygiene concerns? Denies.   Independent of bathing and dressing?  Endorsed  Does the patient needs help with medications? Patient is in charge  Who is in charge of the finances?  Patient is in charge    Any changes in appetite?  Denies.   Patient have trouble swallowing?  Denies.   Does the patient cook? Sometimes.  Any kitchen accidents such as leaving the stove on?   Denies.   Any headaches?    Denies.   Vision changes?  She has a history of pigmentary glaucoma followed by ophthalmology, takes eyedrops Chronic pain?  denies   Ambulates with difficulty?  She has a history of L hip arthritis at some point she will need surgery although she is trying to postpone it because she has concerns about anesthesia's and effects on memory. Recent falls or head injuries?    denies      Unilateral weakness, numbness or tingling?   denies   Any tremors?  denies   Any anosmia?    denies   Any incontinence of urine? She has a history of  OAB  Any bowel dysfunction?  Denies.     Patient lives at [facility] Does the patient drive? No longer drives    Initial Visit 05/13/22  How long did patient have memory difficulties?  "I don't until my sister was mentioning ".  She reports decreased concentration.  "In the middle of a story I forget what I was talking about ". Learning names of people is harder. LTM is normal according to her. repeats oneself? Denies  Disoriented when walking into a room?  Patient denies   Leaving objects in unusual places?  Patient denies   Patient lives  [place] lives alone Ambulates  with difficulty?   Patient has a history of arthritis, "bad he ", which may limit her ability to ambulate. Recent falls?  Patient denies   Any head injuries?  Patient denies   History of seizures?    Patient denies   Wandering behavior?  Patient denies   Patient drives?  "I get a little more cautions finding a familiar road".  She reports that has been living in [place] her life, and there is so much construction, but at times it may become more difficult, but she denies being disoriented. Any mood changes ? She denies, but her sister states that "She wakes up in the middle of the night to take a shower, there is distortion with the concept of time".  Patient denies, stating if she is already, and she has to be somewhere, "why not to take a shower?  " Any depression?:  Patient denies. She has anxiety, undergoing "a lot of stress".  She states that is she is a  [employer] and that contributes to situational stress, although she enjoys working. Hallucinations?  Patient denies   Paranoia?  Patient denies   Patient reports that sleeps well without vivid dreams, REM behavior or sleepwalking    History of sleep apnea?  Patient denies   Any hygiene concerns?  Patient denies   Independent of bathing and dressing?  Endorsed  Does the patient needs help with medications? Patient in charge, denies forgetting any doses Who is in charge of the finances? Patient  is in charge   Any changes in appetite?  Patient denies   Patient have trouble swallowing? Patient denies   Does the patient cook?  Patient denies   Any kitchen accidents such as leaving the stove on? Patient denies   Any headaches?  Patient denies   Double vision? Patient denies   Any focal numbness or tingling?  Patient denies   Chronic back pain Patient denies   Unilateral weakness?  Patient denies   Any tremors?  Patient denies   Any history of anosmia?  Patient denies   Any incontinence of urine?  She has a history of OAB on Myrbetriq. Uses a pad "just in case Any bowel dysfunction?   History of constipation  History of heavy alcohol intake?  Patient denies   History of heavy tobacco use?  Patient denies   Family history of  dementia?   Denies Pertinent labs: TSH 2.6, A1C 6.1 nl CBC and CMP, B12 478   Carotid ultrasound 06/21/2022 with minimal homogeneous plaque, without significant stenosis.    Neuropsychological evaluation 11/11/2022 Briefly, results suggested primary impairments surrounding processing speed, cognitive flexibility, verbal fluency, confrontation naming, and all aspects of verbal learning and memory. The etiology of ongoing cognitive impairment is unclear at the present time. It sounds that family and her medical providers have concerns surrounding underlying Alzheimer's disease. I do feel that these concerns are reasonable and Ms. Wills should continue to be monitored over time. Across memory testing, Ms. Mowry did not benefit from repeated exposure to novel information. While she retained about 80% of a previously read story, this percentage only amounted to  her recollection of four items. Retention rates were 0% across a list of words and 38% across a previously drawn complex figure, suggesting some evolving evidence for rapid forgetting. Across verbal memory measures, she also performed very poorly across yes/no recognition trials. 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 Further impairments surrounding semantic fluency and confrontation naming would also follow typical disease trajectory. The fact that she was able to demonstrate some normatively appropriate visual memory performances is encouraging. If Alzheimer's disease is present, it would remain in somewhat early stages at the present time.     Past Medical History:  Diagnosis Date   Abdominal cramps 08/03/2022   Chronic abdominal cramps - periodic. Worse. Treat constipation.  (Pt saw Dr Ewing Schlein, had an MRI).   Allergic rhinitis 08/03/2022   Chronic runny nose - Atrovent did not work  Start Loratidine 10  mg/d   Arthritis    Atrophic vaginitis 04/29/2021   9/22  Dr Rana Snare, dr Arita Miss  On estrocream vag 3/wk PV   Bilateral leg weakness 04/06/2013   Present since 2011-2012. She must push up from a chair   CAD (coronary artery disease) 04/29/2021   2021 CT coronary calcium score of 302.   Cont w/Simvastatin - d/c, ASA  Cont on Pravastatin   Constipation 08/03/2022   Chronic abdominal cramps - periodic. Worse.  (Pt saw Dr Ewing Schlein, had an MRI).   Diverticulosis of colon 10/02/2010   Dyslipidemia 02/20/2008   Simvastatin     NMR Lipoprofile 2005: LDL particle # 1776/ small dense 1106 on Lipitor 20 mg daily.   MGF MI in 21s; father MI @ 34.  No FH CVA  NMR 05/2011:LDL 78 (1169/542), HDL 41, TG 135. LDL goal < 100, ideally < 70.     [facility] panel: Hyperabsorber of  dietary cholesterol; Lp(a) 112; CRP 2.7     12/19 cardiac CT scan for calcium scoring offered     Cont on Pravastatin      Dysrhythmia    palpitations recently   Elevated blood pressure reading without diagnosis of hypertension 04/10/2010   Generalized anxiety disorder 07/29/2021   12/22  Discussed. Pt declined meds, declined ref to psychology, psychiatry... Try Valerian root for anxiety   Glaucoma 03/17/2011   Dr Lottie Dawson, [facility]hth   Groin pain 03/26/2019   8/20 R - ?MSK vs other  R/o hernia  Gen surg ref vs CT discussed  06/2019 CT ok - Dr Ewing Schlein  MSK strain   Heart murmur    History of colonic polyps 03/25/2009       Colonoscopy 2009: Adenomatous polyps, Dr Ewing Schlein.   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   Pigmentary glaucoma of both eyes, mild stage 04/09/2013   Plantar fasciitis, left 06/18/2009   Polycythemia, secondary 04/12/2016   Mild 2016  Pre-diabetes    per patient   Pseudophakia 06/23/2012   TIA (transient ischemic attack) 07/08/2019   12/20 probable-global aphasia, confusion  global aphasia, confusion - no relapse  Doppler (-) B  Card ECHO ordered   Urinary urgency 10/02/2010   Chronic  Dr Rana Snare, Dr McDiarmid d/c. Seeing Dr Arita Miss  ?OAB  Trial of prn Ditropan 2022  Ditropan po  Try estrocream vag 3/wk PV  On Macrobid qd  10/22 Memory loss is worse.  Likely aggravated by daily use of Ditropan.  She can switch to as needed use.  12/22 on Estrocream and Myrbetriq  Discussed anxiety issues. Pt declined meds, declined ref to psychology, psychiatry...  Try Valerian root for anxie   Varicose veins of lower extremities 07/13/2010     Past Surgical History:  Procedure Laterality Date   ABDOMINAL HYSTERECTOMY     ANTERIOR AND POSTERIOR REPAIR WITH SACROSPINOUS FIXATION N/A 06/05/2015   Procedure: ANTERIOR AND POSTERIOR REPAIR WITH SACROSPINOUS LIGAMENT SUSPENSION;  Surgeon: Candice Camp, MD;  Location: [facility];  Service: Gynecology;  Laterality: N/A;   CATARACT EXTRACTION, BILATERAL     lens implant   CHOLECYSTECTOMY     CHOLECYSTECTOMY, LAPAROSCOPIC     Dr Jamey Ripa   COLONOSCOPY  2015   neg; due 2020   colonoscopy with polypectomy     Dr Ewing Schlein; X 3   EYE SURGERY     trabeculectomy ;Dr Lin Givens.Cataract removal OD; Dr Edilia Bo   TOTAL HIP ARTHROPLASTY Right 09/22/2018   Procedure: TOTAL HIP ARTHROPLASTY ANTERIOR APPROACH;  Surgeon: Jodi Geralds, MD;  Location: [facility];  Service: Orthopedics;  Laterality: Right;     PREVIOUS MEDICATIONS:   CURRENT MEDICATIONS:  Outpatient Encounter Medications as of 05/23/2023  Medication Sig   Ascorbic Acid (VITAMIN C) 500 MG CAPS Take 500 mg by mouth daily.   aspirin 81 MG EC tablet Take 81 mg by mouth daily. Swallow whole.    Biotin 5 MG TABS Take 5,000 mg by mouth daily. Actually 5000 mcg -1 capsule daily   brimonidine (ALPHAGAN) 0.2 % ophthalmic solution SMARTSIG:In Eye(s)   brimonidine-timolol (COMBIGAN) 0.2-0.5 % ophthalmic solution Apply to eye.   calcium carbonate (TUMS - DOSED IN MG ELEMENTAL CALCIUM) 500 MG chewable tablet Chew 2 tablets by mouth daily.    cholecalciferol (VITAMIN D) 1000 units tablet Take 2 tablets (2,000 Units total) by mouth daily.   diclofenac Sodium (VOLTAREN) 1 % GEL Apply topically as needed.   glycopyrrolate (ROBINUL) 2 MG tablet Take 1 tablet (2 mg total) by mouth 3 (three) times daily as needed (abdominal cramps).   ibuprofen (ADVIL) 200 MG tablet Take 200 mg by mouth every 6 (six) hours as needed.   ketoconazole (NIZORAL) 2 % cream Apply 1 Application topically daily.   ketoconazole (NIZORAL) 200 MG tablet Take 1 tablet (200 mg total) by mouth 2 (two) times daily.   latanoprost (XALATAN) 0.005 % ophthalmic solution Place 1 drop into the left eye at bedtime.   loratadine (CLARITIN) 10 MG tablet Take 1 tablet (10 mg total) by mouth daily.   mirabegron ER (MYRBETRIQ) 50 MG TB24 tablet Take 1 tablet (50 mg total) by mouth daily.   Multiple Vitamins-Minerals (CENTRUM SILVER PO) Take 1 tablet by mouth daily.   Polyethyl Glycol-Propyl Glycol 0.4-0.3 % SOLN Place 1-2 drops into both eyes 3 (three) times daily as needed (for dry eyes).    polyethylene glycol powder (GLYCOLAX/MIRALAX) 17 GM/SCOOP powder Take 17 g by mouth daily.   pravastatin (PRAVACHOL) 10 MG  tablet Take 1 tablet (10 mg total) by mouth daily.   senna (SENOKOT) 8.6 MG tablet Take 1-2 tablets (8.6-17.2 mg total) by mouth daily as needed for constipation.   timolol (TIMOPTIC) 0.5 % ophthalmic solution SMARTSIG:In Eye(s)   traMADol (ULTRAM) 50 MG tablet Take by mouth every 8 (eight) hours as needed.   [DISCONTINUED] rivastigmine (EXELON) 1.5 MG capsule Take 1 cap at night for 10 days, then increase to 1 cap twice a day    rivastigmine (EXELON) 1.5 MG capsule Take 1 cap at night for 10 days, then increase to 1 cap twice a day   No facility-administered encounter medications on file as of 05/23/2023.     Objective:     PHYSICAL EXAMINATION:    VITALS:   Vitals:   05/23/23 1507  BP: 138/80  Pulse: 76  Resp: 18  Height: 6\' 2"  (1.88 m)    GEN:  The patient appears stated age and is in NAD. HEENT:  Normocephalic, atraumatic.   Neurological examination:  General: NAD, well-groomed, appears stated age. Orientation: The patient is alert. Oriented to person, place and date Cranial nerves: There is good facial symmetry.The speech is fluent and clear, tangential at times . No aphasia or dysarthria. Fund of knowledge is appropriate. Recent memory impaired and remote memory is normal.  Attention and concentration are normal.  Able to name objects and repeat phrases.  Hearing is intact to conversational tone .   Delayed recall 2/3 Sensation: Sensation is intact to light touch throughout Motor: Strength is at least antigravity x4. DTR's 2/4 in UE/LE      05/13/2022   11:00 AM  Montreal Cognitive Assessment   Visuospatial/ Executive (0/5) 2  Naming (0/3) 0  Attention: Read list of digits (0/2) 2  Attention: Read list of letters (0/1) 1  Attention: Serial 7 subtraction starting at 100 (0/3) 0  Language: Repeat phrase (0/2) 2  Language : Fluency (0/1) 1  Abstraction (0/2) 0  Delayed Recall (0/5) 2  Orientation (0/6) 6  Total 16  Adjusted Score (based on education) 16       05/23/2023    5:00 PM 04/13/2017    3:46 PM  MMSE - Mini Mental State Exam  Orientation to time 5 5  Orientation to Place 4 5  Registration 3 3  Attention/ Calculation 5 5  Recall 2 3  Language- name 2 objects 2 2  Language- repeat 1 1  Language- follow 3 step command 3 3  Language- read & follow direction 1 1  Write a sentence 1 1  Copy design 0 1  Total score 27 30       Movement examination: Tone: There is normal  tone in the UE/LE Abnormal movements:  no tremor.  No myoclonus.  No asterixis.   Coordination:  There is no decremation with RAM's. Normal finger to nose  Gait and Station: The patient has no difficulty arising out of a deep-seated chair without the use of the hands. The patient's stride length is good.  Gait is cautious and narrow.   Thank you for allowing [place] the opportunity to participate in the care of this nice patient. Please do not hesitate to contact us for any questions or concerns.   Total time spent on today's visit was 33 minutes dedicated to this patient today, preparing to see patient, examining the patient, ordering tests and/or medications and counseling the patient, documenting clinical information in the EHR or other health record, independently interpreting results and  communicating results to the patient/family, discussing treatment and goals, answering patient's questions and coordinating care.  Cc:  Plotnikov, Georgina Quint, MD  Marlowe Kays 05/23/2023 5:03 PM

## 2023-05-23 NOTE — Patient Instructions (Addendum)
It was a pleasure to see you today at our office.   Recommendations:  Follow up 6 months  Rivastigmine 1.5 mg  twice a day    Whom to call:  Memory  decline, memory medications: Call our office [phone]   For psychiatric meds, mood meds: Please have your primary care physician manage these medications.   For assessment of decision of mental capacity and competency:  Call Dr. Erick Blinks, geriatric psychiatrist at [phone]  For guidance in geriatric dementia issues please call [facility] [phone]   If you have any severe symptoms of a stroke, or other severe issues such as confusion,severe chills or fever, etc call 911 or go to the ER as you may need to be evaluated further       RECOMMENDATIONS FOR ALL PATIENTS WITH MEMORY PROBLEMS: 1. Continue to exercise (Recommend 30 minutes of walking everyday, or 3 hours every week) 2. Increase social interactions - continue going to [place] and enjoy social gatherings with friends and family 3. Eat healthy, avoid fried foods and eat more fruits and vegetables 4. Maintain adequate blood pressure, blood sugar, and blood cholesterol level. Reducing the risk of stroke and cardiovascular disease also helps promoting better memory. 5. Avoid stressful situations. Live a simple life and avoid aggravations. Organize your time and prepare for the next day in anticipation. 6. Sleep well, avoid any interruptions of sleep and avoid any distractions in the bedroom that may interfere with adequate sleep quality 7. Avoid sugar, avoid sweets as there is a strong link between excessive sugar intake, diabetes, and cognitive impairment We discussed the Mediterranean diet, which has been shown to help patients reduce the risk of progressive memory disorders and reduces cardiovascular risk. This includes eating fish, eat fruits and green leafy vegetables, nuts like almonds and hazelnuts, walnuts, and also use olive oil. Avoid fast foods and  fried foods as much as possible. Avoid sweets and sugar as sugar use has been linked to worsening of memory function.  There is always a concern of gradual progression of memory problems. If this is the case, then we may need to adjust level of care according to patient needs. Support, both to the patient and caregiver, should then be put into place.    FALL PRECAUTIONS: Be cautious when walking. Scan the area for obstacles that may increase the risk of trips and falls. When getting up in the mornings, sit up at the edge of the bed for a few minutes before getting out of bed. Consider elevating the bed at the head end to avoid drop of blood pressure when getting up. Walk always in a well-lit room (use night lights in the walls). Avoid area rugs or power cords from appliances in the middle of the walkways. Use a walker or a cane if necessary and consider physical therapy for balance exercise. Get your eyesight checked regularly.  FINANCIAL OVERSIGHT: Supervision, especially oversight when making financial decisions or transactions is also recommended.  HOME SAFETY: Consider the safety of the kitchen when operating appliances like stoves, microwave oven, and blender. Consider having supervision and share cooking responsibilities until no longer able to participate in those. Accidents with firearms and other hazards in the house should be identified and addressed as well.   ABILITY TO BE LEFT ALONE: If patient is unable to contact 911 operator, consider using LifeLine, or when the need is there, arrange for someone to stay with patients. Smoking is a fire hazard, consider supervision or cessation.  Risk of wandering should be assessed by caregiver and if detected at any point, supervision and safe proof recommendations should be instituted.  MEDICATION SUPERVISION: Inability to self-administer medication needs to be constantly addressed. Implement a mechanism to ensure safe administration of the  medications.   DRIVING: Regarding driving, in patients with progressive memory problems, driving will be impaired. We advise to have someone else do the driving if trouble finding directions or if minor accidents are reported. Independent driving assessment is available to determine safety of driving.   If you are interested in the driving assessment, you can contact the following:  [facility] [employer] [facility] in [place] [phone]  [facility] [phone]  [facility] [phone]  [facility] [phone] or [phone]

## 2023-05-27 ENCOUNTER — Ambulatory Visit: Payer: Medicare Other | Admitting: Physician Assistant

## 2023-06-08 DIAGNOSIS — N3941 Urge incontinence: Secondary | ICD-10-CM | POA: Diagnosis not present

## 2023-06-08 DIAGNOSIS — R350 Frequency of micturition: Secondary | ICD-10-CM | POA: Diagnosis not present

## 2023-06-08 DIAGNOSIS — R351 Nocturia: Secondary | ICD-10-CM | POA: Diagnosis not present

## 2023-06-17 ENCOUNTER — Telehealth: Payer: Self-pay | Admitting: Physician Assistant

## 2023-06-17 ENCOUNTER — Other Ambulatory Visit: Payer: Self-pay | Admitting: Physician Assistant

## 2023-06-17 MED ORDER — RIVASTIGMINE TARTRATE 3 MG PO CAPS: ORAL_CAPSULE | ORAL | 11 refills | Status: DC

## 2023-06-17 NOTE — Telephone Encounter (Signed)
I advised and she thanked me for calling.

## 2023-06-17 NOTE — Telephone Encounter (Signed)
Pt sister called and wants to speak to someone about maybe increasing the dosage of Rivastigmine  please call

## 2023-07-07 ENCOUNTER — Other Ambulatory Visit: Payer: Self-pay | Admitting: Physician Assistant

## 2023-07-07 ENCOUNTER — Telehealth: Payer: Self-pay | Admitting: Physician Assistant

## 2023-07-07 MED ORDER — RIVASTIGMINE TARTRATE 1.5 MG PO CAPS: ORAL_CAPSULE | ORAL | 11 refills | Status: DC

## 2023-07-07 NOTE — Telephone Encounter (Signed)
Patients sister Morgan Huff called to let Morgan Huff know the medicine Exelon was hurting Morgan Huff's belly so they decreased her meds back to 1.5mg  twice a day instead of 3mg   They need a refill sent to [facility]

## 2023-07-11 ENCOUNTER — Other Ambulatory Visit: Payer: Self-pay | Admitting: Internal Medicine

## 2023-08-08 DIAGNOSIS — H401331 Pigmentary glaucoma, bilateral, mild stage: Secondary | ICD-10-CM | POA: Diagnosis not present

## 2023-08-10 DIAGNOSIS — Z01419 Encounter for gynecological examination (general) (routine) without abnormal findings: Secondary | ICD-10-CM | POA: Diagnosis not present

## 2023-08-10 DIAGNOSIS — Z6829 Body mass index (BMI) 29.0-29.9, adult: Secondary | ICD-10-CM | POA: Diagnosis not present

## 2023-08-14 LAB — LIPID PANEL
Cholesterol: 152 (ref 0–200)
HDL: 47 (ref 35–70)
LDL Cholesterol: 89
LDl/HDL Ratio: 3.2
Triglycerides: 78 (ref 40–160)

## 2023-08-14 LAB — BASIC METABOLIC PANEL WITH GFR
BUN: 15 (ref 4–21)
CO2: 29 — AB (ref 13–22)
Chloride: 105 (ref 99–108)
Creatinine: 0.9 (ref 0.5–1.1)
Glucose: 87
Potassium: 3.6 meq/L (ref 3.5–5.1)
Sodium: 145 (ref 137–147)

## 2023-08-14 LAB — CBC AND DIFFERENTIAL
HCT: 31 — AB (ref 36–46)
Hemoglobin: 10.5 — AB (ref 12.0–16.0)
Platelets: 180 K/uL (ref 150–400)
WBC: 5.1

## 2023-08-14 LAB — CBC: RBC: 3.47 — AB (ref 3.87–5.11)

## 2023-08-14 LAB — COMPREHENSIVE METABOLIC PANEL WITH GFR
Albumin: 3.2 — AB (ref 3.5–5.0)
Calcium: 8.9 (ref 8.7–10.7)
Globulin: 2.3
eGFR: 62

## 2023-08-14 LAB — HEPATIC FUNCTION PANEL
ALT: 10 U/L (ref 7–35)
AST: 14 (ref 13–35)
Alkaline Phosphatase: 61 (ref 25–125)
Bilirubin, Total: 0.2

## 2023-08-14 LAB — TSH: TSH: 4.87 (ref 0.41–5.90)

## 2023-08-29 ENCOUNTER — Telehealth: Payer: Self-pay | Admitting: Physician Assistant

## 2023-08-29 NOTE — Telephone Encounter (Signed)
Sister of patient called and stated patient needs to move to assistant living because she is not able to do for her self anymore (can no longer prepare her own meals, use microwave, nor is she getting out around people) they think the assistant living will be better for her to get her back socializing. Caller stated patient needs to be told verbally from [place] that this will be good for her otherwise she wont go. Caller tried getting a soon appointment but next available is not until 09/19/23. Would like feedback from nurse

## 2023-09-06 ENCOUNTER — Encounter: Payer: Self-pay | Admitting: Physician Assistant

## 2023-09-06 ENCOUNTER — Ambulatory Visit (INDEPENDENT_AMBULATORY_CARE_PROVIDER_SITE_OTHER): Payer: Medicare Other | Admitting: Physician Assistant

## 2023-09-06 VITALS — BP 150/74 | HR 66 | Resp 20 | Wt 226.0 lb

## 2023-09-06 DIAGNOSIS — G3184 Mild cognitive impairment, so stated: Secondary | ICD-10-CM | POA: Diagnosis not present

## 2023-09-06 MED ORDER — RIVASTIGMINE TARTRATE 3 MG PO CAPS: ORAL_CAPSULE | ORAL | 11 refills | Status: DC

## 2023-09-06 NOTE — Patient Instructions (Signed)
 It was a pleasure to see you today at our office.   Recommendations:  Follow up in late June -July Rivastigmine  3 mg  twice a day    Whom to call:  Memory  decline, memory medications: Call our office [phone]   For psychiatric meds, mood meds: Please have your primary care physician manage these medications.   For assessment of decision of mental capacity and competency:  Call Dr. Rosaline Nine, geriatric psychiatrist at [phone]  For guidance in geriatric dementia issues please call [facility] [phone]   If you have any severe symptoms of a stroke, or other severe issues such as confusion,severe chills or fever, etc call 911 or go to the ER as you may need to be evaluated further       RECOMMENDATIONS FOR ALL PATIENTS WITH MEMORY PROBLEMS: 1. Continue to exercise (Recommend 30 minutes of walking everyday, or 3 hours every week) 2. Increase social interactions - continue going to [place] and enjoy social gatherings with friends and family 3. Eat healthy, avoid fried foods and eat more fruits and vegetables 4. Maintain adequate blood pressure, blood sugar, and blood cholesterol level. Reducing the risk of stroke and cardiovascular disease also helps promoting better memory. 5. Avoid stressful situations. Live a simple life and avoid aggravations. Organize your time and prepare for the next day in anticipation. 6. Sleep well, avoid any interruptions of sleep and avoid any distractions in the bedroom that may interfere with adequate sleep quality 7. Avoid sugar, avoid sweets as there is a strong link between excessive sugar intake, diabetes, and cognitive impairment We discussed the Mediterranean diet, which has been shown to help patients reduce the risk of progressive memory disorders and reduces cardiovascular risk. This includes eating fish, eat fruits and green leafy vegetables, nuts like almonds and hazelnuts, walnuts, and also use olive oil. Avoid fast  foods and fried foods as much as possible. Avoid sweets and sugar as sugar use has been linked to worsening of memory function.  There is always a concern of gradual progression of memory problems. If this is the case, then we may need to adjust level of care according to patient needs. Support, both to the patient and caregiver, should then be put into place.    FALL PRECAUTIONS: Be cautious when walking. Scan the area for obstacles that may increase the risk of trips and falls. When getting up in the mornings, sit up at the edge of the bed for a few minutes before getting out of bed. Consider elevating the bed at the head end to avoid drop of blood pressure when getting up. Walk always in a well-lit room (use night lights in the walls). Avoid area rugs or power cords from appliances in the middle of the walkways. Use a walker or a cane if necessary and consider physical therapy for balance exercise. Get your eyesight checked regularly.  FINANCIAL OVERSIGHT: Supervision, especially oversight when making financial decisions or transactions is also recommended.  HOME SAFETY: Consider the safety of the kitchen when operating appliances like stoves, microwave oven, and blender. Consider having supervision and share cooking responsibilities until no longer able to participate in those. Accidents with firearms and other hazards in the house should be identified and addressed as well.   ABILITY TO BE LEFT ALONE: If patient is unable to contact 911 operator, consider using [facility], or when the need is there, arrange for someone to stay with patients. Smoking is a fire hazard, consider supervision or  cessation. Risk of wandering should be assessed by caregiver and if detected at any point, supervision and safe proof recommendations should be instituted.  MEDICATION SUPERVISION: Inability to self-administer medication needs to be constantly addressed. Implement a mechanism to ensure safe administration of the  medications.   DRIVING: Regarding driving, in patients with progressive memory problems, driving will be impaired. We advise to have someone else do the driving if trouble finding directions or if minor accidents are reported. Independent driving assessment is available to determine safety of driving.   If you are interested in the driving assessment, you can contact the following:  [facility] in [place] [phone]  [facility] [phone]  [facility] [phone]  [facility] [phone] or [phone]

## 2023-09-06 NOTE — Progress Notes (Signed)
 Assessment/Plan:   Mild Cognitive Impairment with memory loss, unclear etiology   Morgan Huff is a very pleasant 78 y.o. RH female with a history of hypertension, hyperlipidemia, osteoarthritis, and a diagnosis of MCI with memory loss of unclear etiology per neuropsych evaluation in 2023, presenting prior to her scheduled appointment due to worsening cognitive status.  She is on rivastigmine  1.5 mg twice daily (side effects with donepezil  or memantine ). Today's MMSE is 23/30 with decline in delayed recall.  Discussed increasing the dose to 3 mg twice daily, however, it was noted on her med list that she was started on Gemtesa by GU, and this medication may contribute to some of her memory decline. She is to hold that med and discuss other options with her physician and will revisit during her next appointment if this is indeed necessary.  She is able to participate in her ADLs, does not drive. Sister is concerned that the patient is not able to prepare her meals and would like for fer to move to Assisted Living. She is reluctant to do so. Will continue to monitor.    Follow up in 6  months. Continue rivastigmine  1.5 mg twice daily, side effects discussed. Repeat neuropsych evaluation for diagnostic clarity and disease trajectory  Recommend good control of her cardiovascular risk factors Continue to control mood as per PCP     Subjective:    This patient is accompanied in the office by her sister who supplements the history.  Previous records as well as any outside records available were reviewed prior to todays visit. Patient was last seen on 05/23/2023   Any changes in memory since last visit?  She continues to have difficulty remembering conversations and names, perhaps more than before.  She lives in an independent living community but does not enjoy participating in any of the activities.  Likes drawing by number,  She does not like doing any brain games.  She enjoys watching TV,  reading or using the computer. repeats oneself?  Endorsed Disoriented when walking into a room?  She may become easily frustrated.  Leaving objects?  May misplace things but not in unusual places.  Wandering behavior?  denies   Any personality changes since last visit?  denies   Any worsening depression?:  Denies.   Hallucinations or paranoia?  Denies.   Seizures? denies    Any sleep changes? Sleeps well every chance I get. Denies vivid dreams, REM behavior or sleepwalking   Sleep apnea?   Denies.   Any hygiene concerns? Denies.  Independent of bathing and dressing?  Endorsed  Does the patient needs help with medications?  Patient is in charge   Who is in charge of the finances?  Patient is in charge     Any changes in appetite?  Denies.    Patient have trouble swallowing? Denies.   Does the patient cook?  Not much. Sister states that she does not prepare her own meals, wants her to move to [place] to have ready made meals for her.  Any headaches?   denies   Chronic back pain  denies   Ambulates with difficulty? Denies.   Recent falls or head injuries? denies     Unilateral weakness, numbness or tingling? denies   Any tremors?  Denies   Any anosmia?  Denies   Any incontinence of urine?  She has a history of OAB. Any bowel dysfunction?   Denies.       Patient lives at [facility]  Does the patient drive? No longer drives    Initial Visit 05/13/22  How long did patient have memory difficulties?  I don't until my sister was mentioning . She reports decreased concentration.  In the middle of a story I forget what I was talking about . Learning names of people is harder. LTM is normal according to her. repeats oneself? Denies  Disoriented when walking into a room?  Patient denies   Leaving objects in unusual places?  Patient denies   Patient lives  [facility] lives alone Ambulates  with difficulty?   Patient has a history of arthritis, bad he , which may limit her ability to  ambulate. Recent falls?  Patient denies   Any head injuries?  Patient denies   History of seizures?   Patient denies   Wandering behavior?  Patient denies   Patient drives?  I get a little more cautions finding a familiar road.  She reports that has been living in [place] her life, and there is so much construction, but at times it may become more difficult, but she denies being disoriented. Any mood changes ? She denies, but her sister states that She wakes up in the middle of the night to take a shower, there is distortion with the concept of time.  Patient denies, stating if she is already, and she has to be somewhere, why not to take a shower?   Any depression?:  Patient denies. She has anxiety, undergoing a lot of stress.  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 Hallucinations?  Patient denies   Paranoia?  Patient denies   Patient reports that sleeps well without vivid dreams, REM behavior or sleepwalking    History of sleep apnea?  Patient denies   Any hygiene concerns?  Patient denies   Independent of bathing and dressing?  Endorsed  Does the patient needs help with medications? Patient in charge, denies forgetting any doses Who is in charge of the finances? Patient  is in charge   Any changes in appetite?  Patient denies   Patient have trouble swallowing? Patient denies   Does the patient cook?  Patient denies   Any kitchen accidents such as leaving the stove on? Patient denies   Any headaches?  Patient denies   Double vision? Patient denies   Any focal numbness or tingling?  Patient denies   Chronic back pain Patient denies   Unilateral weakness?  Patient denies   Any tremors?  Patient denies   Any history of anosmia?  Patient denies   Any incontinence of urine?  She has a history of OAB on Myrbetriq . Uses a pad just in case Any bowel dysfunction?   History of constipation  History of  heavy alcohol intake?  Patient denies   History of heavy tobacco use?  Patient denies   Family history of dementia?   Denies Pertinent labs: TSH 2.6, A1C 6.1 nl CBC and CMP, B12 478  Carotid ultrasound 06/21/2022 with minimal homogeneous plaque, without significant stenosis.    Neuropsychological evaluation 11/11/2022 Briefly, results suggested primary impairments surrounding processing speed, cognitive flexibility, verbal fluency, confrontation naming, and all aspects of verbal learning and memory. The etiology of ongoing cognitive impairment is unclear at the present time. It sounds that family and her medical providers have concerns surrounding underlying Alzheimer's disease. I do feel that these concerns are reasonable and Ms. Dobbins should continue to be monitored over time.  Across memory testing, Ms. Himmelberger did not benefit from repeated exposure to novel information. While she retained about 80% of a previously read story, this percentage only amounted to her recollection of four items. Retention rates were 0% across a list of words and 38% across a previously drawn complex figure, suggesting some evolving evidence for rapid forgetting. Across verbal memory measures, she also performed very poorly across yes/no recognition trials. 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 Further impairments surrounding semantic fluency and confrontation naming would also follow typical disease trajectory. The fact that she was able to demonstrate some normatively appropriate visual memory performances is encouraging. If Alzheimer's disease is present, it would remain in somewhat early stages at the present time.   PREVIOUS MEDICATIONS:   CURRENT MEDICATIONS:  Outpatient Encounter Medications as of 09/06/2023  Medication Sig   Ascorbic Acid (VITAMIN C) 500 MG CAPS Take 500  mg by mouth daily.   aspirin  81 MG EC tablet Take 81 mg by mouth daily. Swallow whole.   Biotin 5 MG TABS Take 5,000 mg by mouth daily. Actually 5000 mcg -1 capsule daily   brimonidine (ALPHAGAN) 0.2 % ophthalmic solution SMARTSIG:In Eye(s)   brimonidine-timolol  (COMBIGAN) 0.2-0.5 % ophthalmic solution Apply to eye.   calcium carbonate (TUMS - DOSED IN MG ELEMENTAL CALCIUM) 500 MG chewable tablet Chew 2 tablets by mouth daily.    cholecalciferol (VITAMIN D ) 1000 units tablet Take 2 tablets (2,000 Units total) by mouth daily.   diclofenac Sodium (VOLTAREN) 1 % GEL Apply topically as needed.   glycopyrrolate  (ROBINUL ) 2 MG tablet Take 1 tablet (2 mg total) by mouth 3 (three) times daily as needed (abdominal cramps).   ibuprofen  (ADVIL ) 200 MG tablet Take 200 mg by mouth every 6 (six) hours as needed.   ketoconazole  (NIZORAL ) 2 % cream Apply 1 Application topically daily.   ketoconazole  (NIZORAL ) 200 MG tablet Take 1 tablet (200 mg total) by mouth 2 (two) times daily.   latanoprost (XALATAN) 0.005 % ophthalmic solution Place 1 drop into the left eye at bedtime.   loratadine  (CLARITIN ) 10 MG tablet Take 1 tablet (10 mg total) by mouth daily.   mirabegron  ER (MYRBETRIQ ) 50 MG TB24 tablet Take 1 tablet (50 mg total) by mouth daily.   Multiple Vitamins-Minerals (CENTRUM SILVER  PO) Take 1 tablet by mouth daily.   Polyethyl Glycol-Propyl Glycol 0.4-0.3 % SOLN Place 1-2 drops into both eyes 3 (three) times daily as needed (for dry eyes).    polyethylene glycol powder (GLYCOLAX /MIRALAX ) 17 GM/SCOOP powder Take 17 g by mouth daily.   pravastatin  (PRAVACHOL ) 10 MG tablet TAKE 1 TABLET BY MOUTH DAILY   rivastigmine  (EXELON ) 3 MG capsule Take 1 cap  twice a day   senna (SENOKOT) 8.6 MG tablet Take 1-2 tablets (8.6-17.2 mg total) by mouth daily as needed for constipation.   timolol  (TIMOPTIC ) 0.5 % ophthalmic solution SMARTSIG:In Eye(s)   traMADol  (ULTRAM ) 50 MG tablet Take by mouth every 8 (eight) hours as  needed.   [DISCONTINUED] rivastigmine  (EXELON ) 1.5 MG capsule Take 1 cap  twice a day   No facility-administered encounter medications on file as of 09/06/2023.       09/06/2023    1:00 PM 05/23/2023    5:00 PM 04/13/2017    3:46 PM  MMSE - Mini Mental State Exam  Orientation to time 2 5 5   Orientation to Place 5  4 5  Registration 3 3 3   Attention/ Calculation 5 5 5   Recall 0 2 3  Language- name 2 objects 2 2 2   Language- repeat 1 1 1   Language- follow 3 step command 3 3 3   Language- read & follow direction 1 1 1   Write a sentence 1 1 1   Copy design 0 0 1  Total score 23 27 30       05/13/2022   11:00 AM  Montreal Cognitive Assessment   Visuospatial/ Executive (0/5) 2  Naming (0/3) 0  Attention: Read list of digits (0/2) 2  Attention: Read list of letters (0/1) 1  Attention: Serial 7 subtraction starting at 100 (0/3) 0  Language: Repeat phrase (0/2) 2  Language : Fluency (0/1) 1  Abstraction (0/2) 0  Delayed Recall (0/5) 2  Orientation (0/6) 6  Total 16  Adjusted Score (based on education) 16    Objective:     PHYSICAL EXAMINATION:    VITALS:   Vitals:   09/06/23 1300  BP: (!) 150/80  Pulse: 66  Resp: 20  SpO2: 96%  Weight: 226 lb (102.5 kg)    GEN:  The patient appears stated age and is in NAD. HEENT:  Normocephalic, atraumatic.   Neurological examination:  General: NAD, well-groomed, appears stated age. Orientation: The patient is alert. Oriented to person, place and not to date Cranial nerves: There is good facial symmetry.The speech is fluent and clear, tangential at times. No aphasia or dysarthria. Fund of knowledge is appropriate. Recent and remote memory are impaired. Attention and concentration are reduced.  Able to name objects and repeat phrases.  Hearing is intact to conversational tone.  Sensation: Sensation is intact to light touch throughout Motor: Strength is at least antigravity x4. DTR's 2/4 in UE/LE     Movement examination: Tone:  There is normal tone in the UE/LE Abnormal movements:  no tremor.  No myoclonus.  No asterixis.   Coordination:  There is no decremation with RAM's. Normal finger to nose  Gait and Station: The patient has no difficulty arising out of a deep-seated chair without the use of the hands. The patient's stride length is good.  Gait is cautious and narrow.    Thank you for allowing us  the opportunity to participate in the care of this nice patient. Please do not hesitate to contact us  for any questions or concerns.   Total time spent on today's visit was 40 minutes dedicated to this patient today, preparing to see patient, examining the patient, ordering tests and/or medications and counseling the patient, documenting clinical information in the EHR or other health record, independently interpreting results and communicating results to the patient/family, discussing treatment and goals, answering patient's questions and coordinating care.  Cc:  Plotnikov, Aleksei V, MD  Camie Sevin 09/06/2023 1:21 PM

## 2023-09-08 ENCOUNTER — Telehealth: Payer: Self-pay | Admitting: Physician Assistant

## 2023-09-08 DIAGNOSIS — R413 Other amnesia: Secondary | ICD-10-CM

## 2023-09-08 DIAGNOSIS — R4189 Other symptoms and signs involving cognitive functions and awareness: Secondary | ICD-10-CM

## 2023-09-08 DIAGNOSIS — G3184 Mild cognitive impairment, so stated: Secondary | ICD-10-CM

## 2023-09-08 DIAGNOSIS — G459 Transient cerebral ischemic attack, unspecified: Secondary | ICD-10-CM

## 2023-09-08 NOTE — Telephone Encounter (Signed)
 Sister cld to have Pt. Referral for speech therapy sent to [facility] (Speech Therapy) [phone] phn#[phone]

## 2023-09-09 NOTE — Telephone Encounter (Signed)
 Sent referral to [facility].

## 2023-09-14 ENCOUNTER — Ambulatory Visit: Payer: Medicare Other

## 2023-09-14 VITALS — Ht 74.0 in | Wt 226.0 lb

## 2023-09-14 DIAGNOSIS — M8580 Other specified disorders of bone density and structure, unspecified site: Secondary | ICD-10-CM

## 2023-09-14 DIAGNOSIS — Z780 Asymptomatic menopausal state: Secondary | ICD-10-CM

## 2023-09-14 DIAGNOSIS — Z1231 Encounter for screening mammogram for malignant neoplasm of breast: Secondary | ICD-10-CM

## 2023-09-14 DIAGNOSIS — Z0000 Encounter for general adult medical examination without abnormal findings: Secondary | ICD-10-CM

## 2023-09-14 NOTE — Patient Instructions (Signed)
Ms. Culliton , Thank you for taking time to come for your Medicare Wellness Visit. I appreciate your ongoing commitment to your health goals. Please review the following plan we discussed and let me know if I can assist you in the future.   Referrals/Orders/Follow-Ups/Clinician Recommendations: Aim for 30 minutes of exercise or brisk walking, 6-8 glasses of water, and 5 servings of fruits and vegetables each day. Ordered repeat Mammogram and DEXA scan (at [facility]).  This is a list of the screening recommended for you and due dates:  Health Maintenance  Topic Date Due   COVID-19 Vaccine (10 - 2024-25 season) 04/03/2023   Medicare Annual Wellness Visit  09/13/2024   DTaP/Tdap/Td vaccine (3 - Td or Tdap) 06/25/2029   Pneumonia Vaccine  Completed   Flu Shot  Completed   DEXA scan (bone density measurement)  Completed   Hepatitis C Screening  Completed   Zoster (Shingles) Vaccine  Completed   HPV Vaccine  Aged Out   Colon Cancer Screening  Discontinued    Advanced directives: (In Chart) A copy of your advanced directives are scanned into your chart should your provider ever need it.  Next Medicare Annual Wellness Visit scheduled for next year: Yes - 09/19/23

## 2023-09-14 NOTE — Progress Notes (Signed)
Subjective:   Morgan Huff is a 78 y.o. female who presents for Medicare Annual (Subsequent) preventive examination. (Pt of Dr Posey Rea)  Visit Complete: Virtual I connected with  Morgan Huff on 09/14/23 by a audio enabled telemedicine application and verified that I am speaking with the correct person using two identifiers.  Patient Location: Home  Provider Location: Office/Clinic  I discussed the limitations of evaluation and management by telemedicine. The patient expressed understanding and agreed to proceed.  Vital Signs: Because this visit was a virtual/telehealth visit, some criteria may be missing or patient reported. Any vitals not documented were not able to be obtained and vitals that have been documented are patient reported.  Patient Medicare AWV questionnaire was completed by the patient on 09/06/23; I have confirmed that all information answered by patient is correct and no changes since this date.  Cardiac Risk Factors include: advanced age (>64men, >33 women);dyslipidemia     Objective:    Today's Vitals   09/14/23 1411  Weight: 226 lb (102.5 kg)  Height: 6\' 2"  (1.88 m)   Body mass index is 29.02 kg/m.     09/14/2023    2:04 PM 09/06/2023    1:01 PM 05/23/2023    3:12 PM 11/25/2022    2:57 PM 08/16/2022   11:06 AM 08/09/2022    1:09 PM 06/14/2022    2:39 PM  Advanced Directives  Does Patient Have a Medical Advance Directive? Yes Yes Yes Yes Yes Yes Yes  Type of Social research officer, government Power of [employer] Power of [place];Living will Healthcare Power of [place];Living will;Out of facility DNR (pink MOST or yellow form)  Does patient want to make changes to medical advance directive?  No - Patient declined No - Patient declined No - Patient declined     Copy of Healthcare Power of Attorney in Chart? No - copy requested No - copy requested No - copy requested   No - copy requested      Current Medications (verified) Outpatient Encounter Medications as of 09/14/2023  Medication Sig   Ascorbic Acid (VITAMIN C) 500 MG CAPS Take 500 mg by mouth daily.   aspirin 81 MG EC tablet Take 81 mg by mouth daily. Swallow whole.   Biotin 5 MG TABS Take 5,000 mg by mouth daily. Actually 5000 mcg -1 capsule daily   brimonidine (ALPHAGAN) 0.2 % ophthalmic solution SMARTSIG:In Eye(s)   brimonidine-timolol (COMBIGAN) 0.2-0.5 % ophthalmic solution Apply to eye.   calcium carbonate (TUMS - DOSED IN MG ELEMENTAL CALCIUM) 500 MG chewable tablet Chew 2 tablets by mouth daily.    cholecalciferol (VITAMIN D) 1000 units tablet Take 2 tablets (2,000 Units total) by mouth daily.   diclofenac Sodium (VOLTAREN) 1 % GEL Apply topically as needed.   glycopyrrolate (ROBINUL) 2 MG tablet Take 1 tablet (2 mg total) by mouth 3 (three) times daily as needed (abdominal cramps).   ibuprofen (ADVIL) 200 MG tablet Take 200 mg by mouth every 6 (six) hours as needed.   ketoconazole (NIZORAL) 2 % cream Apply 1 Application topically daily.   ketoconazole (NIZORAL) 200 MG tablet Take 1 tablet (200 mg total) by mouth 2 (two) times daily.   latanoprost (XALATAN) 0.005 % ophthalmic solution Place 1 drop into the left eye at bedtime.   loratadine (CLARITIN) 10 MG tablet Take 1 tablet (10 mg total) by mouth daily.   mirabegron ER (MYRBETRIQ) 50 MG TB24 tablet Take 1  tablet (50 mg total) by mouth daily.   Multiple Vitamins-Minerals (CENTRUM SILVER PO) Take 1 tablet by mouth daily.   Polyethyl Glycol-Propyl Glycol 0.4-0.3 % SOLN Place 1-2 drops into both eyes 3 (three) times daily as needed (for dry eyes).    polyethylene glycol powder (GLYCOLAX/MIRALAX) 17 GM/SCOOP powder Take 17 g by mouth daily.   pravastatin (PRAVACHOL) 10 MG tablet TAKE 1 TABLET BY MOUTH DAILY   rivastigmine (EXELON) 3 MG capsule Take 1 cap  twice a day   senna (SENOKOT) 8.6 MG tablet Take 1-2 tablets (8.6-17.2 mg total) by mouth daily as needed for  constipation.   timolol (TIMOPTIC) 0.5 % ophthalmic solution SMARTSIG:In Eye(s)   traMADol (ULTRAM) 50 MG tablet Take by mouth every 8 (eight) hours as needed.   No facility-administered encounter medications on file as of 09/14/2023.    Allergies (verified) Donepezil, Namenda [memantine], and Latex   History: Past Medical History:  Diagnosis Date   Abdominal cramps 08/03/2022   Chronic abdominal cramps - periodic. Worse. Treat constipation.  (Pt saw Dr Ewing Schlein, had an MRI).   Allergic rhinitis 08/03/2022   Chronic runny nose - Atrovent did not work  Start Loratidine 10 mg/d   Arthritis    Atrophic vaginitis 04/29/2021   9/22  Dr Rana Snare, dr Arita Miss  On estrocream vag 3/wk PV   Bilateral leg weakness 04/06/2013   Present since 2011-2012. She must push up from a chair   CAD (coronary artery disease) 04/29/2021   2021 CT coronary calcium score of 302.   Cont w/Simvastatin - d/c, ASA  Cont on Pravastatin   Constipation 08/03/2022   Chronic abdominal cramps - periodic. Worse.  (Pt saw Dr Ewing Schlein, had an MRI).   Diverticulosis of colon 10/02/2010   Dyslipidemia 02/20/2008   Simvastatin     NMR Lipoprofile 2005: LDL particle # 1776/ small dense 1106 on Lipitor 20 mg daily.   MGF MI in 71s; father MI @ 59.  No FH CVA  NMR 05/2011:LDL 78 (1169/542), HDL 41, TG 135. LDL goal < 100, ideally < 70.     [facility] panel: Hyperabsorber of  dietary cholesterol; Lp(a) 112; CRP 2.7     12/19 cardiac CT scan for calcium scoring offered     Cont on Pravastatin      Dysrhythmia    palpitations recently   Elevated blood pressure reading without diagnosis of hypertension 04/10/2010   Generalized anxiety disorder 07/29/2021   12/22  Discussed. Pt declined meds, declined ref to psychology, psychiatry... Try Valerian root for anxiety   Glaucoma 03/17/2011   Dr Lottie Dawson, [facility]   Groin pain 03/26/2019   8/20 R - ?MSK vs other  R/o hernia  Gen surg ref vs CT discussed  06/2019 CT ok - Dr Ewing Schlein  MSK strain   Heart  murmur    History of colonic polyps 03/25/2009       Colonoscopy 2009: Adenomatous polyps, Dr Ewing Schlein.   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   Pigmentary glaucoma of both eyes, mild stage 04/09/2013   Plantar fasciitis, left 06/18/2009   Polycythemia, secondary 04/12/2016   Mild 2016   Pre-diabetes    per patient   Pseudophakia 06/23/2012   TIA (transient ischemic attack) 07/08/2019   12/20 probable-global aphasia, confusion  global aphasia, confusion - no relapse  Doppler (-) B  Card ECHO ordered   Urinary urgency 10/02/2010   Chronic  Dr Rana Snare, Dr McDiarmid d/c. Seeing Dr Arita Miss  ?OAB  Trial of prn Ditropan 2022  Ditropan po  Try estrocream vag 3/wk PV  On Macrobid qd  10/22 Memory loss is worse.  Likely aggravated by daily use of Ditropan.  She can switch to as needed use.  12/22 on Estrocream and Myrbetriq  Discussed anxiety issues. Pt declined meds, declined ref to psychology, psychiatry...  Try Valerian root for anxie   Varicose veins of lower extremities 07/13/2010   Past Surgical History:  Procedure Laterality Date   ABDOMINAL HYSTERECTOMY     ANTERIOR AND POSTERIOR REPAIR WITH SACROSPINOUS FIXATION N/A 06/05/2015   Procedure: ANTERIOR AND POSTERIOR REPAIR WITH SACROSPINOUS LIGAMENT SUSPENSION;  Surgeon: Candice Camp, MD;  Location: [facility];  Service: Gynecology;  Laterality: N/A;   CATARACT EXTRACTION, BILATERAL     lens implant   CHOLECYSTECTOMY     CHOLECYSTECTOMY, LAPAROSCOPIC     Dr Jamey Ripa   COLONOSCOPY  2015   neg; due 2020    colonoscopy with polypectomy     Dr Ewing Schlein; X 3   EYE SURGERY     trabeculectomy ;Dr Lin Givens.Cataract removal OD; Dr Edilia Bo   TOTAL HIP ARTHROPLASTY Right 09/22/2018   Procedure: TOTAL HIP ARTHROPLASTY ANTERIOR APPROACH;  Surgeon: Jodi Geralds, MD;  Location: [facility];  Service: Orthopedics;  Laterality: Right;   Family History  Problem Relation Age of Onset   Polycythemia Father        transition into leukemia   Hypokalemia Father    Leukemia Father    Glaucoma Father    Heart attack Father 60       in context hypokalemia   Lung cancer Mother        smoker   Heart attack Maternal Grandfather 7   Breast cancer Maternal Grandmother    Melanoma Brother    Diabetes Neg Hx    Stroke Neg Hx    Social History   Socioeconomic History   Marital status: Single    Spouse name: Not on file   Number of children: 0   Years of education: 16   Highest education level: Bachelor's degree (e.g., BA, AB, BS)  Occupational History   OccupationPublishing rights manager of shows, semi-retired  Tobacco Use   Smoking status: Never   Smokeless tobacco: Never  Vaping Use   Vaping status: Never Used  Substance and Sexual Activity   Alcohol use: Not Currently    Comment:  rarely   Drug use: No   Sexual activity: Not Currently  Other Topics Concern   Not on file  Social History Narrative   Right handed   Drinks caffeine prn    One story home [place]   No children   retired   Social Drivers of [employer] Strain: Low Risk  (09/14/2023)   Overall Financial Resource Strain (CARDIA)    Difficulty of Paying Living Expenses: Not hard at all  Food Insecurity: No Food Insecurity (09/14/2023)   Hunger Vital  Sign    Worried About Programme researcher, broadcasting/film/video in the Last Year: Never true    Ran Out of Food in the Last Year: Never true  Transportation Needs: No Transportation Needs (09/14/2023)   PRAPARE - Administrator, Civil Service (Medical): No    Lack of Transportation  (Non-Medical): No  Physical Activity: Insufficiently Active (09/14/2023)   Exercise Vital Sign    Days of Exercise per Week: 7 days    Minutes of Exercise per Session: 10 min  Stress: No Stress Concern Present (09/14/2023)   [employer] of Occupational Health - Occupational Stress Questionnaire    Feeling of Stress : Not at all  Social Connections: Socially Isolated (09/14/2023)   Social Connection and Isolation Panel [NHANES]    Frequency of Communication with Friends and Family: Once a week    Frequency of Social Gatherings with Friends and Family: Once a week    Attends Religious Services: Never    Database administrator or Organizations: No    Attends Engineer, structural: Never    Marital Status: Never married    Tobacco Counseling Counseling given: Not Answered   Clinical Intake:  Pre-visit preparation completed: Yes  Pain : No/denies pain     BMI - recorded: 29.02 Nutritional Status: BMI 25 -29 Overweight Nutritional Risks: None Diabetes: No  How often do you need to have someone help you when you read instructions, pamphlets, or other written materials from your doctor or pharmacy?: 1 - Never  Interpreter Needed?: No  Information entered by :: Hassell Halim, CMA   Activities of Daily Living    09/14/2023    2:15 PM  In your present state of health, do you have any difficulty performing the following activities:  Hearing? 0  Vision? 0  Difficulty concentrating or making decisions? 0  Walking or climbing stairs? 0  Dressing or bathing? 0  Doing errands, shopping? 0  Preparing Food and eating ? N  Using the Toilet? N  In the past six months, have you accidently leaked urine? Y  Comment wears a pad  Do you have problems with loss of bowel control? N  Managing your Medications? N  Managing your Finances? N  Housekeeping or managing your Housekeeping? N    Patient Care Team: Plotnikov, Georgina Quint, MD as PCP - General (Internal  Medicine) Vida Rigger, MD as Consulting Physician (Gastroenterology) Candice Camp, MD as Consulting Physician (Obstetrics and Gynecology) Enid Baas, MD as Consulting Physician (Sports Medicine) Jodi Geralds, MD as Consulting Physician (Orthopedic Surgery) Alfredo Martinez, MD as Consulting Physician (Urology) Noel Christmas, MD as Consulting Physician (Urology) Elwyn Reach (Neurology) Bond, Doran Stabler, MD as Referring Physician (Ophthalmology)  Indicate any recent Medical Services you may have received from other than [facility] providers in the past year (date may be approximate).     Assessment:   This is a routine wellness examination for [place].  Hearing/Vision screen Hearing Screening - Comments:: Denies hearing difficulties   Vision Screening - Comments:: Wears rx glasses - up to date with routine eye exams with  Dr Lottie Dawson   Goals Addressed               This Visit's Progress     Patient Stated (pt-stated)        Patient stated she plans to continue exercising.       Depression Screen    09/14/2023    2:24 PM 05/16/2023    1:29  PM 02/10/2023    3:53 PM 08/09/2022    1:09 PM 08/03/2022    2:07 PM 08/06/2021    3:10 PM 04/29/2021    3:24 PM  PHQ 2/9 Scores  PHQ - 2 Score 0 0 0 0 0 0 0  PHQ- 9 Score       0    Fall Risk    09/14/2023    2:16 PM 09/06/2023    1:01 PM 05/23/2023    3:14 PM 05/16/2023    1:29 PM 02/10/2023    3:53 PM  Fall Risk   Falls in the past year? 0 0 0 0 0  Number falls in past yr: 0 0 0 0 0  Injury with Fall? 0 0 0 0 0  Risk for fall due to : No Fall Risks   No Fall Risks   Follow up Falls evaluation completed;Falls prevention discussed Falls evaluation completed Falls evaluation completed Falls evaluation completed Falls evaluation completed    MEDICARE RISK AT HOME: Medicare Risk at Home Any stairs in or around the home?: No If so, are there any without handrails?: No Home free of loose throw rugs in walkways, pet beds,  electrical cords, etc?: Yes Adequate lighting in your home to reduce risk of falls?: Yes Life alert?: No Use of a cane, walker or w/c?: No Grab bars in the bathroom?: Yes Shower chair or bench in shower?: No Elevated toilet seat or a handicapped toilet?: Yes  TIMED UP AND GO:  Was the test performed?  No    Cognitive Function:    09/06/2023    1:00 PM 05/23/2023    5:00 PM 04/13/2017    3:46 PM  MMSE - Mini Mental State Exam  Orientation to time 2 5 5   Orientation to Place 5 4 5   Registration 3 3 3   Attention/ Calculation 5 5 5   Recall 0 2 3  Language- name 2 objects 2 2 2   Language- repeat 1 1 1   Language- follow 3 step command 3 3 3   Language- read & follow direction 1 1 1   Write a sentence 1 1 1   Copy design 0 0 1  Total score 23 27 30       05/13/2022   11:00 AM  Montreal Cognitive Assessment   Visuospatial/ Executive (0/5) 2  Naming (0/3) 0  Attention: Read list of digits (0/2) 2  Attention: Read list of letters (0/1) 1  Attention: Serial 7 subtraction starting at 100 (0/3) 0  Language: Repeat phrase (0/2) 2  Language : Fluency (0/1) 1  Abstraction (0/2) 0  Delayed Recall (0/5) 2  Orientation (0/6) 6  Total 16  Adjusted Score (based on education) 16      09/14/2023    2:17 PM 08/09/2022    1:18 PM 04/23/2020    4:19 PM  6CIT Screen  What Year? 0 points 0 points 0 points  What month? 3 points 0 points 0 points  What time? 0 points 0 points 0 points  Count back from 20 0 points 0 points 0 points  Months in reverse 4 points 0 points 0 points  Repeat phrase 0 points 0 points 0 points  Total Score 7 points 0 points 0 points    Immunizations Immunization History  Administered Date(s) Administered   Fluad Quad(high Dose 65+) 03/26/2019, 04/23/2020, 04/29/2021, 05/03/2022, 05/10/2023   Fluad Trivalent(High Dose 65+) 05/05/2023   Influenza Whole 05/02/2012, 05/02/2013   Influenza, High Dose Seasonal PF 04/11/2014, 04/12/2016, 04/13/2017  Influenza-Unspecified 04/27/2015, 05/12/2018   Moderna SARS-COV2 Booster Vaccination 06/17/2020, 12/18/2020   Moderna Sars-Covid-2 Vaccination 08/14/2019, 09/12/2019   Pneumococcal Conjugate-13 04/11/2015   Pneumococcal Polysaccharide-23 03/17/2011   Td 02/20/2008   Tdap 06/26/2019   Unspecified SARS-COV-2 Vaccination 08/14/2019, 09/12/2019, 06/17/2020, 12/18/2020, 06/02/2022   Zoster Recombinant(Shingrix) 09/08/2017, 11/24/2017   Zoster, Live 08/02/2010    TDAP status: Up to date -06/26/2019  Flu Vaccine status: Up to date - 05/10/23  Pneumococcal vaccine status: Up to date - 04/11/2015  Covid-19 vaccine status: Information provided on how to obtain vaccines.   Qualifies for Shingles Vaccine? Yes   Zostavax completed Yes   Shingrix Completed?: Yes  Screening Tests Health Maintenance  Topic Date Due   COVID-19 Vaccine (10 - 2024-25 season) 04/03/2023   Medicare Annual Wellness (AWV)  09/13/2024   DTaP/Tdap/Td (3 - Td or Tdap) 06/25/2029   Pneumonia Vaccine 72+ Years old  Completed   INFLUENZA VACCINE  Completed   DEXA SCAN  Completed   Hepatitis C Screening  Completed   Zoster Vaccines- Shingrix  Completed   HPV VACCINES  Aged Out   Colonoscopy  Discontinued    Health Maintenance  Health Maintenance Due  Topic Date Due   COVID-19 Vaccine (10 - 2024-25 season) 04/03/2023    Colorectal cancer screening: No longer required.   Mammogram Status: Completed 04/13/23 - repeat 1 year  Bone Density status: last 08/02/2010.  Ordered repeat DEXA scan with Mammogram  Additional Screening:  Hepatitis C Screening: does not qualify  Vision Screening: Recommended annual ophthalmology exams for early detection of glaucoma and other disorders of the eye. Is the patient up to date with their annual eye exam?  Yes  Who is the provider or what is the name of the office in which the patient attends annual eye exams? Dr Lottie Dawson If pt is not established with a provider, would they like to be  referred to a provider to establish care? No .   Dental Screening: Recommended annual dental exams for proper oral hygiene  Community Resource Referral / Chronic Care Management: CRR required this visit?  No   CCM required this visit?  No     Plan:     I have personally reviewed and noted the following in the patient's chart:   Medical and social history Use of alcohol, tobacco or illicit drugs  Current medications and supplements including opioid prescriptions. Patient is currently taking opioid prescriptions. Information provided to patient regarding non-opioid alternatives. Patient advised to discuss non-opioid treatment plan with their provider. Functional ability and status Nutritional status Physical activity Advanced directives List of other physicians Hospitalizations, surgeries, and ER visits in previous 12 months Vitals Screenings to include cognitive, depression, and falls Referrals and appointments  In addition, I have reviewed and discussed with patient certain preventive protocols, quality metrics, and best practice recommendations. A written personalized care plan for preventive services as well as general preventive health recommendations were provided to patient.     Darreld Mclean, CMA   09/14/2023   After Visit Summary: (MyChart) Due to this being a telephonic visit, the after visit summary with patients personalized plan was offered to patient via MyChart   Nurse Notes: Ordered a DEXA scan and Mammogram for 2025.

## 2023-09-16 ENCOUNTER — Telehealth: Payer: Self-pay | Admitting: Physician Assistant

## 2023-09-16 DIAGNOSIS — G458 Other transient cerebral ischemic attacks and related syndromes: Secondary | ICD-10-CM | POA: Diagnosis not present

## 2023-09-16 DIAGNOSIS — R41841 Cognitive communication deficit: Secondary | ICD-10-CM | POA: Diagnosis not present

## 2023-09-16 NOTE — Telephone Encounter (Signed)
Patient sister needs to speak to someone about the patient medication Rivastigmine. Patient was throwing up and had some belly pain

## 2023-09-16 NOTE — Telephone Encounter (Signed)
Will call us back on Monday with update, thanked me for calling.

## 2023-09-19 ENCOUNTER — Ambulatory Visit: Payer: Medicare Other | Admitting: Internal Medicine

## 2023-09-21 ENCOUNTER — Ambulatory Visit: Payer: Medicare Other | Admitting: Internal Medicine

## 2023-10-03 DIAGNOSIS — D2261 Melanocytic nevi of right upper limb, including shoulder: Secondary | ICD-10-CM | POA: Diagnosis not present

## 2023-10-03 DIAGNOSIS — I872 Venous insufficiency (chronic) (peripheral): Secondary | ICD-10-CM | POA: Diagnosis not present

## 2023-10-03 DIAGNOSIS — D225 Melanocytic nevi of trunk: Secondary | ICD-10-CM | POA: Diagnosis not present

## 2023-10-03 DIAGNOSIS — D2262 Melanocytic nevi of left upper limb, including shoulder: Secondary | ICD-10-CM | POA: Diagnosis not present

## 2023-10-03 DIAGNOSIS — I8312 Varicose veins of left lower extremity with inflammation: Secondary | ICD-10-CM | POA: Diagnosis not present

## 2023-10-03 DIAGNOSIS — I8311 Varicose veins of right lower extremity with inflammation: Secondary | ICD-10-CM | POA: Diagnosis not present

## 2023-10-03 DIAGNOSIS — L821 Other seborrheic keratosis: Secondary | ICD-10-CM | POA: Diagnosis not present

## 2023-10-04 ENCOUNTER — Ambulatory Visit: Payer: Medicare Other | Admitting: Internal Medicine

## 2023-10-04 ENCOUNTER — Encounter: Payer: Self-pay | Admitting: Internal Medicine

## 2023-10-04 VITALS — BP 124/80 | HR 72 | Temp 98.3°F | Ht 74.0 in | Wt 216.0 lb

## 2023-10-04 DIAGNOSIS — I2583 Coronary atherosclerosis due to lipid rich plaque: Secondary | ICD-10-CM

## 2023-10-04 DIAGNOSIS — R32 Unspecified urinary incontinence: Secondary | ICD-10-CM | POA: Diagnosis not present

## 2023-10-04 DIAGNOSIS — R413 Other amnesia: Secondary | ICD-10-CM

## 2023-10-04 DIAGNOSIS — R3915 Urgency of urination: Secondary | ICD-10-CM | POA: Diagnosis not present

## 2023-10-04 DIAGNOSIS — I2510 Atherosclerotic heart disease of native coronary artery without angina pectoris: Secondary | ICD-10-CM | POA: Diagnosis not present

## 2023-10-04 DIAGNOSIS — R739 Hyperglycemia, unspecified: Secondary | ICD-10-CM

## 2023-10-04 DIAGNOSIS — D751 Secondary polycythemia: Secondary | ICD-10-CM

## 2023-10-04 DIAGNOSIS — R202 Paresthesia of skin: Secondary | ICD-10-CM | POA: Diagnosis not present

## 2023-10-04 LAB — CBC WITH DIFFERENTIAL/PLATELET
Basophils Absolute: 0.1 10*3/uL (ref 0.0–0.1)
Basophils Relative: 0.7 % (ref 0.0–3.0)
Eosinophils Absolute: 0.1 10*3/uL (ref 0.0–0.7)
Eosinophils Relative: 1.8 % (ref 0.0–5.0)
HCT: 44.5 % (ref 36.0–46.0)
Hemoglobin: 14.9 g/dL (ref 12.0–15.0)
Lymphocytes Relative: 28 % (ref 12.0–46.0)
Lymphs Abs: 2.2 10*3/uL (ref 0.7–4.0)
MCHC: 33.5 g/dL (ref 30.0–36.0)
MCV: 91.7 fl (ref 78.0–100.0)
Monocytes Absolute: 0.9 10*3/uL (ref 0.1–1.0)
Monocytes Relative: 11.8 % (ref 3.0–12.0)
Neutro Abs: 4.6 10*3/uL (ref 1.4–7.7)
Neutrophils Relative %: 57.7 % (ref 43.0–77.0)
Platelets: 262 10*3/uL (ref 150.0–400.0)
RBC: 4.85 Mil/uL (ref 3.87–5.11)
RDW: 13.8 % (ref 11.5–15.5)
WBC: 7.9 10*3/uL (ref 4.0–10.5)

## 2023-10-04 LAB — COMPREHENSIVE METABOLIC PANEL
ALT: 11 U/L (ref 0–35)
AST: 15 U/L (ref 0–37)
Albumin: 4.3 g/dL (ref 3.5–5.2)
Alkaline Phosphatase: 53 U/L (ref 39–117)
BUN: 25 mg/dL — ABNORMAL HIGH (ref 6–23)
CO2: 30 meq/L (ref 19–32)
Calcium: 10 mg/dL (ref 8.4–10.5)
Chloride: 106 meq/L (ref 96–112)
Creatinine, Ser: 0.81 mg/dL (ref 0.40–1.20)
GFR: 69.76 mL/min (ref >60.00)
Glucose, Bld: 98 mg/dL (ref 70–99)
Potassium: 4.4 meq/L (ref 3.5–5.1)
Sodium: 142 meq/L (ref 135–145)
Total Bilirubin: 0.6 mg/dL (ref 0.2–1.2)
Total Protein: 7.3 g/dL (ref 6.0–8.3)

## 2023-10-04 LAB — HEMOGLOBIN A1C: Hgb A1c MFr Bld: 5.9 % (ref 4.6–6.5)

## 2023-10-04 LAB — TSH: TSH: 1.94 u[IU]/mL (ref 0.35–5.50)

## 2023-10-04 LAB — VITAMIN B12: Vitamin B-12: 274 pg/mL (ref 211–911)

## 2023-10-04 NOTE — Assessment & Plan Note (Signed)
 Moving to the Assisted Living at [facility]

## 2023-10-04 NOTE — Assessment & Plan Note (Signed)
 Chronic Using pads, depends

## 2023-10-04 NOTE — Assessment & Plan Note (Signed)
Cont on  Pravastatin

## 2023-10-04 NOTE — Assessment & Plan Note (Signed)
 Check A1c.

## 2023-10-04 NOTE — Assessment & Plan Note (Signed)
 Check CBC

## 2023-10-04 NOTE — Progress Notes (Signed)
 Subjective:  Patient ID: Morgan Huff, female    DOB: [birth year]  Age: 78 y.o. MRN: [id number]  CC: Medical Management of Chronic Issues   HPI [name] presents for memory loss Here w/sister Morgan Huff  Outpatient Medications Prior to Visit  Medication Sig Dispense Refill   Ascorbic Acid (VITAMIN C) 500 MG CAPS Take 500 mg by mouth daily.     aspirin 81 MG EC tablet Take 81 mg by mouth daily. Swallow whole.     Biotin 5 MG TABS Take 5,000 mg by mouth daily. Actually 5000 mcg -1 capsule daily     brimonidine (ALPHAGAN) 0.2 % ophthalmic solution SMARTSIG:In Eye(s)     brimonidine-timolol (COMBIGAN) 0.2-0.5 % ophthalmic solution Apply to eye.     calcium carbonate (TUMS - DOSED IN MG ELEMENTAL CALCIUM) 500 MG chewable tablet Chew 2 tablets by mouth daily.      cholecalciferol (VITAMIN D) 1000 units tablet Take 2 tablets (2,000 Units total) by mouth daily. 100 tablet 3   diclofenac Sodium (VOLTAREN) 1 % GEL Apply topically as needed.     ibuprofen (ADVIL) 200 MG tablet Take 200 mg by mouth every 6 (six) hours as needed.     ketoconazole (NIZORAL) 2 % cream Apply 1 Application topically daily. 45 g 1   ketoconazole (NIZORAL) 200 MG tablet Take 1 tablet (200 mg total) by mouth 2 (two) times daily. 14 tablet 0   latanoprost (XALATAN) 0.005 % ophthalmic solution Place 1 drop into the left eye at bedtime.     loratadine (CLARITIN) 10 MG tablet Take 1 tablet (10 mg total) by mouth daily. 100 tablet 3   Multiple Vitamins-Minerals (CENTRUM SILVER PO) Take 1 tablet by mouth daily.     Polyethyl Glycol-Propyl Glycol 0.4-0.3 % SOLN Place 1-2 drops into both eyes 3 (three) times daily as needed (for dry eyes).      polyethylene glycol powder (GLYCOLAX/MIRALAX) 17 GM/SCOOP powder Take 17 g by mouth daily. 500 g 5   pravastatin (PRAVACHOL) 10 MG tablet TAKE 1 TABLET BY MOUTH DAILY 90 tablet 3   rivastigmine (EXELON) 3 MG capsule Take 1 cap  twice a day 60 capsule 11   senna (SENOKOT) 8.6 MG  tablet Take 1-2 tablets (8.6-17.2 mg total) by mouth daily as needed for constipation. 100 tablet 3   timolol (TIMOPTIC) 0.5 % ophthalmic solution SMARTSIG:In Eye(s)     traMADol (ULTRAM) 50 MG tablet Take by mouth every 8 (eight) hours as needed.     glycopyrrolate (ROBINUL) 2 MG tablet Take 1 tablet (2 mg total) by mouth 3 (three) times daily as needed (abdominal cramps). (Patient not taking: Reported on 10/04/2023) 90 tablet 3   mirabegron ER (MYRBETRIQ) 50 MG TB24 tablet Take 1 tablet (50 mg total) by mouth daily. (Patient not taking: Reported on 10/04/2023) 30 tablet 11   No facility-administered medications prior to visit.    ROS: Review of Systems  Constitutional:  Positive for fatigue. Negative for activity change, appetite change, chills and unexpected weight change.  HENT:  Negative for congestion, mouth sores and sinus pressure.   Eyes:  Negative for visual disturbance.  Respiratory:  Negative for cough and chest tightness.   Gastrointestinal:  Negative for abdominal pain and nausea.  Genitourinary:  Negative for difficulty urinating, frequency and vaginal pain.  Musculoskeletal:  Positive for gait problem. Negative for back pain.  Skin:  Negative for pallor and rash.  Neurological:  Negative for dizziness, tremors, weakness, numbness and headaches.  Hematological:  Does not bruise/bleed easily.  Psychiatric/Behavioral:  Positive for confusion, decreased concentration and dysphoric mood. Negative for sleep disturbance and suicidal ideas. The patient is nervous/anxious.     Objective:  BP 124/80   Pulse 72   Temp 98.3 F (36.8 C) (Oral)   Ht 6\' 2"  (1.88 m)   Wt 216 lb (98 kg)   SpO2 94%   BMI 27.73 kg/m   BP Readings from Last 3 Encounters:  10/04/23 124/80  09/06/23 (!) 150/74  05/23/23 138/80    Wt Readings from Last 3 Encounters:  10/04/23 216 lb (98 kg)  09/14/23 226 lb (102.5 kg)  09/06/23 226 lb (102.5 kg)    Physical Exam Constitutional:      General: She  is not in acute distress.    Appearance: She is well-developed. She is obese.  HENT:     Head: Normocephalic.     Right Ear: External ear normal.     Left Ear: External ear normal.     Nose: Nose normal.  Eyes:     General:        Right eye: No discharge.        Left eye: No discharge.     Conjunctiva/sclera: Conjunctivae normal.     Pupils: Pupils are equal, round, and reactive to light.  Neck:     Thyroid: No thyromegaly.     Vascular: No JVD.     Trachea: No tracheal deviation.  Cardiovascular:     Rate and Rhythm: Normal rate and regular rhythm.     Heart sounds: Normal heart sounds.  Pulmonary:     Effort: No respiratory distress.     Breath sounds: No stridor. No wheezing.  Abdominal:     General: Bowel sounds are normal. There is no distension.     Palpations: Abdomen is soft. There is no mass.     Tenderness: There is no abdominal tenderness. There is no guarding or rebound.  Musculoskeletal:        General: No tenderness.     Cervical back: Normal range of motion and neck supple. No rigidity.     Right lower leg: No edema.     Left lower leg: No edema.  Lymphadenopathy:     Cervical: No cervical adenopathy.  Skin:    Findings: No erythema or rash.  Neurological:     Mental Status: Mental status is at baseline.     Cranial Nerves: No cranial nerve deficit.     Motor: No abnormal muscle tone.     Coordination: Coordination normal.     Gait: Gait abnormal.     Deep Tendon Reflexes: Reflexes normal.  Psychiatric:        Behavior: Behavior normal.        Thought Content: Thought content normal.        Judgment: Judgment normal.   Easily upset, anxious, forgetful  Lab Results  Component Value Date   WBC 8.1 02/10/2023   HGB 14.2 02/10/2023   HCT 43.6 02/10/2023   PLT 311.0 02/10/2023   GLUCOSE 100 (H) 05/16/2023   CHOL 169 01/28/2022   TRIG 127.0 01/28/2022   HDL 54.40 01/28/2022   LDLDIRECT 85.6 02/20/2008   LDLCALC 89 01/28/2022   ALT 14 05/16/2023    AST 16 05/16/2023   NA 142 05/16/2023   K 4.1 05/16/2023   CL 106 05/16/2023   CREATININE 0.80 05/16/2023   BUN 28 (H) 05/16/2023   CO2 29 05/16/2023   TSH  2.13 02/10/2023   INR 1.00 09/19/2018   HGBA1C 5.8 05/16/2023   MICROALBUR 0.7 04/12/2016    US Carotid Bilateral Result Date: 06/28/2022 CLINICAL DATA:  78 year old female with a history of stroke EXAM: BILATERAL CAROTID DUPLEX ULTRASOUND TECHNIQUE: Wallace Cullens scale imaging, color Doppler and duplex ultrasound were performed of bilateral carotid and vertebral arteries in the neck. COMPARISON:  None Available. FINDINGS: Criteria: Quantification of carotid stenosis is based on velocity parameters that correlate the residual internal carotid diameter with NASCET-based stenosis levels, using the diameter of the distal internal carotid lumen as the denominator for stenosis measurement. The following velocity measurements were obtained: RIGHT ICA:  Systolic 63 cm/sec, Diastolic 9 cm/sec CCA:  92 cm/sec SYSTOLIC ICA/CCA RATIO:  0.7 ECA:  60 cm/sec LEFT ICA:  Systolic 67 cm/sec, Diastolic 23 cm/sec CCA:  83 cm/sec SYSTOLIC ICA/CCA RATIO:  0.8 ECA:  55 cm/sec Right Brachial SBP: 191 Left Brachial SBP: 190 RIGHT CAROTID ARTERY: No significant calcified disease of the right common carotid artery. Intermediate waveform maintained. Homogeneous plaque without significant calcifications at the right carotid bifurcation. Low resistance waveform of the right ICA. No significant tortuosity. RIGHT VERTEBRAL ARTERY: Antegrade flow with low resistance waveform. LEFT CAROTID ARTERY: No significant calcified disease of the left common carotid artery. Intermediate waveform maintained. Homogeneous plaque at the left carotid bifurcation without significant calcifications. Low resistance waveform of the left ICA. LEFT VERTEBRAL ARTERY:  Antegrade flow with low resistance waveform. IMPRESSION: Color duplex indicates minimal homogeneous plaque, with no hemodynamically  significant stenosis by duplex criteria in the extracranial cerebrovascular circulation. Signed, Yvone Neu. Miachel Roux, RPVI [facility] Radiology Electronically Signed   By: Gilmer Mor D.O.   On: 06/28/2022 16:37    Assessment & Plan:   Problem List Items Addressed This Visit     Hyperglycemia   Check A1c      Relevant Orders   Hemoglobin A1c   RESOLVED: URINARY INCONTINENCE   Chronic Using pads, depends      Urinary urgency - Primary   Chronic Using pads, depends      Relevant Orders   CBC with Differential/Platelet   Comprehensive metabolic panel   Hemoglobin A1c   TSH   Vitamin B12   Polycythemia, secondary   Check CBC      Relevant Orders   CBC with Differential/Platelet   TSH   CAD (coronary artery disease)   Cont on Pravastatin      Relevant Orders   CBC with Differential/Platelet   TSH   Memory change   Moving to the Assisted Living at [facility]      Relevant Orders   Vitamin B12   Other Visit Diagnoses       Paresthesias       Relevant Orders   TSH   Vitamin B12         No orders of the defined types were placed in this encounter.     Follow-up: Return in about 3 months (around 01/04/2024) for a follow-up visit.  Sonda Primes, MD

## 2023-10-06 ENCOUNTER — Encounter: Payer: Self-pay | Admitting: Internal Medicine

## 2023-10-15 DIAGNOSIS — H401331 Pigmentary glaucoma, bilateral, mild stage: Secondary | ICD-10-CM | POA: Diagnosis not present

## 2023-10-21 ENCOUNTER — Telehealth: Payer: Self-pay | Admitting: Internal Medicine

## 2023-10-21 NOTE — Telephone Encounter (Signed)
 Copied from CRM [id number]. Topic: General - Other >> Oct 21, 2023 10:55 AM Alphonzo Lemmings O wrote: Reason for JYN:[id number] ciara the social worker calling to get a  fax number moving from our independent living area to our assisted living area and need to fax over some orders to provider. Ms Charlynne Cousins will fax over order and just can complete and fax back . Relayed fax number to Ms. Charlynne Cousins

## 2023-10-31 NOTE — Telephone Encounter (Signed)
 Copied from CRM [id number]. Topic: General - Other >> Oct 31, 2023  9:41 AM Truddie Crumble wrote: Reason for CRM: autumn from [facility] called stating the patient is moving into the facility and she need clarification on some orders  CB [phone]

## 2023-11-04 NOTE — Telephone Encounter (Signed)
 Received orders. Placed in folder for Dr.Plotnikov to fill out. Dr.Plotnikov currently out of the office and won't be back in until Monday

## 2023-11-22 ENCOUNTER — Ambulatory Visit: Admitting: Internal Medicine

## 2023-11-22 ENCOUNTER — Encounter: Payer: Self-pay | Admitting: Internal Medicine

## 2023-11-22 VITALS — BP 120/80 | HR 67 | Temp 98.2°F | Resp 21 | Ht 74.0 in | Wt 212.8 lb

## 2023-11-22 DIAGNOSIS — M545 Low back pain, unspecified: Secondary | ICD-10-CM | POA: Diagnosis not present

## 2023-11-22 DIAGNOSIS — I6381 Other cerebral infarction due to occlusion or stenosis of small artery: Secondary | ICD-10-CM | POA: Diagnosis not present

## 2023-11-22 DIAGNOSIS — R3915 Urgency of urination: Secondary | ICD-10-CM

## 2023-11-22 DIAGNOSIS — F03A3 Unspecified dementia, mild, with mood disturbance: Secondary | ICD-10-CM

## 2023-11-22 DIAGNOSIS — G8929 Other chronic pain: Secondary | ICD-10-CM

## 2023-11-22 DIAGNOSIS — R109 Unspecified abdominal pain: Secondary | ICD-10-CM

## 2023-11-22 DIAGNOSIS — E785 Hyperlipidemia, unspecified: Secondary | ICD-10-CM | POA: Diagnosis not present

## 2023-11-22 DIAGNOSIS — R739 Hyperglycemia, unspecified: Secondary | ICD-10-CM

## 2023-11-23 ENCOUNTER — Ambulatory Visit: Payer: Medicare Other | Admitting: Physician Assistant

## 2023-11-23 DIAGNOSIS — H401331 Pigmentary glaucoma, bilateral, mild stage: Secondary | ICD-10-CM | POA: Diagnosis not present

## 2023-11-24 ENCOUNTER — Encounter: Payer: Self-pay | Admitting: Physician Assistant

## 2023-11-24 DIAGNOSIS — R2681 Unsteadiness on feet: Secondary | ICD-10-CM | POA: Diagnosis not present

## 2023-11-24 DIAGNOSIS — M1611 Unilateral primary osteoarthritis, right hip: Secondary | ICD-10-CM | POA: Diagnosis not present

## 2023-11-26 DIAGNOSIS — M1611 Unilateral primary osteoarthritis, right hip: Secondary | ICD-10-CM | POA: Diagnosis not present

## 2023-11-26 DIAGNOSIS — R2681 Unsteadiness on feet: Secondary | ICD-10-CM | POA: Diagnosis not present

## 2023-11-27 NOTE — Progress Notes (Signed)
 Location:   [facility]   Place of Service: Clinic    Provider:   Code Status:  Goals of Care:     09/14/2023    2:04 PM  Advanced Directives  Does Patient Have a Medical Advance Directive? Yes  Type of Advance Directive Healthcare Power of Attorney  Copy of Healthcare Power of Attorney in Chart? No - copy requested     Chief Complaint  Patient presents with   New Patient (Initial Visit)    Patient has concerns about stomach pain. Pain in right hand all the way to the shoulder.     HPI: Patient is a 78 y.o. female seen today for medical management of chronic diseases.   Patient is a recent move [place] [facility] She came with her sister who lives in [place] Patient has never been married and has no kids  The history was obtained by reviewing the chart Patient has a history of cognitive impairment.  With MRI done in 23 shows atrophy and chronic lacunar infarct. Her recent MMSE at neurology was 23 out of 30 with decline in recall.  Her Exelon  was increased to 3 mg twice daily  Patient also has a history of hypertension, hyperlipidemia,, osteoarthritis And urinary incontinence History of hyperglycemia, CAD, HLD Recent diagnosis of pigmentary glaucoma of both eyes mild  Discussed the use of AI scribe software for clinical note transcription with the patient, who gave verbal consent to proceed.  History of Present Illness   . The patient reports that her stomach feels tight and sometimes feels like something is hammering on it. She also experiences difficulty with bowel movements, describing it as if there's a plug.  The patient also reports urinary frequency, which she attributes to an overactive bladder. Was on med which was stopped due to Cost  Recent Diagnosis of Glaucoma  and is currently on eye drops, which she believes may be causing her vision to become blurry. The patient is scheduled to see her ophthalmologist to discuss this issue.   Her main issue is that she  is not happy about moving to [place] and think her sisters made her move Feels staff is trying to control her stuff  Repeats herself      Past Medical History:  Diagnosis Date   Abdominal cramps 08/03/2022   Chronic abdominal cramps - periodic. Worse. Treat constipation.  (Pt saw Dr Lavaughn Portland, had an MRI).   Allergic rhinitis 08/03/2022   Chronic runny nose - Atrovent  did not work  Start Loratidine 10 mg/d   Arthritis    Atrophic vaginitis 04/29/2021   9/22  Dr Asencion Blacksmith, dr Valeta Gaudier  On estrocream vag 3/wk PV   Bilateral leg weakness 04/06/2013   Present since 2011-2012. She must push up from a chair   CAD (coronary artery disease) 04/29/2021   2021 CT coronary calcium score of 302.   Cont w/Simvastatin  - d/c, ASA  Cont on Pravastatin    Constipation 08/03/2022   Chronic abdominal cramps - periodic. Worse.  (Pt saw Dr Lavaughn Portland, had an MRI).   Diverticulosis of colon 10/02/2010   Dyslipidemia 02/20/2008   Simvastatin      NMR Lipoprofile 2005: LDL particle # 1776/ small dense 1106 on Lipitor 20 mg daily.   MGF MI in 44s; father MI @ 61.  No FH CVA  NMR 05/2011:LDL 78 (1169/542), HDL 41, TG 135. LDL goal < 100, ideally < 70.     [facility] panel: Hyperabsorber of  dietary cholesterol; Lp(a) 112; CRP  2.7     12/19 cardiac CT scan for calcium scoring offered     Cont on Pravastatin       Dysrhythmia    palpitations recently   Elevated blood pressure reading without diagnosis of hypertension 04/10/2010   Generalized anxiety disorder 07/29/2021   12/22  Discussed. Pt declined meds, declined ref to psychology, psychiatry... Try Valerian root for anxiety   Glaucoma 03/17/2011   Dr Leanor Proper, [facility]   Groin pain 03/26/2019   8/20 R - ?MSK vs other  R/o hernia  Gen surg ref vs CT discussed  06/2019 CT ok - Dr Lavaughn Portland  MSK strain   Heart murmur    History of colonic polyps 03/25/2009       Colonoscopy 2009: Adenomatous polyps, Dr Lavaughn Portland.   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   Pigmentary glaucoma of both eyes, mild stage 04/09/2013   Plantar fasciitis, left 06/18/2009   Polycythemia, secondary 04/12/2016   Mild 2016   Pre-diabetes    per patient   Pseudophakia 06/23/2012   TIA (transient ischemic attack) 07/08/2019   12/20 probable-global aphasia, confusion  global aphasia, confusion - no relapse  Doppler (-) B  Card ECHO ordered   Urinary urgency 10/02/2010   Chronic  Dr Asencion Blacksmith, Dr McDiarmid d/c. Seeing Dr Valeta Gaudier  ?OAB  Trial of prn Ditropan  2022  Ditropan  po  Try estrocream vag 3/wk PV  On Macrobid qd  10/22 Memory loss is worse.  Likely aggravated by daily use of Ditropan .  She can switch to as needed use.  12/22 on Estrocream and Myrbetriq   Discussed anxiety issues. Pt declined meds, declined ref to psychology, psychiatry...  Try Valerian root for anxie   Varicose veins of lower extremities 07/13/2010    Past Surgical History:  Procedure Laterality Date   ABDOMINAL HYSTERECTOMY     ANTERIOR AND POSTERIOR REPAIR WITH SACROSPINOUS FIXATION N/A 06/05/2015   Procedure: ANTERIOR AND POSTERIOR REPAIR WITH SACROSPINOUS LIGAMENT SUSPENSION;  Surgeon: Belle Box, MD;  Location: [facility];  Service: Gynecology;  Laterality: N/A;   CATARACT EXTRACTION, BILATERAL     lens implant   CHOLECYSTECTOMY     CHOLECYSTECTOMY, LAPAROSCOPIC     Dr Linell Rhymes   COLONOSCOPY  2015   neg; due 2020   colonoscopy with polypectomy     Dr Lavaughn Portland; X 3   EYE SURGERY     trabeculectomy ;Dr Francisco Irving.Cataract removal OD; Dr Shaunna Delaware   TOTAL HIP ARTHROPLASTY Right 09/22/2018    Procedure: TOTAL HIP ARTHROPLASTY ANTERIOR APPROACH;  Surgeon: Neil Balls, MD;  Location: [facility];  Service: Orthopedics;  Laterality: Right;    Allergies  Allergen Reactions   Donepezil      diarrhea   Namenda  [Memantine ]     headache   Latex Rash    Red rash around red bandaid    Outpatient Encounter Medications as of 11/22/2023  Medication Sig   Ascorbic Acid (VITAMIN C)  500 MG CAPS Take 500 mg by mouth daily.   aspirin  81 MG EC tablet Take 81 mg by mouth daily. Swallow whole.   Biotin 5 MG TABS Take 5,000 mg by mouth daily. Actually 5000 mcg -1 capsule daily   brimonidine (ALPHAGAN) 0.2 % ophthalmic solution SMARTSIG:In Eye(s)   brimonidine-timolol  (COMBIGAN) 0.2-0.5 % ophthalmic solution Apply to eye.   calcium carbonate (TUMS - DOSED IN MG ELEMENTAL CALCIUM) 500 MG chewable tablet Chew 2 tablets by mouth daily.    cholecalciferol (VITAMIN D ) 1000 units tablet Take 2 tablets (2,000 Units total) by mouth daily.   diclofenac Sodium (VOLTAREN) 1 % GEL Apply topically as needed.   glycopyrrolate  (ROBINUL ) 2 MG tablet Take 1 tablet (2 mg total) by mouth 3 (three) times daily as needed (abdominal cramps).   ibuprofen  (ADVIL ) 200 MG tablet Take 200 mg by mouth every 6 (six) hours as needed.   ketoconazole  (NIZORAL ) 2 % cream Apply 1 Application topically daily.   ketoconazole  (NIZORAL ) 200 MG tablet Take 1 tablet (200 mg total) by mouth 2 (two) times daily.   latanoprost (XALATAN) 0.005 % ophthalmic solution Place 1 drop into the left eye at bedtime.   loratadine  (CLARITIN ) 10 MG tablet Take 1 tablet (10 mg total) by mouth daily.   mirabegron  ER (MYRBETRIQ ) 50 MG TB24 tablet Take 1 tablet (50 mg total) by mouth daily.   Multiple Vitamins-Minerals (CENTRUM SILVER  PO) Take 1 tablet by mouth daily.   Polyethyl Glycol-Propyl Glycol 0.4-0.3 % SOLN Place 1-2 drops into both eyes 3 (three) times daily as needed (for dry eyes).    polyethylene glycol powder (GLYCOLAX /MIRALAX ) 17 GM/SCOOP powder  Take 17 g by mouth daily.   pravastatin  (PRAVACHOL ) 10 MG tablet TAKE 1 TABLET BY MOUTH DAILY   rivastigmine  (EXELON ) 3 MG capsule Take 1 cap  twice a day   senna (SENOKOT) 8.6 MG tablet Take 1-2 tablets (8.6-17.2 mg total) by mouth daily as needed for constipation.   timolol  (TIMOPTIC ) 0.5 % ophthalmic solution SMARTSIG:In Eye(s)   traMADol  (ULTRAM ) 50 MG tablet Take by mouth every 8 (eight) hours as needed.   No facility-administered encounter medications on file as of 11/22/2023.    Review of Systems:  Review of Systems  Constitutional:  Negative for activity change and appetite change.  HENT: Negative.    Respiratory:  Negative for cough and shortness of breath.   Cardiovascular:  Negative for leg swelling.  Gastrointestinal:  Positive for constipation.  Genitourinary:  Positive for urgency.  Musculoskeletal:  Negative for arthralgias, gait problem and myalgias.  Skin: Negative.   Neurological:  Negative for dizziness and weakness.  Psychiatric/Behavioral:  Positive for confusion. Negative for dysphoric mood and sleep disturbance.     Health Maintenance  Topic Date Due   COVID-19 Vaccine (10 - 2024-25 season) 05/23/2024 (Originally 04/03/2023)   INFLUENZA VACCINE  03/02/2024   Medicare Annual Wellness (AWV)  09/13/2024   DTaP/Tdap/Td (3 - Td or Tdap) 06/25/2029   Pneumonia Vaccine 35+ Years old  Completed   DEXA SCAN  Completed   Hepatitis C Screening  Completed   Zoster Vaccines- Shingrix  Completed   HPV VACCINES  Aged Out   Meningococcal B Vaccine  Aged Out   Colonoscopy  Discontinued    Physical Exam: Vitals:   11/22/23 1521  BP: 120/80  Pulse: 67  Resp: (!) 21  Temp: 98.2 F (36.8 C)  Weight: 212 lb 12.8 oz (96.5 kg)  Height: 6\' 2"  (1.88 m)  PF: 98  L/min   Body mass index is 27.32 kg/m. Physical Exam Vitals reviewed.  Constitutional:      Appearance: Normal appearance.  HENT:     Head: Normocephalic.     Nose: Nose normal.     Mouth/Throat:      Mouth: Mucous membranes are moist.     Pharynx: Oropharynx is clear.  Eyes:     Pupils: Pupils are equal, round, and reactive to light.  Cardiovascular:     Rate and Rhythm: Normal rate and regular rhythm.     Pulses: Normal pulses.     Heart sounds: Normal heart sounds. No murmur heard. Pulmonary:     Effort: Pulmonary effort is normal.     Breath sounds: Normal breath sounds.  Abdominal:     General: Abdomen is flat. Bowel sounds are normal.     Palpations: Abdomen is soft.  Musculoskeletal:        General: No swelling.     Cervical back: Neck supple.  Skin:    General: Skin is warm.  Neurological:     General: No focal deficit present.     Mental Status: She is alert.  Psychiatric:        Mood and Affect: Mood normal.        Thought Content: Thought content normal.     Labs reviewed: Basic Metabolic Panel: Recent Labs    02/10/23 1628 05/16/23 1408 10/04/23 1612  NA 141 142 142  K 4.3 4.1 4.4  CL 106 106 106  CO2 29 29 30   GLUCOSE 106* 100* 98  BUN 31* 28* 25*  CREATININE 0.84 0.80 0.81  CALCIUM 10.2 10.1 10.0  TSH 2.13  --  1.94   Liver Function Tests: Recent Labs    02/10/23 1628 05/16/23 1408 10/04/23 1612  AST 18 16 15   ALT 13 14 11   ALKPHOS 55 50 53  BILITOT 0.7 0.8 0.6  PROT 7.1 6.9 7.3  ALBUMIN 4.2 4.1 4.3   No results for input(s): "LIPASE", "AMYLASE" in the last 8760 hours. No results for input(s): "AMMONIA" in the last 8760 hours. CBC: Recent Labs    02/10/23 1628 10/04/23 1612  WBC 8.1 7.9  NEUTROABS 4.9 4.6  HGB 14.2 14.9  HCT 43.6 44.5  MCV 91.7 91.7  PLT 311.0 262.0   Lipid Panel: No results for input(s): "CHOL", "HDL", "LDLCALC", "TRIG", "CHOLHDL", "LDLDIRECT" in the last 8760 hours. Lab Results  Component Value Date   HGBA1C 5.9 10/04/2023    Procedures since last visit: No results found.  Assessment/Plan Assessment and Plan 1. Mild dementia with mood disturbance, unspecified dementia type (HCC) (Primary) On  Exelon  Probably consider adding Namenda  to help with behaviors Appropriate for AL 2. Hyperglycemia A1C 6.4 3. Urinary urgency Was on Myrbeteriq stopped due to cost  4. Multiple lacunar infarcts (HCC) On MRI On Aspirin  and statin  5. Dyslipidemia Statin  LDL 88  6. Chronic low back pain, unspecified back pain laterality, unspecified whether sciatica present Takes tramadol  PRn  7. Abdominal cramps On Robinul  PRN Also takes Miralax      8 Glaucoma, Overactive bladder 9 Vitamin B12 started     Labs/tests ordered:  * No order type specified * Next appt:  02/27/2024

## 2023-11-28 ENCOUNTER — Encounter: Payer: Self-pay | Admitting: Internal Medicine

## 2023-11-28 NOTE — Telephone Encounter (Signed)
Message routed to Dr.Gupta

## 2023-11-29 DIAGNOSIS — R2681 Unsteadiness on feet: Secondary | ICD-10-CM | POA: Diagnosis not present

## 2023-11-29 DIAGNOSIS — M1611 Unilateral primary osteoarthritis, right hip: Secondary | ICD-10-CM | POA: Diagnosis not present

## 2023-12-01 ENCOUNTER — Other Ambulatory Visit: Payer: Self-pay | Admitting: Gastroenterology

## 2023-12-01 DIAGNOSIS — K5730 Diverticulosis of large intestine without perforation or abscess without bleeding: Secondary | ICD-10-CM

## 2023-12-01 DIAGNOSIS — R1031 Right lower quadrant pain: Secondary | ICD-10-CM

## 2023-12-02 DIAGNOSIS — R2681 Unsteadiness on feet: Secondary | ICD-10-CM | POA: Diagnosis not present

## 2023-12-02 DIAGNOSIS — M1611 Unilateral primary osteoarthritis, right hip: Secondary | ICD-10-CM | POA: Diagnosis not present

## 2023-12-06 DIAGNOSIS — R2681 Unsteadiness on feet: Secondary | ICD-10-CM | POA: Diagnosis not present

## 2023-12-06 DIAGNOSIS — M1611 Unilateral primary osteoarthritis, right hip: Secondary | ICD-10-CM | POA: Diagnosis not present

## 2023-12-08 DIAGNOSIS — M1611 Unilateral primary osteoarthritis, right hip: Secondary | ICD-10-CM | POA: Diagnosis not present

## 2023-12-08 DIAGNOSIS — R2681 Unsteadiness on feet: Secondary | ICD-10-CM | POA: Diagnosis not present

## 2023-12-13 DIAGNOSIS — R2681 Unsteadiness on feet: Secondary | ICD-10-CM | POA: Diagnosis not present

## 2023-12-13 DIAGNOSIS — M1611 Unilateral primary osteoarthritis, right hip: Secondary | ICD-10-CM | POA: Diagnosis not present

## 2023-12-15 ENCOUNTER — Ambulatory Visit
Admission: RE | Admit: 2023-12-15 | Discharge: 2023-12-15 | Disposition: A | Discharge status: Home / Self Care | Source: Ambulatory Visit | Attending: Gastroenterology | Admitting: Gastroenterology

## 2023-12-15 DIAGNOSIS — K5730 Diverticulosis of large intestine without perforation or abscess without bleeding: Secondary | ICD-10-CM | POA: Diagnosis not present

## 2023-12-15 DIAGNOSIS — R1031 Right lower quadrant pain: Secondary | ICD-10-CM

## 2023-12-15 DIAGNOSIS — R109 Unspecified abdominal pain: Secondary | ICD-10-CM | POA: Diagnosis not present

## 2023-12-15 MED ORDER — IOPAMIDOL (ISOVUE-370) INJECTION 76%
80.0000 mL | Freq: Once | INTRAVENOUS | Status: AC | PRN
  Administered 2023-12-15: 80 mL via INTRAVENOUS

## 2023-12-20 DIAGNOSIS — M1611 Unilateral primary osteoarthritis, right hip: Secondary | ICD-10-CM | POA: Diagnosis not present

## 2023-12-20 DIAGNOSIS — R2681 Unsteadiness on feet: Secondary | ICD-10-CM | POA: Diagnosis not present

## 2023-12-21 ENCOUNTER — Encounter: Payer: Self-pay | Admitting: Physician Assistant

## 2023-12-22 DIAGNOSIS — M1611 Unilateral primary osteoarthritis, right hip: Secondary | ICD-10-CM | POA: Diagnosis not present

## 2023-12-22 DIAGNOSIS — R2681 Unsteadiness on feet: Secondary | ICD-10-CM | POA: Diagnosis not present

## 2023-12-28 ENCOUNTER — Ambulatory Visit: Admitting: Physician Assistant

## 2023-12-28 ENCOUNTER — Encounter: Payer: Self-pay | Admitting: Internal Medicine

## 2023-12-28 ENCOUNTER — Encounter: Payer: Self-pay | Admitting: Physician Assistant

## 2023-12-28 VITALS — BP 144/85 | HR 76 | Resp 20 | Wt 215.0 lb

## 2023-12-28 DIAGNOSIS — F03918 Unspecified dementia, unspecified severity, with other behavioral disturbance: Secondary | ICD-10-CM | POA: Insufficient documentation

## 2023-12-28 DIAGNOSIS — F0280 Dementia in other diseases classified elsewhere without behavioral disturbance: Secondary | ICD-10-CM

## 2023-12-28 DIAGNOSIS — G309 Alzheimer's disease, unspecified: Secondary | ICD-10-CM

## 2023-12-28 MED ORDER — RIVASTIGMINE TARTRATE 4.5 MG PO CAPS: ORAL_CAPSULE | ORAL | 11 refills | Status: DC

## 2023-12-28 NOTE — Progress Notes (Signed)
 Assessment/Plan:   Demential likely due to Alzheimer's disease   Morgan Huff is a very pleasant 78 y.o. RH female with a history of hypertension, hyperlipidemia, osteoarthritis, and a diagnosis of MCI with memory loss of unclear etiology per neuropsych evaluation in 2023 seen today in follow up for memory loss. Patient is currently on rivastigmine  3 mg twice daily (she has had side effects with donepezil  or memantine ). Cognitive decline is noted with MMSE today at 16/30, thus, transitioning to dementia. Discussed increasing the dose to 4.5 mg bid, she agrees to proceed. Will consider starting Depakote 125 mg at night for sundowning and hallucinations after rivastigmine  has started.   Follow up in 6  months. Continue rivastigmine  3 mg twice daily, side effects discussed Recommend good control of her cardiovascular risk factors Continue to control mood as per PCP     Subjective:    This patient is accompanied in the office by her sister who supplements the history.  Previous records as well as any outside records available were reviewed prior to todays visit. Patient was last seen on 09/06/2023 with MMSE 23/30   Any changes in memory since last visit? " About the same"-sister disagrees, feels that it is worse than before with both, short and long term memory.  She is now in assisted living.  She likes drawing by number, does not like doing any brain simulating exercises.  She enjoys watching TV or using the computer. repeats oneself?  Endorsed Disoriented when walking into a room? Denies    Leaving objects?  May misplace things but not in unusual places   Wandering behavior?  denies   Any personality changes since last visit?  "She is anxious and angry most of the time, easily frustrated"-sister says. Any worsening depression?:  Denies.   Hallucinations or paranoia?  Sister reports that she sees people in the living room watching TV, never speaking to her chest "left through the  other door " (there is no other door). Seizures? denies    Any sleep changes?  Sleeps well.  Denies vivid dreams, REM behavior or sleepwalking   Sleep apnea?   Denies.   Any hygiene concerns? Endorsed.  Independent of bathing and dressing?  Endorsed   Does the patient needs help with medications?  Facility  is in charge   Who is in charge of the finances?  Patient is in charge     Any changes in appetite?  denies     Patient have trouble swallowing? Denies.   Does the patient cook? No Any headaches?   denies   Any vision changes?  She has a history of pigmentary glaucoma followed by PCP, to see ophthalmology soon  Chronic back pain  denies   Ambulates with difficulty? Denies.    Recent falls or head injuries? Denies.     Unilateral weakness, numbness or tingling? Denies.   Any tremors?  Denies   Any anosmia?  Denies   Any incontinence of urine?  She is a history of OAB   Any bowel dysfunction?   Denies      Patient lives at [facility], moved to assisted living 10/31/2023, trying to adjust to new environment  Does the patient drive? No longer drives     Initial Visit 05/13/22  How long did patient have memory difficulties?  "I don't until my sister was mentioning ". She reports decreased concentration.  "In the middle of a story I forget what I was talking about ". Learning names of  people is harder. LTM is normal according to her. repeats oneself? Denies  Disoriented when walking into a room?  Patient denies   Leaving objects in unusual places?  Patient denies   Patient lives  [place] lives alone Ambulates  with difficulty?   Patient has a history of arthritis, "bad he ", which may limit her ability to ambulate. Recent falls?  Patient denies   Any head injuries?  Patient denies   History of seizures?   Patient denies   Wandering behavior?  Patient denies   Patient drives?  "I get a little more cautions finding a familiar road".  She reports that has been living in [place]  her life, and there is so much construction, but at times it may become more difficult, but she denies being disoriented. Any mood changes ? She denies, but her sister states that "She wakes up in the middle of the night to take a shower, there is distortion with the concept of time".  Patient denies, stating if she is already, and she has to be somewhere, "why not to take a shower?  " Any depression?:  Patient denies. She has anxiety, undergoing "a lot of stress".  She states that is she is a  [employer] and that contributes to situational stress, although she enjoys working. Hallucinations?  Patient denies   Paranoia?  Patient denies   Patient reports that sleeps well without vivid dreams, REM behavior or sleepwalking    History of sleep apnea?  Patient denies   Any hygiene concerns?  Patient denies   Independent of bathing and dressing?  Endorsed  Does the patient needs help with medications? Patient in charge, denies forgetting any doses Who is in charge of the finances? Patient  is in charge   Any changes in appetite?  Patient denies   Patient have trouble swallowing? Patient denies   Does the patient cook?  Patient denies   Any kitchen accidents such as leaving the stove on? Patient denies   Any headaches?  Patient denies   Double vision? Patient denies   Any focal numbness or tingling?  Patient denies   Chronic back pain Patient denies   Unilateral weakness?  Patient denies   Any tremors?  Patient denies   Any history of anosmia?  Patient denies   Any incontinence of urine?  She has a history of OAB on Myrbetriq . Uses a pad "just in case Any bowel dysfunction?   History of constipation  History of heavy alcohol intake?  Patient denies   History of heavy tobacco use?  Patient denies   Family history of dementia?   Denies Pertinent labs: TSH 2.6, A1C 6.1 nl CBC and CMP, B12 478  Carotid ultrasound 06/21/2022 with minimal homogeneous plaque, without significant stenosis.     Neuropsychological evaluation 11/11/2022 Briefly, results suggested primary impairments surrounding processing speed, cognitive flexibility, verbal fluency, confrontation naming, and all aspects of verbal learning and memory. The etiology of ongoing cognitive impairment is unclear at the present time. It sounds that family and her medical providers have concerns surrounding underlying Alzheimer's disease. I do feel that these concerns are reasonable and Ms. Kern should continue to be monitored over time. Across memory testing, Ms. Salamone did not benefit from repeated exposure to novel information. While she retained about 80% of a previously read story, this percentage only amounted to her recollection of four items. Retention rates were 0% across a list of words and 38% across a previously drawn complex figure, suggesting  some evolving evidence for rapid forgetting. Across verbal memory measures, she also performed very poorly across yes/no recognition trials. 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 Further impairments surrounding semantic fluency and confrontation naming would also follow typical disease trajectory. The fact that she was able to demonstrate some normatively appropriate visual memory performances is encouraging. If Alzheimer's disease is present, it would remain in somewhat early stages at the present time.    PREVIOUS MEDICATIONS:   CURRENT MEDICATIONS:  Outpatient Encounter Medications as of 12/28/2023  Medication Sig   Ascorbic Acid (VITAMIN C) 500 MG CAPS Take 500 mg by mouth daily.   aspirin  81 MG EC tablet Take 81 mg by mouth daily. Swallow whole.   Biotin 5 MG TABS Take 5,000 mg by mouth daily. Actually 5000 mcg -1 capsule daily   brimonidine (ALPHAGAN) 0.2 % ophthalmic solution SMARTSIG:In Eye(s)   brimonidine-timolol  (COMBIGAN)  0.2-0.5 % ophthalmic solution Apply to eye.   calcium carbonate (TUMS - DOSED IN MG ELEMENTAL CALCIUM) 500 MG chewable tablet Chew 2 tablets by mouth daily.    cholecalciferol (VITAMIN D ) 1000 units tablet Take 2 tablets (2,000 Units total) by mouth daily.   diclofenac Sodium (VOLTAREN) 1 % GEL Apply topically as needed.   glycopyrrolate  (ROBINUL ) 2 MG tablet Take 1 tablet (2 mg total) by mouth 3 (three) times daily as needed (abdominal cramps).   ibuprofen  (ADVIL ) 200 MG tablet Take 200 mg by mouth every 6 (six) hours as needed.   ketoconazole  (NIZORAL ) 2 % cream Apply 1 Application topically daily.   ketoconazole  (NIZORAL ) 200 MG tablet Take 1 tablet (200 mg total) by mouth 2 (two) times daily.   latanoprost (XALATAN) 0.005 % ophthalmic solution Place 1 drop into the left eye at bedtime.   loratadine  (CLARITIN ) 10 MG tablet Take 1 tablet (10 mg total) by mouth daily.   mirabegron  ER (MYRBETRIQ ) 50 MG TB24 tablet Take 1 tablet (50 mg total) by mouth daily. (Patient not taking: Reported on 11/27/2023)   Multiple Vitamins-Minerals (CENTRUM SILVER  PO) Take 1 tablet by mouth daily.   Polyethyl Glycol-Propyl Glycol 0.4-0.3 % SOLN Place 1-2 drops into both eyes 3 (three) times daily as needed (for dry eyes).    polyethylene glycol powder (GLYCOLAX /MIRALAX ) 17 GM/SCOOP powder Take 17 g by mouth daily.   pravastatin  (PRAVACHOL ) 10 MG tablet TAKE 1 TABLET BY MOUTH DAILY   rivastigmine  (EXELON ) 4.5 MG capsule Take 1 cap  twice a day   senna (SENOKOT) 8.6 MG tablet Take 1-2 tablets (8.6-17.2 mg total) by mouth daily as needed for constipation.   timolol  (TIMOPTIC ) 0.5 % ophthalmic solution SMARTSIG:In Eye(s)   traMADol  (ULTRAM ) 50 MG tablet Take by mouth every 8 (eight) hours as needed.   [DISCONTINUED] rivastigmine  (EXELON ) 3 MG capsule Take 1 cap  twice a day   [DISCONTINUED] rivastigmine  (EXELON ) 4.5 MG capsule Take 1 cap  twice a day   No facility-administered encounter medications on file as of  12/28/2023.       12/28/2023    5:00 PM 09/06/2023    1:00 PM 05/23/2023    5:00 PM  MMSE - Mini Mental State Exam  Orientation to time 1 2 5   Orientation to Place 1 5 4   Registration 3 3 3   Attention/ Calculation 3 5 5   Recall 0 0 2  Language- name 2 objects 2 2 2   Language- repeat 1  1 1  Language- follow 3 step command 3 3 3   Language- read & follow direction 1 1 1   Write a sentence 1 1 1   Copy design 0 0 0  Total score 16 23 27       05/13/2022   11:00 AM  Montreal Cognitive Assessment   Visuospatial/ Executive (0/5) 2  Naming (0/3) 0  Attention: Read list of digits (0/2) 2  Attention: Read list of letters (0/1) 1  Attention: Serial 7 subtraction starting at 100 (0/3) 0  Language: Repeat phrase (0/2) 2  Language : Fluency (0/1) 1  Abstraction (0/2) 0  Delayed Recall (0/5) 2  Orientation (0/6) 6  Total 16  Adjusted Score (based on education) 16    Objective:     PHYSICAL EXAMINATION:    VITALS:   Vitals:   12/28/23 1544  BP: (!) 144/85  Pulse: 76  Resp: 20  SpO2: 97%  Weight: 215 lb (97.5 kg)    GEN:  The patient appears stated age and is in NAD. HEENT:  Normocephalic, atraumatic.   Neurological examination:  General: NAD, well-groomed, appears stated age. Orientation: The patient is alert. Oriented to person, not to place or date Cranial nerves: There is good facial symmetry. Anxious appearing. The speech is fluent and clear, more tangential than prior. No aphasia or dysarthria. Fund of knowledge is reduced. Recent and remote memory are impaired. Attention and concentration are reduced. Able to name objects and repeat phrases.  Hearing is intact to conversational tone.   Sensation: Sensation is intact to light touch throughout Motor: Strength is at least antigravity x4. DTR's 2/4 in UE/LE     Movement examination: Tone: There is normal tone in the UE/LE Abnormal movements:  no tremor.  No myoclonus.  No asterixis.   Coordination:  There is no  decremation with RAM's. Normal finger to nose  Gait and Station: The patient has no  difficulty arising out of a deep-seated chair without the use of the hands. The patient's stride length is good.  Gait is cautious and narrow.    Thank you for allowing us  the opportunity to participate in the care of this nice patient. Please do not hesitate to contact us  for any questions or concerns.   Total time spent on today's visit was 20 minutes dedicated to this patient today, preparing to see patient, examining the patient, ordering tests and/or medications and counseling the patient, documenting clinical information in the EHR or other health record, independently interpreting results and communicating results to the patient/family, discussing treatment and goals, answering patient's questions and coordinating care.  Cc:  Plotnikov, Oakley Bellman, MD  Tex Filbert 12/28/2023 5:12 PM

## 2023-12-28 NOTE — Patient Instructions (Signed)
 It was a pleasure to see you today at our office.   Recommendations:  Follow up in  6 months  Rivastigmine  4.5  mg  twice a day    Whom to call:  Memory  decline, memory medications: Call our office [phone]   For psychiatric meds, mood meds: Please have your primary care physician manage these medications.   For assessment of decision of mental capacity and competency:  Call Dr. Laverne Potter, geriatric psychiatrist at [phone]  For guidance in geriatric dementia issues please call [facility] [phone]   If you have any severe symptoms of a stroke, or other severe issues such as confusion,severe chills or fever, etc call 911 or go to the ER as you may need to be evaluated further       RECOMMENDATIONS FOR ALL PATIENTS WITH MEMORY PROBLEMS: 1. Continue to exercise (Recommend 30 minutes of walking everyday, or 3 hours every week) 2. Increase social interactions - continue going to [place] and enjoy social gatherings with friends and family 3. Eat healthy, avoid fried foods and eat more fruits and vegetables 4. Maintain adequate blood pressure, blood sugar, and blood cholesterol level. Reducing the risk of stroke and cardiovascular disease also helps promoting better memory. 5. Avoid stressful situations. Live a simple life and avoid aggravations. Organize your time and prepare for the next day in anticipation. 6. Sleep well, avoid any interruptions of sleep and avoid any distractions in the bedroom that may interfere with adequate sleep quality 7. Avoid sugar, avoid sweets as there is a strong link between excessive sugar intake, diabetes, and cognitive impairment We discussed the Mediterranean diet, which has been shown to help patients reduce the risk of progressive memory disorders and reduces cardiovascular risk. This includes eating fish, eat fruits and green leafy vegetables, nuts like almonds and hazelnuts, walnuts, and also use olive oil. Avoid fast  foods and fried foods as much as possible. Avoid sweets and sugar as sugar use has been linked to worsening of memory function.  There is always a concern of gradual progression of memory problems. If this is the case, then we may need to adjust level of care according to patient needs. Support, both to the patient and caregiver, should then be put into place.    FALL PRECAUTIONS: Be cautious when walking. Scan the area for obstacles that may increase the risk of trips and falls. When getting up in the mornings, sit up at the edge of the bed for a few minutes before getting out of bed. Consider elevating the bed at the head end to avoid drop of blood pressure when getting up. Walk always in a well-lit room (use night lights in the walls). Avoid area rugs or power cords from appliances in the middle of the walkways. Use a walker or a cane if necessary and consider physical therapy for balance exercise. Get your eyesight checked regularly.  FINANCIAL OVERSIGHT: Supervision, especially oversight when making financial decisions or transactions is also recommended.  HOME SAFETY: Consider the safety of the kitchen when operating appliances like stoves, microwave oven, and blender. Consider having supervision and share cooking responsibilities until no longer able to participate in those. Accidents with firearms and other hazards in the house should be identified and addressed as well.   ABILITY TO BE LEFT ALONE: If patient is unable to contact 911 operator, consider using Life[facility], or when the need is there, arrange for someone to stay with patients. Smoking is a fire hazard, consider  supervision or cessation. Risk of wandering should be assessed by caregiver and if detected at any point, supervision and safe proof recommendations should be instituted.  MEDICATION SUPERVISION: Inability to self-administer medication needs to be constantly addressed. Implement a mechanism to ensure safe administration of the  medications.   DRIVING: Regarding driving, in patients with progressive memory problems, driving will be impaired. We advise to have someone else do the driving if trouble finding directions or if minor accidents are reported. Independent driving assessment is available to determine safety of driving.   If you are interested in the driving assessment, you can contact the following:  [facility] [employer] [facility] in [place] [phone]  [facility] [phone]  [facility] [phone]  [facility] [phone] or [phone]

## 2023-12-29 ENCOUNTER — Telehealth: Payer: Self-pay | Admitting: Adult Health

## 2023-12-29 DIAGNOSIS — M1611 Unilateral primary osteoarthritis, right hip: Secondary | ICD-10-CM | POA: Diagnosis not present

## 2023-12-29 DIAGNOSIS — R2681 Unsteadiness on feet: Secondary | ICD-10-CM | POA: Diagnosis not present

## 2023-12-29 MED ORDER — DIVALPROEX SODIUM 125 MG PO DR TAB: 125.0000 mg | DELAYED_RELEASE_TABLET | Freq: Two times a day (BID) | ORAL | Status: DC

## 2023-12-29 NOTE — Telephone Encounter (Signed)
 Communication reviewed from Neurology recommending trial of Depakote for behavioral/mood concerns Order written with f/u labs CBC CMP in one month

## 2024-01-02 ENCOUNTER — Telehealth: Payer: Self-pay | Admitting: Internal Medicine

## 2024-01-02 NOTE — Telephone Encounter (Signed)
 Copied from CRM [id number]. Topic: Clinical - Medical Advice >> Jan 02, 2024  9:17 AM Shelby Dessert H wrote: Reason for CRM: Patient sister Rice Chamorro called and wanted to know should the patient have her colonoscopy due to her medical issues (dementia), Rice Chamorro can be called back at [phone]. She is wanting to know is it worth the prep and sudation.

## 2024-01-03 DIAGNOSIS — M1611 Unilateral primary osteoarthritis, right hip: Secondary | ICD-10-CM | POA: Diagnosis not present

## 2024-01-03 DIAGNOSIS — R2681 Unsteadiness on feet: Secondary | ICD-10-CM | POA: Diagnosis not present

## 2024-01-03 NOTE — Telephone Encounter (Signed)
 Attempted to reach pts sister to inform her of the following per Provider has stated "I do not recommend a screening colonoscopy. Vivica should only have a colonoscopy for medical reasons after a consultation w/GI doctors.  Thx"

## 2024-01-03 NOTE — Telephone Encounter (Signed)
 I do not recommend a screening colonoscopy. Morgan Huff should only have a colonoscopy for medical reasons after a consultation w/GI doctors.  Thx.

## 2024-01-04 ENCOUNTER — Ambulatory Visit: Admitting: Internal Medicine

## 2024-01-07 DIAGNOSIS — M1611 Unilateral primary osteoarthritis, right hip: Secondary | ICD-10-CM | POA: Diagnosis not present

## 2024-01-07 DIAGNOSIS — R2681 Unsteadiness on feet: Secondary | ICD-10-CM | POA: Diagnosis not present

## 2024-01-10 DIAGNOSIS — R2681 Unsteadiness on feet: Secondary | ICD-10-CM | POA: Diagnosis not present

## 2024-01-10 DIAGNOSIS — M1611 Unilateral primary osteoarthritis, right hip: Secondary | ICD-10-CM | POA: Diagnosis not present

## 2024-01-11 DIAGNOSIS — R1031 Right lower quadrant pain: Secondary | ICD-10-CM | POA: Diagnosis not present

## 2024-01-12 DIAGNOSIS — R2681 Unsteadiness on feet: Secondary | ICD-10-CM | POA: Diagnosis not present

## 2024-01-12 DIAGNOSIS — M1611 Unilateral primary osteoarthritis, right hip: Secondary | ICD-10-CM | POA: Diagnosis not present

## 2024-01-16 DIAGNOSIS — R2681 Unsteadiness on feet: Secondary | ICD-10-CM | POA: Diagnosis not present

## 2024-01-16 DIAGNOSIS — M1611 Unilateral primary osteoarthritis, right hip: Secondary | ICD-10-CM | POA: Diagnosis not present

## 2024-01-16 DIAGNOSIS — H401331 Pigmentary glaucoma, bilateral, mild stage: Secondary | ICD-10-CM | POA: Diagnosis not present

## 2024-01-17 ENCOUNTER — Ambulatory Visit: Payer: Medicare Other | Admitting: Physician Assistant

## 2024-01-19 DIAGNOSIS — R2681 Unsteadiness on feet: Secondary | ICD-10-CM | POA: Diagnosis not present

## 2024-01-19 DIAGNOSIS — M1611 Unilateral primary osteoarthritis, right hip: Secondary | ICD-10-CM | POA: Diagnosis not present

## 2024-01-20 ENCOUNTER — Ambulatory Visit: Payer: Self-pay

## 2024-01-20 NOTE — Telephone Encounter (Signed)
 FYI Only or Action Required?: Action required by provider: clinical question for provider and update on patient condition.  Patient was last seen in primary care on 11/22/2023 by Marguerite Shiley, MD. Called Nurse Triage reporting Advice Only, Altered Mental Status, and Abdominal Pain. Symptoms began n/a. Interventions attempted: Prescription medications:  Aaron Aas Symptoms are: gradually worsening.  Triage Disposition: Call PCP When Office is Open  Patient/caregiver understands and will follow disposition?: Yes  Copied from CRM [id number]. Topic: Clinical - Medication Question >> Jan 20, 2024  8:39 AM Karole Pacer C wrote: Reason for CRM: PAtient has been experiencing more confussion. PAtients sister is wondering if the following medication: divalproex  (DEPAKOTE ) 125 MG DR tablet should be lowered or patient can be placed on an anxiety medication. Patient's sister Rice Chamorro can be called back if any additional questions. [phone]  Patient is also having more stomach pain. PAtient complains of bloating. Patient's sistes also thinks this medication causing rivastigmine  (EXELON ) 4.5 MG capsule Reason for Disposition  [1] Caller requesting NON-URGENT health information AND [2] PCP's office is the best resource  Answer Assessment - Initial Assessment Questions 1. REASON FOR CALL or QUESTION: What is your reason for calling today? or How can I best help you? or What question do you have that I can help answer?     Patient's sister, Rice Chamorro, reports that patient's confusion is causing her to feel more agitated and anxious.  Questions if raising the depakote  dosage or adding an anxiety medication help to mitigate these symptoms?  Reports that patient continues to have stomach bloating and does not feel like she is completely emptying her bowels.  Denies constipation.  Although she had these symptoms before starting the Exelon , asks if  Exelon   could be contributing.  Additionally, she has  seen no improvement in her  symptoms since increasing the dosage of Exelon   Reports that in the past, they have used senekot, miralax , glycopyrolate without relief.  Protocols used: Information Only Call - No Triage-A-AH

## 2024-01-22 NOTE — Telephone Encounter (Signed)
 Depakote  was prescribed by Tawni America, NP.  They need to check with her, please.  Thank you

## 2024-01-24 DIAGNOSIS — B351 Tinea unguium: Secondary | ICD-10-CM | POA: Diagnosis not present

## 2024-01-24 DIAGNOSIS — M1611 Unilateral primary osteoarthritis, right hip: Secondary | ICD-10-CM | POA: Diagnosis not present

## 2024-01-24 DIAGNOSIS — R2681 Unsteadiness on feet: Secondary | ICD-10-CM | POA: Diagnosis not present

## 2024-01-24 DIAGNOSIS — M2041 Other hammer toe(s) (acquired), right foot: Secondary | ICD-10-CM | POA: Diagnosis not present

## 2024-01-24 DIAGNOSIS — M2042 Other hammer toe(s) (acquired), left foot: Secondary | ICD-10-CM | POA: Diagnosis not present

## 2024-01-24 DIAGNOSIS — L84 Corns and callosities: Secondary | ICD-10-CM | POA: Diagnosis not present

## 2024-01-26 DIAGNOSIS — R2681 Unsteadiness on feet: Secondary | ICD-10-CM | POA: Diagnosis not present

## 2024-01-26 DIAGNOSIS — M1611 Unilateral primary osteoarthritis, right hip: Secondary | ICD-10-CM | POA: Diagnosis not present

## 2024-01-26 DIAGNOSIS — Z79899 Other long term (current) drug therapy: Secondary | ICD-10-CM | POA: Diagnosis not present

## 2024-01-26 LAB — HEPATIC FUNCTION PANEL
ALT: 12 U/L (ref 7–35)
AST: 20 (ref 13–35)
Alkaline Phosphatase: 63 (ref 25–125)
Bilirubin, Total: 0.4

## 2024-01-26 LAB — CBC AND DIFFERENTIAL
HCT: 43 (ref 36–46)
Hemoglobin: 14.1 (ref 12.0–16.0)
Platelets: 262 K/uL (ref 150–400)
WBC: 6.4

## 2024-01-26 LAB — BASIC METABOLIC PANEL WITH GFR
BUN: 19 (ref 4–21)
CO2: 25 — AB (ref 13–22)
Chloride: 106 (ref 99–108)
Creatinine: 0.8 (ref 0.5–1.1)
Glucose: 90
Potassium: 4.2 meq/L (ref 3.5–5.1)
Sodium: 144 (ref 137–147)

## 2024-01-26 LAB — COMPREHENSIVE METABOLIC PANEL WITH GFR
Albumin: 4.1 (ref 3.5–5.0)
Calcium: 10.1 (ref 8.7–10.7)
Globulin: 2.2
eGFR: 79

## 2024-01-26 LAB — CBC: RBC: 4.75 (ref 3.87–5.11)

## 2024-01-31 DIAGNOSIS — M1611 Unilateral primary osteoarthritis, right hip: Secondary | ICD-10-CM | POA: Diagnosis not present

## 2024-01-31 DIAGNOSIS — R2681 Unsteadiness on feet: Secondary | ICD-10-CM | POA: Diagnosis not present

## 2024-02-01 ENCOUNTER — Ambulatory Visit: Admitting: Internal Medicine

## 2024-02-02 DIAGNOSIS — M1611 Unilateral primary osteoarthritis, right hip: Secondary | ICD-10-CM | POA: Diagnosis not present

## 2024-02-02 DIAGNOSIS — R2681 Unsteadiness on feet: Secondary | ICD-10-CM | POA: Diagnosis not present

## 2024-02-07 ENCOUNTER — Non-Acute Institutional Stay: Admitting: Internal Medicine

## 2024-02-07 ENCOUNTER — Encounter: Payer: Self-pay | Admitting: Internal Medicine

## 2024-02-07 VITALS — BP 136/78 | HR 63 | Temp 97.9°F | Ht 74.0 in | Wt 212.2 lb

## 2024-02-07 DIAGNOSIS — R3915 Urgency of urination: Secondary | ICD-10-CM | POA: Diagnosis not present

## 2024-02-07 DIAGNOSIS — F02B18 Dementia in other diseases classified elsewhere, moderate, with other behavioral disturbance: Secondary | ICD-10-CM | POA: Diagnosis not present

## 2024-02-07 DIAGNOSIS — G301 Alzheimer's disease with late onset: Secondary | ICD-10-CM

## 2024-02-07 DIAGNOSIS — R1031 Right lower quadrant pain: Secondary | ICD-10-CM | POA: Diagnosis not present

## 2024-02-07 MED ORDER — DICYCLOMINE HCL 10 MG PO CAPS: 10.0000 mg | ORAL_CAPSULE | Freq: Every morning | ORAL | Status: AC

## 2024-02-07 MED ORDER — SERTRALINE HCL 25 MG PO TABS: 25.0000 mg | ORAL_TABLET | Freq: Every day | ORAL | Status: DC

## 2024-02-07 NOTE — Progress Notes (Addendum)
 Location: [facility] features editor of Service:  Clinic (12)  Provider:   Code Status:  Goals of Care:     12/28/2023    3:44 PM  Advanced Directives  Does Patient Have a Medical Advance Directive? Yes  Type of Advance Directive Healthcare Power of Attorney  Does patient want to make changes to medical advance directive? No - Patient declined  Copy of Healthcare Power of Attorney in Chart? No - copy requested     Chief Complaint  Patient presents with   Pain in abdomen    Pain in abdomen with tightness. Patient states that pain comes and goes and has been going on for around 20 years. Certain foods cause flair ups. Frequent urges to have a bowel movement.    HPI: Patient is a 78 y.o. female seen today for an acute visit for Right lower quadrant pain and Behaviors per staff  [place] a recent move [place]  her sister who lives in [place] is [place] Patient has never been married and has no kids  Acute issues Dementia with Behaviors Patient has a history of cognitive impairment.  With MRI done in 23 shows atrophy and chronic lacunar infarct. Her recent MMSE at neurology office was 16/30 which is a steep Decline Per sister her behaviors are better since been on Depakote  but she still concern about depression with mood disorders Also seems to be paranoid Adjusted to [place]  Right lower quadrant pain Has been chronic CT Scan negative Dr Rosalie does not think she needs more aggressive work up Pain only occurs in the morning No Nausea or vomiting No Diarrhea Does not wake her up at night Wt Readings from Last 3 Encounters:  02/07/24 212 lb 3.2 oz (96.3 kg)  12/28/23 215 lb (97.5 kg)  11/22/23 212 lb 12.8 oz (96.5 kg)       Past Medical History:  Diagnosis Date   Abdominal cramps 08/03/2022   Chronic abdominal cramps - periodic. Worse. Treat constipation.  (Pt saw Dr Rosalie, had an MRI).   Allergic rhinitis 08/03/2022   Chronic runny nose -  Atrovent  did not work  Start Loratidine 10 mg/d   Arthritis    Atrophic vaginitis 04/29/2021   9/22  Dr Marget, dr Elisabeth  On estrocream vag 3/wk PV   Bilateral leg weakness 04/06/2013   Present since 2011-2012. She must push up from a chair   CAD (coronary artery disease) 04/29/2021   2021 CT coronary calcium score of 302.   Cont w/Simvastatin  - d/c, ASA  Cont on Pravastatin    Constipation 08/03/2022   Chronic abdominal cramps - periodic. Worse.  (Pt saw Dr Rosalie, had an MRI).   Diverticulosis of colon 10/02/2010   Dyslipidemia 02/20/2008   Simvastatin      NMR Lipoprofile 2005: LDL particle # 1776/ small dense 1106 on Lipitor 20 mg daily.   MGF MI in 34s; father MI @ 53.  No FH CVA  NMR 05/2011:LDL 78 (1169/542), HDL 41, TG 135. LDL goal < 100, ideally < 70.     [facility] panel: Hyperabsorber of  dietary cholesterol; Lp(a) 112; CRP 2.7     12/19 cardiac CT scan for calcium scoring offered     Cont on Pravastatin       Dysrhythmia    palpitations recently   Elevated blood pressure reading without diagnosis of hypertension 04/10/2010   Generalized anxiety disorder 07/29/2021   12/22  Discussed. Pt declined meds, declined ref to  psychology, psychiatry... Try Valerian root for anxiety   Glaucoma 03/17/2011   Dr Geneva, [facility] Ophth   Groin pain 03/26/2019   8/20 R - ?[facility] vs other  R/o hernia  Gen surg ref vs CT discussed  06/2019 CT ok - Dr Rosalie  MSK strain   Heart murmur    History of colonic polyps 03/25/2009       Colonoscopy 2009: Adenomatous polyps, Dr Rosalie.   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   Pigmentary glaucoma of both eyes, mild stage 04/09/2013   Plantar fasciitis, left 06/18/2009   Polycythemia, secondary 04/12/2016   Mild 2016   Pre-diabetes    per patient   Pseudophakia 06/23/2012   TIA (transient ischemic attack) 07/08/2019   12/20 probable-global aphasia, confusion  global aphasia, confusion - no relapse  Doppler (-) B  Card ECHO ordered   Urinary urgency 10/02/2010   Chronic  Dr Marget, Dr McDiarmid d/c. Seeing Dr Elisabeth  ?OAB  Trial of prn Ditropan  2022  Ditropan  po  Try estrocream vag 3/wk PV  On Macrobid qd  10/22 Memory loss is worse.  Likely aggravated by daily use of Ditropan .  She can switch to as needed use.  12/22 on Estrocream and Myrbetriq   Discussed anxiety issues. Pt declined meds, declined ref to psychology, psychiatry...  Try Valerian root for anxie   Varicose veins of lower extremities 07/13/2010    Past Surgical History:  Procedure Laterality Date   ABDOMINAL HYSTERECTOMY     ANTERIOR AND POSTERIOR REPAIR WITH SACROSPINOUS FIXATION N/A 06/05/2015   Procedure: ANTERIOR AND POSTERIOR REPAIR WITH SACROSPINOUS LIGAMENT SUSPENSION;  Surgeon: Alm Marget, MD;  Location: [facility];  Service: Gynecology;  Laterality: N/A;   CATARACT EXTRACTION, BILATERAL     lens implant   CHOLECYSTECTOMY     CHOLECYSTECTOMY, LAPAROSCOPIC     Dr Merrilyn   COLONOSCOPY  2015   neg; due 2020   colonoscopy with polypectomy     Dr Rosalie; X 3   EYE SURGERY     trabeculectomy ;Dr Marci.Cataract removal OD; Dr Eliza   TOTAL HIP ARTHROPLASTY Right 09/22/2018   Procedure: TOTAL HIP ARTHROPLASTY ANTERIOR APPROACH;  Surgeon: Yvone Rush, MD;  Location: [facility];  Service: Orthopedics;  Laterality: Right;    Allergies  Allergen Reactions   Donepezil      diarrhea   Namenda  [Memantine ]     headache   Latex Rash    Red rash around red bandaid    Outpatient Encounter Medications as of 02/07/2024  Medication Sig    acetaminophen  (TYLENOL ) 500 MG tablet Take 1,000 mg by mouth every 12 (twelve) hours as needed.   Ascorbic Acid (VITAMIN C) 500 MG CAPS Take 500 mg by mouth daily.   aspirin  81 MG EC tablet Take 81 mg by mouth daily. Swallow whole.   Biotin 5 MG TABS Take 5,000 mg by mouth daily. Actually 5000 mcg -1 capsule  daily   brimonidine-timolol  (COMBIGAN) 0.2-0.5 % ophthalmic solution Apply to eye.   calcium carbonate (TUMS - DOSED IN MG ELEMENTAL CALCIUM) 500 MG chewable tablet Chew 2 tablets by mouth daily.    cholecalciferol (VITAMIN D ) 1000 units tablet Take 2 tablets (2,000 Units total) by mouth daily.   cyanocobalamin  (VITAMIN B12) 1000 MCG tablet Take 1,000 mcg by mouth daily.   diclofenac Sodium (VOLTAREN) 1 % GEL Apply topically as needed.   divalproex  (DEPAKOTE ) 125 MG DR tablet Take 1 tablet (125 mg total) by mouth 2 (two) times daily.   fluticasone (FLONASE) 50 MCG/ACT nasal spray Place 1 spray into both nostrils every 12 (twelve) hours as needed for allergies or rhinitis.   glycopyrrolate  (ROBINUL ) 2 MG tablet Take 1 tablet (2 mg total) by mouth 3 (three) times daily as needed (abdominal cramps).   ibuprofen  (ADVIL ) 200 MG tablet Take 200 mg by mouth every 6 (six) hours as needed.   latanoprost (XALATAN) 0.005 % ophthalmic solution Place 1 drop into the left eye at bedtime.   loratadine  (CLARITIN ) 10 MG tablet Take 1 tablet (10 mg total) by mouth daily.   Multiple Vitamins-Minerals (CENTRUM SILVER  PO) Take 1 tablet by mouth daily.   Polyethyl Glycol-Propyl Glycol 0.4-0.3 % SOLN Place 1-2 drops into both eyes 3 (three) times daily as needed (for dry eyes).    polyethylene glycol powder (GLYCOLAX /MIRALAX ) 17 GM/SCOOP powder Take 17 g by mouth daily.   pravastatin  (PRAVACHOL ) 10 MG tablet TAKE 1 TABLET BY MOUTH DAILY   rivastigmine  (EXELON ) 4.5 MG capsule Take 1 cap  twice a day   senna (SENOKOT) 8.6 MG tablet Take 1-2 tablets (8.6-17.2 mg total) by mouth daily as needed for constipation.    traMADol  (ULTRAM ) 50 MG tablet Take by mouth every 8 (eight) hours as needed.   brimonidine (ALPHAGAN) 0.2 % ophthalmic solution SMARTSIG:In Eye(s) (Patient not taking: Reported on 02/06/2024)   ketoconazole  (NIZORAL ) 2 % cream Apply 1 Application topically daily. (Patient not taking: Reported on 02/06/2024)   ketoconazole  (NIZORAL ) 200 MG tablet Take 1 tablet (200 mg total) by mouth 2 (two) times daily. (Patient not taking: Reported on 02/06/2024)   mirabegron  ER (MYRBETRIQ ) 50 MG TB24 tablet Take 1 tablet (50 mg total) by mouth daily. (Patient not taking: Reported on 02/07/2024)   timolol  (TIMOPTIC ) 0.5 % ophthalmic solution SMARTSIG:In Eye(s) (Patient not taking: Reported on 02/06/2024)   No facility-administered encounter medications on file as of 02/07/2024.    Review of Systems:  Review of Systems  Constitutional:  Negative for activity change and appetite change.  HENT: Negative.    Respiratory:  Negative for cough and shortness of breath.   Cardiovascular:  Negative for leg swelling.  Gastrointestinal:  Positive for abdominal pain. Negative for constipation.  Genitourinary: Negative.   Musculoskeletal:  Negative for arthralgias, gait problem and myalgias.  Skin: Negative.   Neurological:  Negative for dizziness and weakness.  Psychiatric/Behavioral:  Positive for behavioral problems, confusion and dysphoric mood. Negative for sleep disturbance.     Health Maintenance  Topic Date Due   COVID-19 Vaccine (10 - 2024-25 season) 05/23/2024 (Originally 04/03/2023)   INFLUENZA VACCINE  03/02/2024   Medicare Annual Wellness (AWV)  09/13/2024   DTaP/Tdap/Td (3 - Td or Tdap) 06/25/2029   Pneumococcal Vaccine: 50+ Years  Completed   DEXA SCAN  Completed   Hepatitis C Screening  Completed   Zoster Vaccines- Shingrix  Completed   Hepatitis B Vaccines  Aged Out   HPV VACCINES  Aged Out   Meningococcal B Vaccine  Aged Out   Colonoscopy  Discontinued    Physical Exam: Vitals:   02/07/24 0954  BP:  136/78  Pulse: 63  Temp: 97.9 F (36.6 C)  SpO2: 95%  Weight: 212 lb 3.2 oz (96.3 kg)  Height: 6' 2 (1.88 m)   Body mass index is 27.24 kg/m. Physical Exam Vitals reviewed.  Constitutional:      Appearance: Normal appearance.  HENT:     Head: Normocephalic.     Nose: Nose normal.     Mouth/Throat:     Mouth: Mucous membranes are moist.     Pharynx: Oropharynx is clear.  Eyes:     Pupils: Pupils are equal, round, and reactive to light.  Cardiovascular:     Rate and Rhythm: Normal rate and regular rhythm.     Pulses: Normal pulses.     Heart sounds: Normal heart sounds. No murmur heard. Pulmonary:     Effort: Pulmonary effort is normal.     Breath sounds: Normal breath sounds.  Abdominal:     General: Abdomen is flat. Bowel sounds are normal. There is no distension.     Palpations: Abdomen is soft. There is no mass.     Tenderness: There is no abdominal tenderness. There is no guarding.  Musculoskeletal:     Cervical back: Neck supple.     Comments: Chronic Venous changes in both Legs with Mild edema  Skin:    General: Skin is warm.  Neurological:     General: No focal deficit present.     Mental Status: She is alert.  Psychiatric:        Mood and Affect: Mood normal.        Thought Content: Thought content normal.     Labs reviewed: Basic Metabolic Panel: Recent Labs    02/10/23 1628 05/16/23 1408 10/04/23 1612 01/26/24 0000  NA 141 142 142 144  K 4.3 4.1 4.4 4.2  CL 106 106 106 106  CO2 29 29 30  25*  GLUCOSE 106* 100* 98  --   BUN 31* 28* 25* 19  CREATININE 0.84 0.80 0.81 0.8  CALCIUM 10.2 10.1 10.0 10.1  TSH 2.13  --  1.94  --    Liver Function Tests: Recent Labs    02/10/23 1628 05/16/23 1408 10/04/23 1612 01/26/24 0000  AST 18 16 15 20   ALT 13 14 11 12   ALKPHOS 55 50 53 63  BILITOT 0.7 0.8 0.6  --   PROT 7.1 6.9 7.3  --   ALBUMIN 4.2 4.1 4.3 4.1   No results for input(s): LIPASE, AMYLASE in the last 8760 hours. No results for  input(s): AMMONIA in the last 8760 hours. CBC: Recent Labs    02/10/23 1628 10/04/23 1612 01/26/24 0000  WBC 8.1 7.9 6.4  NEUTROABS 4.9 4.6  --   HGB 14.2 14.9 14.1  HCT 43.6 44.5 43  MCV 91.7 91.7  --   PLT 311.0 262.0 262   Lipid Panel: No results for input(s): CHOL, HDL, LDLCALC, TRIG, CHOLHDL, LDLDIRECT in the last 8760 hours. Lab Results  Component Value Date   HGBA1C 5.9 10/04/2023    Procedures since last visit: No results found.  Assessment/Plan 1. Right lower quadrant abdominal pain (Primary) Only in the morning With no associated symptoms D/W the Sister Agree to just treat symptoms right now CT in 5/25 was negative Per Dr Rosalie more work up not needed Will Try Tylenol  650 mg with  Bentyl  10 mg in the morning  Also discontinue Robinul  for now as not helping   2. 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

## 2024-02-08 DIAGNOSIS — R2681 Unsteadiness on feet: Secondary | ICD-10-CM | POA: Diagnosis not present

## 2024-02-08 DIAGNOSIS — M1611 Unilateral primary osteoarthritis, right hip: Secondary | ICD-10-CM | POA: Diagnosis not present

## 2024-02-09 DIAGNOSIS — M1611 Unilateral primary osteoarthritis, right hip: Secondary | ICD-10-CM | POA: Diagnosis not present

## 2024-02-09 DIAGNOSIS — R2681 Unsteadiness on feet: Secondary | ICD-10-CM | POA: Diagnosis not present

## 2024-02-14 ENCOUNTER — Non-Acute Institutional Stay: Payer: Self-pay | Admitting: Orthopedic Surgery

## 2024-02-14 ENCOUNTER — Encounter: Payer: Self-pay | Admitting: Orthopedic Surgery

## 2024-02-14 DIAGNOSIS — R3915 Urgency of urination: Secondary | ICD-10-CM

## 2024-02-14 DIAGNOSIS — G301 Alzheimer's disease with late onset: Secondary | ICD-10-CM

## 2024-02-14 DIAGNOSIS — F02B18 Dementia in other diseases classified elsewhere, moderate, with other behavioral disturbance: Secondary | ICD-10-CM | POA: Diagnosis not present

## 2024-02-14 NOTE — Progress Notes (Unsigned)
 [facility] Room Number: 615-A Place of Service:  [phone]) Provider:  Greig Cluster, NP  PCP: Charlanne Fredia CROME, MD  Patient Care Team: Charlanne Fredia CROME, MD as PCP - General (Internal Medicine) Rosalie Kitchens, MD as Consulting Physician (Gastroenterology) Marget Lenis, MD as Consulting Physician (Obstetrics and Gynecology) Harvey Seltzer, MD as Consulting Physician (Sports Medicine) Yvone Rush, MD as Consulting Physician (Orthopedic Surgery) Gaston Hamilton, MD as Consulting Physician (Urology) Elisabeth Valli BIRCH, MD as Consulting Physician (Urology) Dina Camie FORBES DEVONNA (Neurology) Bond, Reyes Mcardle, MD as Referring Physician (Ophthalmology)  Extended Emergency Contact Information Primary Emergency Contact: [facility] [place] Home Phone: [phone] Mobile Phone: [phone] Relation: Other Secondary Emergency Contact: Diehl,Carol Address: [address]          [place], [place] [address] [place] Home Phone: [phone] Mobile Phone: [phone] Relation: Sister  Code Status:   Goals of care: Advanced Directive information    02/14/2024    3:51 PM  Advanced Directives  Does Patient Have a Medical Advance Directive? Yes  Type of Advance Directive Living will;Healthcare Power of Attorney  Does patient want to make changes to medical advance directive? No - Patient declined  Copy of Healthcare Power of Attorney in Chart? Yes - validated most recent copy scanned in chart (See row information)     Chief Complaint  Patient presents with   Urinary Frequency    HPI:  Pt is a 78 y.o. female seen today for an acute visit for    Past Medical History:  Diagnosis Date   Abdominal cramps 08/03/2022   Chronic abdominal cramps - periodic. Worse. Treat constipation.  (Pt saw Dr Rosalie, had an MRI).   Allergic rhinitis 08/03/2022   Chronic runny nose - Atrovent  did not work  Start Loratidine 10  mg/d   Arthritis    Atrophic vaginitis 04/29/2021   9/22  Dr Marget, dr Elisabeth  On estrocream vag 3/wk PV   Bilateral leg weakness 04/06/2013   Present since 2011-2012. She must push up from a chair   CAD (coronary artery disease) 04/29/2021   2021 CT coronary calcium score of 302.   Cont w/Simvastatin  - d/c, ASA  Cont on Pravastatin    Constipation 08/03/2022   Chronic abdominal cramps - periodic. Worse.  (Pt saw Dr Rosalie, had an MRI).   Diverticulosis of colon 10/02/2010   Dyslipidemia 02/20/2008   Simvastatin      NMR Lipoprofile 2005: LDL particle # 1776/ small dense 1106 on Lipitor 20 mg daily.   MGF MI in 12s; father MI @ 82.  No FH CVA  NMR 05/2011:LDL 78 (1169/542), HDL 41, TG 135. LDL goal < 100, ideally < 70.     [facility] panel: Hyperabsorber of  dietary cholesterol; Lp(a) 112; CRP 2.7     12/19 cardiac CT scan for calcium scoring offered     Cont on Pravastatin       Dysrhythmia    palpitations recently   Elevated blood pressure reading without diagnosis of hypertension 04/10/2010   Generalized anxiety disorder 07/29/2021   12/22  Discussed. Pt declined meds, declined ref to psychology, psychiatry... Try Valerian root for anxiety   Glaucoma 03/17/2011   Dr Geneva, [facility]   Groin pain 03/26/2019   8/20 R - ?[facility] vs other  R/o hernia  Gen surg ref vs CT discussed  06/2019 CT ok - Dr Rosalie  MSK strain   Heart murmur  History of colonic polyps 03/25/2009       Colonoscopy 2009: Adenomatous polyps, Dr Rosalie.   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   Pigmentary glaucoma of both eyes, mild stage 04/09/2013   Plantar fasciitis, left 06/18/2009   Polycythemia, secondary 04/12/2016   Mild 2016   Pre-diabetes    per patient   Pseudophakia 06/23/2012   TIA (transient ischemic attack) 07/08/2019   12/20 probable-global aphasia, confusion  global aphasia, confusion - no relapse  Doppler (-) B  Card ECHO ordered   Urinary urgency 10/02/2010   Chronic  Dr Marget, Dr McDiarmid d/c. Seeing Dr Elisabeth  ?OAB  Trial of prn Ditropan  2022  Ditropan  po  Try estrocream vag 3/wk PV  On Macrobid qd  10/22 Memory loss is worse.  Likely aggravated by daily use of Ditropan .  She can switch to as needed use.  12/22 on Estrocream and Myrbetriq   Discussed anxiety issues. Pt declined meds, declined ref to psychology, psychiatry...  Try Valerian root for anxie   Varicose veins of lower extremities 07/13/2010   Past Surgical History:  Procedure Laterality Date   ABDOMINAL HYSTERECTOMY     ANTERIOR AND POSTERIOR REPAIR WITH SACROSPINOUS FIXATION N/A 06/05/2015   Procedure: ANTERIOR AND POSTERIOR REPAIR WITH SACROSPINOUS LIGAMENT SUSPENSION;  Surgeon: Alm Marget, MD;  Location: [facility];  Service: Gynecology;  Laterality: N/A;   CATARACT EXTRACTION, BILATERAL     lens implant   CHOLECYSTECTOMY     CHOLECYSTECTOMY, LAPAROSCOPIC     Dr Merrilyn   COLONOSCOPY  2015   neg; due 2020   colonoscopy with polypectomy     Dr Rosalie; X 3   EYE SURGERY     trabeculectomy ;Dr Marci.Cataract removal OD; Dr Eliza   TOTAL HIP ARTHROPLASTY Right 09/22/2018   Procedure: TOTAL HIP ARTHROPLASTY ANTERIOR APPROACH;  Surgeon: Yvone Rush, MD;  Location: [facility];  Service: Orthopedics;  Laterality: Right;    Allergies  Allergen Reactions   Donepezil      diarrhea   Namenda  [Memantine ]     headache   Latex Rash    Red rash around red bandaid    Allergies as of 02/14/2024       Reactions   Donepezil     diarrhea   Namenda  [memantine ]    headache   Latex  Rash   Red rash around red bandaid        Medication List        Accurate as of February 14, 2024  3:52 PM. If you have any questions, ask your nurse or doctor.          acetaminophen  500 MG tablet Commonly known as: TYLENOL  Take 1,000 mg by mouth every 12 (twelve) hours as needed.   aspirin  EC 81 MG tablet Take 81 mg by mouth daily. Swallow whole.   Biotin 5 MG Tabs Take 5,000 mg by mouth daily. Actually 5000 mcg -1 capsule  daily   brimonidine 0.2 % ophthalmic solution Commonly known as: ALPHAGAN SMARTSIG:In Eye(s)   brimonidine-timolol  0.2-0.5 % ophthalmic solution Commonly known as: COMBIGAN Apply to eye.   calcium carbonate 500 MG chewable tablet Commonly known as: TUMS - dosed in mg elemental calcium Chew 2 tablets by mouth daily.   CENTRUM SILVER  PO Take 1 tablet by mouth daily.   cholecalciferol 1000 units tablet Commonly known as: VITAMIN D  Take 2 tablets (2,000 Units total) by mouth daily.   cyanocobalamin  1000 MCG tablet Commonly known as: VITAMIN B12 Take 1,000 mcg by mouth daily.   dicyclomine  10 MG capsule Commonly known as: BENTYL  Take 1 capsule (10 mg total) by mouth every morning.   divalproex  125 MG DR tablet Commonly known as: Depakote  Take 1 tablet (125 mg total) by mouth 2 (two) times daily.   fluticasone 50 MCG/ACT nasal spray Commonly known as: FLONASE Place 1 spray into both nostrils every 12 (twelve) hours as needed for allergies or rhinitis.   ibuprofen  200 MG tablet Commonly known as: ADVIL  Take 200 mg by mouth every 6 (six) hours as needed.   latanoprost 0.005 % ophthalmic solution Commonly known as: XALATAN Place 1 drop into the left eye at bedtime.   loratadine  10 MG tablet Commonly known as: CLARITIN  Take 1 tablet (10 mg total) by mouth daily.   Polyethyl Glycol-Propyl Glycol 0.4-0.3 % Soln Place 1-2 drops into both eyes 3 (three) times daily as needed (for dry eyes).   polyethylene glycol powder 17 GM/SCOOP  powder Commonly known as: GLYCOLAX /MIRALAX  Take 17 g by mouth daily.   pravastatin  10 MG tablet Commonly known as: PRAVACHOL  TAKE 1 TABLET BY MOUTH DAILY   rivastigmine  4.5 MG capsule Commonly known as: EXELON  Take 1 cap  twice a day   senna 8.6 MG tablet Commonly known as: SENOKOT Take 1-2 tablets (8.6-17.2 mg total) by mouth daily as needed for constipation.   sertraline  25 MG tablet Commonly known as: ZOLOFT  Take 1 tablet (25 mg total) by mouth daily.   timolol  0.5 % ophthalmic solution Commonly known as: TIMOPTIC  SMARTSIG:In Eye(s)   traMADol  50 MG tablet Commonly known as: ULTRAM  Take by mouth every 8 (eight) hours as needed.   Vitamin C 500 MG Caps Take 500 mg by mouth daily.   Voltaren 1 % Gel Generic drug: diclofenac Sodium Apply topically as needed.        Review of Systems  Immunization History  Administered Date(s) Administered   Fluad  Quad(high Dose 65+) 03/26/2019, 04/23/2020, 04/29/2021, 05/03/2022, 05/10/2023   Fluad  Trivalent(High Dose 65+) 05/05/2023   Influenza Whole 05/02/2012, 05/02/2013   Influenza, High Dose Seasonal PF 04/11/2014, 04/12/2016, 04/13/2017   Influenza-Unspecified 04/27/2015, 05/12/2018   Moderna SARS-COV2 Booster Vaccination 06/17/2020, 12/18/2020   Moderna Sars-Covid-2 Vaccination 08/14/2019, 09/12/2019   Pneumococcal Conjugate-13 04/11/2015   Pneumococcal Polysaccharide-23 03/17/2011   Td 02/20/2008   Tdap 06/26/2019   Unspecified SARS-COV-2 Vaccination 08/14/2019, 09/12/2019, 06/17/2020, 12/18/2020, 06/02/2022   Zoster Recombinant(Shingrix) 09/08/2017, 11/24/2017   Zoster, Live 08/02/2010   Pertinent  Health Maintenance Due  Topic Date Due   INFLUENZA VACCINE  03/02/2024   DEXA SCAN  Completed   Colonoscopy  Discontinued      05/16/2023    1:29 PM 05/23/2023    3:14 PM 09/06/2023    1:01 PM 09/14/2023    2:16 PM 12/28/2023    3:44 PM  Fall Risk  Falls in the past year? 0 0 0 0 0  Was there an injury with  Fall? 0 0 0 0 0  Fall Risk Category Calculator 0 0 0 0 0  Patient at Risk for Falls Due to No Fall Risks   No Fall Risks   Fall risk Follow up Falls evaluation completed Falls evaluation completed Falls evaluation completed Falls evaluation completed;Falls prevention discussed Falls evaluation completed   Functional Status Survey:    Vitals:   02/14/24 1550  BP: (!) 149/77  Pulse: 70  Resp: 18  Temp: 97.9 F (36.6 C)  SpO2: 97%  Weight: 212 lb 12.8 oz (96.5 kg)  Height: 6' 2 (1.88 m)   Body mass index is 27.32 kg/m. Physical Exam  Labs reviewed: Recent Labs    05/16/23 1408 10/04/23 1612 01/26/24 0000  NA 142 142 144  K 4.1 4.4 4.2  CL 106 106 106  CO2 29 30 25*  GLUCOSE 100* 98  --   BUN 28* 25* 19  CREATININE 0.80 0.81 0.8  CALCIUM 10.1 10.0 10.1   Recent Labs    05/16/23 1408 10/04/23 1612 01/26/24 0000  AST 16 15 20   ALT 14 11 12   ALKPHOS 50 53 63  BILITOT 0.8 0.6  --   PROT 6.9 7.3  --   ALBUMIN 4.1 4.3 4.1   Recent Labs    10/04/23 1612 01/26/24 0000  WBC 7.9 6.4  NEUTROABS 4.6  --   HGB 14.9 14.1  HCT 44.5 43  MCV 91.7  --   PLT 262.0 262   Lab Results  Component Value Date   TSH 1.94 10/04/2023   Lab Results  Component Value Date   HGBA1C 5.9 10/04/2023   Lab Results  Component Value Date   CHOL 169 01/28/2022   HDL 54.40 01/28/2022   LDLCALC 89 01/28/2022   LDLDIRECT 85.6 02/20/2008   TRIG 127.0 01/28/2022   CHOLHDL 3 01/28/2022    Significant Diagnostic Results in last 30 days:  No results found.  Assessment/Plan There are no diagnoses linked to this encounter.   Family/ staff Communication:   Labs/tests ordered:

## 2024-02-15 DIAGNOSIS — R109 Unspecified abdominal pain: Secondary | ICD-10-CM | POA: Diagnosis not present

## 2024-02-15 DIAGNOSIS — N390 Urinary tract infection, site not specified: Secondary | ICD-10-CM | POA: Diagnosis not present

## 2024-02-15 LAB — BASIC METABOLIC PANEL WITH GFR
BUN: 23 — AB (ref 4–21)
CO2: 23 — AB (ref 13–22)
Chloride: 102 (ref 99–108)
Creatinine: 0.8 (ref 0.5–1.1)
Glucose: 80
Potassium: 3.9 meq/L (ref 3.5–5.1)
Sodium: 137 (ref 137–147)

## 2024-02-15 LAB — COMPREHENSIVE METABOLIC PANEL WITH GFR
Calcium: 9.7 (ref 8.7–10.7)
eGFR: 80

## 2024-02-27 ENCOUNTER — Encounter: Payer: Self-pay | Admitting: Adult Health

## 2024-02-27 ENCOUNTER — Non-Acute Institutional Stay: Admitting: Adult Health

## 2024-02-27 VITALS — BP 128/80 | HR 72 | Temp 97.7°F | Ht 74.0 in | Wt 214.2 lb

## 2024-02-27 DIAGNOSIS — E783 Hyperchylomicronemia: Secondary | ICD-10-CM | POA: Diagnosis not present

## 2024-02-27 DIAGNOSIS — F03918 Unspecified dementia, unspecified severity, with other behavioral disturbance: Secondary | ICD-10-CM

## 2024-02-27 DIAGNOSIS — R109 Unspecified abdominal pain: Secondary | ICD-10-CM | POA: Diagnosis not present

## 2024-02-27 MED ORDER — NYSTATIN 100000 UNIT/GM EX CREA
1.0000 | TOPICAL_CREAM | Freq: Two times a day (BID) | CUTANEOUS | Status: AC
Start: 2024-02-27 — End: 2024-03-05

## 2024-02-27 MED ORDER — SERTRALINE HCL 50 MG PO TABS: 25.0000 mg | ORAL_TABLET | Freq: Every day | ORAL | Status: DC

## 2024-02-27 NOTE — Progress Notes (Addendum)
 Location:  [facility]  POS: Clinic  Provider: Tawni America, ANP   Goals of Care:     02/14/2024    3:51 PM  Advanced Directives  Does Patient Have a Medical Advance Directive? Yes  Type of Advance Directive Living will;Healthcare Power of Attorney  Does patient want to make changes to medical advance directive? No - Patient declined  Copy of Healthcare Power of Attorney in Chart? Yes - validated most recent copy scanned in chart (See row information)     Chief Complaint  Patient presents with   Follow-up    3 month follow up    HPI: Discussed the use of AI scribe software for clinical note transcription with the patient, who gave verbal consent to proceed.  History of Present Illness   Morgan Huff is a 78 year old female who presents for routine f/u  Abdominal pain - Intermittent aching pain across the abdomen with a specific sore spot on the right side - Pain sometimes causes abdominal muscle tightening - CT abdomen in May showed no abnormal findings - Uncertain effectiveness of current medications, including Bentyl , due to lack of information about medications. Has seen GI with no acute issues identified.    Dementia with Mood disturbance and adjustment difficulties - On Zoloft  and Depakote  for mood adjustment - Variable effectiveness of mood medications: 'sometimes it works, sometimes it doesn't' - Difficulty adjusting to assisted living, with feelings of loss of independence and finding staff presence intrusive - Sadness or depressed mood depending on day and circumstances - No anxiety, shakiness, or nervousness - Sleep is adequate when undisturbed, but frequently interrupted by staff at night - Participates in meals and some activities, but environment is not conducive to her preferred lifestyle  Dermatologic symptoms - Rash on buttocks per staff. Concern for resistance to personal care.   Foot pain and calluses - Foot pain noted on the right side.  No injury, has seen podiatry in the past for callus removal.      Also she reports a sore throat and he voice is slightly raspy. No fever or cough. In her usual state of health.  MMSE 16/30  On exelon  Zoloft  started 02/07/24, repeat BMP NA 137 02/15/24 Depakote  started in may 2025 for mood     Past Medical History:  Diagnosis Date   Abdominal cramps 08/03/2022   Chronic abdominal cramps - periodic. Worse. Treat constipation.  (Pt saw Dr Rosalie, had an MRI).   Allergic rhinitis 08/03/2022   Chronic runny nose - Atrovent  did not work  Start Loratidine 10 mg/d   Arthritis    Atrophic vaginitis 04/29/2021   9/22  Dr Marget, dr Elisabeth  On estrocream vag 3/wk PV   Bilateral leg weakness 04/06/2013   Present since 2011-2012. She must push up from a chair   CAD (coronary artery disease) 04/29/2021   2021 CT coronary calcium score of 302.   Cont w/Simvastatin  - d/c, ASA  Cont on Pravastatin    Constipation 08/03/2022   Chronic abdominal cramps - periodic. Worse.  (Pt saw Dr Rosalie, had an MRI).   Diverticulosis of colon 10/02/2010   Dyslipidemia 02/20/2008   Simvastatin      NMR Lipoprofile 2005: LDL particle # 1776/ small dense 1106 on Lipitor 20 mg daily.   MGF MI in 45s; father MI @ 42.  No FH CVA  NMR 05/2011:LDL 78 (1169/542), HDL 41, TG 135. LDL goal < 100, ideally < 70.     [facility] panel: Hyperabsorber  of  dietary cholesterol; Lp(a) 112; CRP 2.7     12/19 cardiac CT scan for calcium scoring offered     Cont on Pravastatin       Dysrhythmia    palpitations recently   Elevated blood pressure reading without diagnosis of hypertension 04/10/2010   Generalized anxiety disorder 07/29/2021   12/22  Discussed. Pt declined meds, declined ref to psychology, psychiatry... Try Valerian root for anxiety   Glaucoma 03/17/2011   Dr Geneva, [facility]   Groin pain 03/26/2019   8/20 R - ?[facility] vs other  R/o hernia  Gen surg ref vs CT discussed  06/2019 CT ok - Dr Rosalie  [facility] strain   Heart murmur    History  of colonic polyps 03/25/2009       Colonoscopy 2009: Adenomatous polyps, Dr Rosalie.   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   Pigmentary glaucoma of both eyes, mild stage 04/09/2013   Plantar fasciitis, left 06/18/2009   Polycythemia, secondary 04/12/2016   Mild 2016   Pre-diabetes    per patient   Pseudophakia 06/23/2012   TIA (transient ischemic attack) 07/08/2019   12/20 probable-global aphasia, confusion  global aphasia, confusion - no relapse  Doppler (-) B  Card ECHO ordered   Urinary urgency 10/02/2010   Chronic  Dr Marget, Dr McDiarmid d/c. Seeing Dr Elisabeth  ?OAB  Trial of prn Ditropan  2022  Ditropan  po  Try estrocream vag 3/wk PV  On Macrobid qd  10/22 Memory loss is worse.  Likely aggravated by daily use of Ditropan .  She can switch to as needed use.  12/22 on Estrocream and Myrbetriq   Discussed anxiety issues. Pt declined meds, declined ref to psychology, psychiatry...  Try Valerian root for anxie   Varicose veins of lower extremities 07/13/2010    Past Surgical History:  Procedure Laterality Date   ABDOMINAL HYSTERECTOMY     ANTERIOR AND POSTERIOR REPAIR WITH SACROSPINOUS FIXATION N/A 06/05/2015   Procedure: ANTERIOR AND POSTERIOR REPAIR WITH SACROSPINOUS LIGAMENT SUSPENSION;  Surgeon: Alm Marget, MD;  Location: [facility];  Service: Gynecology;  Laterality: N/A;   CATARACT EXTRACTION, BILATERAL     lens implant   CHOLECYSTECTOMY     CHOLECYSTECTOMY, LAPAROSCOPIC     Dr Merrilyn   COLONOSCOPY  2015   neg; due 2020   colonoscopy  with polypectomy     Dr Rosalie; X 3   EYE SURGERY     trabeculectomy ;Dr Marci.Cataract removal OD; Dr Eliza   TOTAL HIP ARTHROPLASTY Right 09/22/2018   Procedure: TOTAL HIP ARTHROPLASTY ANTERIOR APPROACH;  Surgeon: Yvone Rush, MD;  Location: [facility];  Service: Orthopedics;  Laterality: Right;    Allergies  Allergen Reactions   Donepezil      diarrhea   Namenda  [Memantine ]     headache   Latex Rash    Red rash around red bandaid    Outpatient Encounter Medications as of 02/27/2024  Medication  Sig   acetaminophen  (TYLENOL ) 325 MG tablet Take 650 mg by mouth every morning.   acetaminophen  (TYLENOL ) 500 MG tablet Take 1,000 mg by mouth every 12 (twelve) hours as needed.   Ascorbic Acid (VITAMIN C) 500 MG CAPS Take 500 mg by mouth daily.   aspirin  81 MG EC tablet Take 81 mg by mouth daily. Swallow whole.   Biotin 5 MG TABS Take 5,000 mg by mouth daily. Actually 5000 mcg -1 capsule daily   brimonidine-timolol  (COMBIGAN) 0.2-0.5 % ophthalmic solution Apply to eye.   calcium carbonate (TUMS - DOSED IN MG ELEMENTAL CALCIUM) 500 MG chewable tablet Chew 2 tablets by mouth daily.    cholecalciferol (VITAMIN D ) 1000 units tablet Take 2 tablets (2,000 Units total) by mouth daily.   cyanocobalamin  (VITAMIN B12) 1000 MCG tablet Take 1,000 mcg by mouth daily.   diclofenac Sodium (VOLTAREN) 1 % GEL Apply topically as needed.   dicyclomine  (BENTYL ) 10 MG capsule Take 1 capsule (10 mg total) by mouth every morning.   divalproex  (DEPAKOTE ) 125 MG DR tablet Take 1 tablet (125 mg total) by mouth 2 (two) times daily.   fluticasone (FLONASE) 50 MCG/ACT nasal spray Place 1 spray into both nostrils every 12 (twelve) hours as needed for allergies or rhinitis.   ibuprofen  (ADVIL ) 200 MG tablet Take 200 mg by mouth every 6 (six) hours as needed.   latanoprost (XALATAN) 0.005 % ophthalmic solution Place 1 drop into the left eye at bedtime.   loratadine  (CLARITIN ) 10 MG tablet Take 1 tablet (10 mg total) by  mouth daily.   Multiple Vitamins-Minerals (CENTRUM SILVER  PO) Take 1 tablet by mouth daily.   nystatin  cream (MYCOSTATIN ) Apply 1 Application topically 2 (two) times daily for 7 days.   Polyethyl Glycol-Propyl Glycol 0.4-0.3 % SOLN Place 1-2 drops into both eyes 3 (three) times daily as needed (for dry eyes).    polyethylene glycol powder (GLYCOLAX /MIRALAX ) 17 GM/SCOOP powder Take 17 g by mouth daily.   pravastatin  (PRAVACHOL ) 10 MG tablet TAKE 1 TABLET BY MOUTH DAILY   rivastigmine  (EXELON ) 4.5 MG capsule Take 1 cap  twice a day   senna (SENOKOT) 8.6 MG tablet Take 1-2 tablets (8.6-17.2 mg total) by mouth daily as needed for constipation.   traMADol  (ULTRAM ) 50 MG tablet Take by mouth every 8 (eight) hours as needed.   [DISCONTINUED] sertraline  (ZOLOFT ) 25 MG tablet Take 1 tablet (25 mg total) by mouth daily.   sertraline  (ZOLOFT ) 50 MG tablet Take 0.5 tablets (25 mg total) by mouth daily.   No facility-administered encounter medications on file as of 02/27/2024.    Review of Systems:  Review of Systems  Constitutional:  Negative for activity change, appetite change, chills, diaphoresis, fatigue, fever and unexpected weight change.  HENT:  Positive for sore throat and voice change. Negative for congestion, dental problem, drooling, ear discharge, ear pain, facial swelling, hearing loss, mouth sores, nosebleeds, postnasal drip, rhinorrhea, sinus pressure, sinus pain, sneezing, tinnitus and trouble swallowing.   Respiratory:  Negative for cough, shortness of breath and wheezing.   Cardiovascular:  Positive for leg swelling. Negative for chest pain and palpitations.  Gastrointestinal:  Positive for abdominal pain. Negative for abdominal distention, constipation and diarrhea.  Genitourinary:  Negative for difficulty urinating and dysuria.  Musculoskeletal:  Positive for arthralgias. Negative for back pain, gait problem, joint swelling and myalgias.       Footpain  Skin:  Positive for rash.   Neurological:  Negative for dizziness, tremors, seizures, syncope, facial  asymmetry, speech difficulty, weakness, light-headedness, numbness and headaches.  Psychiatric/Behavioral:  Positive for agitation, behavioral problems, confusion and dysphoric mood. Negative for hallucinations, self-injury, sleep disturbance and suicidal ideas. The patient is nervous/anxious.     Health Maintenance  Topic Date Due   COVID-19 Vaccine (10 - 2024-25 season) 05/23/2024 (Originally 04/03/2023)   INFLUENZA VACCINE  03/02/2024   Medicare Annual Wellness (AWV)  09/13/2024   DTaP/Tdap/Td (3 - Td or Tdap) 06/25/2029   Pneumococcal Vaccine: 50+ Years  Completed   DEXA SCAN  Completed   Hepatitis C Screening  Completed   Zoster Vaccines- Shingrix  Completed   Hepatitis B Vaccines  Aged Out   HPV VACCINES  Aged Out   Meningococcal B Vaccine  Aged Out   Colonoscopy  Discontinued    Physical Exam: Vitals:   02/27/24 1453  BP: 128/80  Pulse: 72  Temp: 97.7 F (36.5 C)  SpO2: 96%  Weight: 214 lb 3.2 oz (97.2 kg)  Height: 6' 2 (1.88 m)   Body mass index is 27.5 kg/m. Wt Readings from Last 3 Encounters:  02/27/24 214 lb 3.2 oz (97.2 kg)  02/14/24 212 lb 12.8 oz (96.5 kg)  02/07/24 212 lb 3.2 oz (96.3 kg)    Physical Exam Constitutional:      General: She is not in acute distress.    Appearance: She is not diaphoretic.  HENT:     Head: Normocephalic and atraumatic.     Right Ear: Tympanic membrane normal.     Left Ear: Tympanic membrane normal.     Nose: Nose normal.     Mouth/Throat:     Mouth: Mucous membranes are moist.     Pharynx: Oropharynx is clear. Posterior oropharyngeal erythema present. No oropharyngeal exudate.  Eyes:     General:        Right eye: No discharge.        Left eye: No discharge.     Conjunctiva/sclera: Conjunctivae normal.     Pupils: Pupils are equal, round, and reactive to light.  Neck:     Vascular: No JVD.  Cardiovascular:     Rate and Rhythm: Normal rate  and regular rhythm.     Pulses:          Dorsalis pedis pulses are 2+ on the right side and 1+ on the left side.     Heart sounds: No murmur heard. Pulmonary:     Effort: Pulmonary effort is normal. No respiratory distress.     Breath sounds: Normal breath sounds. No wheezing.  Abdominal:     General: Bowel sounds are normal. There is no distension.     Palpations: Abdomen is soft.     Tenderness: There is no abdominal tenderness. There is no guarding.  Musculoskeletal:     Cervical back: No rigidity or tenderness.     Right lower leg: Edema present.     Left lower leg: Edema present.  Feet:     Right foot:     Skin integrity: Callus present.     Left foot:     Skin integrity: Skin integrity normal.  Lymphadenopathy:     Cervical: No cervical adenopathy.  Skin:    General: Skin is warm and dry.  Neurological:     Mental Status: She is alert and oriented to person, place, and time.     Labs reviewed: Basic Metabolic Panel: Recent Labs    05/16/23 1408 10/04/23 1612 01/26/24 0000  NA 142 142 144  K 4.1  4.4 4.2  CL 106 106 106  CO2 29 30 25*  GLUCOSE 100* 98  --   BUN 28* 25* 19  CREATININE 0.80 0.81 0.8  CALCIUM 10.1 10.0 10.1  TSH  --  1.94  --    Liver Function Tests: Recent Labs    05/16/23 1408 10/04/23 1612 01/26/24 0000  AST 16 15 20   ALT 14 11 12   ALKPHOS 50 53 63  BILITOT 0.8 0.6  --   PROT 6.9 7.3  --   ALBUMIN 4.1 4.3 4.1   No results for input(s): LIPASE, AMYLASE in the last 8760 hours. No results for input(s): AMMONIA in the last 8760 hours. CBC: Recent Labs    10/04/23 1612 01/26/24 0000  WBC 7.9 6.4  NEUTROABS 4.6  --   HGB 14.9 14.1  HCT 44.5 43  MCV 91.7  --   PLT 262.0 262   Lipid Panel: No results for input(s): CHOL, HDL, LDLCALC, TRIG, CHOLHDL, LDLDIRECT in the last 8760 hours. Lab Results  Component Value Date   HGBA1C 5.9 10/04/2023    Procedures since last visit: No results  found.  Assessment/Plan Assessment and Plan    Dementia with mood disturbance followed by neurology MRI of the brain 06/2022 IMPRESSION: 1. No evidence of acute intracranial abnormality. 2. Small chronic lacunar infarcts within the corona radiata, bilaterally. 3. Background mild cerebral white matter chronic small vessel ischemic disease. 4. Probable posterior fossa arachnoid cyst, as described. 5. Mild generalized cerebral atrophy.  Difficulty adjusting to assisted living affecting mood. On Zoloft  and Depakote  Increased Zoloft  to manage symptoms and improve adjustment. - Increase Zoloft  dosage. May also need to go up on depakote   - Follow up in six weeks to assess adjustment and medication efficacy.  Chronic Abdominal Pain Chronic right-sided abdominal pain. Recent CT showed no concerning findings. Pain described as aching with occasional stomach tightening. Bentyl  prescribed for gas management.  Intertrigo Rash on bottom due to moisture and friction. Area red and irritated. Advised to keep area dry and change pads regularly. Topical cream provided for moisture-related irritation. - Provide nystatin  topical cream for moisture-related irritation. - Advise on keeping the area dry and changing pads regularly.  Foot Callus/pain Painful foot callus causing discomfort and swelling.  Considering different footwear to reduce friction. - Consider trying different footwear to reduce friction. Discussed possible orthotic.  -she will hold off today   General Health Maintenance Blood pressure 128/80 mmHg, weight stable, normal liver and kidney function, normal hemoglobin. Lipid panel not recently done. Mammogram due in September, bone density test ordered but not completed. - Order lipid panel during next blood work. - Scheduled mammogram in September. - Ensure bone density test       Sore throat Very mild erythema possible viral etiology No fever, drainage, clear lungs noted.  If  worsening suggest swab for covid/strep    Total time :  time greater than 50% of total time spent doing pt counseling and coordination of care

## 2024-03-12 ENCOUNTER — Other Ambulatory Visit: Payer: Self-pay | Admitting: Adult Health

## 2024-03-12 MED ORDER — DIVALPROEX SODIUM 125 MG PO DR TAB: 125.0000 mg | DELAYED_RELEASE_TABLET | Freq: Two times a day (BID) | ORAL | Status: DC

## 2024-03-18 ENCOUNTER — Encounter: Payer: Self-pay | Admitting: Internal Medicine

## 2024-03-22 DIAGNOSIS — R2681 Unsteadiness on feet: Secondary | ICD-10-CM | POA: Diagnosis not present

## 2024-03-22 DIAGNOSIS — R413 Other amnesia: Secondary | ICD-10-CM | POA: Diagnosis not present

## 2024-03-22 DIAGNOSIS — R41841 Cognitive communication deficit: Secondary | ICD-10-CM | POA: Diagnosis not present

## 2024-03-23 DIAGNOSIS — R41841 Cognitive communication deficit: Secondary | ICD-10-CM | POA: Diagnosis not present

## 2024-03-23 DIAGNOSIS — R413 Other amnesia: Secondary | ICD-10-CM | POA: Diagnosis not present

## 2024-03-23 DIAGNOSIS — R2681 Unsteadiness on feet: Secondary | ICD-10-CM | POA: Diagnosis not present

## 2024-03-23 DIAGNOSIS — F332 Major depressive disorder, recurrent severe without psychotic features: Secondary | ICD-10-CM | POA: Diagnosis not present

## 2024-03-26 DIAGNOSIS — R41841 Cognitive communication deficit: Secondary | ICD-10-CM | POA: Diagnosis not present

## 2024-03-26 DIAGNOSIS — R2681 Unsteadiness on feet: Secondary | ICD-10-CM | POA: Diagnosis not present

## 2024-03-26 DIAGNOSIS — R413 Other amnesia: Secondary | ICD-10-CM | POA: Diagnosis not present

## 2024-03-27 DIAGNOSIS — R2681 Unsteadiness on feet: Secondary | ICD-10-CM | POA: Diagnosis not present

## 2024-03-27 DIAGNOSIS — R41841 Cognitive communication deficit: Secondary | ICD-10-CM | POA: Diagnosis not present

## 2024-03-27 DIAGNOSIS — R413 Other amnesia: Secondary | ICD-10-CM | POA: Diagnosis not present

## 2024-03-28 DIAGNOSIS — R413 Other amnesia: Secondary | ICD-10-CM | POA: Diagnosis not present

## 2024-03-28 DIAGNOSIS — R41841 Cognitive communication deficit: Secondary | ICD-10-CM | POA: Diagnosis not present

## 2024-03-28 DIAGNOSIS — R2681 Unsteadiness on feet: Secondary | ICD-10-CM | POA: Diagnosis not present

## 2024-04-02 DIAGNOSIS — R2681 Unsteadiness on feet: Secondary | ICD-10-CM | POA: Diagnosis not present

## 2024-04-02 DIAGNOSIS — R41841 Cognitive communication deficit: Secondary | ICD-10-CM | POA: Diagnosis not present

## 2024-04-02 DIAGNOSIS — R413 Other amnesia: Secondary | ICD-10-CM | POA: Diagnosis not present

## 2024-04-03 ENCOUNTER — Non-Acute Institutional Stay: Payer: Self-pay | Admitting: Orthopedic Surgery

## 2024-04-03 ENCOUNTER — Encounter: Payer: Self-pay | Admitting: Orthopedic Surgery

## 2024-04-03 DIAGNOSIS — R2681 Unsteadiness on feet: Secondary | ICD-10-CM | POA: Diagnosis not present

## 2024-04-03 DIAGNOSIS — F03918 Unspecified dementia, unspecified severity, with other behavioral disturbance: Secondary | ICD-10-CM | POA: Diagnosis not present

## 2024-04-03 DIAGNOSIS — R600 Localized edema: Secondary | ICD-10-CM

## 2024-04-03 DIAGNOSIS — R635 Abnormal weight gain: Secondary | ICD-10-CM

## 2024-04-03 DIAGNOSIS — E039 Hypothyroidism, unspecified: Secondary | ICD-10-CM | POA: Diagnosis not present

## 2024-04-03 DIAGNOSIS — Z5181 Encounter for therapeutic drug level monitoring: Secondary | ICD-10-CM | POA: Diagnosis not present

## 2024-04-03 DIAGNOSIS — I872 Venous insufficiency (chronic) (peripheral): Secondary | ICD-10-CM | POA: Diagnosis not present

## 2024-04-03 DIAGNOSIS — R41841 Cognitive communication deficit: Secondary | ICD-10-CM | POA: Diagnosis not present

## 2024-04-03 DIAGNOSIS — R413 Other amnesia: Secondary | ICD-10-CM | POA: Diagnosis not present

## 2024-04-03 LAB — BASIC METABOLIC PANEL WITH GFR
BUN: 25 — AB (ref 4–21)
CO2: 25 — AB (ref 13–22)
Chloride: 108 (ref 99–108)
Creatinine: 0.7 (ref 0.5–1.1)
Glucose: 94
Potassium: 4.3 meq/L (ref 3.5–5.1)
Sodium: 144 (ref 137–147)

## 2024-04-03 LAB — HEPATIC FUNCTION PANEL
ALT: 14 U/L (ref 7–35)
AST: 21 (ref 13–35)
Alkaline Phosphatase: 63 (ref 25–125)
Bilirubin, Total: 0.4

## 2024-04-03 LAB — COMPREHENSIVE METABOLIC PANEL WITH GFR
Albumin: 4 (ref 3.5–5.0)
Calcium: 9.9 (ref 8.7–10.7)
Globulin: 2.2
eGFR: 83

## 2024-04-03 LAB — TSH: TSH: 3.24 (ref 0.41–5.90)

## 2024-04-03 MED ORDER — FUROSEMIDE 20 MG PO TABS: 40.0000 mg | ORAL_TABLET | Freq: Every morning | ORAL | Status: DC

## 2024-04-03 NOTE — Progress Notes (Signed)
 Location:  [facility] Room Number: 615-P Place of Service:  ALF [phone]) Provider:  Gil No NP  Patient Care Team: Charlanne Fredia CROME, MD as PCP - General (Internal Medicine) Rosalie Kitchens, MD as Consulting Physician (Gastroenterology) Marget Lenis, MD as Consulting Physician (Obstetrics and Gynecology) Harvey Seltzer, MD as Consulting Physician (Sports Medicine) Yvone Rush, MD as Consulting Physician (Orthopedic Surgery) Gaston Hamilton, MD as Consulting Physician (Urology) Elisabeth Valli BIRCH, MD as Consulting Physician (Urology) Wertman, Sara E, PA-C (Neurology) Bond, Reyes Mcardle, MD as Referring Physician (Ophthalmology)  Extended Emergency Contact Information Primary Emergency Contact: [facility] [place] [facility] Phone: [phone] Mobile Phone: [phone] Relation: Other Secondary Emergency Contact: Diehl,Carol Address: [address]          [place], [place] [address] [place] Home Phone: [phone] Mobile Phone: [phone] Relation: Sister  Code Status:  Full Code Goals of care: Advanced Directive information    02/14/2024    3:51 PM  Advanced Directives  Does Patient Have a Medical Advance Directive? Yes  Type of Advance Directive Living will;Healthcare Power of Attorney  Does patient want to make changes to medical advance directive? No - Patient declined  Copy of Healthcare Power of Attorney in Chart? Yes - validated most recent copy scanned in chart (See row information)     Chief Complaint  Patient presents with   Edema    Lower leg edema    HPI:  Pt is a 78 y.o. female seen today for acute visit due to lower leg edema.   She currently resides on the assisted living unit at [facility]. PMH: CAD, TIA, HLD, diverticulosis, AD, arthritis, anxiety, constipation and urinary urgency/incontinence.   Poor historian due to dementia. Nursing reports ongoing lower leg swelling x 1 week. Lower  legs began weeping 2 days ago. She has been having legs wrapped, which has helped some. H/o varicose veins. No h/o CHF. LVEF 65-70% 08/2019. She has also gained 10 lbs in 2 months. Denies chest pain, sob, cough or calf pain. Afebrile. Vitals stable.    Past Medical History:  Diagnosis Date   Abdominal cramps 08/03/2022   Chronic abdominal cramps - periodic. Worse. Treat constipation.  (Pt saw Dr Rosalie, had an MRI).   Allergic rhinitis 08/03/2022   Chronic runny nose - Atrovent  did not work  Start Loratidine 10 mg/d   Arthritis    Atrophic vaginitis 04/29/2021   9/22  Dr Marget, dr Elisabeth  On estrocream vag 3/wk PV   Bilateral leg weakness 04/06/2013   Present since 2011-2012. She must push up from a chair   CAD (coronary artery disease) 04/29/2021   2021 CT coronary calcium score of 302.   Cont w/Simvastatin  - d/c, ASA  Cont on Pravastatin    Constipation 08/03/2022   Chronic abdominal cramps - periodic. Worse.  (Pt saw Dr Rosalie, had an MRI).   Diverticulosis of colon 10/02/2010   Dyslipidemia 02/20/2008   Simvastatin      NMR Lipoprofile 2005: LDL particle # 1776/ small dense 1106 on Lipitor 20 mg daily.   MGF MI in 73s; father MI @ 52.  No FH CVA  NMR 05/2011:LDL 78 (1169/542), HDL 41, TG 135. LDL goal < 100, ideally < 70.     [facility] panel: Hyperabsorber of  dietary cholesterol; Lp(a) 112; CRP 2.7     12/19 cardiac CT scan for calcium scoring offered     Cont on Pravastatin       Dysrhythmia  palpitations recently   Elevated blood pressure reading without diagnosis of hypertension 04/10/2010   Generalized anxiety disorder 07/29/2021   12/22  Discussed. Pt declined meds, declined ref to psychology, psychiatry... Try Valerian root for anxiety   Glaucoma 03/17/2011   Dr Geneva, [facility] Ophth   Groin pain 03/26/2019   8/20 R - ?[facility] vs other  R/o hernia  Gen surg ref vs CT discussed  06/2019 CT ok - Dr Rosalie  MSK strain   Heart murmur    History of colonic polyps 03/25/2009       Colonoscopy  2009: Adenomatous polyps, Dr Rosalie.   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   Pigmentary glaucoma of both eyes, mild stage 04/09/2013   Plantar fasciitis, left 06/18/2009   Polycythemia, secondary 04/12/2016   Mild 2016   Pre-diabetes    per patient   Pseudophakia 06/23/2012   TIA (transient ischemic attack) 07/08/2019   12/20 probable-global aphasia, confusion  global aphasia, confusion - no relapse  Doppler (-) B  Card ECHO ordered   Urinary urgency 10/02/2010   Chronic  Dr Marget, Dr McDiarmid d/c. Seeing Dr Elisabeth  ?OAB  Trial of prn Ditropan  2022  Ditropan  po  Try estrocream vag 3/wk PV  On Macrobid qd  10/22 Memory loss is worse.  Likely aggravated by daily use of Ditropan .  She can switch to as needed use.  12/22 on Estrocream and Myrbetriq   Discussed anxiety issues. Pt declined meds, declined ref to psychology, psychiatry...  Try Valerian root for anxie   Varicose veins of lower extremities 07/13/2010   Past Surgical History:  Procedure Laterality Date   ABDOMINAL HYSTERECTOMY     ANTERIOR AND POSTERIOR REPAIR WITH SACROSPINOUS FIXATION N/A 06/05/2015   Procedure: ANTERIOR AND POSTERIOR REPAIR WITH SACROSPINOUS LIGAMENT SUSPENSION;  Surgeon: Alm Marget, MD;  Location: [facility];  Service: Gynecology;  Laterality: N/A;   CATARACT EXTRACTION, BILATERAL     lens implant   CHOLECYSTECTOMY     CHOLECYSTECTOMY, LAPAROSCOPIC     Dr Merrilyn   COLONOSCOPY  2015   neg; due 2020   colonoscopy with polypectomy     Dr Rosalie; X 3   EYE SURGERY      trabeculectomy ;Dr Marci.Cataract removal OD; Dr Eliza   TOTAL HIP ARTHROPLASTY Right 09/22/2018   Procedure: TOTAL HIP ARTHROPLASTY ANTERIOR APPROACH;  Surgeon: Yvone Rush, MD;  Location: [facility];  Service: Orthopedics;  Laterality: Right;    Allergies  Allergen Reactions   Donepezil      diarrhea   Namenda  [Memantine ]     headache   Latex Rash    Red rash around red bandaid    Outpatient Encounter Medications as of 04/03/2024  Medication Sig   acetaminophen  (TYLENOL ) 325 MG tablet Take 650 mg by mouth every morning.   acetaminophen  (TYLENOL ) 500 MG tablet Take 1,000 mg by mouth every 12 (twelve) hours as needed.   Ascorbic Acid (  VITAMIN C) 500 MG CAPS Take 500 mg by mouth daily.   aspirin  81 MG EC tablet Take 81 mg by mouth daily. Swallow whole.   Biotin 5 MG TABS Take 1 tablet by mouth daily. Actually 5000 mcg -1 capsule daily   brimonidine-timolol  (COMBIGAN) 0.2-0.5 % ophthalmic solution Place 1 drop into the left eye 2 (two) times daily.   calcium carbonate (TUMS - DOSED IN MG ELEMENTAL CALCIUM) 500 MG chewable tablet Chew 2 tablets by mouth daily.    cetirizine (ZYRTEC) 10 MG tablet Take 10 mg by mouth daily.   cholecalciferol (VITAMIN D ) 1000 units tablet Take 2 tablets (2,000 Units total) by mouth daily.   cyanocobalamin  (VITAMIN B12) 1000 MCG tablet Take 1,000 mcg by mouth daily.   dextromethorphan-guaiFENesin (ROBITUSSIN-DM) 10-100 MG/5ML liquid Take 10 mLs by mouth every 6 (six) hours as needed for cough.   diclofenac Sodium (VOLTAREN) 1 % GEL Apply topically as needed.   dicyclomine  (BENTYL ) 10 MG capsule Take 1 capsule (10 mg total) by mouth every morning.   divalproex  (DEPAKOTE ) 125 MG DR tablet Take 125 mg by mouth daily at 6 (six) AM.   divalproex  (DEPAKOTE ) 250 MG DR tablet Take 250 mg by mouth daily. Daily in the evenings for aggressive behavior.   fluticasone (FLONASE) 50 MCG/ACT nasal spray Place 1 spray into both nostrils every 12 (twelve) hours as needed for  allergies or rhinitis.   ibuprofen  (ADVIL ) 200 MG tablet Take 200 mg by mouth every 6 (six) hours as needed.   latanoprost (XALATAN) 0.005 % ophthalmic solution Place 1 drop into the left eye at bedtime.   Multiple Vitamins-Minerals (CENTRUM SILVER  PO) Take 1 tablet by mouth daily.   Polyethyl Glycol-Propyl Glycol 0.4-0.3 % SOLN Place 1-2 drops into both eyes daily as needed (for dry eyes).   polyethylene glycol powder (GLYCOLAX /MIRALAX ) 17 GM/SCOOP powder Take 17 g by mouth daily.   potassium chloride (KLOR-CON M) 10 MEQ tablet Take 10 mEq by mouth in the morning. For edema. 2 days only   pravastatin  (PRAVACHOL ) 10 MG tablet TAKE 1 TABLET BY MOUTH DAILY   rivastigmine  (EXELON ) 3 MG capsule Take 3 mg by mouth in the morning.   rivastigmine  (EXELON ) 3 MG capsule Take 2 capsules by mouth at bedtime.   senna (SENOKOT) 8.6 MG tablet Take 1-2 tablets (8.6-17.2 mg total) by mouth daily as needed for constipation.   sertraline  (ZOLOFT ) 50 MG tablet Take 50 mg by mouth daily.   traMADol  (ULTRAM ) 50 MG tablet Take by mouth every 8 (eight) hours as needed.   divalproex  (DEPAKOTE ) 125 MG DR tablet Take 1 tablet (125 mg total) by mouth 2 (two) times daily. 125 mg in am and 250 mg in pm (Patient not taking: Reported on 04/03/2024)   loratadine  (CLARITIN ) 10 MG tablet Take 1 tablet (10 mg total) by mouth daily. (Patient not taking: Reported on 04/03/2024)   rivastigmine  (EXELON ) 4.5 MG capsule Take 1 cap  twice a day (Patient not taking: Reported on 04/03/2024)   sertraline  (ZOLOFT ) 50 MG tablet Take 0.5 tablets (25 mg total) by mouth daily. (Patient not taking: Reported on 04/03/2024)   No facility-administered encounter medications on file as of 04/03/2024.    Review of Systems  Unable to perform ROS: Dementia    Immunization History  Administered Date(s) Administered    sv, Bivalent, Protein Subunit Rsvpref,pf Marlow) 08/11/2022   Fluad  Quad(high Dose 65+) 03/26/2019, 04/23/2020, 04/29/2021, 05/03/2022,  05/10/2023   Fluad  Trivalent(High Dose 65+) 05/05/2023  INFLUENZA, HIGH DOSE SEASONAL PF 04/11/2014, 04/12/2016, 04/13/2017   Influenza Whole 05/02/2012, 05/02/2013   Influenza-Unspecified 04/27/2015, 05/12/2018   Moderna SARS-COV2 Booster Vaccination 06/17/2020, 12/18/2020   Moderna Sars-Covid-2 Vaccination 08/14/2019, 09/12/2019   Pneumococcal Conjugate-13 04/11/2015   Pneumococcal Polysaccharide-23 03/17/2011   Td 02/20/2008   Tdap 06/26/2019   Unspecified SARS-COV-2 Vaccination 08/14/2019, 09/12/2019, 06/17/2020, 12/18/2020, 06/02/2022, 05/05/2023   Zoster Recombinant(Shingrix) 09/08/2017, 11/24/2017   Zoster, Live 08/02/2010   Pertinent  Health Maintenance Due  Topic Date Due   INFLUENZA VACCINE  03/02/2024   DEXA SCAN  Completed   Colonoscopy  Discontinued      05/23/2023    3:14 PM 09/06/2023    1:01 PM 09/14/2023    2:16 PM 12/28/2023    3:44 PM 02/27/2024    3:05 PM  Fall Risk  Falls in the past year? 0 0 0 0 0  Was there an injury with Fall? 0 0 0 0 0  Fall Risk Category Calculator 0 0 0 0 0  Patient at Risk for Falls Due to   No Fall Risks    Fall risk Follow up Falls evaluation completed Falls evaluation completed Falls evaluation completed;Falls prevention discussed Falls evaluation completed Falls evaluation completed   Functional Status Survey:    Vitals:   04/03/24 1606  BP: 118/72  Pulse: 84  SpO2: 94%  Weight: 218 lb 12.8 oz (99.2 kg)  Height: 6' 2 (1.88 m)   Body mass index is 28.09 kg/m. Physical Exam Vitals reviewed.  Constitutional:      General: She is not in acute distress. HENT:     Head: Normocephalic.     Nose: Nose normal.     Mouth/Throat:     Mouth: Mucous membranes are moist.  Eyes:     General:        Right eye: No discharge.        Left eye: No discharge.  Cardiovascular:     Rate and Rhythm: Normal rate and regular rhythm.     Pulses: Normal pulses.     Heart sounds: Normal heart sounds.  Pulmonary:     Effort: Pulmonary  effort is normal. No respiratory distress.     Breath sounds: Normal breath sounds. No wheezing or rales.  Abdominal:     Palpations: Abdomen is soft.  Musculoskeletal:     Cervical back: Neck supple.     Right lower leg: Edema present.     Left lower leg: Edema present.     Comments: BLE with 2+ pitting edema, brown discoloration to both lower extremities  Skin:    General: Skin is warm.     Capillary Refill: Capillary refill takes less than 2 seconds.     Comments: Weeping blisters to BLE, vary in size, right extremity> left, no sign of infection  Neurological:     General: No focal deficit present.     Mental Status: She is alert. Mental status is at baseline.     Gait: Gait abnormal.  Psychiatric:        Mood and Affect: Mood normal.     Labs reviewed: Recent Labs    05/16/23 1408 10/04/23 1612 01/26/24 0000 02/15/24 0000  NA 142 142 144 137  K 4.1 4.4 4.2 3.9  CL 106 106 106 102  CO2 29 30 25* 23*  GLUCOSE 100* 98  --   --   BUN 28* 25* 19 23*  CREATININE 0.80 0.81 0.8 0.8  CALCIUM 10.1 10.0 10.1 9.7  Recent Labs    05/16/23 1408 10/04/23 1612 01/26/24 0000  AST 16 15 20   ALT 14 11 12   ALKPHOS 50 53 63  BILITOT 0.8 0.6  --   PROT 6.9 7.3  --   ALBUMIN 4.1 4.3 4.1   Recent Labs    10/04/23 1612 01/26/24 0000  WBC 7.9 6.4  NEUTROABS 4.6  --   HGB 14.9 14.1  HCT 44.5 43  MCV 91.7  --   PLT 262.0 262   Lab Results  Component Value Date   TSH 1.94 10/04/2023   Lab Results  Component Value Date   HGBA1C 5.9 10/04/2023   Lab Results  Component Value Date   CHOL 169 01/28/2022   HDL 54.40 01/28/2022   LDLCALC 89 01/28/2022   LDLDIRECT 85.6 02/20/2008   TRIG 127.0 01/28/2022   CHOLHDL 3 01/28/2022    Significant Diagnostic Results in last 30 days:  No results found.  Assessment/Plan 1. Chronic venous insufficiency of lower extremity (Primary) - BLE swelling x 1 week, brown discoloration to BLE, no weeping - venous insufficiency vs CHF   - weight gain 10 lbs in 2 months - no acute cough or sob - LVEF 65-70% 08/2019 - start furosemide  40 mg qam x 2 days - start KCL 10 meq po qam x 2 days - wrap legs qam x 7 days - limit sodium in diet - repeat bmp 09/04 - if no improvement consider BNP  2. Edema of both lower legs - see above  3. Weight gain - weight up 10 lbs in 2 months  - followed by dietary - cont monthly weights - TSH pending   4. Dementia with behavioral disturbance (HCC) - followed by neurology - will refuse care at times per nursing  - MMSE 16/30 12/2023 - see above - ambulates with walker - cont Depakote  and rivastigmine     Family/ staff Communication: plan discussed with patient and nurse  Labs/tests ordered:  valproic level, TSH, CMP pending

## 2024-04-04 ENCOUNTER — Telehealth: Payer: Self-pay

## 2024-04-04 DIAGNOSIS — R2681 Unsteadiness on feet: Secondary | ICD-10-CM | POA: Diagnosis not present

## 2024-04-04 DIAGNOSIS — R41841 Cognitive communication deficit: Secondary | ICD-10-CM | POA: Diagnosis not present

## 2024-04-04 DIAGNOSIS — R413 Other amnesia: Secondary | ICD-10-CM | POA: Diagnosis not present

## 2024-04-04 NOTE — Telephone Encounter (Signed)
 Left VM for patient. Paperwork for [facility] ready for pickup at front desk.

## 2024-04-05 DIAGNOSIS — R413 Other amnesia: Secondary | ICD-10-CM | POA: Diagnosis not present

## 2024-04-05 DIAGNOSIS — R41841 Cognitive communication deficit: Secondary | ICD-10-CM | POA: Diagnosis not present

## 2024-04-05 DIAGNOSIS — R609 Edema, unspecified: Secondary | ICD-10-CM | POA: Diagnosis not present

## 2024-04-05 DIAGNOSIS — R2681 Unsteadiness on feet: Secondary | ICD-10-CM | POA: Diagnosis not present

## 2024-04-05 LAB — BASIC METABOLIC PANEL WITH GFR
BUN: 27 — AB (ref 4–21)
CO2: 25 — AB (ref 13–22)
Chloride: 105 (ref 99–108)
Creatinine: 0.8 (ref 0.5–1.1)
Glucose: 90
Potassium: 3.9 meq/L (ref 3.5–5.1)
Sodium: 145 (ref 137–147)

## 2024-04-05 LAB — COMPREHENSIVE METABOLIC PANEL WITH GFR
Calcium: 9.6 (ref 8.7–10.7)
eGFR: 74

## 2024-04-09 ENCOUNTER — Encounter: Payer: Self-pay | Admitting: Adult Health

## 2024-04-09 ENCOUNTER — Non-Acute Institutional Stay: Admitting: Adult Health

## 2024-04-09 VITALS — BP 126/62 | HR 91 | Temp 98.1°F | Ht 74.0 in | Wt 216.6 lb

## 2024-04-09 DIAGNOSIS — G301 Alzheimer's disease with late onset: Secondary | ICD-10-CM | POA: Diagnosis not present

## 2024-04-09 DIAGNOSIS — F02B18 Dementia in other diseases classified elsewhere, moderate, with other behavioral disturbance: Secondary | ICD-10-CM

## 2024-04-09 DIAGNOSIS — I872 Venous insufficiency (chronic) (peripheral): Secondary | ICD-10-CM | POA: Diagnosis not present

## 2024-04-09 DIAGNOSIS — R413 Other amnesia: Secondary | ICD-10-CM | POA: Diagnosis not present

## 2024-04-09 DIAGNOSIS — R2681 Unsteadiness on feet: Secondary | ICD-10-CM | POA: Diagnosis not present

## 2024-04-09 DIAGNOSIS — R41841 Cognitive communication deficit: Secondary | ICD-10-CM | POA: Diagnosis not present

## 2024-04-09 MED ORDER — FUROSEMIDE 20 MG PO TABS: 20.0000 mg | ORAL_TABLET | Freq: Every day | ORAL | Status: DC

## 2024-04-09 MED ORDER — ALPRAZOLAM 0.5 MG PO TABS: 0.5000 mg | ORAL_TABLET | Freq: Every day | ORAL | 0 refills | Status: DC | PRN

## 2024-04-09 MED ORDER — POTASSIUM CHLORIDE CRYS ER 20 MEQ PO TBCR: 20.0000 meq | EXTENDED_RELEASE_TABLET | Freq: Every day | ORAL | Status: AC

## 2024-04-09 NOTE — Progress Notes (Signed)
 Location:  [facility]  POS: Clinic  Provider: Tawni America, ANP   Goals of Care:     02/14/2024    3:51 PM  Advanced Directives  Does Patient Have a Medical Advance Directive? Yes  Type of Advance Directive Living will;Healthcare Power of Attorney  Does patient want to make changes to medical advance directive? No - Patient declined  Copy of Healthcare Power of Attorney in Chart? Yes - validated most recent copy scanned in chart (See row information)     Chief Complaint  Patient presents with   Medical Management of Chronic Issues    6 week follow up. Sister wants to talk about meds. Legs and feet swollen with blisters. Postponed flu and covid vaccine. Plans to get when they do them here.    HPI: Discussed the use of AI scribe software for clinical note transcription with the patient, who gave verbal consent to proceed.  History of Present Illness Morgan Huff is a 78 year old female with a hx of dementia presents with achy, swollen legs and weight gain. She is accompanied by her sister.  Lower extremity edema and skin changes - Achy, swollen legs, primarily on the right side, with progressive worsening over time - Swelling associated with blisters and sores that leak fluid - Varicose veins present, with a family history of varicosities - Legs have been wrapped intermittently  Weight gain and fluid retention - Gained ten pounds since moving to current living situation - Attributes weight gain to fluid retention  Respiratory and cardiovascular symptoms - No shortness of breath or wheezing with walking  Neuropsychiatric and sleep disturbance - Difficulty sleeping due to interruptions from staff at assisted living facility, often coinciding with bedtime - Able to sleep well once the room is clear - Reports from the nursing staff indicate that she is easily angered, progressively more forgetful and confused. Dysphoric mood, anxious -follows with Dr  Legrand      Past Medical History:  Diagnosis Date   Abdominal cramps 08/03/2022   Chronic abdominal cramps - periodic. Worse. Treat constipation.  (Pt saw Dr Rosalie, had an MRI).   Allergic rhinitis 08/03/2022   Chronic runny nose - Atrovent  did not work  Start Loratidine 10 mg/d   Arthritis    Atrophic vaginitis 04/29/2021   9/22  Dr Marget, dr Elisabeth  On estrocream vag 3/wk PV   Bilateral leg weakness 04/06/2013   Present since 2011-2012. She must push up from a chair   CAD (coronary artery disease) 04/29/2021   2021 CT coronary calcium score of 302.   Cont w/Simvastatin  - d/c, ASA  Cont on Pravastatin    Cataract    Both eyes have had lens replacement.   Constipation 08/03/2022   Chronic abdominal cramps - periodic. Worse.  (Pt saw Dr Rosalie, had an MRI).   Diverticulosis of colon 10/02/2010   Dyslipidemia 02/20/2008   Simvastatin      NMR Lipoprofile 2005: LDL particle # 1776/ small dense 1106 on Lipitor 20 mg daily.   MGF MI in 86s; father MI @ 11.  No FH CVA  NMR 05/2011:LDL 78 (1169/542), HDL 41, TG 135. LDL goal < 100, ideally < 70.     [facility] panel: Hyperabsorber of  dietary cholesterol; Lp(a) 112; CRP 2.7     12/19 cardiac CT scan for calcium scoring offered     Cont on Pravastatin       Dysrhythmia    palpitations recently   Elevated blood pressure reading  without diagnosis of hypertension 04/10/2010   Generalized anxiety disorder 07/29/2021   12/22  Discussed. Pt declined meds, declined ref to psychology, psychiatry... Try Valerian root for anxiety   Glaucoma 03/17/2011   Groin pain 03/26/2019   8/20 R - ?MSK vs other  R/o hernia  Gen surg ref vs CT discussed  06/2019 CT ok - Dr Rosalie  MSK strain   Heart murmur    History of colonic polyps 03/25/2009       Colonoscopy 2009: Adenomatous polyps, Dr Rosalie.   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   Pigmentary glaucoma of both eyes, mild stage 04/09/2013   Plantar fasciitis, left 06/18/2009   Polycythemia, secondary 04/12/2016   Mild 2016   Pre-diabetes    per patient   Pseudophakia 06/23/2012   TIA (transient ischemic attack) 07/08/2019   12/20 probable-global aphasia, confusion  global aphasia, confusion - no relapse  Doppler (-) B  Card ECHO ordered   Urinary urgency 10/02/2010   Chronic  Dr Marget, Dr McDiarmid d/c. Seeing Dr Elisabeth  ?OAB  Trial of prn Ditropan  2022  Ditropan  po  Try estrocream vag 3/wk PV  On Macrobid qd  10/22 Memory loss is worse.  Likely aggravated by daily use of Ditropan .  She can switch to as needed use.  12/22 on Estrocream and Myrbetriq   Discussed anxiety issues. Pt declined meds, declined ref to psychology, psychiatry...  Try Valerian root for anxie   Varicose veins of lower extremities 07/13/2010    Past Surgical History:  Procedure Laterality Date   ABDOMINAL HYSTERECTOMY     ANTERIOR AND POSTERIOR REPAIR WITH SACROSPINOUS FIXATION N/A 06/05/2015   Procedure: ANTERIOR AND POSTERIOR REPAIR WITH SACROSPINOUS LIGAMENT SUSPENSION;  Surgeon: Alm Marget, MD;  Location: [facility];  Service: Gynecology;  Laterality: N/A;   CATARACT EXTRACTION, BILATERAL     lens implant   CHOLECYSTECTOMY  1991   CHOLECYSTECTOMY, LAPAROSCOPIC     Dr Merrilyn   COLONOSCOPY  2015   neg; due 2020   colonoscopy with polypectomy     Dr Rosalie; X 3   EYE SURGERY     trabeculectomy ;Dr Marci.Cataract removal OD; Dr Eliza   TOTAL HIP ARTHROPLASTY Right 09/22/2018   Procedure: TOTAL HIP ARTHROPLASTY ANTERIOR APPROACH;  Surgeon: Yvone Rush, MD;   Location: [facility];  Service: Orthopedics;  Laterality: Right;    Allergies  Allergen Reactions   Donepezil      diarrhea   Namenda  [Memantine ]     headache   Latex Rash    Red rash around red bandaid    Outpatient Encounter Medications as of 04/09/2024  Medication Sig   acetaminophen  (TYLENOL ) 325 MG tablet Take 650 mg by mouth every morning.   acetaminophen  (TYLENOL ) 500 MG tablet Take 1,000 mg by mouth every 12 (twelve) hours as needed.   ALPRAZolam  (XANAX ) 0.5 MG tablet Take 1 tablet (0.5 mg total) by mouth daily as  needed for anxiety.   aspirin  81 MG EC tablet Take 81 mg by mouth daily. Swallow whole.   brimonidine-timolol  (COMBIGAN) 0.2-0.5 % ophthalmic solution Place 1 drop into the left eye 2 (two) times daily.   cetirizine (ZYRTEC) 10 MG tablet Take 10 mg by mouth daily.   dextromethorphan-guaiFENesin (ROBITUSSIN-DM) 10-100 MG/5ML liquid Take 10 mLs by mouth every 6 (six) hours as needed for cough.   diclofenac Sodium (VOLTAREN) 1 % GEL Apply topically as needed.   dicyclomine  (BENTYL ) 10 MG capsule Take 1 capsule (10 mg total) by mouth every morning.   divalproex  (DEPAKOTE ) 125 MG DR tablet Take 125 mg by mouth daily at 6 (six) AM.   divalproex  (DEPAKOTE ) 250 MG DR tablet Take 250 mg by mouth daily. Daily in the evenings for aggressive behavior.   fluticasone (FLONASE) 50 MCG/ACT nasal spray Place 1 spray into both nostrils every 12 (twelve) hours as needed for allergies or rhinitis.   furosemide  (LASIX ) 20 MG tablet Take 1 tablet (20 mg total) by mouth daily. 40 mg daily for 3 days then 20 mg daily   ibuprofen  (ADVIL ) 200 MG tablet Take 200 mg by mouth every 6 (six) hours as needed.   latanoprost (XALATAN) 0.005 % ophthalmic solution Place 1 drop into the left eye at bedtime.   Polyethyl Glycol-Propyl Glycol 0.4-0.3 % SOLN Place 1-2 drops into both eyes daily as needed (for dry eyes).   polyethylene glycol (MIRALAX  / GLYCOLAX ) 17 g packet Take 17 g by mouth as needed.    polyethylene glycol powder (GLYCOLAX /MIRALAX ) 17 GM/SCOOP powder Take 17 g by mouth daily.   potassium chloride  SA (KLOR-CON  M) 20 MEQ tablet Take 1 tablet (20 mEq total) by mouth daily.   pravastatin  (PRAVACHOL ) 10 MG tablet TAKE 1 TABLET BY MOUTH DAILY   rivastigmine  (EXELON ) 3 MG capsule Take 3 mg by mouth in the morning.   rivastigmine  (EXELON ) 3 MG capsule Take 2 capsules by mouth at bedtime.   senna (SENOKOT) 8.6 MG TABS tablet Take 2 tablets by mouth as needed for mild constipation.   sertraline  (ZOLOFT ) 50 MG tablet Take 50 mg by mouth daily.   traMADol  (ULTRAM ) 50 MG tablet Take by mouth every 8 (eight) hours as needed.   [DISCONTINUED] Biotin 5000 MCG TABS Take 1 tablet by mouth in the morning.   [DISCONTINUED] cholecalciferol (VITAMIN D ) 1000 units tablet Take 2 tablets (2,000 Units total) by mouth daily.   [DISCONTINUED] cyanocobalamin  (VITAMIN B12) 1000 MCG tablet Take 1,000 mcg by mouth daily.   [DISCONTINUED] Biotin 5 MG TABS Take 1 tablet by mouth daily. Actually 5000 mcg -1 capsule daily (Patient not taking: Reported on 04/09/2024)   [DISCONTINUED] furosemide  (LASIX ) 20 MG tablet Take 2 tablets (40 mg total) by mouth every morning for 2 days. (Patient not taking: Reported on 04/09/2024)   [DISCONTINUED] potassium chloride  (KLOR-CON  M) 10 MEQ tablet Take 10 mEq by mouth in the morning. For edema. 2 days only (Patient not taking: Reported on 04/09/2024)   [DISCONTINUED] senna (SENOKOT) 8.6 MG tablet Take 1-2 tablets (8.6-17.2 mg total) by mouth daily as needed for constipation. (Patient not taking: Reported on 04/09/2024)   No facility-administered encounter medications on file as of 04/09/2024.    Review of Systems:  Review of Systems  Constitutional:  Positive for unexpected weight change. Negative for activity change, appetite change, chills, diaphoresis, fatigue and fever.  Respiratory:  Negative for cough, shortness of breath and wheezing.   Cardiovascular:  Negative for chest pain,  palpitations and leg swelling.  Skin:  Positive for color change and wound.  Psychiatric/Behavioral:  Positive for agitation, behavioral problems, confusion and dysphoric mood. Negative for sleep disturbance. The patient is nervous/anxious.     Health Maintenance  Topic Date Due   Influenza Vaccine  06/29/2024 (Originally 03/02/2024)   COVID-19 Vaccine (10 - Mixed Product risk 2024-25 season) 06/29/2024 (Originally 04/02/2024)   Medicare Annual Wellness (AWV)  09/13/2024   DTaP/Tdap/Td (3 - Td or Tdap) 06/25/2029   Pneumococcal Vaccine: 50+ Years  Completed   DEXA SCAN  Completed   Hepatitis C Screening  Completed   Zoster Vaccines- Shingrix  Completed   HPV VACCINES  Aged Out   Meningococcal B Vaccine  Aged Out   Colonoscopy  Discontinued    Physical Exam: Vitals:   04/09/24 0846  BP: 126/62  Pulse: 91  Temp: 98.1 F (36.7 C)  SpO2: 95%  Weight: 216 lb 9.6 oz (98.2 kg)  Height: 6' 2 (1.88 m)   Body mass index is 27.81 kg/m.       Physical Exam Vitals reviewed.  Constitutional:      Appearance: Normal appearance.  Cardiovascular:     Rate and Rhythm: Normal rate and regular rhythm.  Pulmonary:     Effort: Pulmonary effort is normal.     Breath sounds: Normal breath sounds.  Musculoskeletal:     Right lower leg: Edema present.     Left lower leg: Edema present.  Neurological:     General: No focal deficit present.     Mental Status: She is alert. Mental status is at baseline.  Psychiatric:     Comments: Anxious, easily angered     Labs reviewed: Basic Metabolic Panel: Recent Labs    05/16/23 1408 10/04/23 1612 01/26/24 0000 02/15/24 0000 04/03/24 0000 04/05/24 0000  NA 142 142   < > 137 144 145  K 4.1 4.4   < > 3.9 4.3 3.9  CL 106 106   < > 102 108 105  CO2 29 30   < > 23* 25* 25*  GLUCOSE 100* 98  --   --   --   --   BUN 28* 25*   < > 23* 25* 27*  CREATININE 0.80 0.81   < > 0.8 0.7 0.8  CALCIUM 10.1 10.0   < > 9.7 9.9 9.6  TSH  --  1.94  --    --  3.24  --    < > = values in this interval not displayed.   Liver Function Tests: Recent Labs    05/16/23 1408 10/04/23 1612 01/26/24 0000 04/03/24 0000  AST 16 15 20 21   ALT 14 11 12 14   ALKPHOS 50 53 63 63  BILITOT 0.8 0.6  --   --   PROT 6.9 7.3  --   --   ALBUMIN 4.1 4.3 4.1 4.0   No results for input(s): LIPASE, AMYLASE in the last 8760 hours. No results for input(s): AMMONIA in the last 8760 hours. CBC: Recent Labs    10/04/23 1612 01/26/24 0000  WBC 7.9 6.4  NEUTROABS 4.6  --   HGB 14.9 14.1  HCT 44.5 43  MCV 91.7  --   PLT 262.0 262   Lipid Panel: No results for input(s): CHOL, HDL, LDLCALC, TRIG, CHOLHDL, LDLDIRECT in the last 8760 hours. Lab Results  Component Value Date   HGBA1C 5.9 10/04/2023    Procedures since last visit: No results found.  Assessment/Plan Assessment and  Plan Assessment & Plan Bilateral lower extremity edema with blisters and skin breakdown due to chronic venous insufficiency and fluid retention Chronic edema with blisters and skin breakdown, primarily on the right leg, attributed to chronic venous insufficiency. Echocardiogram in 2020 ruled out heart failure. Current symptoms suggest venous insufficiency as the primary cause. Risks include infection and further skin breakdown. - Prescribed Lasix  (furosemide ) to offload fluid, consider torsemide if ineffective. - Encouraged leg elevation. - Recommended compression hose or wraps if tolerated. - Monitor for infection in blisters and skin breakdown. - Ordered BNP to assess for heart failure. - Monitor electrolytes periodically due to diuretic use. - Advised reducing salt and processed food intake.   Dementia with mood disturbance and adjustment issues MMSE 16/30 On exelon  also On zoloft  and depakote  Still mood is not adequate controlled Will add prn xanax  while waiting on further in put from Dr Tasia    Labs/tests ordered:  * No order type specified *  BMP BNP  Next appt:  04/30/2024   Total time :  time greater than 50% of total time spent doing pt counseling and coordination of care

## 2024-04-10 DIAGNOSIS — R413 Other amnesia: Secondary | ICD-10-CM | POA: Diagnosis not present

## 2024-04-10 DIAGNOSIS — R2681 Unsteadiness on feet: Secondary | ICD-10-CM | POA: Diagnosis not present

## 2024-04-10 DIAGNOSIS — R41841 Cognitive communication deficit: Secondary | ICD-10-CM | POA: Diagnosis not present

## 2024-04-11 ENCOUNTER — Encounter: Payer: Self-pay | Admitting: Physician Assistant

## 2024-04-11 DIAGNOSIS — R413 Other amnesia: Secondary | ICD-10-CM | POA: Diagnosis not present

## 2024-04-11 DIAGNOSIS — R601 Generalized edema: Secondary | ICD-10-CM | POA: Diagnosis not present

## 2024-04-11 DIAGNOSIS — R41841 Cognitive communication deficit: Secondary | ICD-10-CM | POA: Diagnosis not present

## 2024-04-11 DIAGNOSIS — R2681 Unsteadiness on feet: Secondary | ICD-10-CM | POA: Diagnosis not present

## 2024-04-12 DIAGNOSIS — F332 Major depressive disorder, recurrent severe without psychotic features: Secondary | ICD-10-CM | POA: Diagnosis not present

## 2024-04-13 ENCOUNTER — Non-Acute Institutional Stay: Payer: Self-pay | Admitting: Adult Health

## 2024-04-13 ENCOUNTER — Encounter: Payer: Self-pay | Admitting: Adult Health

## 2024-04-13 DIAGNOSIS — I872 Venous insufficiency (chronic) (peripheral): Secondary | ICD-10-CM

## 2024-04-13 DIAGNOSIS — L03115 Cellulitis of right lower limb: Secondary | ICD-10-CM | POA: Diagnosis not present

## 2024-04-13 MED ORDER — DOXYCYCLINE HYCLATE 100 MG PO TABS: 100.0000 mg | ORAL_TABLET | Freq: Two times a day (BID) | ORAL | Status: DC

## 2024-04-13 MED ORDER — REXULTI 0.5 MG PO TABS: 0.5000 mg | ORAL_TABLET | Freq: Every day | ORAL | Status: DC

## 2024-04-13 NOTE — Progress Notes (Signed)
 Location:  [facility]  POS: Clinic  Provider: Tawni America, ANP   Goals of Care:     02/14/2024    3:51 PM  Advanced Directives  Does Patient Have a Medical Advance Directive? Yes  Type of Advance Directive Living will;Healthcare Power of Attorney  Does patient want to make changes to medical advance directive? No - Patient declined  Copy of Healthcare Power of Attorney in Chart? Yes - validated most recent copy scanned in chart (See row information)     Chief Complaint  Patient presents with   Acute Visit    RLE redness     HPI:  Seen for RLE redness and pain around a blister area. (See picture)  She is currently being treated for venous insuff with lasix . Her edema has improved. Needs updated weight.   Reports pain to the RLE at the site of a blister with fluid leaking. Staff are wrapping the area with an abd pain and kerlix. No calf pain or redness.   Pro BNP ordered and WNL no signs of CHF such was sob on exertion or PND BMP     Past Medical History:  Diagnosis Date   Abdominal cramps 08/03/2022   Chronic abdominal cramps - periodic. Worse. Treat constipation.  (Pt saw Dr Rosalie, had an MRI).   Allergic rhinitis 08/03/2022   Chronic runny nose - Atrovent  did not work  Start Loratidine 10 mg/d   Arthritis    Atrophic vaginitis 04/29/2021   9/22  Dr Marget, dr Elisabeth  On estrocream vag 3/wk PV   Bilateral leg weakness 04/06/2013   Present since 2011-2012. She must push up from a chair   CAD (coronary artery disease) 04/29/2021   2021 CT coronary calcium score of 302.   Cont w/Simvastatin  - d/c, ASA  Cont on Pravastatin    Cataract    Both eyes have had lens replacement.   Constipation 08/03/2022   Chronic abdominal cramps - periodic. Worse.  (Pt saw Dr Rosalie, had an MRI).   Diverticulosis of colon 10/02/2010   Dyslipidemia 02/20/2008   Simvastatin      NMR Lipoprofile 2005: LDL particle # 1776/ small dense 1106 on Lipitor 20 mg daily.   MGF MI in 50s; father MI  @ 13.  No FH CVA  NMR 05/2011:LDL 78 (1169/542), HDL 41, TG 135. LDL goal < 100, ideally < 70.     [facility] panel: Hyperabsorber of  dietary cholesterol; Lp(a) 112; CRP 2.7     12/19 cardiac CT scan for calcium scoring offered     Cont on Pravastatin       Dysrhythmia    palpitations recently   Elevated blood pressure reading without diagnosis of hypertension 04/10/2010   Generalized anxiety disorder 07/29/2021   12/22  Discussed. Pt declined meds, declined ref to psychology, psychiatry... Try Valerian root for anxiety   Glaucoma 03/17/2011   Groin pain 03/26/2019   8/20 R - ?MSK vs other  R/o hernia  Gen surg ref vs CT discussed  06/2019 CT ok - Dr Rosalie  MSK strain   Heart murmur    History of colonic polyps 03/25/2009       Colonoscopy 2009: Adenomatous polyps, Dr Rosalie.   Repeated  2014      Hyperglycemia 02/20/2008      Paternal uncle had prediabetes     A1c 5.8 % in 7/12      Hyperlipidemia    LDL goal = < 100; Lp(a) 112; hyperabsorber   Hypotony of  right eye due to ocular fistula 06/20/2017   Liver lesion, right lobe 06/26/2019   06/2019 CT 3.5cm - Dr Magod  MRI stable 7/21   Mild cognitive impairment with memory loss 10/22/2020   Multiple lacunar infarcts [facility]) 06/2022   06/2022 MRI - Small chronic lacunar infarcts within the corona radiata, bilaterally.   Pigmentary glaucoma of both eyes, mild stage 04/09/2013   Plantar fasciitis, left 06/18/2009   Polycythemia, secondary 04/12/2016   Mild 2016   Pre-diabetes    per patient   Pseudophakia 06/23/2012   TIA (transient ischemic attack) 07/08/2019   12/20 probable-global aphasia, confusion  global aphasia, confusion - no relapse  Doppler (-) B  Card ECHO ordered   Urinary urgency 10/02/2010   Chronic  Dr Marget, Dr McDiarmid d/c. Seeing Dr Elisabeth  ?OAB  Trial of prn Ditropan  2022  Ditropan  po  Try estrocream vag 3/wk PV  On Macrobid qd  10/22 Memory loss is worse.  Likely aggravated by daily use of Ditropan .  She can switch to as  needed use.  12/22 on Estrocream and Myrbetriq   Discussed anxiety issues. Pt declined meds, declined ref to psychology, psychiatry...  Try Valerian root for anxie   Varicose veins of lower extremities 07/13/2010    Past Surgical History:  Procedure Laterality Date   ABDOMINAL HYSTERECTOMY     ANTERIOR AND POSTERIOR REPAIR WITH SACROSPINOUS FIXATION N/A 06/05/2015   Procedure: ANTERIOR AND POSTERIOR REPAIR WITH SACROSPINOUS LIGAMENT SUSPENSION;  Surgeon: Alm Marget, MD;  Location: [facility];  Service: Gynecology;  Laterality: N/A;   CATARACT EXTRACTION, BILATERAL     lens implant   CHOLECYSTECTOMY  1991   CHOLECYSTECTOMY, LAPAROSCOPIC     Dr Merrilyn   COLONOSCOPY  2015   neg; due 2020   colonoscopy with polypectomy     Dr Rosalie; X 3   EYE SURGERY     trabeculectomy ;Dr Marci.Cataract removal OD; Dr Eliza   TOTAL HIP ARTHROPLASTY Right 09/22/2018   Procedure: TOTAL HIP ARTHROPLASTY ANTERIOR APPROACH;  Surgeon: Yvone Rush, MD;  Location: [facility];  Service: Orthopedics;  Laterality: Right;    Allergies  Allergen Reactions   Donepezil      diarrhea   Namenda  [Memantine ]     headache   Latex Rash    Red rash around red bandaid    Outpatient Encounter Medications as of 04/13/2024  Medication Sig   acetaminophen  (TYLENOL ) 325 MG tablet Take 650 mg by mouth every morning.   acetaminophen  (TYLENOL ) 500 MG tablet Take 1,000 mg by mouth every 12 (twelve) hours as needed.   ALPRAZolam  (XANAX ) 0.5 MG tablet Take 1 tablet (0.5 mg total) by mouth daily as needed for anxiety.   aspirin  81 MG EC tablet Take 81 mg by mouth daily. Swallow whole.   Brexpiprazole  (REXULTI ) 0.5 MG TABS Take 1 tablet (0.5 mg total) by mouth daily.   brimonidine-timolol  (COMBIGAN) 0.2-0.5 % ophthalmic solution Place 1 drop into the left eye 2 (two) times daily.   cetirizine (ZYRTEC) 10 MG tablet Take 10 mg by mouth daily.   dextromethorphan-guaiFENesin (ROBITUSSIN-DM) 10-100 MG/5ML liquid Take 10 mLs by mouth every 6  (six) hours as needed for cough.   diclofenac Sodium (VOLTAREN) 1 % GEL Apply topically as needed.   dicyclomine  (BENTYL ) 10 MG capsule Take 1 capsule (10 mg total) by mouth every morning.   divalproex  (DEPAKOTE ) 250 MG DR tablet Take 250 mg by mouth 2 (two) times daily. Daily in the evenings for aggressive behavior.  fluticasone (FLONASE) 50 MCG/ACT nasal spray Place 1 spray into both nostrils every 12 (twelve) hours as needed for allergies or rhinitis.   furosemide  (LASIX ) 20 MG tablet Take 1 tablet (20 mg total) by mouth daily. 40 mg daily for 3 days then 20 mg daily   ibuprofen  (ADVIL ) 200 MG tablet Take 200 mg by mouth every 6 (six) hours as needed.   latanoprost (XALATAN) 0.005 % ophthalmic solution Place 1 drop into the left eye at bedtime.   Polyethyl Glycol-Propyl Glycol 0.4-0.3 % SOLN Place 1-2 drops into both eyes daily as needed (for dry eyes).   polyethylene glycol (MIRALAX  / GLYCOLAX ) 17 g packet Take 17 g by mouth as needed.   potassium chloride  SA (KLOR-CON  M) 20 MEQ tablet Take 1 tablet (20 mEq total) by mouth daily.   pravastatin  (PRAVACHOL ) 10 MG tablet TAKE 1 TABLET BY MOUTH DAILY   rivastigmine  (EXELON ) 3 MG capsule Take 3 mg by mouth in the morning.   rivastigmine  (EXELON ) 3 MG capsule Take 2 capsules by mouth at bedtime.   senna (SENOKOT) 8.6 MG TABS tablet Take 2 tablets by mouth as needed for mild constipation.   sertraline  (ZOLOFT ) 50 MG tablet Take 50 mg by mouth daily.   traMADol  (ULTRAM ) 50 MG tablet Take by mouth every 8 (eight) hours as needed.   [DISCONTINUED] divalproex  (DEPAKOTE ) 125 MG DR tablet Take 125 mg by mouth daily at 6 (six) AM.   [DISCONTINUED] polyethylene glycol powder (GLYCOLAX /MIRALAX ) 17 GM/SCOOP powder Take 17 g by mouth daily.   No facility-administered encounter medications on file as of 04/13/2024.    Review of Systems:  Review of Systems  Health Maintenance  Topic Date Due   Influenza Vaccine  06/29/2024 (Originally 03/02/2024)    COVID-19 Vaccine (10 - Mixed Product risk 2024-25 season) 06/29/2024 (Originally 04/02/2024)   Medicare Annual Wellness (AWV)  09/13/2024   DTaP/Tdap/Td (3 - Td or Tdap) 06/25/2029   Pneumococcal Vaccine: 50+ Years  Completed   DEXA SCAN  Completed   Hepatitis C Screening  Completed   Zoster Vaccines- Shingrix  Completed   HPV VACCINES  Aged Out   Meningococcal B Vaccine  Aged Out   Mammogram  Discontinued   Colonoscopy  Discontinued    Physical Exam: Vitals:   04/13/24 1154  BP: (!) 148/82  Pulse: 88  Resp: 18  Temp: 98.8 F (37.1 C)  SpO2: 99%   There is no height or weight on file to calculate BMI. Physical Exam  Labs reviewed: Basic Metabolic Panel: Recent Labs    05/16/23 1408 10/04/23 1612 01/26/24 0000 02/15/24 0000 04/03/24 0000 04/05/24 0000  NA 142 142   < > 137 144 145  K 4.1 4.4   < > 3.9 4.3 3.9  CL 106 106   < > 102 108 105  CO2 29 30   < > 23* 25* 25*  GLUCOSE 100* 98  --   --   --   --   BUN 28* 25*   < > 23* 25* 27*  CREATININE 0.80 0.81   < > 0.8 0.7 0.8  CALCIUM 10.1 10.0   < > 9.7 9.9 9.6  TSH  --  1.94  --   --  3.24  --    < > = values in this interval not displayed.   Liver Function Tests: Recent Labs    05/16/23 1408 10/04/23 1612 01/26/24 0000 04/03/24 0000  AST 16 15 20 21   ALT 14 11 12  14  ALKPHOS 50 53 63 63  BILITOT 0.8 0.6  --   --   PROT 6.9 7.3  --   --   ALBUMIN 4.1 4.3 4.1 4.0   No results for input(s): LIPASE, AMYLASE in the last 8760 hours. No results for input(s): AMMONIA in the last 8760 hours. CBC: Recent Labs    10/04/23 1612 01/26/24 0000  WBC 7.9 6.4  NEUTROABS 4.6  --   HGB 14.9 14.1  HCT 44.5 43  MCV 91.7  --   PLT 262.0 262   Lipid Panel: No results for input(s): CHOL, HDL, LDLCALC, TRIG, CHOLHDL, LDLDIRECT in the last 8760 hours. Lab Results  Component Value Date   HGBA1C 5.9 10/04/2023    Procedures since last visit: No results found.  Assessment/Plan  1. Cellulitis of  right lower extremity (Primary)  - doxycycline  (VIBRA -TABS) 100 MG tablet; Take 1 tablet (100 mg total) by mouth 2 (two) times daily.  -wound care nurse to follow blister area with redness to RLE.   2. Venous insufficiency Improving edema with lasix  BMP 2 weeks Weekly weights x 6   Labs/tests ordered:  * No order type specified *B MP Next appt:  04/30/2024   Total time :  time greater than 50% of total time spent doing pt counseling and coordination of care

## 2024-04-16 DIAGNOSIS — Z780 Asymptomatic menopausal state: Secondary | ICD-10-CM | POA: Diagnosis not present

## 2024-04-16 DIAGNOSIS — Z1231 Encounter for screening mammogram for malignant neoplasm of breast: Secondary | ICD-10-CM | POA: Diagnosis not present

## 2024-04-16 DIAGNOSIS — Z13820 Encounter for screening for osteoporosis: Secondary | ICD-10-CM | POA: Diagnosis not present

## 2024-04-16 LAB — HM DEXA SCAN: HM Dexa Scan: NORMAL

## 2024-04-16 LAB — HM MAMMOGRAPHY

## 2024-04-17 DIAGNOSIS — R2681 Unsteadiness on feet: Secondary | ICD-10-CM | POA: Diagnosis not present

## 2024-04-17 DIAGNOSIS — R413 Other amnesia: Secondary | ICD-10-CM | POA: Diagnosis not present

## 2024-04-17 DIAGNOSIS — R41841 Cognitive communication deficit: Secondary | ICD-10-CM | POA: Diagnosis not present

## 2024-04-19 DIAGNOSIS — R2681 Unsteadiness on feet: Secondary | ICD-10-CM | POA: Diagnosis not present

## 2024-04-19 DIAGNOSIS — R413 Other amnesia: Secondary | ICD-10-CM | POA: Diagnosis not present

## 2024-04-19 DIAGNOSIS — R41841 Cognitive communication deficit: Secondary | ICD-10-CM | POA: Diagnosis not present

## 2024-04-20 DIAGNOSIS — R2681 Unsteadiness on feet: Secondary | ICD-10-CM | POA: Diagnosis not present

## 2024-04-20 DIAGNOSIS — R41841 Cognitive communication deficit: Secondary | ICD-10-CM | POA: Diagnosis not present

## 2024-04-20 DIAGNOSIS — R413 Other amnesia: Secondary | ICD-10-CM | POA: Diagnosis not present

## 2024-04-24 ENCOUNTER — Encounter: Payer: Self-pay | Admitting: Orthopedic Surgery

## 2024-04-24 ENCOUNTER — Encounter: Payer: Self-pay | Admitting: Family Medicine

## 2024-04-24 ENCOUNTER — Non-Acute Institutional Stay: Payer: Self-pay | Admitting: Orthopedic Surgery

## 2024-04-24 ENCOUNTER — Ambulatory Visit: Payer: Self-pay | Admitting: Family Medicine

## 2024-04-24 DIAGNOSIS — F03918 Unspecified dementia, unspecified severity, with other behavioral disturbance: Secondary | ICD-10-CM | POA: Diagnosis not present

## 2024-04-24 DIAGNOSIS — F411 Generalized anxiety disorder: Secondary | ICD-10-CM | POA: Diagnosis not present

## 2024-04-24 DIAGNOSIS — L03115 Cellulitis of right lower limb: Secondary | ICD-10-CM

## 2024-04-24 DIAGNOSIS — R413 Other amnesia: Secondary | ICD-10-CM | POA: Diagnosis not present

## 2024-04-24 DIAGNOSIS — I872 Venous insufficiency (chronic) (peripheral): Secondary | ICD-10-CM

## 2024-04-24 DIAGNOSIS — R2681 Unsteadiness on feet: Secondary | ICD-10-CM | POA: Diagnosis not present

## 2024-04-24 DIAGNOSIS — R41841 Cognitive communication deficit: Secondary | ICD-10-CM | POA: Diagnosis not present

## 2024-04-24 NOTE — Progress Notes (Signed)
 Location:  [facility] Room Number: 615/A Place of Service:  ALF [phone]) Provider:  Greig FORBES Cluster, NP   Charlanne Fredia CROME, MD  Patient Care Team: Charlanne Fredia CROME, MD as PCP - General (Internal Medicine) Rosalie Kitchens, MD as Consulting Physician (Gastroenterology) Marget Lenis, MD as Consulting Physician (Obstetrics and Gynecology) Harvey Seltzer, MD as Consulting Physician (Sports Medicine) Yvone Rush, MD as Consulting Physician (Orthopedic Surgery) Gaston Hamilton, MD as Consulting Physician (Urology) Elisabeth Valli BIRCH, MD as Consulting Physician (Urology) Dina Camie FORBES DEVONNA (Neurology) Bond, Reyes Mcardle, MD as Referring Physician (Ophthalmology)  Extended Emergency Contact Information Primary Emergency Contact: [facility] [place] Home Phone: [phone] Mobile Phone: [phone] Relation: Other Secondary Emergency Contact: Diehl,Carol Address: [address]          [place], [place] [address] [place] Home Phone: [phone] Mobile Phone: [phone] Relation: Sister  Code Status:  Full code  Goals of care: Advanced Directive information    02/14/2024    3:51 PM  Advanced Directives  Does Patient Have a Medical Advance Directive? Yes  Type of Advance Directive Living will;Healthcare Power of Attorney  Does patient want to make changes to medical advance directive? No - Patient declined  Copy of Healthcare Power of Attorney in Chart? Yes - validated most recent copy scanned in chart (See row information)     Chief Complaint  Patient presents with   Acute Visit    Increased anxiety    HPI:  Pt is a 78 y.o. female seen today for acute visit due to increased anxiety.   She currently resides on the assisted living unit at [facility]. PMH: CAD, TIA, HLD, diverticulosis, AD, arthritis, anxiety, constipation and urinary urgency/incontinence.   Poor historian due to dementia. Rexulti  discontinued due  to cost. 09/22 Dr. Tasia started risperidone for behaviors. She continues to have periods of agitation towards staff. She will refuse care/ medications, yell at staff or throw objects in her room. She has been given xanax  prn for behaviors without success. She is also taking Depakote , Exelon  patch and zoloft .   Ongoing chronic venous insufficiency. RLE> LLE. 09/08 started on daily furosemide . Swelling to feet have improved. Repeat bmp scheduled 09/26.   RLE cellulitis with improved with doxycycline .      Past Medical History:  Diagnosis Date   Abdominal cramps 08/03/2022   Chronic abdominal cramps - periodic. Worse. Treat constipation.  (Pt saw Dr Rosalie, had an MRI).   Allergic rhinitis 08/03/2022   Chronic runny nose - Atrovent  did not work  Start Loratidine 10 mg/d   Arthritis    Atrophic vaginitis 04/29/2021   9/22  Dr Marget, dr Elisabeth  On estrocream vag 3/wk PV   Bilateral leg weakness 04/06/2013   Present since 2011-2012. She must push up from a chair   CAD (coronary artery disease) 04/29/2021   2021 CT coronary calcium score of 302.   Cont w/Simvastatin  - d/c, ASA  Cont on Pravastatin    Cataract    Both eyes have had lens replacement.   Constipation 08/03/2022   Chronic abdominal cramps - periodic. Worse.  (Pt saw Dr Rosalie, had an MRI).   Diverticulosis of colon 10/02/2010   Dyslipidemia 02/20/2008   Simvastatin      NMR Lipoprofile 2005: LDL particle # 1776/ small dense 1106 on Lipitor 20 mg daily.   MGF MI in 89s; father MI @ 35.  No FH CVA  NMR 05/2011:LDL 78 (1169/542), HDL 41, TG 135.  LDL goal < 100, ideally < 70.     [facility] panel: Hyperabsorber of  dietary cholesterol; Lp(a) 112; CRP 2.7     12/19 cardiac CT scan for calcium scoring offered     Cont on Pravastatin       Dysrhythmia    palpitations recently   Elevated blood pressure reading without diagnosis of hypertension 04/10/2010   Generalized anxiety disorder 07/29/2021   12/22  Discussed. Pt declined meds,  declined ref to psychology, psychiatry... Try Valerian root for anxiety   Glaucoma 03/17/2011   Groin pain 03/26/2019   8/20 R - ?MSK vs other  R/o hernia  Gen surg ref vs CT discussed  06/2019 CT ok - Dr Rosalie  MSK strain   Heart murmur    History of colonic polyps 03/25/2009       Colonoscopy 2009: Adenomatous polyps, Dr Rosalie.   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   Pigmentary glaucoma of both eyes, mild stage 04/09/2013   Plantar fasciitis, left 06/18/2009   Polycythemia, secondary 04/12/2016   Mild 2016   Pre-diabetes    per patient   Pseudophakia 06/23/2012   TIA (transient ischemic attack) 07/08/2019   12/20 probable-global aphasia, confusion  global aphasia, confusion - no relapse  Doppler (-) B  Card ECHO ordered   Urinary urgency 10/02/2010   Chronic  Dr Marget, Dr McDiarmid d/c. Seeing Dr Elisabeth  ?OAB  Trial of prn Ditropan  2022  Ditropan  po  Try estrocream vag 3/wk PV  On Macrobid qd  10/22 Memory loss is worse.  Likely aggravated by daily use of Ditropan .  She can switch to as needed use.  12/22 on Estrocream and Myrbetriq   Discussed anxiety issues. Pt declined meds, declined ref to psychology, psychiatry...  Try Valerian root for anxie   Varicose veins of lower extremities 07/13/2010   Past Surgical History:  Procedure Laterality Date   ABDOMINAL HYSTERECTOMY     ANTERIOR AND POSTERIOR REPAIR WITH SACROSPINOUS FIXATION N/A 06/05/2015   Procedure: ANTERIOR AND POSTERIOR REPAIR WITH SACROSPINOUS LIGAMENT SUSPENSION;   Surgeon: Alm Marget, MD;  Location: [facility];  Service: Gynecology;  Laterality: N/A;   CATARACT EXTRACTION, BILATERAL     lens implant   CHOLECYSTECTOMY  1991   CHOLECYSTECTOMY, LAPAROSCOPIC     Dr Merrilyn   COLONOSCOPY  2015   neg; due 2020   colonoscopy with polypectomy     Dr Rosalie; X 3   EYE SURGERY     trabeculectomy ;Dr Marci.Cataract removal OD; Dr Eliza   TOTAL HIP ARTHROPLASTY Right 09/22/2018   Procedure: TOTAL HIP ARTHROPLASTY ANTERIOR APPROACH;  Surgeon: Yvone Rush, MD;  Location: [facility];  Service: Orthopedics;  Laterality: Right;    Allergies  Allergen Reactions   Donepezil      diarrhea   Namenda  [Memantine ]     headache   Latex Rash    Red rash around red bandaid  Outpatient Encounter Medications as of 04/24/2024  Medication Sig   acetaminophen  (TYLENOL ) 325 MG tablet Take 650 mg by mouth every morning.   acetaminophen  (TYLENOL ) 500 MG tablet Take 1,000 mg by mouth every 12 (twelve) hours as needed.   ALPRAZolam  (XANAX ) 0.5 MG tablet Take 1 tablet (0.5 mg total) by mouth daily as needed for anxiety.   aspirin  81 MG EC tablet Take 81 mg by mouth daily. Swallow whole.   Brexpiprazole  (REXULTI ) 0.5 MG TABS Take 1 tablet (0.5 mg total) by mouth daily.   brimonidine-timolol  (COMBIGAN) 0.2-0.5 % ophthalmic solution Place 1 drop into the left eye 2 (two) times daily.   cetirizine (ZYRTEC) 10 MG tablet Take 10 mg by mouth daily.   dextromethorphan-guaiFENesin (ROBITUSSIN-DM) 10-100 MG/5ML liquid Take 10 mLs by mouth every 6 (six) hours as needed for cough.   diclofenac Sodium (VOLTAREN) 1 % GEL Apply topically as needed.   dicyclomine  (BENTYL ) 10 MG capsule Take 1 capsule (10 mg total) by mouth every morning.   divalproex  (DEPAKOTE ) 250 MG DR tablet Take 250 mg by mouth 2 (two) times daily. Daily in the evenings for aggressive behavior.   doxycycline  (VIBRA -TABS) 100 MG tablet Take 1 tablet (100 mg total) by mouth 2 (two) times daily.   fluticasone (FLONASE) 50  MCG/ACT nasal spray Place 1 spray into both nostrils every 12 (twelve) hours as needed for allergies or rhinitis.   furosemide  (LASIX ) 20 MG tablet Take 1 tablet (20 mg total) by mouth daily. 40 mg daily for 3 days then 20 mg daily   ibuprofen  (ADVIL ) 200 MG tablet Take 200 mg by mouth every 6 (six) hours as needed.   latanoprost (XALATAN) 0.005 % ophthalmic solution Place 1 drop into the left eye at bedtime.   Polyethyl Glycol-Propyl Glycol 0.4-0.3 % SOLN Place 1-2 drops into both eyes daily as needed (for dry eyes).   polyethylene glycol (MIRALAX  / GLYCOLAX ) 17 g packet Take 17 g by mouth as needed.   potassium chloride  SA (KLOR-CON  M) 20 MEQ tablet Take 1 tablet (20 mEq total) by mouth daily.   pravastatin  (PRAVACHOL ) 10 MG tablet TAKE 1 TABLET BY MOUTH DAILY   rivastigmine  (EXELON ) 3 MG capsule Take 3 mg by mouth in the morning.   rivastigmine  (EXELON ) 3 MG capsule Take 2 capsules by mouth at bedtime.   senna (SENOKOT) 8.6 MG TABS tablet Take 2 tablets by mouth as needed for mild constipation.   sertraline  (ZOLOFT ) 50 MG tablet Take 50 mg by mouth daily.   traMADol  (ULTRAM ) 50 MG tablet Take by mouth every 8 (eight) hours as needed.   No facility-administered encounter medications on file as of 04/24/2024.    Review of Systems  Unable to perform ROS: Dementia    Immunization History  Administered Date(s) Administered    sv, Bivalent, Protein Subunit Rsvpref,pf Marlow) 08/11/2022   Fluad  Quad(high Dose 65+) 03/26/2019, 04/23/2020, 04/29/2021, 05/03/2022, 05/10/2023   Fluad  Trivalent(High Dose 65+) 05/05/2023   INFLUENZA, HIGH DOSE SEASONAL PF 04/11/2014, 04/12/2016, 04/13/2017   Influenza Whole 05/02/2012, 05/02/2013   Influenza-Unspecified 04/27/2015, 05/12/2018   Moderna SARS-COV2 Booster Vaccination 06/17/2020, 12/18/2020   Moderna Sars-Covid-2 Vaccination 08/14/2019, 09/12/2019   Pneumococcal Conjugate-13 04/11/2015   Pneumococcal Polysaccharide-23 03/17/2011   Td 02/20/2008    Tdap 06/26/2019   Unspecified SARS-COV-2 Vaccination 08/14/2019, 09/12/2019, 06/17/2020, 12/18/2020, 06/02/2022, 11/25/2022, 05/05/2023   Zoster Recombinant(Shingrix) 09/08/2017, 11/24/2017   Zoster, Live 08/02/2010   Pertinent  Health Maintenance Due  Topic Date Due   Influenza Vaccine  06/29/2024 (Originally 03/02/2024)   DEXA SCAN  Completed   Mammogram  Discontinued   Colonoscopy  Discontinued      09/06/2023    1:01 PM 09/14/2023    2:16 PM 12/28/2023    3:44 PM 02/27/2024    3:05 PM 04/03/2024    6:54 PM  Fall Risk  Falls in the past year? 0 0 0 0 0  Was there an injury with Fall? 0 0 0 0 0  Fall Risk Category Calculator 0 0 0 0 0  Patient at Risk for Falls Due to  No Fall Risks   History of fall(s)  Fall risk Follow up Falls evaluation completed Falls evaluation completed;Falls prevention discussed Falls evaluation completed Falls evaluation completed Falls evaluation completed   Functional Status Survey:    Vitals:   04/24/24 1000  BP: 115/72  Pulse: (!) 58  Resp: 18  Temp: 97.7 F (36.5 C)  SpO2: 98%  Weight: 213 lb (96.6 kg)  Height: 6' 2 (1.88 m)   Body mass index is 27.35 kg/m. Physical Exam Vitals reviewed.  Constitutional:      General: She is not in acute distress. HENT:     Head: Normocephalic.  Eyes:     General:        Right eye: No discharge.        Left eye: No discharge.  Cardiovascular:     Rate and Rhythm: Normal rate and regular rhythm.     Pulses: Normal pulses.     Heart sounds: Normal heart sounds.  Pulmonary:     Effort: Pulmonary effort is normal.     Breath sounds: Normal breath sounds.  Abdominal:     Palpations: Abdomen is soft.  Musculoskeletal:     Cervical back: Neck supple.     Right lower leg: Edema present.     Left lower leg: Edema present.     Comments: RLE 1+ pitting, LLE non pitting  Skin:    General: Skin is warm.     Capillary Refill: Capillary refill takes less than 2 seconds.     Findings: Lesion present.      Comments: RLE wound scabbed over, no increased tenderness, erythema or swelling, drainage serous on bandage  Neurological:     General: No focal deficit present.     Mental Status: She is alert. Mental status is at baseline.  Psychiatric:        Mood and Affect: Mood is anxious.     Labs reviewed: Recent Labs    05/16/23 1408 10/04/23 1612 01/26/24 0000 02/15/24 0000 04/03/24 0000 04/05/24 0000  NA 142 142   < > 137 144 145  K 4.1 4.4   < > 3.9 4.3 3.9  CL 106 106   < > 102 108 105  CO2 29 30   < > 23* 25* 25*  GLUCOSE 100* 98  --   --   --   --   BUN 28* 25*   < > 23* 25* 27*  CREATININE 0.80 0.81   < > 0.8 0.7 0.8  CALCIUM 10.1 10.0   < > 9.7 9.9 9.6   < > = values in this interval not displayed.   Recent Labs    05/16/23 1408 10/04/23 1612 01/26/24 0000 04/03/24 0000  AST 16 15 20 21   ALT 14 11 12 14   ALKPHOS 50 53 63 63  BILITOT 0.8 0.6  --   --   PROT 6.9 7.3  --   --  ALBUMIN 4.1 4.3 4.1 4.0   Recent Labs    10/04/23 1612 01/26/24 0000  WBC 7.9 6.4  NEUTROABS 4.6  --   HGB 14.9 14.1  HCT 44.5 43  MCV 91.7  --   PLT 262.0 262   Lab Results  Component Value Date   TSH 3.24 04/03/2024   Lab Results  Component Value Date   HGBA1C 5.9 10/04/2023   Lab Results  Component Value Date   CHOL 169 01/28/2022   HDL 54.40 01/28/2022   LDLCALC 89 01/28/2022   LDLDIRECT 85.6 02/20/2008   TRIG 127.0 01/28/2022   CHOLHDL 3 01/28/2022    Significant Diagnostic Results in last 30 days:  No results found.  Assessment/Plan 1. Generalized anxiety disorder (Primary) - ongoing - unsuccessful trial xanax  prn with in creased behaviors - increase zoloft  to 75 mg daily  - con xanax  daily prn   2. Chronic venous insufficiency of lower extremity - ongoing - R>L - cont furosemide  daily  - repeat BMP scheduled 09/26  3. Cellulitis of right lower extremity - improved - off doxycycline  - scab covering wound intact, serous drainage - cont to cover with  gauze until healed  4. Dementia with behavioral disturbance (HCC) - followed by Dr. Tasia - off Rexulti  due to cost - 09/22 Risperdal started at bedtime per Dr. Tasia - cont Exleon and depakote     Family/ staff Communication: plan discussed with patient and nurse  Labs/tests ordered:  bmp 09/26

## 2024-04-25 DIAGNOSIS — H401331 Pigmentary glaucoma, bilateral, mild stage: Secondary | ICD-10-CM | POA: Diagnosis not present

## 2024-04-26 DIAGNOSIS — R41841 Cognitive communication deficit: Secondary | ICD-10-CM | POA: Diagnosis not present

## 2024-04-26 DIAGNOSIS — R413 Other amnesia: Secondary | ICD-10-CM | POA: Diagnosis not present

## 2024-04-26 DIAGNOSIS — R2681 Unsteadiness on feet: Secondary | ICD-10-CM | POA: Diagnosis not present

## 2024-04-27 DIAGNOSIS — G4751 Confusional arousals: Secondary | ICD-10-CM | POA: Diagnosis not present

## 2024-04-27 DIAGNOSIS — R8290 Unspecified abnormal findings in urine: Secondary | ICD-10-CM | POA: Diagnosis not present

## 2024-04-27 DIAGNOSIS — R609 Edema, unspecified: Secondary | ICD-10-CM | POA: Diagnosis not present

## 2024-04-27 LAB — BASIC METABOLIC PANEL WITH GFR
BUN: 34 — AB (ref 4–21)
CO2: 23 — AB (ref 13–22)
Chloride: 105 (ref 99–108)
Creatinine: 0.7 (ref 0.5–1.1)
Glucose: 81
Potassium: 4.2 meq/L (ref 3.5–5.1)
Sodium: 141 (ref 137–147)

## 2024-04-27 LAB — COMPREHENSIVE METABOLIC PANEL WITH GFR
Calcium: 9.2 (ref 8.7–10.7)
eGFR: 84

## 2024-04-28 DIAGNOSIS — R413 Other amnesia: Secondary | ICD-10-CM | POA: Diagnosis not present

## 2024-04-28 DIAGNOSIS — R41841 Cognitive communication deficit: Secondary | ICD-10-CM | POA: Diagnosis not present

## 2024-04-28 DIAGNOSIS — R2681 Unsteadiness on feet: Secondary | ICD-10-CM | POA: Diagnosis not present

## 2024-04-30 ENCOUNTER — Non-Acute Institutional Stay: Admitting: Adult Health

## 2024-04-30 ENCOUNTER — Encounter: Payer: Self-pay | Admitting: Adult Health

## 2024-04-30 VITALS — BP 114/68 | HR 76 | Temp 97.9°F | Ht 74.0 in | Wt 228.4 lb

## 2024-04-30 DIAGNOSIS — F03918 Unspecified dementia, unspecified severity, with other behavioral disturbance: Secondary | ICD-10-CM | POA: Diagnosis not present

## 2024-04-30 DIAGNOSIS — I872 Venous insufficiency (chronic) (peripheral): Secondary | ICD-10-CM

## 2024-04-30 MED ORDER — FUROSEMIDE 40 MG PO TABS: 40.0000 mg | ORAL_TABLET | Freq: Two times a day (BID) | ORAL | 3 refills | Status: DC

## 2024-04-30 NOTE — Progress Notes (Signed)
 Location:  [facility]  POS: Clinic  Provider: Tawni America, ANP   Goals of Care:     02/14/2024    3:51 PM  Advanced Directives  Does Patient Have a Medical Advance Directive? Yes  Type of Advance Directive Living will;Healthcare Power of Attorney  Does patient want to make changes to medical advance directive? No - Patient declined  Copy of Healthcare Power of Attorney in Chart? Yes - validated most recent copy scanned in chart (See row information)     Chief Complaint  Patient presents with   Follow-up    3 week follow up swelling of the legs.    HPI:  History of Present Illness Morgan Huff is a 78 year old female who presents with leg swelling and fluid leakage.  Lower extremity edema and fluid leakage - Persistent bilateral leg swelling with associated fluid leakage - Erythematous rash present present in the lower extremities - Right leg is wrapped with kerlix but swelling has not significantly improved - Weight gain attributed to fluid retention  Diuretic therapy and medication burden - Currently taking furosemide  (Lasix ), initially 40 mg for two days, then reduced to 20 mg daily - Initial improvement in symptoms with diuretic therapy, but no significant ongoing benefit  Functional status and activities of daily living - No dizziness or falls - Limited access to bathing facilities, relies on a large sink for personal hygiene  UA done which was negative 04/27/24 Completed due to ongoing agitation and dysphoric mood associated with dementia, also paranoid.  Seen by Dr Tasia, started on Risperdal 9/22 and Doxepin Could not take rexulti  due to cost Also recent increase in zoloft  9/23 She has prn xanax  ordered by has used sparingly MMSE 16/30 Exelon  reduced due to concern for agitation   Has gained 12 lbs this month Wt Readings from Last 3 Encounters:  04/30/24 228 lb 6.4 oz (103.6 kg)  04/24/24 213 lb (96.6 kg)  04/09/24 216 lb 9.6 oz (98.2 kg)    She is not having any sob or chest pain.  Normal LFTs Normal BUn/Cr Pro BNP normal  Past Medical History:  Diagnosis Date   Abdominal cramps 08/03/2022   Chronic abdominal cramps - periodic. Worse. Treat constipation.  (Pt saw Dr Rosalie, had an MRI).   Allergic rhinitis 08/03/2022   Chronic runny nose - Atrovent  did not work  Start Loratidine 10 mg/d   Arthritis    Atrophic vaginitis 04/29/2021   9/22  Dr Marget, dr Elisabeth  On estrocream vag 3/wk PV   Bilateral leg weakness 04/06/2013   Present since 2011-2012. She must push up from a chair   CAD (coronary artery disease) 04/29/2021   2021 CT coronary calcium score of 302.   Cont w/Simvastatin  - d/c, ASA  Cont on Pravastatin    Cataract    Both eyes have had lens replacement.   Constipation 08/03/2022   Chronic abdominal cramps - periodic. Worse.  (Pt saw Dr Rosalie, had an MRI).   Diverticulosis of colon 10/02/2010   Dyslipidemia 02/20/2008   Simvastatin      NMR Lipoprofile 2005: LDL particle # 1776/ small dense 1106 on Lipitor 20 mg daily.   MGF MI in 80s; father MI @ 69.  No FH CVA  NMR 05/2011:LDL 78 (1169/542), HDL 41, TG 135. LDL goal < 100, ideally < 70.     [facility] panel: Hyperabsorber of  dietary cholesterol; Lp(a) 112; CRP 2.7     12/19 cardiac CT scan for calcium scoring  offered     Cont on Pravastatin       Dysrhythmia    palpitations recently   Elevated blood pressure reading without diagnosis of hypertension 04/10/2010   Generalized anxiety disorder 07/29/2021   12/22  Discussed. Pt declined meds, declined ref to psychology, psychiatry... Try Valerian root for anxiety   Glaucoma 03/17/2011   Groin pain 03/26/2019   8/20 R - ?MSK vs other  R/o hernia  Gen surg ref vs CT discussed  06/2019 CT ok - Dr Rosalie  MSK strain   Heart murmur    History of colonic polyps 03/25/2009       Colonoscopy 2009: Adenomatous polyps, Dr Rosalie.   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   Pigmentary glaucoma of both eyes, mild stage 04/09/2013   Plantar fasciitis, left 06/18/2009   Polycythemia, secondary 04/12/2016   Mild 2016   Pre-diabetes    per patient   Pseudophakia 06/23/2012   TIA (transient ischemic attack) 07/08/2019   12/20 probable-global aphasia, confusion  global aphasia, confusion - no relapse  Doppler (-) B  Card ECHO ordered   Urinary urgency 10/02/2010   Chronic  Dr Marget, Dr McDiarmid d/c. Seeing Dr Elisabeth  ?OAB  Trial of prn Ditropan  2022  Ditropan  po  Try estrocream vag 3/wk PV  On Macrobid qd  10/22 Memory loss is worse.  Likely aggravated by daily use of Ditropan .  She can switch to as needed use.  12/22 on Estrocream and Myrbetriq   Discussed anxiety issues. Pt declined meds, declined ref to psychology, psychiatry...  Try Valerian root for anxie   Varicose veins of lower extremities 07/13/2010    Past Surgical History:  Procedure Laterality Date   ABDOMINAL HYSTERECTOMY     ANTERIOR AND POSTERIOR REPAIR WITH SACROSPINOUS FIXATION N/A 06/05/2015   Procedure: ANTERIOR AND POSTERIOR REPAIR WITH SACROSPINOUS LIGAMENT SUSPENSION;  Surgeon: Alm Marget, MD;  Location: [facility];  Service: Gynecology;  Laterality: N/A;   CATARACT EXTRACTION, BILATERAL     lens implant   CHOLECYSTECTOMY  1991   CHOLECYSTECTOMY, LAPAROSCOPIC     Dr Merrilyn   COLONOSCOPY  2015   neg; due 2020   colonoscopy with polypectomy     Dr Rosalie; X 3   EYE SURGERY     trabeculectomy ;Dr Marci.Cataract removal OD; Dr Eliza   TOTAL HIP ARTHROPLASTY Right 09/22/2018   Procedure: TOTAL HIP  ARTHROPLASTY ANTERIOR APPROACH;  Surgeon: Yvone Rush, MD;  Location: [facility];  Service: Orthopedics;  Laterality: Right;    Allergies  Allergen Reactions   Donepezil      diarrhea   Namenda  [Memantine ]     headache   Latex Rash    Red rash around red bandaid    Outpatient Encounter Medications as of 04/30/2024  Medication Sig   acetaminophen  (TYLENOL ) 325 MG tablet Take 650 mg by mouth every morning.   acetaminophen  (TYLENOL ) 500 MG tablet Take 1,000 mg  by mouth every 12 (twelve) hours as needed.   ALPRAZolam  (XANAX ) 0.5 MG tablet Take 1 tablet (0.5 mg total) by mouth daily as needed for anxiety.   aspirin  81 MG EC tablet Take 81 mg by mouth daily. Swallow whole.   brimonidine-timolol  (COMBIGAN) 0.2-0.5 % ophthalmic solution Place 1 drop into the left eye 2 (two) times daily.   cetirizine (ZYRTEC) 10 MG tablet Take 10 mg by mouth daily.   dextromethorphan-guaiFENesin (ROBITUSSIN-DM) 10-100 MG/5ML liquid Take 10 mLs by mouth every 6 (six) hours as needed for cough.   diclofenac Sodium (VOLTAREN) 1 % GEL Apply topically as needed.   dicyclomine  (BENTYL ) 10 MG capsule Take 1 capsule (10 mg total) by mouth every morning.   divalproex  (DEPAKOTE ) 250 MG DR tablet Take 250 mg by mouth 2 (two) times daily. Daily in the evenings for aggressive behavior.   doxepin (SINEQUAN) 10 MG capsule Take 10 mg by mouth at bedtime.  Give 1 capsule by mouth at bedtime for insomnia   fluticasone (FLONASE) 50 MCG/ACT nasal spray Place 1 spray into both nostrils every 12 (twelve) hours as needed for allergies or rhinitis.   furosemide  (LASIX ) 20 MG tablet Take 1 tablet (20 mg total) by mouth daily. 40 mg daily for 3 days then 20 mg daily   ibuprofen  (ADVIL ) 200 MG tablet Take 200 mg by mouth every 6 (six) hours as needed.   latanoprost (XALATAN) 0.005 % ophthalmic solution Place 1 drop into the left eye at bedtime.   Polyethyl Glycol-Propyl Glycol 0.4-0.3 % SOLN Place 1-2 drops into both eyes daily as needed (for  dry eyes).   polyethylene glycol (MIRALAX  / GLYCOLAX ) 17 g packet Take 17 g by mouth as needed.   potassium chloride  SA (KLOR-CON  M) 20 MEQ tablet Take 1 tablet (20 mEq total) by mouth daily.   pravastatin  (PRAVACHOL ) 10 MG tablet TAKE 1 TABLET BY MOUTH DAILY   risperiDONE (RISPERDAL) 0.25 MG tablet Take 0.5 mg by mouth 2 (two) times daily.  Give 0.5 mg by mouth two times a day related to DEMENTIA IN OTHER DISEASES CLASSIFIED ELSEWHERE, MODERATE, WITH AGITATION (F02.B11)   rivastigmine  (EXELON ) 3 MG capsule Take 4.5 mg by mouth 2 (two) times daily.   senna (SENOKOT) 8.6 MG TABS tablet Take 2 tablets by mouth as needed for mild constipation.   sertraline  (ZOLOFT ) 50 MG tablet Take 50 mg by mouth daily.   traMADol  (ULTRAM ) 50 MG tablet Take by mouth every 8 (eight) hours as needed.   rivastigmine  (EXELON ) 3 MG capsule Take 2 capsules by mouth at bedtime. (Patient not taking: Reported on 04/30/2024)   No facility-administered encounter medications on file as of 04/30/2024.    Review of Systems:  Review of Systems  Constitutional:  Positive for unexpected weight change. Negative for activity change, appetite change, chills, diaphoresis, fatigue and fever.  HENT:  Negative for congestion.   Respiratory:  Negative for cough, shortness of breath and wheezing.   Cardiovascular:  Positive for leg swelling. Negative for chest pain.  Gastrointestinal:  Negative for abdominal distention, abdominal pain, constipation, diarrhea, nausea and vomiting.  Genitourinary:  Negative for difficulty urinating, dysuria and urgency.  Musculoskeletal:  Negative for back pain, gait problem, myalgias and neck pain.  Skin:  Positive for color change and rash.  Neurological:  Negative for dizziness and weakness.  Psychiatric/Behavioral:  Positive for agitation, behavioral problems and confusion.     Health Maintenance  Topic Date Due   Influenza Vaccine  06/29/2024 (Originally 03/02/2024)  COVID-19 Vaccine (10 -  Mixed Product risk 2024-25 season) 06/29/2024 (Originally 04/02/2024)   Medicare Annual Wellness (AWV)  09/13/2024   DTaP/Tdap/Td (3 - Td or Tdap) 06/25/2029   Pneumococcal Vaccine: 50+ Years  Completed   DEXA SCAN  Completed   Hepatitis C Screening  Completed   Zoster Vaccines- Shingrix  Completed   HPV VACCINES  Aged Out   Meningococcal B Vaccine  Aged Out   Mammogram  Discontinued   Colonoscopy  Discontinued    Physical Exam: Vitals:   04/30/24 0840  BP: 114/68  Pulse: 76  Temp: 97.9 F (36.6 C)  SpO2: 98%  Weight: 228 lb 6.4 oz (103.6 kg)  Height: 6' 2 (1.88 m)   Body mass index is 29.32 kg/m. Physical Exam Vitals reviewed.  Constitutional:      Appearance: Normal appearance.  Cardiovascular:     Rate and Rhythm: Normal rate and regular rhythm.     Comments: 2/6 murmur Pulmonary:     Effort: Pulmonary effort is normal.     Breath sounds: Normal breath sounds.  Musculoskeletal:     Right lower leg: Edema present.     Left lower leg: Edema present.     Comments: Right lower shin area with circular wound with eschar, maceration surrounding the area   Skin:    General: Skin is warm and dry.     Findings: Erythema (maculopapular rash to BLE) present.  Neurological:     Mental Status: She is alert. Mental status is at baseline.  Psychiatric:     Comments: Easily angered      Labs reviewed: Basic Metabolic Panel: Recent Labs    05/16/23 1408 10/04/23 1612 01/26/24 0000 02/15/24 0000 04/03/24 0000 04/05/24 0000  NA 142 142   < > 137 144 145  K 4.1 4.4   < > 3.9 4.3 3.9  CL 106 106   < > 102 108 105  CO2 29 30   < > 23* 25* 25*  GLUCOSE 100* 98  --   --   --   --   BUN 28* 25*   < > 23* 25* 27*  CREATININE 0.80 0.81   < > 0.8 0.7 0.8  CALCIUM 10.1 10.0   < > 9.7 9.9 9.6  TSH  --  1.94  --   --  3.24  --    < > = values in this interval not displayed.   Liver Function Tests: Recent Labs    05/16/23 1408 10/04/23 1612 01/26/24 0000 04/03/24 0000   AST 16 15 20 21   ALT 14 11 12 14   ALKPHOS 50 53 63 63  BILITOT 0.8 0.6  --   --   PROT 6.9 7.3  --   --   ALBUMIN 4.1 4.3 4.1 4.0   No results for input(s): LIPASE, AMYLASE in the last 8760 hours. No results for input(s): AMMONIA in the last 8760 hours. CBC: Recent Labs    10/04/23 1612 01/26/24 0000  WBC 7.9 6.4  NEUTROABS 4.6  --   HGB 14.9 14.1  HCT 44.5 43  MCV 91.7  --   PLT 262.0 262   Lipid Panel: No results for input(s): CHOL, HDL, LDLCALC, TRIG, CHOLHDL, LDLDIRECT in the last 8760 hours. Lab Results  Component Value Date   HGBA1C 5.9 10/04/2023    Procedures since last visit: No results found.  Assessment/Plan Assessment and Plan Assessment & Plan Chronic lower extremity edema with skin breakdown and stasis dermatitis due  to chronic venous insufficiency Persistent edema with maceration and potential stasis dermatitis. Current diuretic therapy ineffective. Risk of cellulitis due to fluid retention and open skin areas. - Increase Lasix  to 40 mg for two days, then 40 mg daily. - Implement double layer compression wraps with more absorption capacity. - Educated on importance of compression wraps in reducing fluid retention. - Recommend proper hygiene  - Consider torsemide if current diuretic regimen remains ineffective. -2/6 systolic murmur, normal EF 08/09/2019   Dementia with behavioral disturbance Followed psych  Multiple medications with no clear benefit right now Needs more time work Concern for increased agitation Follow up in one month, if agitation has not improved will need further adjustment   Labs/tests ordered:  * No order type specified * CMP 1 week  Next appt:  Visit date not found   Total time :  time greater than 50% of total time spent doing pt counseling and coordination of care

## 2024-05-01 DIAGNOSIS — R41841 Cognitive communication deficit: Secondary | ICD-10-CM | POA: Diagnosis not present

## 2024-05-01 DIAGNOSIS — R413 Other amnesia: Secondary | ICD-10-CM | POA: Diagnosis not present

## 2024-05-01 DIAGNOSIS — R2681 Unsteadiness on feet: Secondary | ICD-10-CM | POA: Diagnosis not present

## 2024-05-03 ENCOUNTER — Non-Acute Institutional Stay: Payer: Self-pay | Admitting: Adult Health

## 2024-05-03 ENCOUNTER — Encounter: Payer: Self-pay | Admitting: Adult Health

## 2024-05-03 DIAGNOSIS — I872 Venous insufficiency (chronic) (peripheral): Secondary | ICD-10-CM

## 2024-05-03 MED ORDER — TRIAMCINOLONE ACETONIDE 0.1 % EX CREA: 1.0000 | TOPICAL_CREAM | Freq: Two times a day (BID) | CUTANEOUS | Status: AC

## 2024-05-03 NOTE — Progress Notes (Addendum)
 Location:  Medical illustrator of Service:  ALF (13) Provider:   Bari America, ANP [facility] [phone]   Charlanne Fredia CROME, MD  Patient Care Team: Charlanne Fredia CROME, MD as PCP - General (Internal Medicine) Rosalie Kitchens, MD as Consulting Physician (Gastroenterology) Marget Lenis, MD as Consulting Physician (Obstetrics and Gynecology) Harvey Seltzer, MD as Consulting Physician (Sports Medicine) Yvone Rush, MD as Consulting Physician (Orthopedic Surgery) Gaston Hamilton, MD as Consulting Physician (Urology) Elisabeth Valli BIRCH, MD as Consulting Physician (Urology) Wertman, Sara E, PA-C (Neurology) Bond, Reyes Mcardle, MD as Referring Physician (Ophthalmology)  Extended Emergency Contact Information Primary Emergency Contact: [facility] [place] Home Phone: [phone] Mobile Phone: [phone] Relation: Other Secondary Emergency Contact: Diehl,Carol Address: [address]          [place], [place] [address] [place] Home Phone: [phone] Mobile Phone: [phone] Relation: Sister  Goals of care: Advanced Directive information    02/14/2024    3:51 PM  Advanced Directives  Does Patient Have a Medical Advance Directive? Yes  Type of Advance Directive Living will;Healthcare Power of Attorney  Does patient want to make changes to medical advance directive? No - Patient declined  Copy of Healthcare Power of Attorney in Chart? Yes - validated most recent copy scanned in chart (See row information)     Chief Complaint  Patient presents with   Acute Visit    Rash to legs    HPI:  Pt is a 78 y.o. female seen today for an acute visit for an erythematous maculopapular rash to BLE This has been present for several days but slightly worse per the nurse today No itching No purulent matter No fever Eating and drinking well She has venous insuff and is using lasix . Mild improvement in swelling at this  time She is also using wraps to her legs She has dementia with behaviors and is on new medications which include risperdal, depakote , and doxepin.    Past Medical History:  Diagnosis Date   Abdominal cramps 08/03/2022   Chronic abdominal cramps - periodic. Worse. Treat constipation.  (Pt saw Dr Rosalie, had an MRI).   Allergic rhinitis 08/03/2022   Chronic runny nose - Atrovent  did not work  Start Loratidine 10 mg/d   Arthritis    Atrophic vaginitis 04/29/2021   9/22  Dr Marget, dr Elisabeth  On estrocream vag 3/wk PV   Bilateral leg weakness 04/06/2013   Present since 2011-2012. She must push up from a chair   CAD (coronary artery disease) 04/29/2021   2021 CT coronary calcium score of 302.   Cont w/Simvastatin  - d/c, ASA  Cont on Pravastatin    Cataract    Both eyes have had lens replacement.   Constipation 08/03/2022   Chronic abdominal cramps - periodic. Worse.  (Pt saw Dr Rosalie, had an MRI).   Diverticulosis of colon 10/02/2010   Dyslipidemia 02/20/2008   Simvastatin      NMR Lipoprofile 2005: LDL particle # 1776/ small dense 1106 on Lipitor 20 mg daily.   MGF MI in 18s; father MI @ 72.  No FH CVA  NMR 05/2011:LDL 78 (1169/542), HDL 41, TG 135. LDL goal < 100, ideally < 70.     [facility] panel: Hyperabsorber of  dietary cholesterol; Lp(a) 112; CRP 2.7     12/19 cardiac CT scan for calcium scoring offered     Cont on Pravastatin       Dysrhythmia    palpitations  recently   Elevated blood pressure reading without diagnosis of hypertension 04/10/2010   Generalized anxiety disorder 07/29/2021   12/22  Discussed. Pt declined meds, declined ref to psychology, psychiatry... Try Valerian root for anxiety   Glaucoma 03/17/2011   Groin pain 03/26/2019   8/20 R - ?MSK vs other  R/o hernia  Gen surg ref vs CT discussed  06/2019 CT ok - Dr Rosalie  MSK strain   Heart murmur    History of colonic polyps 03/25/2009       Colonoscopy 2009: Adenomatous polyps, Dr Rosalie.   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   Pigmentary glaucoma of both eyes, mild stage 04/09/2013   Plantar fasciitis, left 06/18/2009   Polycythemia, secondary 04/12/2016   Mild 2016   Pre-diabetes    per patient   Pseudophakia 06/23/2012   TIA (transient ischemic attack) 07/08/2019   12/20 probable-global aphasia, confusion  global aphasia, confusion - no relapse  Doppler (-) B  Card ECHO ordered   Urinary urgency 10/02/2010   Chronic  Dr Marget, Dr McDiarmid d/c. Seeing Dr Elisabeth  ?OAB  Trial of prn Ditropan  2022  Ditropan  po  Try estrocream vag 3/wk PV  On Macrobid qd  10/22 Memory loss is worse.  Likely aggravated by daily use of Ditropan .  She can switch to as needed use.  12/22 on Estrocream and Myrbetriq   Discussed anxiety issues. Pt declined meds, declined ref to psychology, psychiatry...  Try Valerian root for anxie   Varicose veins of lower extremities 07/13/2010   Past Surgical History:  Procedure Laterality Date   ABDOMINAL HYSTERECTOMY     ANTERIOR AND POSTERIOR REPAIR WITH SACROSPINOUS FIXATION N/A 06/05/2015   Procedure: ANTERIOR AND POSTERIOR REPAIR WITH SACROSPINOUS LIGAMENT SUSPENSION;  Surgeon: Alm Marget, MD;  Location: [facility];  Service: Gynecology;  Laterality: N/A;   CATARACT EXTRACTION, BILATERAL     lens implant   CHOLECYSTECTOMY  1991   CHOLECYSTECTOMY, LAPAROSCOPIC     Dr Merrilyn   COLONOSCOPY  2015   neg; due 2020   colonoscopy with polypectomy     Dr Rosalie; X 3   EYE SURGERY     trabeculectomy ;Dr Marci.Cataract removal OD; Dr  Eliza   TOTAL HIP ARTHROPLASTY Right 09/22/2018   Procedure: TOTAL HIP ARTHROPLASTY ANTERIOR APPROACH;  Surgeon: Yvone Rush, MD;  Location: [facility];  Service: Orthopedics;  Laterality: Right;    Allergies  Allergen Reactions   Donepezil      diarrhea   Namenda  [Memantine ]     headache   Latex Rash    Red rash around red bandaid    Outpatient Encounter Medications as of 05/03/2024  Medication Sig   triamcinolone cream (KENALOG) 0.1 % Apply 1 Application topically 2 (two) times daily for 10 days.   acetaminophen  (TYLENOL ) 325 MG tablet Take 650 mg by mouth every morning.   acetaminophen  (TYLENOL ) 500 MG tablet Take 1,000 mg by  mouth every 12 (twelve) hours as needed.   ALPRAZolam  (XANAX ) 0.5 MG tablet Take 1 tablet (0.5 mg total) by mouth daily as needed for anxiety.   aspirin  81 MG EC tablet Take 81 mg by mouth daily. Swallow whole.   brimonidine-timolol  (COMBIGAN) 0.2-0.5 % ophthalmic solution Place 1 drop into the left eye 2 (two) times daily.   cetirizine (ZYRTEC) 10 MG tablet Take 10 mg by mouth daily.   dextromethorphan-guaiFENesin (ROBITUSSIN-DM) 10-100 MG/5ML liquid Take 10 mLs by mouth every 6 (six) hours as needed for cough.   diclofenac Sodium (VOLTAREN) 1 % GEL Apply topically as needed.   dicyclomine  (BENTYL ) 10 MG capsule Take 1 capsule (10 mg total) by mouth every morning.   divalproex  (DEPAKOTE ) 250 MG DR tablet Take 250 mg by mouth 2 (two) times daily. Daily in the evenings for aggressive behavior.   doxepin (SINEQUAN) 10 MG capsule Take 10 mg by mouth at bedtime.  Give 1 capsule by mouth at bedtime for insomnia   fluticasone (FLONASE) 50 MCG/ACT nasal spray Place 1 spray into both nostrils every 12 (twelve) hours as needed for allergies or rhinitis.   furosemide  (LASIX ) 40 MG tablet Take 1 tablet (40 mg total) by mouth 2 (two) times daily. BID for 2 days then daily   ibuprofen  (ADVIL ) 200 MG tablet Take 200 mg by mouth every 6 (six) hours as needed.   latanoprost  (XALATAN) 0.005 % ophthalmic solution Place 1 drop into the left eye at bedtime.   Polyethyl Glycol-Propyl Glycol 0.4-0.3 % SOLN Place 1-2 drops into both eyes daily as needed (for dry eyes).   polyethylene glycol (MIRALAX  / GLYCOLAX ) 17 g packet Take 17 g by mouth as needed.   potassium chloride  SA (KLOR-CON  M) 20 MEQ tablet Take 1 tablet (20 mEq total) by mouth daily.   pravastatin  (PRAVACHOL ) 10 MG tablet TAKE 1 TABLET BY MOUTH DAILY   risperiDONE (RISPERDAL) 0.25 MG tablet Take 0.5 mg by mouth 2 (two) times daily.  Give 0.5 mg by mouth two times a day related to DEMENTIA IN OTHER DISEASES CLASSIFIED ELSEWHERE, MODERATE, WITH AGITATION (F02.B11)   rivastigmine  (EXELON ) 3 MG capsule Take 4.5 mg by mouth 2 (two) times daily.   senna (SENOKOT) 8.6 MG TABS tablet Take 2 tablets by mouth as needed for mild constipation.   sertraline  (ZOLOFT ) 50 MG tablet Take 75 mg by mouth daily.   traMADol  (ULTRAM ) 50 MG tablet Take by mouth every 8 (eight) hours as needed.   No facility-administered encounter medications on file as of 05/03/2024.    Review of Systems  Constitutional:  Positive for unexpected weight change. Negative for activity change, appetite change, chills, diaphoresis, fatigue and fever.  HENT:  Negative for congestion.   Respiratory:  Negative for cough, shortness of breath and wheezing.   Cardiovascular:  Positive for leg swelling. Negative for chest pain and palpitations.  Gastrointestinal:  Negative for abdominal distention, abdominal pain, constipation and diarrhea.  Genitourinary:  Negative for difficulty urinating and dysuria.  Musculoskeletal:  Negative for arthralgias, back pain, gait problem, joint swelling and myalgias.  Skin:  Positive for color change and rash.  Neurological:  Negative for dizziness, tremors, seizures, syncope, facial asymmetry, speech difficulty, weakness, light-headedness, numbness and headaches.  Psychiatric/Behavioral:  Positive for agitation,  behavioral problems and confusion.     Immunization History  Administered Date(s) Administered    sv, Bivalent, Protein Subunit Rsvpref,pf Marlow) 08/11/2022   Fluad  Quad(high Dose 65+) 03/26/2019, 04/23/2020, 04/29/2021, 05/03/2022, 05/10/2023  Fluad  Trivalent(High Dose 65+) 05/05/2023   INFLUENZA, HIGH DOSE SEASONAL PF 04/11/2014, 04/12/2016, 04/13/2017   Influenza Whole 05/02/2012, 05/02/2013   Influenza-Unspecified 04/27/2015, 05/12/2018   Moderna SARS-COV2 Booster Vaccination 06/17/2020, 12/18/2020   Moderna Sars-Covid-2 Vaccination 08/14/2019, 09/12/2019   Pneumococcal Conjugate-13 04/11/2015   Pneumococcal Polysaccharide-23 03/17/2011   Td 02/20/2008   Tdap 06/26/2019   Unspecified SARS-COV-2 Vaccination 08/14/2019, 09/12/2019, 06/17/2020, 12/18/2020, 06/02/2022, 11/25/2022, 05/05/2023   Zoster Recombinant(Shingrix) 09/08/2017, 11/24/2017   Zoster, Live 08/02/2010   Pertinent  Health Maintenance Due  Topic Date Due   Influenza Vaccine  06/29/2024 (Originally 03/02/2024)   DEXA SCAN  Completed   Mammogram  Discontinued   Colonoscopy  Discontinued      09/14/2023    2:16 PM 12/28/2023    3:44 PM 02/27/2024    3:05 PM 04/03/2024    6:54 PM 04/24/2024    2:52 PM  Fall Risk  Falls in the past year? 0 0 0 0 0  Was there an injury with Fall? 0 0 0 0 0  Fall Risk Category Calculator 0 0 0 0 0  Patient at Risk for Falls Due to No Fall Risks   History of fall(s) History of fall(s)  Fall risk Follow up Falls evaluation completed;Falls prevention discussed Falls evaluation completed Falls evaluation completed Falls evaluation completed Falls evaluation completed   Functional Status Survey:    Vitals:   05/03/24 1800  Weight: 218 lb 12.8 oz (99.2 kg)   Body mass index is 28.09 kg/m.  Physical Exam Vitals reviewed.  Constitutional:      Appearance: Normal appearance.  Cardiovascular:     Rate and Rhythm: Normal rate and regular rhythm.  Pulmonary:     Effort: Pulmonary  effort is normal.     Breath sounds: Normal breath sounds.  Musculoskeletal:     Right lower leg: Edema (+2) present.     Left lower leg: Edema (+1) present.  Skin:    General: Skin is warm and dry.     Comments: Maculopapular rash with fluid filled blisters No purulent drainage Not tender  Neurological:     Mental Status: She is alert. Mental status is at baseline.     Labs reviewed: Recent Labs    05/16/23 1408 10/04/23 1612 01/26/24 0000 02/15/24 0000 04/03/24 0000 04/05/24 0000  NA 142 142   < > 137 144 145  K 4.1 4.4   < > 3.9 4.3 3.9  CL 106 106   < > 102 108 105  CO2 29 30   < > 23* 25* 25*  GLUCOSE 100* 98  --   --   --   --   BUN 28* 25*   < > 23* 25* 27*  CREATININE 0.80 0.81   < > 0.8 0.7 0.8  CALCIUM 10.1 10.0   < > 9.7 9.9 9.6   < > = values in this interval not displayed.   Recent Labs    05/16/23 1408 10/04/23 1612 01/26/24 0000 04/03/24 0000  AST 16 15 20 21   ALT 14 11 12 14   ALKPHOS 50 53 63 63  BILITOT 0.8 0.6  --   --   PROT 6.9 7.3  --   --   ALBUMIN 4.1 4.3 4.1 4.0   Recent Labs    10/04/23 1612 01/26/24 0000  WBC 7.9 6.4  NEUTROABS 4.6  --   HGB 14.9 14.1  HCT 44.5 43  MCV 91.7  --   PLT 262.0 262  Lab Results  Component Value Date   TSH 3.24 04/03/2024   Lab Results  Component Value Date   HGBA1C 5.9 10/04/2023   Lab Results  Component Value Date   CHOL 169 01/28/2022   HDL 54.40 01/28/2022   LDLCALC 89 01/28/2022   LDLDIRECT 85.6 02/20/2008   TRIG 127.0 01/28/2022   CHOLHDL 3 01/28/2022    Significant Diagnostic Results in last 30 days:  No results found.  Assessment/Plan 1. Stasis dermatitis (Primary)  Differential includes fixed drug eruption vs stasis dermatitis vs infection  Previously treated with doxycycline   No fever, purulent drainage On risperdal and depakote  Will try steroid cream, if not improving consider holding depakote   - triamcinolone cream (KENALOG) 0.1 %; Apply 1 Application topically 2  (two) times daily for 10 days.    Total time :  time greater than 50% of total time spent doing pt counseling and coordination of care

## 2024-05-04 ENCOUNTER — Other Ambulatory Visit: Payer: Self-pay | Admitting: Adult Health

## 2024-05-04 MED ORDER — LORAZEPAM 0.5 MG PO TABS: 0.5000 mg | ORAL_TABLET | Freq: Three times a day (TID) | ORAL | 0 refills | Status: DC | PRN

## 2024-05-05 DIAGNOSIS — R413 Other amnesia: Secondary | ICD-10-CM | POA: Diagnosis not present

## 2024-05-05 DIAGNOSIS — R41841 Cognitive communication deficit: Secondary | ICD-10-CM | POA: Diagnosis not present

## 2024-05-07 DIAGNOSIS — R600 Localized edema: Secondary | ICD-10-CM | POA: Diagnosis not present

## 2024-05-07 LAB — BASIC METABOLIC PANEL WITH GFR
BUN: 26 — AB (ref 4–21)
CO2: 25 — AB (ref 13–22)
Chloride: 106 (ref 99–108)
Creatinine: 0.8 (ref 0.5–1.1)
Glucose: 102
Potassium: 4.1 meq/L (ref 3.5–5.1)
Sodium: 143 (ref 137–147)

## 2024-05-07 LAB — HEPATIC FUNCTION PANEL
ALT: 14 U/L (ref 7–35)
AST: 18 (ref 13–35)
Alkaline Phosphatase: 62 (ref 25–125)
Bilirubin, Total: 0.2

## 2024-05-07 LAB — COMPREHENSIVE METABOLIC PANEL WITH GFR
Albumin: 3.5 (ref 3.5–5.0)
Calcium: 8.9 (ref 8.7–10.7)
Globulin: 1.9
eGFR: 79

## 2024-05-08 DIAGNOSIS — R41841 Cognitive communication deficit: Secondary | ICD-10-CM | POA: Diagnosis not present

## 2024-05-08 DIAGNOSIS — R413 Other amnesia: Secondary | ICD-10-CM | POA: Diagnosis not present

## 2024-05-09 DIAGNOSIS — R41841 Cognitive communication deficit: Secondary | ICD-10-CM | POA: Diagnosis not present

## 2024-05-09 DIAGNOSIS — Z5181 Encounter for therapeutic drug level monitoring: Secondary | ICD-10-CM | POA: Diagnosis not present

## 2024-05-09 DIAGNOSIS — R413 Other amnesia: Secondary | ICD-10-CM | POA: Diagnosis not present

## 2024-05-09 LAB — BASIC METABOLIC PANEL WITH GFR
BUN: 25 — AB (ref 4–21)
CO2: 26 — AB (ref 13–22)
Chloride: 105 (ref 99–108)
Creatinine: 0.8 (ref 0.5–1.1)
Glucose: 96
Potassium: 4.3 meq/L (ref 3.5–5.1)
Sodium: 141 (ref 137–147)

## 2024-05-09 LAB — COMPREHENSIVE METABOLIC PANEL WITH GFR
Calcium: 8.9 (ref 8.7–10.7)
eGFR: 80

## 2024-05-15 DIAGNOSIS — R41841 Cognitive communication deficit: Secondary | ICD-10-CM | POA: Diagnosis not present

## 2024-05-15 DIAGNOSIS — R413 Other amnesia: Secondary | ICD-10-CM | POA: Diagnosis not present

## 2024-05-17 DIAGNOSIS — F332 Major depressive disorder, recurrent severe without psychotic features: Secondary | ICD-10-CM | POA: Diagnosis not present

## 2024-05-17 DIAGNOSIS — R413 Other amnesia: Secondary | ICD-10-CM | POA: Diagnosis not present

## 2024-05-17 DIAGNOSIS — R41841 Cognitive communication deficit: Secondary | ICD-10-CM | POA: Diagnosis not present

## 2024-05-22 ENCOUNTER — Encounter: Payer: Self-pay | Admitting: Internal Medicine

## 2024-05-29 DIAGNOSIS — R413 Other amnesia: Secondary | ICD-10-CM | POA: Diagnosis not present

## 2024-05-29 DIAGNOSIS — R41841 Cognitive communication deficit: Secondary | ICD-10-CM | POA: Diagnosis not present

## 2024-06-05 ENCOUNTER — Encounter: Payer: Self-pay | Admitting: Internal Medicine

## 2024-06-05 ENCOUNTER — Non-Acute Institutional Stay: Payer: Self-pay | Admitting: Internal Medicine

## 2024-06-05 VITALS — BP 124/78 | HR 77 | Temp 98.2°F | Ht 74.0 in | Wt 245.0 lb

## 2024-06-05 DIAGNOSIS — F02B18 Dementia in other diseases classified elsewhere, moderate, with other behavioral disturbance: Secondary | ICD-10-CM

## 2024-06-05 DIAGNOSIS — R600 Localized edema: Secondary | ICD-10-CM

## 2024-06-05 DIAGNOSIS — G301 Alzheimer's disease with late onset: Secondary | ICD-10-CM

## 2024-06-05 DIAGNOSIS — R41841 Cognitive communication deficit: Secondary | ICD-10-CM | POA: Diagnosis not present

## 2024-06-05 DIAGNOSIS — F02B11 Dementia in other diseases classified elsewhere, moderate, with agitation: Secondary | ICD-10-CM | POA: Diagnosis not present

## 2024-06-05 DIAGNOSIS — R635 Abnormal weight gain: Secondary | ICD-10-CM

## 2024-06-05 DIAGNOSIS — R2681 Unsteadiness on feet: Secondary | ICD-10-CM | POA: Diagnosis not present

## 2024-06-06 DIAGNOSIS — R413 Other amnesia: Secondary | ICD-10-CM | POA: Diagnosis not present

## 2024-06-06 DIAGNOSIS — F02B11 Dementia in other diseases classified elsewhere, moderate, with agitation: Secondary | ICD-10-CM | POA: Diagnosis not present

## 2024-06-06 DIAGNOSIS — Z23 Encounter for immunization: Secondary | ICD-10-CM | POA: Diagnosis not present

## 2024-06-06 DIAGNOSIS — R2681 Unsteadiness on feet: Secondary | ICD-10-CM | POA: Diagnosis not present

## 2024-06-06 DIAGNOSIS — R41841 Cognitive communication deficit: Secondary | ICD-10-CM | POA: Diagnosis not present

## 2024-06-07 DIAGNOSIS — R41841 Cognitive communication deficit: Secondary | ICD-10-CM | POA: Diagnosis not present

## 2024-06-07 DIAGNOSIS — R2243 Localized swelling, mass and lump, lower limb, bilateral: Secondary | ICD-10-CM | POA: Diagnosis not present

## 2024-06-07 DIAGNOSIS — R413 Other amnesia: Secondary | ICD-10-CM | POA: Diagnosis not present

## 2024-06-07 DIAGNOSIS — L539 Erythematous condition, unspecified: Secondary | ICD-10-CM | POA: Diagnosis not present

## 2024-06-07 MED ORDER — TORSEMIDE 40 MG PO TABS: 40.0000 mg | ORAL_TABLET | Freq: Every day | ORAL | Status: AC

## 2024-06-07 NOTE — Progress Notes (Signed)
 Location:   [facility]   Place of Service:   Clinic  Provider:   Code Status:  Goals of Care:     02/14/2024    3:51 PM  Advanced Directives  Does Patient Have a Medical Advance Directive? Yes  Type of Advance Directive Living will;Healthcare Power of Attorney  Does patient want to make changes to medical advance directive? No - Patient declined  Copy of Healthcare Power of Attorney in Chart? Yes - validated most recent copy scanned in chart (See row information)     Chief Complaint  Patient presents with   Follow-up    1 month follow up Patient has concerns about her legs    HPI: Patient is a 78 y.o. female seen today for medical management of chronic diseases.    Patient ilives [place] in [facility]  her sister who lives in [place] is [place] Patient has never been married and has no kids    Her Acute issues  LE edema Worsen since 04/2024  Right more then Left Also Has Redness in both the LE Was treated with doxycycline  She is on Lasix  twice daily also getting the Kerlix wrap. The edema got so bad that it is leaking. Also causing  pain to the patient because her legs are so tight Worsening dementia With behaviors ith MRI done in 2023 shows atrophy and chronic lacunar infarct. Her recent MMSE at neurology office was 16/30  Has seen Dr. Tasia is on Risperdal and doxepin could not do Rexulti  due to cost Also on Depakote  Plan  to move to Memory Unit due to needing help with ADLS Right lower quadrant pain Has been chronic CT Scan negative Dr Rosalie does not think she needs more aggressive work up On Bentyl   Wt Readings from Last 3 Encounters:  06/05/24 245 lb (111.1 kg)  05/03/24 218 lb 12.8 oz (99.2 kg)  04/30/24 228 lb 6.4 oz (103.6 kg)     Past Medical History:  Diagnosis Date   Abdominal cramps 08/03/2022   Chronic abdominal cramps - periodic. Worse. Treat constipation.  (Pt saw Dr Rosalie, had an MRI).   Allergic rhinitis 08/03/2022   Chronic runny nose -  Atrovent  did not work  Start Loratidine 10 mg/d   Arthritis    Atrophic vaginitis 04/29/2021   9/22  Dr Marget, dr Elisabeth  On estrocream vag 3/wk PV   Bilateral leg weakness 04/06/2013   Present since 2011-2012. She must push up from a chair   CAD (coronary artery disease) 04/29/2021   2021 CT coronary calcium score of 302.   Cont w/Simvastatin  - d/c, ASA  Cont on Pravastatin    Cataract    Both eyes have had lens replacement.   Constipation 08/03/2022   Chronic abdominal cramps - periodic. Worse.  (Pt saw Dr Rosalie, had an MRI).   Diverticulosis of colon 10/02/2010   Dyslipidemia 02/20/2008   Simvastatin      NMR Lipoprofile 2005: LDL particle # 1776/ small dense 1106 on Lipitor 20 mg daily.   MGF MI in 35s; father MI @ 1.  No FH CVA  NMR 05/2011:LDL 78 (1169/542), HDL 41, TG 135. LDL goal < 100, ideally < 70.     [facility] panel: Hyperabsorber of  dietary cholesterol; Lp(a) 112; CRP 2.7     12/19 cardiac CT scan for calcium scoring offered     Cont on Pravastatin       Dysrhythmia    palpitations recently   Elevated blood pressure reading without diagnosis  of hypertension 04/10/2010   Generalized anxiety disorder 07/29/2021   12/22  Discussed. Pt declined meds, declined ref to psychology, psychiatry... Try Valerian root for anxiety   Glaucoma 03/17/2011   Groin pain 03/26/2019   8/20 R - ?MSK vs other  R/o hernia  Gen surg ref vs CT discussed  06/2019 CT ok - Dr Rosalie  MSK strain   Heart murmur    History of colonic polyps 03/25/2009       Colonoscopy 2009: Adenomatous polyps, Dr Rosalie.   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   Pigmentary glaucoma of both eyes, mild stage 04/09/2013   Plantar fasciitis, left 06/18/2009   Polycythemia, secondary 04/12/2016   Mild 2016   Pre-diabetes    per patient   Pseudophakia 06/23/2012   TIA (transient ischemic attack) 07/08/2019   12/20 probable-global aphasia, confusion  global aphasia, confusion - no relapse  Doppler (-) B  Card ECHO ordered   Urinary urgency 10/02/2010   Chronic  Dr Marget, Dr McDiarmid d/c. Seeing Dr Elisabeth  ?OAB  Trial of prn Ditropan  2022  Ditropan  po  Try estrocream vag 3/wk PV  On Macrobid qd  10/22 Memory loss is worse.  Likely aggravated by daily use of Ditropan .  She can switch to as needed use.  12/22 on Estrocream and Myrbetriq   Discussed anxiety issues. Pt declined meds, declined ref to psychology, psychiatry...  Try Valerian root for anxie   Varicose veins of lower extremities 07/13/2010    Past Surgical History:  Procedure Laterality Date   ABDOMINAL HYSTERECTOMY     ANTERIOR AND POSTERIOR REPAIR WITH SACROSPINOUS FIXATION N/A 06/05/2015   Procedure: ANTERIOR AND POSTERIOR REPAIR WITH SACROSPINOUS LIGAMENT SUSPENSION;  Surgeon: Alm Marget, MD;  Location: [facility];  Service: Gynecology;  Laterality: N/A;   CATARACT EXTRACTION, BILATERAL     lens implant   CHOLECYSTECTOMY  1991   CHOLECYSTECTOMY, LAPAROSCOPIC     Dr Merrilyn   COLONOSCOPY  2015   neg; due 2020   colonoscopy with polypectomy     Dr Rosalie; X 3   EYE SURGERY     trabeculectomy ;Dr Marci.Cataract removal OD; Dr Eliza   TOTAL HIP ARTHROPLASTY Right 09/22/2018   Procedure: TOTAL HIP ARTHROPLASTY ANTERIOR APPROACH;  Surgeon: Yvone Rush, MD;  Location: [facility];  Service: Orthopedics;  Laterality: Right;    Allergies  Allergen Reactions   Donepezil      diarrhea   Namenda  [Memantine ]     headache   Latex Rash    Red rash around red bandaid    Outpatient Encounter Medications as  of 06/05/2024  Medication Sig   acetaminophen  (TYLENOL ) 325 MG tablet Take 650 mg by mouth every morning.   acetaminophen  (TYLENOL ) 500 MG tablet Take 1,000 mg by mouth every 12 (twelve) hours as needed.   aspirin  81 MG EC tablet Take 81 mg by mouth daily. Swallow whole.   brimonidine-timolol  (COMBIGAN)  0.2-0.5 % ophthalmic solution Place 1 drop into the left eye 2 (two) times daily.   cetirizine (ZYRTEC) 10 MG tablet Take 10 mg by mouth daily.   dextromethorphan-guaiFENesin (ROBITUSSIN-DM) 10-100 MG/5ML liquid Take 10 mLs by mouth every 6 (six) hours as needed for cough.   diclofenac Sodium (VOLTAREN) 1 % GEL Apply topically as needed.   dicyclomine  (BENTYL ) 10 MG capsule Take 1 capsule (10 mg total) by mouth every morning.   divalproex  (DEPAKOTE  ER) 500 MG 24 hr tablet Take 500 mg by mouth every evening. Give 500 mg by mouth in the evening for aggressive behaviors   divalproex  (DEPAKOTE ) 250 MG DR tablet Take 250 mg by mouth daily.  Give 250 mg by mouth one time a day for aggressive behaviors   doxepin (SINEQUAN) 10 MG capsule Take 25 mg by mouth at bedtime.  Give 1 capsule by mouth at bedtime for insomnia   fluticasone (FLONASE) 50 MCG/ACT nasal spray Place 1 spray into both nostrils every 12 (twelve) hours as needed for allergies or rhinitis.   furosemide  (LASIX ) 40 MG tablet Take 40 mg by mouth daily.   ibuprofen  (ADVIL ) 200 MG tablet Take 200 mg by mouth every 6 (six) hours as needed.   latanoprost (XALATAN) 0.005 % ophthalmic solution Place 1 drop into the left eye at bedtime.   LORazepam (ATIVAN) 1 MG tablet Take 1 mg by mouth 2 (two) times daily as needed for anxiety. Give 1 mg orally as needed for BID PRN for agitation   Polyethyl Glycol-Propyl Glycol 0.4-0.3 % SOLN Place 1-2 drops into both eyes daily as needed (for dry eyes).   polyethylene glycol (MIRALAX  / GLYCOLAX ) 17 g packet Take 17 g by mouth as needed.   potassium chloride  SA (KLOR-CON  M) 20 MEQ tablet Take 1 tablet (20 mEq  total) by mouth daily.   pravastatin  (PRAVACHOL ) 10 MG tablet TAKE 1 TABLET BY MOUTH DAILY   risperiDONE (RISPERDAL) 0.25 MG tablet Take 0.5 mg by mouth daily.  Give 0.5 mg by mouth two times a day related to DEMENTIA IN OTHER DISEASES CLASSIFIED ELSEWHERE, MODERATE, WITH AGITATION (F02.B11)   risperiDONE (RISPERDAL) 1 MG tablet Take 1 mg by mouth at bedtime.   rivastigmine  (EXELON ) 3 MG capsule Take 4.5 mg by mouth 2 (two) times daily.   senna (SENOKOT) 8.6 MG TABS tablet Take 2 tablets by mouth as needed for mild constipation.   sertraline  (ZOLOFT ) 50 MG tablet Take 75 mg by mouth daily.   traMADol  (ULTRAM ) 50 MG tablet Take by mouth every 8 (eight) hours as needed.   furosemide  (LASIX ) 40 MG tablet Take 1 tablet (40 mg total) by mouth 2 (two) times daily. BID for 2 days then daily (Patient not taking: Reported on 06/05/2024)   LORazepam (ATIVAN) 0.5 MG tablet Take 1 tablet (0.5 mg total) by mouth 3 (three) times daily as needed for anxiety. Please provide transdermal ativan gel. Rub Ativan 0.5 mg in gel formulation TID as needed for agitation (Patient not taking: Reported on 06/05/2024)   No facility-administered encounter medications on file as of 06/05/2024.    Review of Systems:  Review of Systems  Unable to perform ROS: Dementia    Health Maintenance  Topic Date Due   Influenza Vaccine  06/29/2024 (Originally 03/02/2024)   COVID-19 Vaccine (10 - 2025-26 season) 06/29/2024 (Originally 04/02/2024)   Medicare Annual Wellness (AWV)  09/13/2024   DTaP/Tdap/Td (3 - Td or Tdap) 06/25/2029   Pneumococcal Vaccine: 50+ Years  Completed  DEXA SCAN  Completed   Hepatitis C Screening  Completed   Zoster Vaccines- Shingrix  Completed   Meningococcal B Vaccine  Aged Out   Mammogram  Discontinued   Colonoscopy  Discontinued    Physical Exam: Vitals:   06/05/24 1309  BP: 124/78  Pulse: 77  Temp: 98.2 F (36.8 C)  SpO2: 98%  Weight: 245 lb (111.1 kg)  Height: 6' 2 (1.88 m)   Body mass  index is 31.46 kg/m. Physical Exam Vitals reviewed.  Constitutional:      Appearance: Normal appearance.  HENT:     Head: Normocephalic.     Nose: Nose normal.     Mouth/Throat:     Mouth: Mucous membranes are moist.     Pharynx: Oropharynx is clear.  Eyes:     Pupils: Pupils are equal, round, and reactive to light.  Cardiovascular:     Rate and Rhythm: Normal rate and regular rhythm.     Pulses: Normal pulses.     Heart sounds: Normal heart sounds. No murmur heard. Pulmonary:     Effort: Pulmonary effort is normal.     Breath sounds: Normal breath sounds.  Abdominal:     General: Abdomen is flat. Bowel sounds are normal.     Palpations: Abdomen is soft.  Musculoskeletal:        General: Swelling present.     Cervical back: Neck supple.     Comments: Bilateral Leg Edema with Right more then Left Some tenderness Redness No Discharge  Skin:    General: Skin is warm.  Neurological:     General: No focal deficit present.     Mental Status: She is alert.     Comments: Was able to stand Up  Needs Cane to walk due to edema  Psychiatric:        Mood and Affect: Mood normal.        Thought Content: Thought content normal.     Labs reviewed: Basic Metabolic Panel: Recent Labs    10/04/23 1612 01/26/24 0000 04/03/24 0000 04/05/24 0000 04/27/24 0000 05/07/24 0000 05/09/24 0000  NA 142   < > 144   < > 141 143 141  K 4.4   < > 4.3   < > 4.2 4.1 4.3  CL 106   < > 108   < > 105 106 105  CO2 30   < > 25*   < > 23* 25* 26*  GLUCOSE 98  --   --   --   --   --   --   BUN 25*   < > 25*   < > 34* 26* 25*  CREATININE 0.81   < > 0.7   < > 0.7 0.8 0.8  CALCIUM 10.0   < > 9.9   < > 9.2 8.9 8.9  TSH 1.94  --  3.24  --   --   --   --    < > = values in this interval not displayed.   Liver Function Tests: Recent Labs    10/04/23 1612 01/26/24 0000 04/03/24 0000 05/07/24 0000  AST 15 20 21 18   ALT 11 12 14 14   ALKPHOS 53 63 63 62  BILITOT 0.6  --   --   --   PROT 7.3  --    --   --   ALBUMIN 4.3 4.1 4.0 3.5   No results for input(s): LIPASE, AMYLASE in the last 8760 hours. No results for  input(s): AMMONIA in the last 8760 hours. CBC: Recent Labs    10/04/23 1612 01/26/24 0000  WBC 7.9 6.4  NEUTROABS 4.6  --   HGB 14.9 14.1  HCT 44.5 43  MCV 91.7  --   PLT 262.0 262   Lipid Panel: No results for input(s): CHOL, HDL, LDLCALC, TRIG, CHOLHDL, LDLDIRECT in the last 8760 hours. Lab Results  Component Value Date   HGBA1C 5.9 10/04/2023    Procedures since last visit: No results found.  Assessment/Plan 1. Bilateral edema of lower extremity (Primary) Discontinue Lasix  Torsemide 40 mg every day with Potassium Repeat BMP in 1 week Check Dopplers Addendum Normal Dopplers BNP in Past has been normal Schedule Tramadol  at night to help with Pain inher legs 2. Weight gain Due to Fluid  Do Torsemide  3. Moderate late onset Alzheimer's dementia with other behavioral disturbance (HCC) On Exelon  Depakote  and Risperdal Moving to SNF level of care    Labs/tests ordered:  BMP in 1 week Next appt:  07/03/2024

## 2024-06-08 ENCOUNTER — Encounter: Payer: Self-pay | Admitting: Adult Health

## 2024-06-08 ENCOUNTER — Non-Acute Institutional Stay: Payer: Self-pay | Admitting: Adult Health

## 2024-06-08 DIAGNOSIS — L03115 Cellulitis of right lower limb: Secondary | ICD-10-CM

## 2024-06-08 DIAGNOSIS — R600 Localized edema: Secondary | ICD-10-CM | POA: Diagnosis not present

## 2024-06-08 MED ORDER — DOXYCYCLINE HYCLATE 100 MG PO TABS: 100.0000 mg | ORAL_TABLET | Freq: Two times a day (BID) | ORAL | 0 refills | Status: AC

## 2024-06-08 NOTE — Progress Notes (Signed)
 Location:  Medical Illustrator of Service:  ALF (13) Provider:   Bari America, ANP [facility] [phone]   Charlanne Fredia CROME, MD  Patient Care Team: Charlanne Fredia CROME, MD as PCP - General (Internal Medicine) Rosalie Kitchens, MD as Consulting Physician (Gastroenterology) Marget Lenis, MD as Consulting Physician (Obstetrics and Gynecology) Harvey Seltzer, MD as Consulting Physician (Sports Medicine) Yvone Rush, MD as Consulting Physician (Orthopedic Surgery) Gaston Hamilton, MD as Consulting Physician (Urology) Elisabeth Valli BIRCH, MD as Consulting Physician (Urology) Wertman, Sara E, PA-C (Neurology) Bond, Reyes Mcardle, MD as Referring Physician (Ophthalmology)  Extended Emergency Contact Information Primary Emergency Contact: [facility] [place] [facility] Phone: [phone] Mobile Phone: [phone] Relation: Other Secondary Emergency Contact: Diehl,Carol Address: [address]          [place], [place] [address] [place] Home Phone: [phone] Mobile Phone: [phone] Relation: Sister  Goals of care: Advanced Directive information    02/14/2024    3:51 PM  Advanced Directives  Does Patient Have a Medical Advance Directive? Yes  Type of Advance Directive Living will;Healthcare Power of Attorney  Does patient want to make changes to medical advance directive? No - Patient declined  Copy of Healthcare Power of Attorney in Chart? Yes - validated most recent copy scanned in chart (See row information)     Chief Complaint  Patient presents with   Acute Visit    Right leg redness and drainage     HPI:  Pt is a 78 y.o. female seen today for an acute visit for leg redness  The nurse reports her right leg is more edematous and has redness and drainage and white discoloration She was started on torsemide on 11/4 Her weight is down by 2 lbs yet edema is worse on the right.  She has been struggling with  venous insuff and edema in the past couple of months and previously was not responding to lasix .  Prior hepatic function panel and BNP were normal Bilateral lower ext dopplers neg 06/07/24 The nurse is cleansing her legs daily and applying compression wraps.   Past Medical History:  Diagnosis Date   Abdominal cramps 08/03/2022   Chronic abdominal cramps - periodic. Worse. Treat constipation.  (Pt saw Dr Rosalie, had an MRI).   Allergic rhinitis 08/03/2022   Chronic runny nose - Atrovent  did not work  Start Loratidine 10 mg/d   Arthritis    Atrophic vaginitis 04/29/2021   9/22  Dr Marget, dr Elisabeth  On estrocream vag 3/wk PV   Bilateral leg weakness 04/06/2013   Present since 2011-2012. She must push up from a chair   CAD (coronary artery disease) 04/29/2021   2021 CT coronary calcium score of 302.   Cont w/Simvastatin  - d/c, ASA  Cont on Pravastatin    Cataract    Both eyes have had lens replacement.   Constipation 08/03/2022   Chronic abdominal cramps - periodic. Worse.  (Pt saw Dr Rosalie, had an MRI).   Diverticulosis of colon 10/02/2010   Dyslipidemia 02/20/2008   Simvastatin      NMR Lipoprofile 2005: LDL particle # 1776/ small dense 1106 on Lipitor 20 mg daily.   MGF MI in 101s; father MI @ 78.  No FH CVA  NMR 05/2011:LDL 78 (1169/542), HDL 41, TG 135. LDL goal < 100, ideally < 70.     [facility] panel: Hyperabsorber of  dietary cholesterol; Lp(a) 112; CRP 2.7     12/19 cardiac CT  scan for calcium scoring offered     Cont on Pravastatin       Dysrhythmia    palpitations recently   Elevated blood pressure reading without diagnosis of hypertension 04/10/2010   Generalized anxiety disorder 07/29/2021   12/22  Discussed. Pt declined meds, declined ref to psychology, psychiatry... Try Valerian root for anxiety   Glaucoma 03/17/2011   Groin pain 03/26/2019   8/20 R - ?MSK vs other  R/o hernia  Gen surg ref vs CT discussed  06/2019 CT ok - Dr Rosalie  MSK strain   Heart murmur    History of  colonic polyps 03/25/2009       Colonoscopy 2009: Adenomatous polyps, Dr Rosalie.   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) 06/2022   06/2022 MRI - Small chronic lacunar infarcts within the corona radiata, bilaterally.   Pigmentary glaucoma of both eyes, mild stage 04/09/2013   Plantar fasciitis, left 06/18/2009   Polycythemia, secondary 04/12/2016   Mild 2016   Pre-diabetes    per patient   Pseudophakia 06/23/2012   TIA (transient ischemic attack) 07/08/2019   12/20 probable-global aphasia, confusion  global aphasia, confusion - no relapse  Doppler (-) B  Card ECHO ordered   Urinary urgency 10/02/2010   Chronic  Dr Marget, Dr McDiarmid d/c. Seeing Dr Elisabeth  ?OAB  Trial of prn Ditropan  2022  Ditropan  po  Try estrocream vag 3/wk PV  On Macrobid qd  10/22 Memory loss is worse.  Likely aggravated by daily use of Ditropan .  She can switch to as needed use.  12/22 on Estrocream and Myrbetriq   Discussed anxiety issues. Pt declined meds, declined ref to psychology, psychiatry...  Try Valerian root for anxie   Varicose veins of lower extremities 07/13/2010   Past Surgical History:  Procedure Laterality Date   ABDOMINAL HYSTERECTOMY     ANTERIOR AND POSTERIOR REPAIR WITH SACROSPINOUS FIXATION N/A 06/05/2015   Procedure: ANTERIOR AND POSTERIOR REPAIR WITH SACROSPINOUS LIGAMENT SUSPENSION;  Surgeon: Alm Marget, MD;  Location: [facility];  Service: Gynecology;  Laterality: N/A;   CATARACT EXTRACTION, BILATERAL     lens implant   CHOLECYSTECTOMY  1991   CHOLECYSTECTOMY, LAPAROSCOPIC     Dr Merrilyn   COLONOSCOPY  2015   neg; due 2020   colonoscopy  with polypectomy     Dr Rosalie; X 3   EYE SURGERY     trabeculectomy ;Dr Marci.Cataract removal OD; Dr Eliza   TOTAL HIP ARTHROPLASTY Right 09/22/2018   Procedure: TOTAL HIP ARTHROPLASTY ANTERIOR APPROACH;  Surgeon: Yvone Rush, MD;  Location: [facility];  Service: Orthopedics;  Laterality: Right;    Allergies  Allergen Reactions   Donepezil      diarrhea   Namenda  [Memantine ]     headache   Latex Rash    Red rash around red bandaid    Outpatient Encounter Medications as of 06/08/2024  Medication Sig   acetaminophen  (TYLENOL ) 325 MG tablet Take 650 mg by mouth every morning.   acetaminophen  (TYLENOL ) 500 MG tablet  Take 1,000 mg by mouth every 12 (twelve) hours as needed.   aspirin  81 MG EC tablet Take 81 mg by mouth daily. Swallow whole.   brimonidine-timolol  (COMBIGAN) 0.2-0.5 % ophthalmic solution Place 1 drop into the left eye 2 (two) times daily.   cetirizine (ZYRTEC) 10 MG tablet Take 10 mg by mouth daily.   dextromethorphan-guaiFENesin (ROBITUSSIN-DM) 10-100 MG/5ML liquid Take 10 mLs by mouth every 6 (six) hours as needed for cough.   diclofenac Sodium (VOLTAREN) 1 % GEL Apply topically as needed.   dicyclomine  (BENTYL ) 10 MG capsule Take 1 capsule (10 mg total) by mouth every morning.   divalproex  (DEPAKOTE  ER) 500 MG 24 hr tablet Take 500 mg by mouth every evening. Give 500 mg by mouth in the evening for aggressive behaviors   divalproex  (DEPAKOTE ) 250 MG DR tablet Take 250 mg by mouth daily.  Give 250 mg by mouth one time a day for aggressive behaviors   doxepin (SINEQUAN) 10 MG capsule Take 25 mg by mouth at bedtime.  Give 1 capsule by mouth at bedtime for insomnia   fluticasone (FLONASE) 50 MCG/ACT nasal spray Place 1 spray into both nostrils every 12 (twelve) hours as needed for allergies or rhinitis.   ibuprofen  (ADVIL ) 200 MG tablet Take 200 mg by mouth every 6 (six) hours as needed.   latanoprost (XALATAN) 0.005 % ophthalmic solution Place 1 drop into the left eye at  bedtime.   LORazepam (ATIVAN) 0.5 MG tablet Take 1 tablet (0.5 mg total) by mouth 3 (three) times daily as needed for anxiety. Please provide transdermal ativan gel. Rub Ativan 0.5 mg in gel formulation TID as needed for agitation (Patient not taking: Reported on 06/05/2024)   LORazepam (ATIVAN) 1 MG tablet Take 1 mg by mouth 2 (two) times daily as needed for anxiety. Give 1 mg orally as needed for BID PRN for agitation   Polyethyl Glycol-Propyl Glycol 0.4-0.3 % SOLN Place 1-2 drops into both eyes daily as needed (for dry eyes).   polyethylene glycol (MIRALAX  / GLYCOLAX ) 17 g packet Take 17 g by mouth as needed.   potassium chloride  SA (KLOR-CON  M) 20 MEQ tablet Take 1 tablet (20 mEq total) by mouth daily.   pravastatin  (PRAVACHOL ) 10 MG tablet TAKE 1 TABLET BY MOUTH DAILY   risperiDONE (RISPERDAL) 0.25 MG tablet Take 0.5 mg by mouth daily.  Give 0.5 mg by mouth two times a day related to DEMENTIA IN OTHER DISEASES CLASSIFIED ELSEWHERE, MODERATE, WITH AGITATION (F02.B11)   risperiDONE (RISPERDAL) 1 MG tablet Take 1 mg by mouth at bedtime.   rivastigmine  (EXELON ) 3 MG capsule Take 4.5 mg by mouth 2 (two) times daily.   senna (SENOKOT) 8.6 MG TABS tablet Take 2 tablets by mouth as needed for mild constipation.   sertraline  (ZOLOFT ) 50 MG tablet Take 75 mg by mouth daily.   Torsemide 40 MG TABS Take 40 mg by mouth daily.   traMADol  (ULTRAM ) 50 MG tablet Take by mouth every 8 (eight) hours as needed.   No facility-administered encounter medications on file as of 06/08/2024.    Review of Systems  Constitutional:  Negative for activity change, appetite change, chills, diaphoresis, fatigue and fever.  HENT:  Negative for congestion.   Respiratory:  Negative for cough, shortness of breath and wheezing.   Cardiovascular:  Positive for leg swelling. Negative for chest pain.  Gastrointestinal:  Negative for abdominal distention, abdominal pain, constipation, diarrhea, nausea and vomiting.   Genitourinary:  Negative for difficulty urinating, dysuria  and urgency.  Musculoskeletal:  Positive for gait problem. Negative for back pain, myalgias and neck pain.  Skin:  Positive for color change and wound. Negative for rash.  Neurological:  Negative for dizziness and weakness.  Psychiatric/Behavioral:  Positive for agitation, behavioral problems and confusion.     Immunization History  Administered Date(s) Administered    sv, Bivalent, Protein Subunit Rsvpref,pf (Abrysvo) 08/11/2022   Fluad  Quad(high Dose 65+) 03/26/2019, 04/23/2020, 04/29/2021, 05/03/2022, 05/10/2023   Fluad  Trivalent(High Dose 65+) 05/05/2023   INFLUENZA, HIGH DOSE SEASONAL PF 04/11/2014, 04/12/2016, 04/13/2017   Influenza Whole 05/02/2012, 05/02/2013   Influenza-Unspecified 04/27/2015, 05/12/2018   Moderna SARS-COV2 Booster Vaccination 06/17/2020, 12/18/2020   Moderna Sars-Covid-2 Vaccination 08/14/2019, 09/12/2019   Pneumococcal Conjugate-13 04/11/2015   Pneumococcal Polysaccharide-23 03/17/2011   Td 02/20/2008   Tdap 06/26/2019   Unspecified SARS-COV-2 Vaccination 08/14/2019, 09/12/2019, 06/17/2020, 12/18/2020, 06/02/2022, 11/25/2022, 05/05/2023   Zoster Recombinant(Shingrix) 09/08/2017, 11/24/2017   Zoster, Live 08/02/2010   Pertinent  Health Maintenance Due  Topic Date Due   Influenza Vaccine  06/29/2024 (Originally 03/02/2024)   DEXA SCAN  Completed   Mammogram  Discontinued   Colonoscopy  Discontinued      09/14/2023    2:16 PM 12/28/2023    3:44 PM 02/27/2024    3:05 PM 04/03/2024    6:54 PM 04/24/2024    2:52 PM  Fall Risk  Falls in the past year? 0 0 0 0 0  Was there an injury with Fall? 0 0 0 0 0  Fall Risk Category Calculator 0 0 0 0 0  Patient at Risk for Falls Due to No Fall Risks   History of fall(s) History of fall(s)  Fall risk Follow up Falls evaluation completed;Falls prevention discussed Falls evaluation completed Falls evaluation completed Falls evaluation completed Falls evaluation  completed   Functional Status Survey:    Vitals:   06/08/24 1058  BP: 112/71  Pulse: 76  Resp: 18  Temp: 98.1 F (36.7 C)  SpO2: 94%  Weight: 241 lb 3.2 oz (109.4 kg)   Body mass index is 30.97 kg/m. Physical Exam Vitals and nursing note reviewed.  Constitutional:      General: She is not in acute distress.    Appearance: She is not diaphoretic.  HENT:     Head: Normocephalic and atraumatic.  Neck:     Vascular: No JVD.  Cardiovascular:     Rate and Rhythm: Normal rate and regular rhythm.     Heart sounds: No murmur heard. Pulmonary:     Effort: Pulmonary effort is normal. No respiratory distress.     Breath sounds: Wheezing present.  Musculoskeletal:     Right lower leg: Edema present.     Left lower leg: Edema present.  Skin:    General: Skin is warm and dry.  Neurological:     Mental Status: She is alert. Mental status is at baseline.  Psychiatric:        Mood and Affect: Mood normal.         Labs reviewed: Recent Labs    10/04/23 1612 01/26/24 0000 04/27/24 0000 05/07/24 0000 05/09/24 0000  NA 142   < > 141 143 141  K 4.4   < > 4.2 4.1 4.3  CL 106   < > 105 106 105  CO2 30   < > 23* 25* 26*  GLUCOSE 98  --   --   --   --   BUN 25*   < > 34* 26* 25*  CREATININE 0.81   < > 0.7 0.8 0.8  CALCIUM 10.0   < > 9.2 8.9 8.9   < > = values in this interval not displayed.   Recent Labs    10/04/23 1612 01/26/24 0000 04/03/24 0000 05/07/24 0000  AST 15 20 21 18   ALT 11 12 14 14   ALKPHOS 53 63 63 62  BILITOT 0.6  --   --   --   PROT 7.3  --   --   --   ALBUMIN 4.3 4.1 4.0 3.5   Recent Labs    10/04/23 1612 01/26/24 0000  WBC 7.9 6.4  NEUTROABS 4.6  --   HGB 14.9 14.1  HCT 44.5 43  MCV 91.7  --   PLT 262.0 262   Lab Results  Component Value Date   TSH 3.24 04/03/2024   Lab Results  Component Value Date   HGBA1C 5.9 10/04/2023   Lab Results  Component Value Date   CHOL 169 01/28/2022   HDL 54.40 01/28/2022   LDLCALC 89  01/28/2022   LDLDIRECT 85.6 02/20/2008   TRIG 127.0 01/28/2022   CHOLHDL 3 01/28/2022    Significant Diagnostic Results in last 30 days:  No results found.  Assessment/Plan  1. Cellulitis of right lower extremity (Primary) She has chronic erythema and redness to both lower extremities but this is worsening on the right leg. I am suspecting cellulitis due to the drainage, redness, and swelling.   - doxycycline  (VIBRA -TABS) 100 MG tablet; Take 1 tablet (100 mg total) by mouth 2 (two) times daily for 10 days.  Dispense: 20 tablet; Refill: 0  Continue wraps each day  2. Bilateral edema of lower extremity Increase torsemide to 40 mg bid for two days then return to 40 mg daily Check Labs 11/10 Recommend 2D echo    Next apt 12/2, considering depakote  taper due to possible s/e of leg edema.   Left message for her POA Leita nurse to follow up.   Total time :  time greater than 50% of total time spent doing pt counseling and coordination of care

## 2024-06-09 ENCOUNTER — Emergency Department (HOSPITAL_COMMUNITY)
Admission: EM | Admit: 2024-06-09 | Discharge: 2024-06-09 | Disposition: A | Discharge status: Home / Self Care | Attending: Emergency Medicine | Admitting: Emergency Medicine

## 2024-06-09 ENCOUNTER — Emergency Department (HOSPITAL_COMMUNITY)

## 2024-06-09 ENCOUNTER — Encounter (HOSPITAL_COMMUNITY): Payer: Self-pay | Admitting: Emergency Medicine

## 2024-06-09 ENCOUNTER — Other Ambulatory Visit: Payer: Self-pay

## 2024-06-09 DIAGNOSIS — M4802 Spinal stenosis, cervical region: Secondary | ICD-10-CM | POA: Diagnosis not present

## 2024-06-09 DIAGNOSIS — F0390 Unspecified dementia without behavioral disturbance: Secondary | ICD-10-CM | POA: Insufficient documentation

## 2024-06-09 DIAGNOSIS — S0990XA Unspecified injury of head, initial encounter: Secondary | ICD-10-CM | POA: Diagnosis not present

## 2024-06-09 DIAGNOSIS — M25559 Pain in unspecified hip: Secondary | ICD-10-CM | POA: Diagnosis not present

## 2024-06-09 DIAGNOSIS — I672 Cerebral atherosclerosis: Secondary | ICD-10-CM | POA: Diagnosis not present

## 2024-06-09 DIAGNOSIS — W19XXXA Unspecified fall, initial encounter: Secondary | ICD-10-CM

## 2024-06-09 DIAGNOSIS — Z7982 Long term (current) use of aspirin: Secondary | ICD-10-CM | POA: Diagnosis not present

## 2024-06-09 DIAGNOSIS — S5001XA Contusion of right elbow, initial encounter: Secondary | ICD-10-CM | POA: Diagnosis not present

## 2024-06-09 DIAGNOSIS — Z79899 Other long term (current) drug therapy: Secondary | ICD-10-CM | POA: Diagnosis not present

## 2024-06-09 DIAGNOSIS — M542 Cervicalgia: Secondary | ICD-10-CM | POA: Insufficient documentation

## 2024-06-09 DIAGNOSIS — W01198A Fall on same level from slipping, tripping and stumbling with subsequent striking against other object, initial encounter: Secondary | ICD-10-CM | POA: Diagnosis not present

## 2024-06-09 DIAGNOSIS — M549 Dorsalgia, unspecified: Secondary | ICD-10-CM | POA: Diagnosis not present

## 2024-06-09 DIAGNOSIS — Z91040 Latex allergy status: Secondary | ICD-10-CM | POA: Insufficient documentation

## 2024-06-09 DIAGNOSIS — R443 Hallucinations, unspecified: Secondary | ICD-10-CM | POA: Diagnosis not present

## 2024-06-09 DIAGNOSIS — M25529 Pain in unspecified elbow: Secondary | ICD-10-CM | POA: Diagnosis not present

## 2024-06-09 DIAGNOSIS — M47812 Spondylosis without myelopathy or radiculopathy, cervical region: Secondary | ICD-10-CM | POA: Diagnosis not present

## 2024-06-09 DIAGNOSIS — S199XXA Unspecified injury of neck, initial encounter: Secondary | ICD-10-CM | POA: Diagnosis not present

## 2024-06-09 DIAGNOSIS — S59901A Unspecified injury of right elbow, initial encounter: Secondary | ICD-10-CM | POA: Diagnosis present

## 2024-06-09 NOTE — ED Triage Notes (Signed)
 Pt arrives via EMS from [facility] with reports of unwitnessed fall. Hx of dementia and hallucinations. Pt reports hitting her head. Denies thinners or LOC. Right elbow swelling. Endorses hip, back and neck pain. Ccollar in place.

## 2024-06-09 NOTE — ED Provider Notes (Signed)
 [place] [facility]MENT AT [facility] Provider Note   CSN: [id number] Arrival date & time: 06/09/24  1341     Patient presents with: Morgan Huff   Morgan Huff is a 78 y.o. female.   Patient is a 78 year old female with a history of dementia who presents after a fall.  She presents from [facility] after she had an unwitnessed fall.  Patient cannot tell me accurately what happened in the fall.  She does have some noted swelling to her right elbow.  She apparently was reporting some back and hip pain.  C-collar is in place.  Does not appear that she is on anticoagulants.  Per chart review, she has been seen recently for increased swelling of her legs which it sounds like she has had a problem within the past.  She is on torsemide and has had some improvement overall but due to some redness in her legs, was started on doxycycline  yesterday.  She had an ultrasound of her lower extremities on November 6 which was reported to be negative for DVT.       Prior to Admission medications   Medication Sig Start Date End Date Taking? Authorizing Provider  acetaminophen  (TYLENOL ) 325 MG tablet Take 650 mg by mouth every morning.    [provider]  acetaminophen  (TYLENOL ) 500 MG tablet Take 1,000 mg by mouth every 12 (twelve) hours as needed.    [provider]  aspirin  81 MG EC tablet Take 81 mg by mouth daily. Swallow whole.    [provider]  brimonidine-timolol  (COMBIGAN) 0.2-0.5 % ophthalmic solution Place 1 drop into the left eye 2 (two) times daily. 05/20/21   [provider]  cetirizine (ZYRTEC) 10 MG tablet Take 10 mg by mouth daily.    [provider]  dextromethorphan-guaiFENesin (ROBITUSSIN-DM) 10-100 MG/5ML liquid Take 10 mLs by mouth every 6 (six) hours as needed for cough.    [provider]  diclofenac Sodium (VOLTAREN) 1 % GEL Apply topically as needed.    [provider]  dicyclomine   (BENTYL ) 10 MG capsule Take 1 capsule (10 mg total) by mouth every morning. 02/07/24   Charlanne Fredia CROME, MD  divalproex  (DEPAKOTE  ER) 500 MG 24 hr tablet Take 500 mg by mouth every evening. Give 500 mg by mouth in the evening for aggressive behaviors    [provider]  divalproex  (DEPAKOTE ) 250 MG DR tablet Take 250 mg by mouth daily.  Give 250 mg by mouth one time a day for aggressive behaviors    [provider]  doxepin (SINEQUAN) 10 MG capsule Take 25 mg by mouth at bedtime.  Give 1 capsule by mouth at bedtime for insomnia    [provider]  doxycycline  (VIBRA -TABS) 100 MG tablet Take 1 tablet (100 mg total) by mouth 2 (two) times daily for 10 days. 06/08/24 06/18/24  Wert, Christina, NP  fluticasone (FLONASE) 50 MCG/ACT nasal spray Place 1 spray into both nostrils every 12 (twelve) hours as needed for allergies or rhinitis.    [provider]  ibuprofen  (ADVIL ) 200 MG tablet Take 200 mg by mouth every 6 (six) hours as needed.    [provider]  latanoprost (XALATAN) 0.005 % ophthalmic solution Place 1 drop into the left eye at bedtime.    [provider]  LORazepam (ATIVAN) 0.5 MG tablet Take 1 tablet (0.5 mg total) by mouth 3 (three) times daily as needed for anxiety. Please provide transdermal ativan gel. Rub  Ativan 0.5 mg in gel formulation TID as needed for agitation Patient not taking: Reported on 06/05/2024 05/04/24   Wert, Christina, NP  LORazepam (ATIVAN) 1 MG tablet Take 1 mg by mouth 2 (two) times daily as needed for anxiety. Give 1 mg orally as needed for BID PRN for agitation    [provider]  Polyethyl Glycol-Propyl Glycol 0.4-0.3 % SOLN Place 1-2 drops into both eyes daily as needed (for dry eyes).    [provider]  polyethylene glycol (MIRALAX  / GLYCOLAX ) 17 g packet Take 17 g by mouth as needed.    [provider]  potassium chloride  SA (KLOR-CON  M) 20 MEQ tablet Take 1 tablet (20 mEq total) by mouth  daily. 04/09/24   Darlean Maus, NP  pravastatin  (PRAVACHOL ) 10 MG tablet TAKE 1 TABLET BY MOUTH DAILY 07/11/23   Plotnikov, Aleksei V, MD  risperiDONE (RISPERDAL) 0.25 MG tablet Take 0.5 mg by mouth daily.  Give 0.5 mg by mouth two times a day related to DEMENTIA IN OTHER DISEASES CLASSIFIED ELSEWHERE, MODERATE, WITH AGITATION (F02.B11)    [provider]  risperiDONE (RISPERDAL) 1 MG tablet Take 1 mg by mouth at bedtime.    [provider]  rivastigmine  (EXELON ) 3 MG capsule Take 4.5 mg by mouth 2 (two) times daily.    [provider]  senna (SENOKOT) 8.6 MG TABS tablet Take 2 tablets by mouth as needed for mild constipation.    [provider]  sertraline  (ZOLOFT ) 50 MG tablet Take 75 mg by mouth daily.    [provider]  Torsemide 40 MG TABS Take 40 mg by mouth daily. 06/07/24   Charlanne Fredia CROME, MD  traMADol  (ULTRAM ) 50 MG tablet Take by mouth every 8 (eight) hours as needed.    [provider]    Allergies: Donepezil , Namenda  [memantine ], and Latex    Review of Systems  Unable to perform ROS: Dementia    Updated Vital Signs BP 119/61 (BP Location: Left Arm)   Pulse 78   Temp (!) 97.4 F (36.3 C)   Resp 18   Ht 6' 2 (1.88 m)   Wt 109 kg   SpO2 98%   BMI 30.85 kg/m   Physical Exam Constitutional:      Appearance: She is well-developed.  HENT:     Head: Normocephalic and atraumatic.  Eyes:     Pupils: Pupils are equal, round, and reactive to light.  Neck:     Comments: C-collar in place, no tenderness along the cervical, thoracic or lumbosacral spine Cardiovascular:     Rate and Rhythm: Normal rate and regular rhythm.     Heart sounds: Normal heart sounds.     Comments: No tenderness to the rib cage Pulmonary:     Effort: Pulmonary effort is normal. No respiratory distress.     Breath sounds: Normal breath sounds. No wheezing or rales.  Chest:     Chest wall: No tenderness.  Abdominal:     General: Bowel sounds  are normal.     Palpations: Abdomen is soft.     Tenderness: There is no abdominal tenderness. There is no guarding or rebound.  Musculoskeletal:        General: Normal range of motion.     Comments: Patient has large amount of swelling over the olecranon area of her right elbow.  There is no overlying abrasion.  There is no pain to the shoulder or wrist.  Distal pulses are intact.  She has  normal movement in the hand.  There is no other pain on palpation or range of motion of the extremities, including the hips, she does have some edema to the lower extremities bilaterally.  There is some mild warmth and erythema of the lower extremities bilaterally  Lymphadenopathy:     Cervical: No cervical adenopathy.  Skin:    General: Skin is warm and dry.     Findings: No rash.  Neurological:     Mental Status: She is alert and oriented to person, place, and time.     (all labs ordered are listed, but only abnormal results are displayed) Labs Reviewed - No data to display  EKG: None  Radiology: CT Cervical Spine Wo Contrast Result Date: 06/09/2024 EXAM: CT CERVICAL SPINE WITHOUT CONTRAST 06/09/2024 03:58:02 PM TECHNIQUE: CT of the cervical spine was performed without the administration of intravenous contrast. Multiplanar reformatted images are provided for review. Automated exposure control, iterative reconstruction, and/or weight based adjustment of the mA/kV was utilized to reduce the radiation dose to as low as reasonably achievable. COMPARISON: None available. CLINICAL HISTORY: Neck trauma (Age >= 65y) FINDINGS: CERVICAL SPINE: BONES AND ALIGNMENT: Straightening of the normal cervical lordosis. No evidence of traumatic malalignment. DEGENERATIVE CHANGES: Endplate osteophytes at multiple levels, facet arthrosis and uncovertebral hypertrophy at multiple levels. Foraminal stenosis throughout the cervical spine, most pronounced at C5-C6. No significant osseous foraminal stenosis. SOFT TISSUES: No  prevertebral soft tissue swelling. IMPRESSION: 1. No acute abnormality of the cervical spine related to the reported neck trauma. 2. Degenerative changes as above. Electronically signed by: Donnice Mania MD 06/09/2024 04:19 PM EST RP Workstation: [id number]   CT Head Wo Contrast Result Date: 06/09/2024 EXAM: CT HEAD WITHOUT CONTRAST 06/09/2024 03:58:02 PM TECHNIQUE: CT of the head was performed without the administration of intravenous contrast. Automated exposure control, iterative reconstruction, and/or weight based adjustment of the mA/kV was utilized to reduce the radiation dose to as low as reasonably achievable. COMPARISON: MRI brain 06/01/2022. CLINICAL HISTORY: Head trauma, minor (Age >= 65y). FINDINGS: BRAIN AND VENTRICLES: No acute hemorrhage. No evidence of acute infarct. No hydrocephalus. No extra-axial collection. No mass effect or midline shift. Similar mild parenchymal volume loss, cavum septum pellucidum, advertebral. 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 ORBITS: Bilateral lens replacement. SINUSES: Trace fluid in the left mastoid air cells. SOFT TISSUES AND SKULL: No acute soft tissue abnormality. No skull fracture. Atherosclerosis at the skull base. IMPRESSION: 1. No acute intracranial abnormality related to the head trauma. 2. Mild parenchymal volume loss. Electronically signed by: Donnice Mania MD 06/09/2024 04:12 PM EST RP Workstation: [id number]   DG Elbow Complete Right Result Date: 06/09/2024 EXAM: 3 VIEW(S) XRAY OF THE ELBOW COMPARISON: None available. CLINICAL HISTORY: fall, elbow pain FINDINGS: BONES AND JOINTS: No acute fracture. No focal osseous lesion. No joint dislocation. No joint effusion. SOFT TISSUES: Posterior elbow soft tissue swelling consistent with hematoma. IMPRESSION: 1. Posterior elbow soft tissue swelling consistent with hematoma. 2. No acute osseous abnormality. Electronically signed by: Norman Gatlin MD 06/09/2024 02:42 PM EST RP  Workstation: [id number]     Procedures   Medications Ordered in the ED - No data to display                                  Medical Decision Making Amount and/or Complexity of Data Reviewed Radiology: ordered.   Patient is a 78 year old who presents after mechanical  fall.  She reportedly had a head injury.  Her primary injury is to her right elbow.  X-rays of the elbow were interpreted by me and confirmed by the radiologist to show no underlying fracture.  She had a CT scan of her head and cervical spine which showed no evidence of traumatic injury.  She does not have any other apparent injuries on exam.  She has some redness and swelling of her lower legs which appears to be related to chronic edema although she was recently started on doxycycline  yesterday.  She has had an outpatient ultrasound that was reported to be negative for DVT.  She does not have any new or worsening symptoms.  She was discharged home in good condition.  She and her family were advised to follow-up with her primary care doctor for recheck.  Return precautions were given.     Final diagnoses:  Fall, initial encounter  Injury of head, initial encounter  Contusion of right elbow, initial encounter    ED Discharge Orders     None          Lenor Hollering, MD 06/09/24 1646

## 2024-06-09 NOTE — Discharge Instructions (Addendum)
Follow-up with your primary care doctor for recheck.  Return to the emergency room if you have any worsening symptoms.

## 2024-06-09 NOTE — ED Notes (Signed)
 Patient assisted to bedside commode by RN and family. Family remains at bedside with patient at this time and will notify RN to assist patient back to bed.

## 2024-06-09 NOTE — ED Notes (Signed)
 Patient assisted to the bed without incident.

## 2024-06-11 DIAGNOSIS — F02B11 Dementia in other diseases classified elsewhere, moderate, with agitation: Secondary | ICD-10-CM | POA: Diagnosis not present

## 2024-06-11 DIAGNOSIS — R2681 Unsteadiness on feet: Secondary | ICD-10-CM | POA: Diagnosis not present

## 2024-06-11 DIAGNOSIS — R609 Edema, unspecified: Secondary | ICD-10-CM | POA: Diagnosis not present

## 2024-06-11 DIAGNOSIS — R41841 Cognitive communication deficit: Secondary | ICD-10-CM | POA: Diagnosis not present

## 2024-06-12 ENCOUNTER — Non-Acute Institutional Stay (SKILLED_NURSING_FACILITY): Payer: Self-pay | Admitting: Orthopedic Surgery

## 2024-06-12 ENCOUNTER — Encounter: Payer: Self-pay | Admitting: Orthopedic Surgery

## 2024-06-12 DIAGNOSIS — F03918 Unspecified dementia, unspecified severity, with other behavioral disturbance: Secondary | ICD-10-CM

## 2024-06-12 DIAGNOSIS — R41841 Cognitive communication deficit: Secondary | ICD-10-CM | POA: Diagnosis not present

## 2024-06-12 DIAGNOSIS — I519 Heart disease, unspecified: Secondary | ICD-10-CM | POA: Diagnosis not present

## 2024-06-12 DIAGNOSIS — I872 Venous insufficiency (chronic) (peripheral): Secondary | ICD-10-CM

## 2024-06-12 DIAGNOSIS — I081 Rheumatic disorders of both mitral and tricuspid valves: Secondary | ICD-10-CM | POA: Diagnosis not present

## 2024-06-12 DIAGNOSIS — I2510 Atherosclerotic heart disease of native coronary artery without angina pectoris: Secondary | ICD-10-CM | POA: Diagnosis not present

## 2024-06-12 DIAGNOSIS — D649 Anemia, unspecified: Secondary | ICD-10-CM | POA: Diagnosis not present

## 2024-06-12 DIAGNOSIS — R531 Weakness: Secondary | ICD-10-CM | POA: Diagnosis not present

## 2024-06-12 DIAGNOSIS — R2689 Other abnormalities of gait and mobility: Secondary | ICD-10-CM | POA: Diagnosis not present

## 2024-06-12 DIAGNOSIS — R413 Other amnesia: Secondary | ICD-10-CM | POA: Diagnosis not present

## 2024-06-12 DIAGNOSIS — Z9181 History of falling: Secondary | ICD-10-CM | POA: Diagnosis not present

## 2024-06-12 DIAGNOSIS — R2681 Unsteadiness on feet: Secondary | ICD-10-CM | POA: Diagnosis not present

## 2024-06-12 MED ORDER — PANTOPRAZOLE SODIUM 40 MG PO TBEC: 40.0000 mg | DELAYED_RELEASE_TABLET | Freq: Every day | ORAL | Status: DC

## 2024-06-12 NOTE — Progress Notes (Signed)
 Location:  [facility] Room Number: 305 A Place of Service:  SNF [phone]) Provider:  Greig Cluster, NP   Patient Care Team: Charlanne Fredia CROME, MD as PCP - General (Internal Medicine) Rosalie Kitchens, MD as Consulting Physician (Gastroenterology) Marget Lenis, MD as Consulting Physician (Obstetrics and Gynecology) Harvey Seltzer, MD as Consulting Physician (Sports Medicine) Yvone Rush, MD as Consulting Physician (Orthopedic Surgery) Gaston Hamilton, MD as Consulting Physician (Urology) Elisabeth Valli BIRCH, MD as Consulting Physician (Urology) Wertman, Sara E, PA-C (Neurology) Bond, Reyes Mcardle, MD as Referring Physician (Ophthalmology)  Extended Emergency Contact Information Primary Emergency Contact: [facility] [place] [facility] Phone: [phone] Mobile Phone: [phone] Relation: Sister Secondary Emergency Contact: Diehl,Carol Address: [address]          [place], [place] [address] [place] Home Phone: [phone] Mobile Phone: [phone] Relation: Sister  Code Status:  Full Code Goals of care: Advanced Directive information    02/14/2024    3:51 PM  Advanced Directives  Does Patient Have a Medical Advance Directive? Yes  Type of Advance Directive Living will;Healthcare Power of Attorney  Does patient want to make changes to medical advance directive? No - Patient declined  Copy of Healthcare Power of Attorney in Chart? Yes - validated most recent copy scanned in chart (See row information)     Chief Complaint  Patient presents with   Acute Visit    Low hgb    HPI:  Pt is a 78 y.o. female seen today for acute visit due to low hemoglobin.   She currently resides on the memory care unit at [facility]. PMH: CAD, TIA, HLD, diverticulosis, AD, arthritis, anxiety, constipation and urinary urgency/incontinence.   Recently moved to memory care unit. Poor historian due to dementia. Recent hgb 10.0 (11/10)> was  14 (06/26). BUN/creat 25/0.8, GFR 80 05/09/2024. No signs of blood loss per nursing. She is not on PPI. Vitals stable.   11/08 unwitnessed fall with ED evaluation. Xray right elbow with some soft tissue swelling/hematoma, no fracture or dislocation. CT head no acute intracranial abnormality. CT cervical spine no fracture or abnormality. She reports that she cannot walk today. She was noted walking with her sister yesterday. She is able to lift both legs in wheelchair without difficulty. She was able to walk after out encounter ended.    Past Medical History:  Diagnosis Date   Abdominal cramps 08/03/2022   Chronic abdominal cramps - periodic. Worse. Treat constipation.  (Pt saw Dr Rosalie, had an MRI).   Allergic rhinitis 08/03/2022   Chronic runny nose - Atrovent  did not work  Start Loratidine 10 mg/d   Arthritis    Atrophic vaginitis 04/29/2021   9/22  Dr Marget, dr Elisabeth  On estrocream vag 3/wk PV   Bilateral leg weakness 04/06/2013   Present since 2011-2012. She must push up from a chair   CAD (coronary artery disease) 04/29/2021   2021 CT coronary calcium score of 302.   Cont w/Simvastatin  - d/c, ASA  Cont on Pravastatin    Cataract    Both eyes have had lens replacement.   Constipation 08/03/2022   Chronic abdominal cramps - periodic. Worse.  (Pt saw Dr Rosalie, had an MRI).   Diverticulosis of colon 10/02/2010   Dyslipidemia 02/20/2008   Simvastatin      NMR Lipoprofile 2005: LDL particle # 1776/ small dense 1106 on Lipitor 20 mg daily.   MGF MI in 49s; father MI @ 56.  No FH CVA  NMR 05/2011:LDL 78 (1169/542), HDL 41, TG 135. LDL goal < 100, ideally < 70.     [facility] panel: Hyperabsorber of  dietary cholesterol; Lp(a) 112; CRP 2.7     12/19 cardiac CT scan for calcium scoring offered     Cont on Pravastatin       Dysrhythmia    palpitations recently   Elevated blood pressure reading without diagnosis of hypertension 04/10/2010   Generalized anxiety disorder 07/29/2021   12/22   Discussed. Pt declined meds, declined ref to psychology, psychiatry... Try Valerian root for anxiety   Glaucoma 03/17/2011   Groin pain 03/26/2019   8/20 R - ?MSK vs other  R/o hernia  Gen surg ref vs CT discussed  06/2019 CT ok - Dr Rosalie  MSK strain   Heart murmur    History of colonic polyps 03/25/2009       Colonoscopy 2009: Adenomatous polyps, Dr Rosalie.   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   Pigmentary glaucoma of both eyes, mild stage 04/09/2013   Plantar fasciitis, left 06/18/2009   Polycythemia, secondary 04/12/2016   Mild 2016   Pre-diabetes    per patient   Pseudophakia 06/23/2012   TIA (transient ischemic attack) 07/08/2019   12/20 probable-global aphasia, confusion  global aphasia, confusion - no relapse  Doppler (-) B  Card ECHO ordered   Urinary urgency 10/02/2010   Chronic  Dr Marget, Dr McDiarmid d/c. Seeing Dr Elisabeth  ?OAB  Trial of prn Ditropan  2022  Ditropan  po  Try estrocream vag 3/wk PV  On Macrobid qd  10/22 Memory loss is worse.  Likely aggravated by daily use of Ditropan .  She can switch to as needed use.  12/22 on Estrocream and Myrbetriq   Discussed anxiety issues. Pt declined meds, declined ref to psychology, psychiatry...  Try Valerian root for anxie   Varicose veins of lower extremities 07/13/2010   Past Surgical History:  Procedure Laterality Date   ABDOMINAL HYSTERECTOMY     ANTERIOR AND POSTERIOR REPAIR WITH SACROSPINOUS FIXATION N/A 06/05/2015   Procedure: ANTERIOR AND POSTERIOR REPAIR WITH SACROSPINOUS  LIGAMENT SUSPENSION;  Surgeon: Alm Marget, MD;  Location: [facility];  Service: Gynecology;  Laterality: N/A;   CATARACT EXTRACTION, BILATERAL     lens implant   CHOLECYSTECTOMY  1991   CHOLECYSTECTOMY, LAPAROSCOPIC     Dr Merrilyn   COLONOSCOPY  2015   neg; due 2020   colonoscopy with polypectomy     Dr Rosalie; X 3   EYE SURGERY     trabeculectomy ;Dr Marci.Cataract removal OD; Dr Eliza   TOTAL HIP ARTHROPLASTY Right 09/22/2018   Procedure: TOTAL HIP ARTHROPLASTY ANTERIOR APPROACH;  Surgeon: Yvone Rush, MD;  Location: [facility];  Service: Orthopedics;  Laterality: Right;    Allergies  Allergen Reactions   Donepezil      diarrhea   Namenda  [Memantine ]     headache   Latex Rash  Red rash around red bandaid    Outpatient Encounter Medications as of 06/12/2024  Medication Sig   acetaminophen  (TYLENOL ) 325 MG tablet Take 650 mg by mouth every morning.   acetaminophen  (TYLENOL ) 500 MG tablet Take 1,000 mg by mouth every 12 (twelve) hours as needed.   aspirin  81 MG EC tablet Take 81 mg by mouth daily. Swallow whole.   brimonidine-timolol  (COMBIGAN) 0.2-0.5 % ophthalmic solution Place 1 drop into the left eye 2 (two) times daily.   cetirizine (ZYRTEC) 10 MG tablet Take 10 mg by mouth daily.   dextromethorphan-guaiFENesin (ROBITUSSIN-DM) 10-100 MG/5ML liquid Take 10 mLs by mouth every 6 (six) hours as needed for cough.   diclofenac Sodium (VOLTAREN) 1 % GEL Apply topically as needed.   dicyclomine  (BENTYL ) 10 MG capsule Take 1 capsule (10 mg total) by mouth every morning.   divalproex  (DEPAKOTE  ER) 500 MG 24 hr tablet Take 500 mg by mouth every evening. Give 500 mg by mouth in the evening for aggressive behaviors   divalproex  (DEPAKOTE ) 250 MG DR tablet Take 250 mg by mouth daily.  Give 250 mg by mouth one time a day for aggressive behaviors   doxepin (SINEQUAN) 10 MG capsule Take 25 mg by mouth at bedtime.  Give 1 capsule by mouth at bedtime for insomnia   doxycycline  (VIBRA -TABS) 100 MG  tablet Take 1 tablet (100 mg total) by mouth 2 (two) times daily for 10 days.   fluticasone (FLONASE) 50 MCG/ACT nasal spray Place 1 spray into both nostrils every 12 (twelve) hours as needed for allergies or rhinitis.   ibuprofen  (ADVIL ) 200 MG tablet Take 200 mg by mouth every 6 (six) hours as needed.   latanoprost (XALATAN) 0.005 % ophthalmic solution Place 1 drop into the left eye at bedtime.   LORazepam (ATIVAN) 1 MG tablet Take 1 mg by mouth 2 (two) times daily as needed for anxiety. Give 1 mg orally as needed for BID PRN for agitation   Polyethyl Glycol-Propyl Glycol 0.4-0.3 % SOLN Place 1-2 drops into both eyes daily as needed (for dry eyes).   polyethylene glycol (MIRALAX  / GLYCOLAX ) 17 g packet Take 17 g by mouth as needed.   potassium chloride  SA (KLOR-CON  M) 20 MEQ tablet Take 1 tablet (20 mEq total) by mouth daily.   pravastatin  (PRAVACHOL ) 10 MG tablet TAKE 1 TABLET BY MOUTH DAILY   risperiDONE (RISPERDAL) 0.25 MG tablet Take 0.5 mg by mouth daily.  Give 0.5 mg by mouth two times a day related to DEMENTIA IN OTHER DISEASES CLASSIFIED ELSEWHERE, MODERATE, WITH AGITATION (F02.B11)   risperiDONE (RISPERDAL) 1 MG tablet Take 1 mg by mouth at bedtime.   rivastigmine  (EXELON ) 3 MG capsule Take 4.5 mg by mouth 2 (two) times daily.   senna (SENOKOT) 8.6 MG TABS tablet Take 2 tablets by mouth as needed for mild constipation.   sertraline  (ZOLOFT ) 50 MG tablet Take 75 mg by mouth daily.   Torsemide 40 MG TABS Take 40 mg by mouth daily.   traMADol  (ULTRAM ) 50 MG tablet Take by mouth every 8 (eight) hours as needed.   LORazepam (ATIVAN) 0.5 MG tablet Take 1 tablet (0.5 mg total) by mouth 3 (three) times daily as needed for anxiety. Please provide transdermal ativan gel. Rub Ativan 0.5 mg in gel formulation TID as needed for agitation (Patient not taking: Reported on 06/12/2024)   No facility-administered encounter medications on file as of 06/12/2024.    Review of Systems  Unable to perform  ROS: Dementia  Immunization History  Administered Date(s) Administered    sv, Bivalent, Protein Subunit Rsvpref,pf (Abrysvo) 08/11/2022   Fluad  Quad(high Dose 65+) 03/26/2019, 04/23/2020, 04/29/2021, 05/03/2022, 05/10/2023   Fluad  Trivalent(High Dose 65+) 05/05/2023   INFLUENZA, HIGH DOSE SEASONAL PF 04/11/2014, 04/12/2016, 04/13/2017   Influenza Whole 05/02/2012, 05/02/2013   Influenza-Unspecified 04/27/2015, 05/12/2018   Moderna SARS-COV2 Booster Vaccination 06/17/2020, 12/18/2020   Moderna Sars-Covid-2 Vaccination 08/14/2019, 09/12/2019   Pneumococcal Conjugate-13 04/11/2015   Pneumococcal Polysaccharide-23 03/17/2011   Td 02/20/2008   Tdap 06/26/2019   Unspecified SARS-COV-2 Vaccination 08/14/2019, 09/12/2019, 06/17/2020, 12/18/2020, 06/02/2022, 11/25/2022, 05/05/2023   Zoster Recombinant(Shingrix) 09/08/2017, 11/24/2017   Zoster, Live 08/02/2010   Pertinent  Health Maintenance Due  Topic Date Due   Influenza Vaccine  06/29/2024 (Originally 03/02/2024)   DEXA SCAN  Completed   Mammogram  Discontinued   Colonoscopy  Discontinued      09/14/2023    2:16 PM 12/28/2023    3:44 PM 02/27/2024    3:05 PM 04/03/2024    6:54 PM 04/24/2024    2:52 PM  Fall Risk  Falls in the past year? 0 0 0 0 0  Was there an injury with Fall? 0 0 0 0 0  Fall Risk Category Calculator 0 0 0 0 0  Patient at Risk for Falls Due to No Fall Risks   History of fall(s) History of fall(s)  Fall risk Follow up Falls evaluation completed;Falls prevention discussed Falls evaluation completed Falls evaluation completed Falls evaluation completed Falls evaluation completed   Functional Status Survey:    Vitals:   06/12/24 1135  BP: 119/71  Pulse: 80  Resp: 18  Temp: 97.9 F (36.6 C)  SpO2: 95%  Height: 6' 2 (1.88 m)   Body mass index is 30.85 kg/m. Physical Exam Vitals reviewed.  Constitutional:      General: She is not in acute distress. HENT:     Head: Normocephalic.  Eyes:     General:         Right eye: No discharge.        Left eye: No discharge.  Cardiovascular:     Rate and Rhythm: Normal rate and regular rhythm.     Pulses: Normal pulses.     Heart sounds: Normal heart sounds.  Pulmonary:     Effort: Pulmonary effort is normal.     Breath sounds: Normal breath sounds.  Abdominal:     General: Bowel sounds are normal. There is no distension.     Palpations: Abdomen is soft.     Tenderness: There is no abdominal tenderness.  Musculoskeletal:     Cervical back: Neck supple.     Right lower leg: Edema present.     Left lower leg: Edema present.  Skin:    General: Skin is warm.     Capillary Refill: Capillary refill takes less than 2 seconds.  Neurological:     General: No focal deficit present.     Mental Status: She is alert.     Gait: Gait abnormal.  Psychiatric:     Comments: Alert to self/familiar face, follows come commands     Labs reviewed: Recent Labs    10/04/23 1612 01/26/24 0000 04/27/24 0000 05/07/24 0000 05/09/24 0000  NA 142   < > 141 143 141  K 4.4   < > 4.2 4.1 4.3  CL 106   < > 105 106 105  CO2 30   < > 23* 25* 26*  GLUCOSE 98  --   --   --   --  BUN 25*   < > 34* 26* 25*  CREATININE 0.81   < > 0.7 0.8 0.8  CALCIUM 10.0   < > 9.2 8.9 8.9   < > = values in this interval not displayed.   Recent Labs    10/04/23 1612 01/26/24 0000 04/03/24 0000 05/07/24 0000  AST 15 20 21 18   ALT 11 12 14 14   ALKPHOS 53 63 63 62  BILITOT 0.6  --   --   --   PROT 7.3  --   --   --   ALBUMIN 4.3 4.1 4.0 3.5   Recent Labs    10/04/23 1612 01/26/24 0000  WBC 7.9 6.4  NEUTROABS 4.6  --   HGB 14.9 14.1  HCT 44.5 43  MCV 91.7  --   PLT 262.0 262   Lab Results  Component Value Date   TSH 3.24 04/03/2024   Lab Results  Component Value Date   HGBA1C 5.9 10/04/2023   Lab Results  Component Value Date   CHOL 169 01/28/2022   HDL 54.40 01/28/2022   LDLCALC 89 01/28/2022   LDLDIRECT 85.6 02/20/2008   TRIG 127.0 01/28/2022   CHOLHDL 3  01/28/2022    Significant Diagnostic Results in last 30 days:  CT Cervical Spine Wo Contrast Result Date: 06/09/2024 EXAM: CT CERVICAL SPINE WITHOUT CONTRAST 06/09/2024 03:58:02 PM TECHNIQUE: CT of the cervical spine was performed without the administration of intravenous contrast. Multiplanar reformatted images are provided for review. Automated exposure control, iterative reconstruction, and/or weight based adjustment of the mA/kV was utilized to reduce the radiation dose to as low as reasonably achievable. COMPARISON: None available. CLINICAL HISTORY: Neck trauma (Age >= 65y) FINDINGS: CERVICAL SPINE: BONES AND ALIGNMENT: Straightening of the normal cervical lordosis. No evidence of traumatic malalignment. DEGENERATIVE CHANGES: Endplate osteophytes at multiple levels, facet arthrosis and uncovertebral hypertrophy at multiple levels. Foraminal stenosis throughout the cervical spine, most pronounced at C5-C6. No significant osseous foraminal stenosis. SOFT TISSUES: No prevertebral soft tissue swelling. IMPRESSION: 1. No acute abnormality of the cervical spine related to the reported neck trauma. 2. Degenerative changes as above. Electronically signed by: Donnice Mania MD 06/09/2024 04:19 PM EST RP Workstation: [id number]   CT Head Wo Contrast Result Date: 06/09/2024 EXAM: CT HEAD WITHOUT CONTRAST 06/09/2024 03:58:02 PM TECHNIQUE: CT of the head was performed without the administration of intravenous contrast. Automated exposure control, iterative reconstruction, and/or weight based adjustment of the mA/kV was utilized to reduce the radiation dose to as low as reasonably achievable. COMPARISON: MRI brain 06/01/2022. CLINICAL HISTORY: Head trauma, minor (Age >= 65y). FINDINGS: BRAIN AND VENTRICLES: No acute hemorrhage. No evidence of acute infarct. No hydrocephalus. No extra-axial collection. No mass effect or midline shift. Similar mild parenchymal volume loss, cavum septum pellucidum, advertebral. 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 ORBITS: Bilateral lens replacement. SINUSES: Trace fluid in the left mastoid air cells. SOFT TISSUES AND SKULL: No acute soft tissue abnormality. No skull fracture. Atherosclerosis at the skull base. IMPRESSION: 1. No acute intracranial abnormality related to the head trauma. 2. Mild parenchymal volume loss. Electronically signed by: Donnice Mania MD 06/09/2024 04:12 PM EST RP Workstation: [id number]   DG Elbow Complete Right Result Date: 06/09/2024 EXAM: 3 VIEW(S) XRAY OF THE ELBOW COMPARISON: None available. CLINICAL HISTORY: fall, elbow pain FINDINGS: BONES AND JOINTS: No acute fracture. No focal osseous lesion. No joint dislocation. No joint effusion. SOFT TISSUES: Posterior elbow soft tissue swelling consistent with hematoma. IMPRESSION:  1. Posterior elbow soft tissue swelling consistent with hematoma. 2. No acute osseous abnormality. Electronically signed by: Norman Gatlin MD 06/09/2024 02:42 PM EST RP Workstation: [id number]    Assessment/Plan 1. Low hemoglobin (Primary) -hgb 10.0 (11/10)> was 14 (06/26) - no sign of blood loss - hemoccults x 3  - start pantoprazole 40 mg daily - repeat cbc/diff in 2 weeks  2. Dementia with behavioral disturbance (HCC) - recently moved to memory care - intermittent behaviors - followed by Dr. Tasia - unable to take Rexulti  due to cost - unstable gait> ? Progression - cont Depakote , Doxepin, Exelon , Risperidone and ativan  3. Generalized weakness - 11/08 unwitnessed fall with ED evaluation - now having trouble walking - ? Dementia versus weakness - PT/OT evaluation   4. Chronic venous insufficiency of lower extremity - ongoing  - BLE 2-3 + pitting with weeping  - cont leg wraps and leg elevation - cont torsemide    Family/ staff Communication: plan discussed with patient and nurse  Labs/tests ordered:  PT/OT evaluation, hemoccult x 3, cbc/diff in 2 weeks

## 2024-06-13 DIAGNOSIS — R413 Other amnesia: Secondary | ICD-10-CM | POA: Diagnosis not present

## 2024-06-13 DIAGNOSIS — Z9181 History of falling: Secondary | ICD-10-CM | POA: Diagnosis not present

## 2024-06-13 DIAGNOSIS — R2681 Unsteadiness on feet: Secondary | ICD-10-CM | POA: Diagnosis not present

## 2024-06-13 DIAGNOSIS — R2689 Other abnormalities of gait and mobility: Secondary | ICD-10-CM | POA: Diagnosis not present

## 2024-06-13 DIAGNOSIS — R41841 Cognitive communication deficit: Secondary | ICD-10-CM | POA: Diagnosis not present

## 2024-06-15 DIAGNOSIS — Z9181 History of falling: Secondary | ICD-10-CM | POA: Diagnosis not present

## 2024-06-15 DIAGNOSIS — R2689 Other abnormalities of gait and mobility: Secondary | ICD-10-CM | POA: Diagnosis not present

## 2024-06-15 DIAGNOSIS — R2681 Unsteadiness on feet: Secondary | ICD-10-CM | POA: Diagnosis not present

## 2024-06-15 DIAGNOSIS — R41841 Cognitive communication deficit: Secondary | ICD-10-CM | POA: Diagnosis not present

## 2024-06-15 DIAGNOSIS — R413 Other amnesia: Secondary | ICD-10-CM | POA: Diagnosis not present

## 2024-06-18 ENCOUNTER — Telehealth: Payer: Self-pay | Admitting: Adult Health

## 2024-06-18 DIAGNOSIS — R413 Other amnesia: Secondary | ICD-10-CM | POA: Diagnosis not present

## 2024-06-18 DIAGNOSIS — R2689 Other abnormalities of gait and mobility: Secondary | ICD-10-CM | POA: Diagnosis not present

## 2024-06-18 DIAGNOSIS — R41841 Cognitive communication deficit: Secondary | ICD-10-CM | POA: Diagnosis not present

## 2024-06-18 DIAGNOSIS — Z9181 History of falling: Secondary | ICD-10-CM | POA: Diagnosis not present

## 2024-06-18 DIAGNOSIS — R2681 Unsteadiness on feet: Secondary | ICD-10-CM | POA: Diagnosis not present

## 2024-06-18 NOTE — Telephone Encounter (Signed)
 2 D echo returned Normal LV function EF 60-65% Mild hypertrophy of the left ventricle Mild mitral regurg Structurally normal tricuspid valve with mild regurg Normal aortic valve Grade 1 diastolic dysfunction noted.   There hemoccults completed and were negative.

## 2024-06-19 DIAGNOSIS — R413 Other amnesia: Secondary | ICD-10-CM | POA: Diagnosis not present

## 2024-06-19 DIAGNOSIS — Z9181 History of falling: Secondary | ICD-10-CM | POA: Diagnosis not present

## 2024-06-19 DIAGNOSIS — R41841 Cognitive communication deficit: Secondary | ICD-10-CM | POA: Diagnosis not present

## 2024-06-19 DIAGNOSIS — R2689 Other abnormalities of gait and mobility: Secondary | ICD-10-CM | POA: Diagnosis not present

## 2024-06-19 DIAGNOSIS — R2681 Unsteadiness on feet: Secondary | ICD-10-CM | POA: Diagnosis not present

## 2024-06-20 ENCOUNTER — Encounter: Payer: Self-pay | Admitting: Internal Medicine

## 2024-06-20 DIAGNOSIS — R41841 Cognitive communication deficit: Secondary | ICD-10-CM | POA: Diagnosis not present

## 2024-06-20 DIAGNOSIS — R413 Other amnesia: Secondary | ICD-10-CM | POA: Diagnosis not present

## 2024-06-20 DIAGNOSIS — R2681 Unsteadiness on feet: Secondary | ICD-10-CM | POA: Diagnosis not present

## 2024-06-20 DIAGNOSIS — R2689 Other abnormalities of gait and mobility: Secondary | ICD-10-CM | POA: Diagnosis not present

## 2024-06-20 DIAGNOSIS — Z9181 History of falling: Secondary | ICD-10-CM | POA: Diagnosis not present

## 2024-06-21 ENCOUNTER — Encounter: Payer: Self-pay | Admitting: Physician Assistant

## 2024-06-21 DIAGNOSIS — R413 Other amnesia: Secondary | ICD-10-CM | POA: Diagnosis not present

## 2024-06-21 DIAGNOSIS — R2689 Other abnormalities of gait and mobility: Secondary | ICD-10-CM | POA: Diagnosis not present

## 2024-06-21 DIAGNOSIS — Z9181 History of falling: Secondary | ICD-10-CM | POA: Diagnosis not present

## 2024-06-21 DIAGNOSIS — R2681 Unsteadiness on feet: Secondary | ICD-10-CM | POA: Diagnosis not present

## 2024-06-21 DIAGNOSIS — R41841 Cognitive communication deficit: Secondary | ICD-10-CM | POA: Diagnosis not present

## 2024-06-22 DIAGNOSIS — Z9181 History of falling: Secondary | ICD-10-CM | POA: Diagnosis not present

## 2024-06-22 DIAGNOSIS — R2689 Other abnormalities of gait and mobility: Secondary | ICD-10-CM | POA: Diagnosis not present

## 2024-06-22 DIAGNOSIS — R41841 Cognitive communication deficit: Secondary | ICD-10-CM | POA: Diagnosis not present

## 2024-06-22 DIAGNOSIS — R413 Other amnesia: Secondary | ICD-10-CM | POA: Diagnosis not present

## 2024-06-22 DIAGNOSIS — R2681 Unsteadiness on feet: Secondary | ICD-10-CM | POA: Diagnosis not present

## 2024-06-25 ENCOUNTER — Encounter: Admitting: Adult Health

## 2024-06-25 ENCOUNTER — Encounter: Payer: Self-pay | Admitting: Internal Medicine

## 2024-06-25 ENCOUNTER — Non-Acute Institutional Stay (SKILLED_NURSING_FACILITY): Payer: Self-pay | Admitting: Internal Medicine

## 2024-06-25 DIAGNOSIS — R41841 Cognitive communication deficit: Secondary | ICD-10-CM | POA: Diagnosis not present

## 2024-06-25 DIAGNOSIS — Z9181 History of falling: Secondary | ICD-10-CM | POA: Diagnosis not present

## 2024-06-25 DIAGNOSIS — F02B18 Dementia in other diseases classified elsewhere, moderate, with other behavioral disturbance: Secondary | ICD-10-CM | POA: Diagnosis not present

## 2024-06-25 DIAGNOSIS — D649 Anemia, unspecified: Secondary | ICD-10-CM | POA: Diagnosis not present

## 2024-06-25 DIAGNOSIS — F3341 Major depressive disorder, recurrent, in partial remission: Secondary | ICD-10-CM

## 2024-06-25 DIAGNOSIS — F5101 Primary insomnia: Secondary | ICD-10-CM

## 2024-06-25 DIAGNOSIS — G301 Alzheimer's disease with late onset: Secondary | ICD-10-CM | POA: Diagnosis not present

## 2024-06-25 DIAGNOSIS — R2681 Unsteadiness on feet: Secondary | ICD-10-CM | POA: Diagnosis not present

## 2024-06-25 DIAGNOSIS — R2689 Other abnormalities of gait and mobility: Secondary | ICD-10-CM | POA: Diagnosis not present

## 2024-06-25 DIAGNOSIS — R600 Localized edema: Secondary | ICD-10-CM | POA: Diagnosis not present

## 2024-06-25 DIAGNOSIS — R413 Other amnesia: Secondary | ICD-10-CM | POA: Diagnosis not present

## 2024-06-25 DIAGNOSIS — F332 Major depressive disorder, recurrent severe without psychotic features: Secondary | ICD-10-CM | POA: Diagnosis not present

## 2024-06-25 MED ORDER — TRAMADOL HCL 50 MG PO TABS: 50.0000 mg | ORAL_TABLET | Freq: Every evening | ORAL | Status: AC

## 2024-06-25 NOTE — Progress Notes (Signed)
 Provider:   Location:  Medical Illustrator of Service:  SNF (31)  PCP: Charlanne Fredia CROME, MD Patient Care Team: Charlanne Fredia CROME, MD as PCP - General (Internal Medicine) Rosalie Kitchens, MD as Consulting Physician (Gastroenterology) Marget Lenis, MD as Consulting Physician (Obstetrics and Gynecology) Harvey Seltzer, MD as Consulting Physician (Sports Medicine) Yvone Rush, MD as Consulting Physician (Orthopedic Surgery) Gaston Hamilton, MD as Consulting Physician (Urology) Elisabeth Valli BIRCH, MD as Consulting Physician (Urology) Wertman, Sara E, PA-C (Neurology) Bond, Reyes Mcardle, MD as Referring Physician (Ophthalmology)  Extended Emergency Contact Information Primary Emergency Contact: [facility] [place] Home Phone: [phone] Mobile Phone: [phone] Relation: Sister Secondary Emergency Contact: Diehl,Carol Address: [address]          [place], [place] [address] [place] Home Phone: [phone] Mobile Phone: [phone] Relation: Sister  Code Status: Full Code Goals of Care: Advanced Directive information    02/14/2024    3:51 PM  Advanced Directives  Does Patient Have a Medical Advance Directive? Yes  Type of Advance Directive Living will;Healthcare Power of Attorney  Does patient want to make changes to medical advance directive? No - Patient declined  Copy of Healthcare Power of Attorney in Chart? Yes - validated most recent copy scanned in chart (See row information)      Chief Complaint  Patient presents with   New Admit To SNF    HPI: Patient is a 78 y.o. female seen today for admission to Memory Unit  Admitted [facility] SNF from [facility] due to needing more help with her ADLS and Also with her Behaviors  her sister who lives in [place] is [place] Patient has never been married and has no kids  Her Acute issues Bilateral Edema Treated with Doxycyline for Redness Also Got Echo Which EF of 60 % with LVH Now  on Torsemide   Doing well Has lost weight BUN and Creat Stable  Anemia HGB dropped from 14 to 10 Stool Occult pending Repeat CBC Pending   Worsening dementia With behaviors ith MRI done in 2023 shows atrophy and chronic lacunar infarct. Her recent MMSE at neurology office was 16/30  Has seen Dr. Tasia is on Risperdal and doxepin could not do Rexulti  due to cost Also on Depakote  Doing well in Memory unit  Right lower quadrant pain Has been chronic CT Scan negative Dr Rosalie does not think she needs more aggressive work up On Bentyl   Did not have any acute issues today Walking with holding furniture No Behaviors Seemed Stable  No recent Weight as she has Refused   Past Medical History:  Diagnosis Date   Abdominal cramps 08/03/2022   Chronic abdominal cramps - periodic. Worse. Treat constipation.  (Pt saw Dr Rosalie, had an MRI).   Allergic rhinitis 08/03/2022   Chronic runny nose - Atrovent  did not work  Start Loratidine 10 mg/d   Arthritis    Atrophic vaginitis 04/29/2021   9/22  Dr Marget, dr Elisabeth  On estrocream vag 3/wk PV   Bilateral leg weakness 04/06/2013   Present since 2011-2012. She must push up from a chair   CAD (coronary artery disease) 04/29/2021   2021 CT coronary calcium score of 302.   Cont w/Simvastatin  - d/c, ASA  Cont on Pravastatin    Cataract    Both eyes have had lens replacement.   Constipation 08/03/2022   Chronic abdominal cramps - periodic. Worse.  (Pt saw Dr Rosalie, had an MRI).   Diverticulosis of  colon 10/02/2010   Dyslipidemia 02/20/2008   Simvastatin      NMR Lipoprofile 2005: LDL particle # 1776/ small dense 1106 on Lipitor 20 mg daily.   MGF MI in 54s; father MI @ 25.  No FH CVA  NMR 05/2011:LDL 78 (1169/542), HDL 41, TG 135. LDL goal < 100, ideally < 70.     [facility] panel: Hyperabsorber of  dietary cholesterol; Lp(a) 112; CRP 2.7     12/19 cardiac CT scan for calcium scoring offered     Cont on Pravastatin       Dysrhythmia     palpitations recently   Elevated blood pressure reading without diagnosis of hypertension 04/10/2010   Generalized anxiety disorder 07/29/2021   12/22  Discussed. Pt declined meds, declined ref to psychology, psychiatry... Try Valerian root for anxiety   Glaucoma 03/17/2011   Groin pain 03/26/2019   8/20 R - ?MSK vs other  R/o hernia  Gen surg ref vs CT discussed  06/2019 CT ok - Dr Rosalie  MSK strain   Heart murmur    History of colonic polyps 03/25/2009       Colonoscopy 2009: Adenomatous polyps, Dr Rosalie.   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   Pigmentary glaucoma of both eyes, mild stage 04/09/2013   Plantar fasciitis, left 06/18/2009   Polycythemia, secondary 04/12/2016   Mild 2016   Pre-diabetes    per patient   Pseudophakia 06/23/2012   TIA (transient ischemic attack) 07/08/2019   12/20 probable-global aphasia, confusion  global aphasia, confusion - no relapse  Doppler (-) B  Card ECHO ordered   Urinary urgency 10/02/2010   Chronic  Dr Marget, Dr McDiarmid d/c. Seeing Dr Elisabeth  ?OAB  Trial of prn Ditropan  2022  Ditropan  po  Try estrocream vag 3/wk PV  On Macrobid qd  10/22 Memory loss is worse.  Likely aggravated by daily use of Ditropan .  She can switch to as needed use.  12/22 on Estrocream and Myrbetriq   Discussed anxiety issues. Pt declined meds, declined ref to psychology, psychiatry...  Try Valerian root for anxie   Varicose veins of lower extremities 07/13/2010   Past Surgical History:  Procedure Laterality Date    ABDOMINAL HYSTERECTOMY     ANTERIOR AND POSTERIOR REPAIR WITH SACROSPINOUS FIXATION N/A 06/05/2015   Procedure: ANTERIOR AND POSTERIOR REPAIR WITH SACROSPINOUS LIGAMENT SUSPENSION;  Surgeon: Alm Marget, MD;  Location: [facility];  Service: Gynecology;  Laterality: N/A;   CATARACT EXTRACTION, BILATERAL     lens implant   CHOLECYSTECTOMY  1991   CHOLECYSTECTOMY, LAPAROSCOPIC     Dr Merrilyn   COLONOSCOPY  2015   neg; due 2020   colonoscopy with polypectomy     Dr Rosalie; X 3   EYE SURGERY     trabeculectomy ;Dr Marci.Cataract removal OD; Dr Eliza   TOTAL HIP ARTHROPLASTY Right 09/22/2018   Procedure: TOTAL HIP ARTHROPLASTY ANTERIOR APPROACH;  Surgeon: Yvone,  Norleen, MD;  Location: [facility];  Service: Orthopedics;  Laterality: Right;    reports that she has never smoked. She has never used smokeless tobacco. She reports that she does not currently use alcohol. She reports that she does not use drugs. Social History   Socioeconomic History   Marital status: Single    Spouse name: Not on file   Number of children: 0   Years of education: 16   Highest education level: Bachelor's degree (e.g., BA, AB, BS)  Occupational History   OccupationPublishing Rights Manager of shows, semi-retired  Tobacco Use   Smoking status: Never   Smokeless tobacco: Never  Vaping Use   Vaping status: Never Used  Substance and Sexual Activity   Alcohol use: Not Currently    Comment:  rarely   Drug use: No   Sexual activity: Not Currently  Other Topics Concern   Not on file  Social History Narrative   Right handed   Drinks caffeine prn    One story home [place]   No children   retired   Chief Executive Officer Drivers of [employer] Strain: Low Risk  (09/14/2023)   Overall Financial Resource Strain (CARDIA)    Difficulty of Paying Living Expenses: Not hard at all  Food Insecurity: No Food Insecurity (09/14/2023)   Hunger Vital Sign    Worried About Running Out of Food in the Last Year: Never true    Ran Out of  Food in the Last Year: Never true  Transportation Needs: No Transportation Needs (09/14/2023)   PRAPARE - Administrator, Civil Service (Medical): No    Lack of Transportation (Non-Medical): No  Physical Activity: Insufficiently Active (09/14/2023)   Exercise Vital Sign    Days of Exercise per Week: 7 days    Minutes of Exercise per Session: 10 min  Stress: No Stress Concern Present (09/14/2023)   [employer] of Occupational Health - Occupational Stress Questionnaire    Feeling of Stress : Not at all  Social Connections: Socially Isolated (09/14/2023)   Social Connection and Isolation Panel    Frequency of Communication with Friends and Family: Once a week    Frequency of Social Gatherings with Friends and Family: Once a week    Attends Religious Services: Never    Database Administrator or Organizations: No    Attends Banker Meetings: Never    Marital Status: Never married  Intimate Partner Violence: Not At Risk (09/14/2023)   Humiliation, Afraid, Rape, and Kick questionnaire    Fear of Current or Ex-Partner: No    Emotionally Abused: No    Physically Abused: No    Sexually Abused: No    Functional Status Survey:    Family History  Problem Relation Age of Onset   Polycythemia Father        transition into leukemia   Hypokalemia Father    Leukemia Father    Glaucoma Father    Heart attack Father 80       in context hypokalemia   Cancer Father    Hearing loss Father    Heart disease Father    Varicose Veins Father    Lung cancer Mother        smoker   Arthritis Mother    Cancer Mother    Varicose Veins Mother    Heart attack Maternal Grandfather 42   Arthritis Maternal Grandfather    Heart disease Maternal Grandfather    Breast cancer Maternal Grandmother  Arthritis Maternal Grandmother    Cancer Maternal Grandmother    Varicose Veins Maternal Grandmother    Melanoma Brother    Kidney disease Paternal Grandfather    Cancer Brother     Diabetes Neg Hx    Stroke Neg Hx     Health Maintenance  Topic Date Due   COVID-19 Vaccine (13 - 2025-26 season) 08/02/2024   Medicare Annual Wellness (AWV)  09/13/2024   DTaP/Tdap/Td (3 - Td or Tdap) 06/25/2029   Pneumococcal Vaccine: 50+ Years  Completed   Influenza Vaccine  Completed   Bone Density Scan  Completed   Hepatitis C Screening  Completed   Zoster Vaccines- Shingrix  Completed   Meningococcal B Vaccine  Aged Out   Mammogram  Discontinued   Colonoscopy  Discontinued    Allergies  Allergen Reactions   Donepezil      diarrhea   Namenda  [Memantine ]     headache   Latex Rash    Red rash around red bandaid    Outpatient Encounter Medications as of 06/25/2024  Medication Sig   acetaminophen  (TYLENOL ) 325 MG tablet Take 650 mg by mouth every morning.   acetaminophen  (TYLENOL ) 500 MG tablet Take 1,000 mg by mouth every 12 (twelve) hours as needed.   aspirin  81 MG EC tablet Take 81 mg by mouth daily. Swallow whole.   brimonidine-timolol  (COMBIGAN) 0.2-0.5 % ophthalmic solution Place 1 drop into the left eye 2 (two) times daily.   cetirizine (ZYRTEC) 10 MG tablet Take 10 mg by mouth daily.   dextromethorphan-guaiFENesin (ROBITUSSIN-DM) 10-100 MG/5ML liquid Take 10 mLs by mouth every 6 (six) hours as needed for cough.   diclofenac Sodium (VOLTAREN) 1 % GEL Apply topically as needed.   dicyclomine  (BENTYL ) 10 MG capsule Take 1 capsule (10 mg total) by mouth every morning.   divalproex  (DEPAKOTE  ER) 500 MG 24 hr tablet Take 500 mg by mouth every evening. Give 500 mg by mouth in the evening for aggressive behaviors   divalproex  (DEPAKOTE ) 250 MG DR tablet Take 250 mg by mouth daily.  Give 250 mg by mouth one time a day for aggressive behaviors   doxepin (SINEQUAN) 10 MG capsule Take 25 mg by mouth at bedtime.  Give 1 capsule by mouth at bedtime for insomnia   fluticasone (FLONASE) 50 MCG/ACT nasal spray Place 1 spray into both nostrils every 12 (twelve) hours as needed for  allergies or rhinitis.   ibuprofen  (ADVIL ) 200 MG tablet Take 200 mg by mouth every 6 (six) hours as needed.   latanoprost (XALATAN) 0.005 % ophthalmic solution Place 1 drop into the left eye at bedtime.   LORazepam  (ATIVAN ) 0.5 MG tablet Take 1 tablet (0.5 mg total) by mouth 3 (three) times daily as needed for anxiety. Please provide transdermal ativan  gel. Rub Ativan  0.5 mg in gel formulation TID as needed for agitation (Patient not taking: Reported on 06/12/2024)   LORazepam  (ATIVAN ) 1 MG tablet Take 1 mg by mouth 2 (two) times daily as needed for anxiety. Give 1 mg orally as needed for BID PRN for agitation   pantoprazole  (PROTONIX ) 40 MG tablet Take 1 tablet (40 mg total) by mouth daily.   Polyethyl Glycol-Propyl Glycol 0.4-0.3 % SOLN Place 1-2 drops into both eyes daily as needed (for dry eyes).   polyethylene glycol (MIRALAX  / GLYCOLAX ) 17 g packet Take 17 g by mouth as needed.   potassium chloride  SA (KLOR-CON  M) 20 MEQ tablet Take 1 tablet (20 mEq total) by mouth daily.  pravastatin  (PRAVACHOL ) 10 MG tablet TAKE 1 TABLET BY MOUTH DAILY   risperiDONE (RISPERDAL) 0.25 MG tablet Take 0.5 mg by mouth daily.  Give 0.5 mg by mouth two times a day related to DEMENTIA IN OTHER DISEASES CLASSIFIED ELSEWHERE, MODERATE, WITH AGITATION (F02.B11)   risperiDONE (RISPERDAL) 1 MG tablet Take 1 mg by mouth at bedtime.   rivastigmine  (EXELON ) 3 MG capsule Take 4.5 mg by mouth 2 (two) times daily.   senna (SENOKOT) 8.6 MG TABS tablet Take 2 tablets by mouth as needed for mild constipation.   sertraline  (ZOLOFT ) 50 MG tablet Take 75 mg by mouth daily.   Torsemide  40 MG TABS Take 40 mg by mouth daily.   traMADol  (ULTRAM ) 50 MG tablet Take by mouth every 8 (eight) hours as needed.   No facility-administered encounter medications on file as of 06/25/2024.    Review of Systems  Unable to perform ROS: Dementia    There were no vitals filed for this visit. There is no height or weight on file to  calculate BMI. Physical Exam Vitals reviewed.  Constitutional:      Appearance: Normal appearance.  HENT:     Head: Normocephalic.     Nose: Nose normal.     Mouth/Throat:     Mouth: Mucous membranes are moist.     Pharynx: Oropharynx is clear.  Eyes:     Pupils: Pupils are equal, round, and reactive to light.  Cardiovascular:     Rate and Rhythm: Normal rate and regular rhythm.     Pulses: Normal pulses.     Heart sounds: Normal heart sounds. No murmur heard. Pulmonary:     Effort: Pulmonary effort is normal.     Breath sounds: Normal breath sounds.  Abdominal:     General: Abdomen is flat. Bowel sounds are normal.     Palpations: Abdomen is soft.  Musculoskeletal:        General: Swelling present.     Cervical back: Neck supple.     Comments: Swelling and redness is much Improved Just has Dry Skin  Skin:    General: Skin is warm.  Neurological:     General: No focal deficit present.     Mental Status: She is alert.  Psychiatric:        Mood and Affect: Mood normal.        Thought Content: Thought content normal.     Labs reviewed: Basic Metabolic Panel: Recent Labs    10/04/23 1612 01/26/24 0000 04/27/24 0000 05/07/24 0000 05/09/24 0000  NA 142   < > 141 143 141  K 4.4   < > 4.2 4.1 4.3  CL 106   < > 105 106 105  CO2 30   < > 23* 25* 26*  GLUCOSE 98  --   --   --   --   BUN 25*   < > 34* 26* 25*  CREATININE 0.81   < > 0.7 0.8 0.8  CALCIUM 10.0   < > 9.2 8.9 8.9   < > = values in this interval not displayed.   Liver Function Tests: Recent Labs    10/04/23 1612 01/26/24 0000 04/03/24 0000 05/07/24 0000  AST 15 20 21 18   ALT 11 12 14 14   ALKPHOS 53 63 63 62  BILITOT 0.6  --   --   --   PROT 7.3  --   --   --   ALBUMIN 4.3 4.1 4.0 3.5   No  results for input(s): LIPASE, AMYLASE in the last 8760 hours. No results for input(s): AMMONIA in the last 8760 hours. CBC: Recent Labs    10/04/23 1612 01/26/24 0000  WBC 7.9 6.4  NEUTROABS 4.6   --   HGB 14.9 14.1  HCT 44.5 43  MCV 91.7  --   PLT 262.0 262   Cardiac Enzymes: No results for input(s): CKTOTAL, CKMB, CKMBINDEX, TROPONINI in the last 8760 hours. BNP: Invalid input(s): POCBNP Lab Results  Component Value Date   HGBA1C 5.9 10/04/2023   Lab Results  Component Value Date   TSH 3.24 04/03/2024   Lab Results  Component Value Date   VITAMINB12 274 10/04/2023   No results found for: FOLATE No results found for: IRON, TIBC, FERRITIN  Imaging and Procedures obtained prior to SNF admission: CT Cervical Spine Wo Contrast Result Date: 06/09/2024 EXAM: CT CERVICAL SPINE WITHOUT CONTRAST 06/09/2024 03:58:02 PM TECHNIQUE: CT of the cervical spine was performed without the administration of intravenous contrast. Multiplanar reformatted images are provided for review. Automated exposure control, iterative reconstruction, and/or weight based adjustment of the mA/kV was utilized to reduce the radiation dose to as low as reasonably achievable. COMPARISON: None available. CLINICAL HISTORY: Neck trauma (Age >= 65y) FINDINGS: CERVICAL SPINE: BONES AND ALIGNMENT: Straightening of the normal cervical lordosis. No evidence of traumatic malalignment. DEGENERATIVE CHANGES: Endplate osteophytes at multiple levels, facet arthrosis and uncovertebral hypertrophy at multiple levels. Foraminal stenosis throughout the cervical spine, most pronounced at C5-C6. No significant osseous foraminal stenosis. SOFT TISSUES: No prevertebral soft tissue swelling. IMPRESSION: 1. No acute abnormality of the cervical spine related to the reported neck trauma. 2. Degenerative changes as above. Electronically signed by: Donnice Mania MD 06/09/2024 04:19 PM EST RP Workstation: [id number]   CT Head Wo Contrast Result Date: 06/09/2024 EXAM: CT HEAD WITHOUT CONTRAST 06/09/2024 03:58:02 PM TECHNIQUE: CT of the head was performed without the administration of intravenous contrast. Automated exposure  control, iterative reconstruction, and/or weight based adjustment of the mA/kV was utilized to reduce the radiation dose to as low as reasonably achievable. COMPARISON: MRI brain 06/01/2022. CLINICAL HISTORY: Head trauma, minor (Age >= 65y). FINDINGS: BRAIN AND VENTRICLES: No acute hemorrhage. No evidence of acute infarct. No hydrocephalus. No extra-axial collection. No mass effect or midline shift. Similar mild parenchymal volume loss, cavum septum pellucidum, advertebral. 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 ORBITS: Bilateral lens replacement. SINUSES: Trace fluid in the left mastoid air cells. SOFT TISSUES AND SKULL: No acute soft tissue abnormality. No skull fracture. Atherosclerosis at the skull base. IMPRESSION: 1. No acute intracranial abnormality related to the head trauma. 2. Mild parenchymal volume loss. Electronically signed by: Donnice Mania MD 06/09/2024 04:12 PM EST RP Workstation: [id number]   DG Elbow Complete Right Result Date: 06/09/2024 EXAM: 3 VIEW(S) XRAY OF THE ELBOW COMPARISON: None available. CLINICAL HISTORY: fall, elbow pain FINDINGS: BONES AND JOINTS: No acute fracture. No focal osseous lesion. No joint dislocation. No joint effusion. SOFT TISSUES: Posterior elbow soft tissue swelling consistent with hematoma. IMPRESSION: 1. Posterior elbow soft tissue swelling consistent with hematoma. 2. No acute osseous abnormality. Electronically signed by: Norman Gatlin MD 06/09/2024 02:42 PM EST RP Workstation: [id number]    Assessment/Plan 1. Bilateral edema of lower extremity (Primary) Better on Diuretics Torsemide   BNP was normal Echo Normal Dopplers Negative She has lost weight but no new recent weights as she is refusing   2. Moderate late onset Alzheimer's dementia with other behavioral disturbance (HCC) On Exelon   Patch Risperdal and Depakote  Seems  to be doing well 3. Low hemoglobin Occult Pending Repeat CBC pending  4. Primary  insomnia Doxepin  5. Recurrent major depressive disorder, in partial remission Zoloft  6 Chronic Right sided Abdominal Pain  No More work up On Bentyl   CT in 5/25 was negative    Family/ staff Communication:   Labs/tests ordered:

## 2024-06-26 DIAGNOSIS — R2689 Other abnormalities of gait and mobility: Secondary | ICD-10-CM | POA: Diagnosis not present

## 2024-06-26 DIAGNOSIS — R413 Other amnesia: Secondary | ICD-10-CM | POA: Diagnosis not present

## 2024-06-26 DIAGNOSIS — R2681 Unsteadiness on feet: Secondary | ICD-10-CM | POA: Diagnosis not present

## 2024-06-26 DIAGNOSIS — D751 Secondary polycythemia: Secondary | ICD-10-CM | POA: Diagnosis not present

## 2024-06-26 DIAGNOSIS — K5730 Diverticulosis of large intestine without perforation or abscess without bleeding: Secondary | ICD-10-CM | POA: Diagnosis not present

## 2024-06-26 DIAGNOSIS — G459 Transient cerebral ischemic attack, unspecified: Secondary | ICD-10-CM | POA: Diagnosis not present

## 2024-06-26 DIAGNOSIS — Z9181 History of falling: Secondary | ICD-10-CM | POA: Diagnosis not present

## 2024-06-26 DIAGNOSIS — R41841 Cognitive communication deficit: Secondary | ICD-10-CM | POA: Diagnosis not present

## 2024-06-26 LAB — CBC AND DIFFERENTIAL
HCT: 32 — AB (ref 36–46)
Hemoglobin: 10.9 — AB (ref 12.0–16.0)
Platelets: 255 K/uL (ref 150–400)
WBC: 7.8

## 2024-06-26 LAB — CBC: RBC: 3.61 — AB (ref 3.87–5.11)

## 2024-06-27 DIAGNOSIS — R2681 Unsteadiness on feet: Secondary | ICD-10-CM | POA: Diagnosis not present

## 2024-06-27 DIAGNOSIS — R41841 Cognitive communication deficit: Secondary | ICD-10-CM | POA: Diagnosis not present

## 2024-06-27 DIAGNOSIS — Z9181 History of falling: Secondary | ICD-10-CM | POA: Diagnosis not present

## 2024-06-27 DIAGNOSIS — R2689 Other abnormalities of gait and mobility: Secondary | ICD-10-CM | POA: Diagnosis not present

## 2024-06-27 DIAGNOSIS — R413 Other amnesia: Secondary | ICD-10-CM | POA: Diagnosis not present

## 2024-06-29 DIAGNOSIS — R41841 Cognitive communication deficit: Secondary | ICD-10-CM | POA: Diagnosis not present

## 2024-06-29 DIAGNOSIS — R413 Other amnesia: Secondary | ICD-10-CM | POA: Diagnosis not present

## 2024-06-29 DIAGNOSIS — R2689 Other abnormalities of gait and mobility: Secondary | ICD-10-CM | POA: Diagnosis not present

## 2024-06-29 DIAGNOSIS — Z9181 History of falling: Secondary | ICD-10-CM | POA: Diagnosis not present

## 2024-06-29 DIAGNOSIS — R2681 Unsteadiness on feet: Secondary | ICD-10-CM | POA: Diagnosis not present

## 2024-06-30 DIAGNOSIS — R41841 Cognitive communication deficit: Secondary | ICD-10-CM | POA: Diagnosis not present

## 2024-06-30 DIAGNOSIS — R413 Other amnesia: Secondary | ICD-10-CM | POA: Diagnosis not present

## 2024-06-30 DIAGNOSIS — R2689 Other abnormalities of gait and mobility: Secondary | ICD-10-CM | POA: Diagnosis not present

## 2024-06-30 DIAGNOSIS — Z9181 History of falling: Secondary | ICD-10-CM | POA: Diagnosis not present

## 2024-06-30 DIAGNOSIS — R2681 Unsteadiness on feet: Secondary | ICD-10-CM | POA: Diagnosis not present

## 2024-07-03 ENCOUNTER — Encounter: Admitting: Internal Medicine

## 2024-07-05 ENCOUNTER — Ambulatory Visit: Admitting: Physician Assistant

## 2024-07-06 DIAGNOSIS — Z23 Encounter for immunization: Secondary | ICD-10-CM | POA: Diagnosis not present

## 2024-07-06 DIAGNOSIS — F332 Major depressive disorder, recurrent severe without psychotic features: Secondary | ICD-10-CM | POA: Diagnosis not present

## 2024-07-14 ENCOUNTER — Encounter: Payer: Self-pay | Admitting: Internal Medicine

## 2024-07-17 ENCOUNTER — Encounter: Payer: Self-pay | Admitting: Orthopedic Surgery

## 2024-07-17 ENCOUNTER — Non-Acute Institutional Stay: Payer: Self-pay | Admitting: Orthopedic Surgery

## 2024-07-17 DIAGNOSIS — L02412 Cutaneous abscess of left axilla: Secondary | ICD-10-CM | POA: Diagnosis not present

## 2024-07-17 DIAGNOSIS — F03918 Unspecified dementia, unspecified severity, with other behavioral disturbance: Secondary | ICD-10-CM | POA: Diagnosis not present

## 2024-07-17 MED ORDER — LORAZEPAM 0.5 MG PO TABS: 0.5000 mg | ORAL_TABLET | Freq: Three times a day (TID) | ORAL | Status: DC | PRN

## 2024-07-17 NOTE — Progress Notes (Signed)
 Location:  [facility] Room Number: 305/A Place of Service:  SNF [phone]) Provider:  Greig FORBES Cluster, NP   Charlanne Fredia CROME, MD  Patient Care Team: Charlanne Fredia CROME, MD as PCP - General (Internal Medicine) Rosalie Kitchens, MD as Consulting Physician (Gastroenterology) Marget Lenis, MD as Consulting Physician (Obstetrics and Gynecology) Harvey Seltzer, MD as Consulting Physician (Sports Medicine) Yvone Rush, MD as Consulting Physician (Orthopedic Surgery) Gaston Hamilton, MD as Consulting Physician (Urology) Elisabeth Valli BIRCH, MD as Consulting Physician (Urology) Wertman, Sara E, PA-C (Neurology) Bond, Reyes Mcardle, MD as Referring Physician (Ophthalmology)  Extended Emergency Contact Information Primary Emergency Contact: [facility] [place] [facility] Phone: [phone] Mobile Phone: [phone] Relation: Sister Secondary Emergency Contact: Diehl,Carol Address: [address]          [place], [place] [address] [place] Home Phone: [phone] Mobile Phone: [phone] Relation: Sister  Code Status:  Full code Goals of care: Advanced Directive information    02/14/2024    3:51 PM  Advanced Directives  Does Patient Have a Medical Advance Directive? Yes  Type of Advance Directive Living will;Healthcare Power of Attorney  Does patient want to make changes to medical advance directive? No - Patient declined  Copy of Healthcare Power of Attorney in Chart? Yes - validated most recent copy scanned in chart (See row information)     Chief Complaint  Patient presents with   Acute Visit    abscess    HPI:  Pt is a 78 y.o. female seen today for acute visit due to left axilla abscess.  She currently resides on the memory care unit at [facility]. PMH: CAD, TIA, HLD, diverticulosis, AD, arthritis, anxiety, constipation and urinary urgency/incontinence.   Poor historian due to dementia. Nursing reports quarter sized  abscess/boil under left axilla. Began as a ingrown hair a few weeks ago. Last night she reported yellow tinged drainage. She also reported increased pain. Afebrile. Vitals stable.   Adjusting to memory care. Supportive sister. Remains on Depakote , risperdal, rivastigmine , zoloft  and ativan  prn for behaviors.    Past Medical History:  Diagnosis Date   Abdominal cramps 08/03/2022   Chronic abdominal cramps - periodic. Worse. Treat constipation.  (Pt saw Dr Rosalie, had an MRI).   Allergic rhinitis 08/03/2022   Chronic runny nose - Atrovent  did not work  Start Loratidine 10 mg/d   Arthritis    Atrophic vaginitis 04/29/2021   9/22  Dr Marget, dr Elisabeth  On estrocream vag 3/wk PV   Bilateral leg weakness 04/06/2013   Present since 2011-2012. She must push up from a chair   CAD (coronary artery disease) 04/29/2021   2021 CT coronary calcium score of 302.   Cont w/Simvastatin  - d/c, ASA  Cont on Pravastatin    Cataract    Both eyes have had lens replacement.   Constipation 08/03/2022   Chronic abdominal cramps - periodic. Worse.  (Pt saw Dr Rosalie, had an MRI).   Diverticulosis of colon 10/02/2010   Dyslipidemia 02/20/2008   Simvastatin      NMR Lipoprofile 2005: LDL particle # 1776/ small dense 1106 on Lipitor 20 mg daily.   MGF MI in 26s; father MI @ 47.  No FH CVA  NMR 05/2011:LDL 78 (1169/542), HDL 41, TG 135. LDL goal < 100, ideally < 70.     [facility] panel: Hyperabsorber of  dietary cholesterol; Lp(a) 112; CRP 2.7     12/19 cardiac CT scan for calcium scoring offered  Cont on Pravastatin       Dysrhythmia    palpitations recently   Elevated blood pressure reading without diagnosis of hypertension 04/10/2010   Generalized anxiety disorder 07/29/2021   12/22  Discussed. Pt declined meds, declined ref to psychology, psychiatry... Try Valerian root for anxiety   Glaucoma 03/17/2011   Groin pain 03/26/2019   8/20 R - ?MSK vs other  R/o hernia  Gen surg ref vs CT discussed  06/2019 CT ok - Dr  Rosalie  MSK strain   Heart murmur    History of colonic polyps 03/25/2009       Colonoscopy 2009: Adenomatous polyps, Dr Rosalie.   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   Pigmentary glaucoma of both eyes, mild stage 04/09/2013   Plantar fasciitis, left 06/18/2009   Polycythemia, secondary 04/12/2016   Mild 2016   Pre-diabetes    per patient   Pseudophakia 06/23/2012   TIA (transient ischemic attack) 07/08/2019   12/20 probable-global aphasia, confusion  global aphasia, confusion - no relapse  Doppler (-) B  Card ECHO ordered   Urinary urgency 10/02/2010   Chronic  Dr Marget, Dr McDiarmid d/c. Seeing Dr Elisabeth  ?OAB  Trial of prn Ditropan  2022  Ditropan  po  Try estrocream vag 3/wk PV  On Macrobid qd  10/22 Memory loss is worse.  Likely aggravated by daily use of Ditropan .  She can switch to as needed use.  12/22 on Estrocream and Myrbetriq   Discussed anxiety issues. Pt declined meds, declined ref to psychology, psychiatry...  Try Valerian root for anxie   Varicose veins of lower extremities 07/13/2010   Past Surgical History:  Procedure Laterality Date   ABDOMINAL HYSTERECTOMY     ANTERIOR AND POSTERIOR REPAIR WITH SACROSPINOUS FIXATION N/A 06/05/2015   Procedure: ANTERIOR AND POSTERIOR REPAIR WITH SACROSPINOUS LIGAMENT SUSPENSION;  Surgeon: Alm Marget, MD;  Location: [facility];  Service: Gynecology;  Laterality: N/A;   CATARACT EXTRACTION, BILATERAL     lens implant   CHOLECYSTECTOMY  1991   CHOLECYSTECTOMY, LAPAROSCOPIC     Dr Merrilyn    COLONOSCOPY  2015   neg; due 2020   colonoscopy with polypectomy     Dr Rosalie; X 3   EYE SURGERY     trabeculectomy ;Dr Marci.Cataract removal OD; Dr Eliza   TOTAL HIP ARTHROPLASTY Right 09/22/2018   Procedure: TOTAL HIP ARTHROPLASTY ANTERIOR APPROACH;  Surgeon: Yvone Rush, MD;  Location: [facility];  Service: Orthopedics;  Laterality: Right;    Allergies[1]  Outpatient Encounter Medications as of 07/17/2024  Medication Sig   acetaminophen  (TYLENOL ) 325 MG tablet Take 650 mg by mouth every morning.   acetaminophen  (TYLENOL ) 500 MG tablet Take 1,000 mg by mouth every 12 (twelve) hours as needed.   aspirin  81 MG EC tablet Take 81 mg by mouth daily. Swallow whole.   brimonidine-timolol  (COMBIGAN) 0.2-0.5 % ophthalmic solution Place 1 drop into the left eye 2 (two)  times daily.   cetirizine (ZYRTEC) 10 MG tablet Take 10 mg by mouth daily.   dextromethorphan-guaiFENesin (ROBITUSSIN-DM) 10-100 MG/5ML liquid Take 10 mLs by mouth every 6 (six) hours as needed for cough.   diclofenac Sodium (VOLTAREN) 1 % GEL Apply topically as needed.   dicyclomine  (BENTYL ) 10 MG capsule Take 1 capsule (10 mg total) by mouth every morning.   divalproex  (DEPAKOTE  ER) 500 MG 24 hr tablet Take 500 mg by mouth every evening. Give 500 mg by mouth in the evening for aggressive behaviors   divalproex  (DEPAKOTE ) 250 MG DR tablet Take 250 mg by mouth daily.  Give 250 mg by mouth one time a day for aggressive behaviors   doxepin (SINEQUAN) 10 MG capsule Take 25 mg by mouth at bedtime.  Give 1 capsule by mouth at bedtime for insomnia   fluticasone (FLONASE) 50 MCG/ACT nasal spray Place 1 spray into both nostrils every 12 (twelve) hours as needed for allergies or rhinitis.   ibuprofen  (ADVIL ) 200 MG tablet Take 200 mg by mouth every 6 (six) hours as needed.   latanoprost (XALATAN) 0.005 % ophthalmic solution Place 1 drop into the left eye at bedtime.   LORazepam  (ATIVAN ) 0.5 MG tablet Take 1 tablet (0.5 mg total) by mouth 3  (three) times daily as needed for anxiety. Please provide transdermal ativan  gel. Rub Ativan  0.5 mg in gel formulation TID as needed for agitation (Patient not taking: Reported on 06/12/2024)   LORazepam  (ATIVAN ) 1 MG tablet Take 1 mg by mouth 2 (two) times daily as needed for anxiety. Give 1 mg orally as needed for BID PRN for agitation   pantoprazole  (PROTONIX ) 40 MG tablet Take 1 tablet (40 mg total) by mouth daily.   Polyethyl Glycol-Propyl Glycol 0.4-0.3 % SOLN Place 1-2 drops into both eyes daily as needed (for dry eyes).   polyethylene glycol (MIRALAX  / GLYCOLAX ) 17 g packet Take 17 g by mouth as needed.   potassium chloride  SA (KLOR-CON  M) 20 MEQ tablet Take 1 tablet (20 mEq total) by mouth daily.   pravastatin  (PRAVACHOL ) 10 MG tablet TAKE 1 TABLET BY MOUTH DAILY   risperiDONE (RISPERDAL) 0.25 MG tablet Take 0.5 mg by mouth daily.  Give 0.5 mg by mouth two times a day related to DEMENTIA IN OTHER DISEASES CLASSIFIED ELSEWHERE, MODERATE, WITH AGITATION (F02.B11)   risperiDONE (RISPERDAL) 1 MG tablet Take 1 mg by mouth at bedtime.   rivastigmine  (EXELON ) 3 MG capsule Take 4.5 mg by mouth 2 (two) times daily.   senna (SENOKOT) 8.6 MG TABS tablet Take 2 tablets by mouth as needed for mild constipation.   sertraline  (ZOLOFT ) 50 MG tablet Take 75 mg by mouth daily.   Torsemide  40 MG TABS Take 40 mg by mouth daily.   traMADol  (ULTRAM ) 50 MG tablet Take by mouth every 8 (eight) hours as needed.   No facility-administered encounter medications on file as of 07/17/2024.    Review of Systems  Unable to perform ROS: Dementia    Immunization History  Administered Date(s) Administered    sv, Bivalent, Protein Subunit Rsvpref,pf [place]) 08/11/2022   Fluad  Quad(high Dose 65+) 03/26/2019, 04/23/2020, 04/29/2021, 05/03/2022, 05/10/2023   Fluad  Trivalent(High Dose 65+) 05/05/2023   INFLUENZA, HIGH DOSE SEASONAL PF 04/11/2014, 04/12/2016, 04/13/2017   Influenza Whole 05/02/2012, 05/02/2013    Influenza-Unspecified 04/27/2015, 05/12/2018   Moderna Covid-19 Vaccine  Bivalent Booster 11yrs & up 06/07/2024   Moderna SARS-COV2 Booster Vaccination 06/17/2020, 12/18/2020   Moderna Sars-Covid-2 Vaccination 08/14/2019, 09/12/2019, 05/15/2021  Pneumococcal Conjugate-13 04/11/2015   Pneumococcal Polysaccharide-23 03/17/2011   Td 02/20/2008   Tdap 06/26/2019   Unspecified SARS-COV-2 Vaccination 08/14/2019, 09/12/2019, 06/17/2020, 12/18/2020, 06/02/2022, 11/25/2022, 05/05/2023   Zoster Recombinant(Shingrix) 09/08/2017, 11/24/2017   Zoster, Live 08/02/2010   Pertinent  Health Maintenance Due  Topic Date Due   Influenza Vaccine  Completed   Bone Density Scan  Completed   Mammogram  Discontinued   Colonoscopy  Discontinued      12/28/2023    3:44 PM 02/27/2024    3:05 PM 04/03/2024    6:54 PM 04/24/2024    2:52 PM 06/12/2024    3:15 PM  Fall Risk  Falls in the past year? 0 0 0 0 1  Was there an injury with Fall? 0  0  0  0  1   Fall Risk Category Calculator 0 0 0 0 2  Patient at Risk for Falls Due to   History of fall(s) History of fall(s) History of fall(s);Impaired balance/gait  Fall risk Follow up Falls evaluation completed Falls evaluation completed Falls evaluation completed Falls evaluation completed Falls evaluation completed     Data saved with a previous flowsheet row definition   Functional Status Survey:    Vitals:   07/17/24 1542  BP: 128/74  Pulse: 70  Resp: (!) 22  Temp: (!) 97.3 F (36.3 C)  SpO2: 98%  Weight: 226 lb (102.5 kg)  Height: 6' 2 (1.88 m)   Body mass index is 29.02 kg/m. Physical Exam Vitals reviewed.  Constitutional:      General: She is not in acute distress. HENT:     Head: Normocephalic.  Eyes:     General:        Right eye: No discharge.        Left eye: No discharge.  Cardiovascular:     Rate and Rhythm: Normal rate and regular rhythm.     Pulses: Normal pulses.     Heart sounds: Normal heart sounds.  Pulmonary:     Effort:  Pulmonary effort is normal. No respiratory distress.     Breath sounds: Normal breath sounds. No wheezing or rales.  Abdominal:     General: Bowel sounds are normal. There is no distension.     Palpations: Abdomen is soft.     Tenderness: There is no abdominal tenderness.  Musculoskeletal:     Cervical back: Neck supple.     Right lower leg: Edema present.     Left lower leg: Edema present.     Comments: 1-2+ pitting  Skin:    General: Skin is warm.     Capillary Refill: Capillary refill takes less than 2 seconds.     Comments: Quarter sized, firm, raised abscess/boil to left axilla, tender to touch, scant yellow tinged drainage present, surrounding skin intact  Neurological:     General: No focal deficit present.     Mental Status: She is alert. Mental status is at baseline.  Psychiatric:        Mood and Affect: Mood normal.     Labs reviewed: Recent Labs    10/04/23 1612 01/26/24 0000 04/27/24 0000 05/07/24 0000 05/09/24 0000  NA 142   < > 141 143 141  K 4.4   < > 4.2 4.1 4.3  CL 106   < > 105 106 105  CO2 30   < > 23* 25* 26*  GLUCOSE 98  --   --   --   --   BUN 25*   < >  34* 26* 25*  CREATININE 0.81   < > 0.7 0.8 0.8  CALCIUM 10.0   < > 9.2 8.9 8.9   < > = values in this interval not displayed.   Recent Labs    10/04/23 1612 01/26/24 0000 04/03/24 0000 05/07/24 0000  AST 15 20 21 18   ALT 11 12 14 14   ALKPHOS 53 63 63 62  BILITOT 0.6  --   --   --   PROT 7.3  --   --   --   ALBUMIN 4.3 4.1 4.0 3.5   Recent Labs    10/04/23 1612 01/26/24 0000  WBC 7.9 6.4  NEUTROABS 4.6  --   HGB 14.9 14.1  HCT 44.5 43  MCV 91.7  --   PLT 262.0 262   Lab Results  Component Value Date   TSH 3.24 04/03/2024   Lab Results  Component Value Date   HGBA1C 5.9 10/04/2023   Lab Results  Component Value Date   CHOL 169 01/28/2022   HDL 54.40 01/28/2022   LDLCALC 89 01/28/2022   LDLDIRECT 85.6 02/20/2008   TRIG 127.0 01/28/2022   CHOLHDL 3 01/28/2022     Significant Diagnostic Results in last 30 days:  No results found.  Assessment/Plan 1. Abscess of left axilla (Primary) - onset unknown - possibly started as ingrown hair - quarter sized, firm, raised, tender, scan yellow tinges drainage - recommend starting doxycycline  x 14 days - heat applications TID x 7 days  - scheduled to see dermatology tomorrow  2. Dementia with behavioral disturbance (HCC) - see above - concerns for behaviors  - advised nursing staff to give ativan  gel - adjusting to memory care - ambulates on own - weight stable  - cont Depakote , risperdal, rivastigmine , zoloft  and ativan  prn    Family/ staff Communication: plan discussed with patient and nurse  Labs/tests ordered:  none      [1]  Allergies Allergen Reactions   Donepezil      diarrhea   Namenda  [Memantine ]     headache   Latex Rash    Red rash around red bandaid

## 2024-07-30 ENCOUNTER — Other Ambulatory Visit: Payer: Self-pay

## 2024-07-30 NOTE — Telephone Encounter (Signed)
 Skilled resident, provider will have to verify pharmacy, and review rx orders

## 2024-07-31 ENCOUNTER — Other Ambulatory Visit: Payer: Self-pay | Admitting: Adult Health

## 2024-07-31 MED ORDER — LORAZEPAM 1 MG PO TABS: ORAL_TABLET | ORAL | 0 refills | Status: DC

## 2024-07-31 MED ORDER — LORAZEPAM 0.5 MG PO TABS: 0.5000 mg | ORAL_TABLET | Freq: Three times a day (TID) | ORAL | 3 refills | Status: DC | PRN

## 2024-08-03 ENCOUNTER — Telehealth: Payer: Self-pay | Admitting: Adult Health

## 2024-08-03 MED ORDER — TRAMADOL HCL 50 MG PO TABS: 50.0000 mg | ORAL_TABLET | Freq: Every day | ORAL | 0 refills | Status: DC

## 2024-08-03 NOTE — Telephone Encounter (Signed)
 Refill request for Ultram  submitted

## 2024-08-10 ENCOUNTER — Encounter: Payer: Self-pay | Admitting: Adult Health

## 2024-08-10 ENCOUNTER — Non-Acute Institutional Stay (SKILLED_NURSING_FACILITY): Payer: Self-pay | Admitting: Adult Health

## 2024-08-10 DIAGNOSIS — L03119 Cellulitis of unspecified part of limb: Secondary | ICD-10-CM

## 2024-08-10 DIAGNOSIS — F03918 Unspecified dementia, unspecified severity, with other behavioral disturbance: Secondary | ICD-10-CM | POA: Diagnosis not present

## 2024-08-10 DIAGNOSIS — I872 Venous insufficiency (chronic) (peripheral): Secondary | ICD-10-CM | POA: Diagnosis not present

## 2024-08-10 MED ORDER — LEVOFLOXACIN 500 MG PO TABS: 500.0000 mg | ORAL_TABLET | Freq: Every day | ORAL | 0 refills | Status: AC

## 2024-08-10 MED ORDER — RISPERIDONE 1 MG PO TABS: 1.0000 mg | ORAL_TABLET | ORAL | Status: AC

## 2024-08-10 MED ORDER — SACCHAROMYCES BOULARDII 250 MG PO CAPS: 250.0000 mg | ORAL_CAPSULE | Freq: Two times a day (BID) | ORAL | 0 refills | Status: AC

## 2024-08-10 MED ORDER — LORAZEPAM 1 MG PO TABS: ORAL_TABLET | ORAL | Status: DC

## 2024-08-10 NOTE — Progress Notes (Signed)
 " Location:  Medical Illustrator of Service:  SNF (31) Provider:   Bari America, ANP [facility] [phone]   Charlanne Fredia CROME, MD  Patient Care Team: Charlanne Fredia CROME, MD as PCP - General (Internal Medicine) Rosalie Kitchens, MD as Consulting Physician (Gastroenterology) Marget Lenis, MD as Consulting Physician (Obstetrics and Gynecology) Harvey Seltzer, MD as Consulting Physician (Sports Medicine) Yvone Rush, MD as Consulting Physician (Orthopedic Surgery) Gaston Hamilton, MD as Consulting Physician (Urology) Elisabeth Valli BIRCH, MD as Consulting Physician (Urology) Wertman, Sara E, PA-C (Neurology) Bond, Reyes Mcardle, MD as Referring Physician (Ophthalmology)  Extended Emergency Contact Information Primary Emergency Contact: Ozell Leita [place] Home Phone: [phone] Mobile Phone: [phone] Relation: Sister Secondary Emergency Contact: Diehl,Carol Address: [address]          [place], [place] [address] [place] Home Phone: [phone] Mobile Phone: [phone] Relation: Sister  Code Status:  DNR Goals of care: Advanced Directive information    02/14/2024    3:51 PM  Advanced Directives  Does Patient Have a Medical Advance Directive? Yes  Type of Advance Directive Living will;Healthcare Power of Attorney  Does patient want to make changes to medical advance directive? No - Patient declined  Copy of Healthcare Power of Attorney in Chart? Yes - validated most recent copy scanned in chart (See row information)     Chief Complaint  Patient presents with   Acute Visit    Leg erythema    HPI:  Pt is a 79 y.o. female seen today for an acute visit for leg erythema and swelling.   She resides in skilled memory care with progressive dementia. She experiences paranoia and agitation and is followed by psych on a multidrug regimen.    Nurse reports leg erythema and increased swelling.  No sob Weight  down by 10 lbs per records (quite variable readings) Currently on torsemide  daily  Recently treated for an abscess to the left axilla with 14 days of doxycycline  During the time of treatment the nurse reports her legs were not erythematous and had improved but remained swollen. No they are significant worse.   2 D echo Nov 2025 Normal LV function EF 60-65% Mild hypertrophy of the left ventricle  Neg venous dopplers  Normal BNP  Request to discontinue nasal spray and change exelon  from oral to patch form due to refusal.  Past Medical History:  Diagnosis Date   Abdominal cramps 08/03/2022   Chronic abdominal cramps - periodic. Worse. Treat constipation.  (Pt saw Dr Rosalie, had an MRI).   Allergic rhinitis 08/03/2022   Chronic runny nose - Atrovent  did not work  Start Loratidine 10 mg/d   Arthritis    Atrophic vaginitis 04/29/2021   9/22  Dr Marget, dr Elisabeth  On estrocream vag 3/wk PV   Bilateral leg weakness 04/06/2013   Present since 2011-2012. She must push up from a chair   CAD (coronary artery disease) 04/29/2021   2021 CT coronary calcium score of 302.   Cont w/Simvastatin  - d/c, ASA  Cont on Pravastatin    Cataract    Both eyes have had lens replacement.   Constipation 08/03/2022   Chronic abdominal cramps - periodic. Worse.  (Pt saw Dr Rosalie, had an MRI).   Diverticulosis of colon 10/02/2010   Dyslipidemia 02/20/2008   Simvastatin      NMR Lipoprofile 2005: LDL particle # 1776/ small dense 1106 on Lipitor 20 mg daily.   MGF MI in 64s;  father MI @ 63.  No FH CVA  NMR 05/2011:LDL 78 (1169/542), HDL 41, TG 135. LDL goal < 100, ideally < 70.     [facility] panel: Hyperabsorber of  dietary cholesterol; Lp(a) 112; CRP 2.7     12/19 cardiac CT scan for calcium scoring offered     Cont on Pravastatin       Dysrhythmia    palpitations recently   Elevated blood pressure reading without diagnosis of hypertension 04/10/2010   Generalized anxiety disorder 07/29/2021   12/22  Discussed. Pt  declined meds, declined ref to psychology, psychiatry... Try Valerian root for anxiety   Glaucoma 03/17/2011   Groin pain 03/26/2019   8/20 R - ?MSK vs other  R/o hernia  Gen surg ref vs CT discussed  06/2019 CT ok - Dr Rosalie  MSK strain   Heart murmur    History of colonic polyps 03/25/2009       Colonoscopy 2009: Adenomatous polyps, Dr Rosalie.   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   Pigmentary glaucoma of both eyes, mild stage 04/09/2013   Plantar fasciitis, left 06/18/2009   Polycythemia, secondary 04/12/2016   Mild 2016   Pre-diabetes    per patient   Pseudophakia 06/23/2012   TIA (transient ischemic attack) 07/08/2019   12/20 probable-global aphasia, confusion  global aphasia, confusion - no relapse  Doppler (-) B  Card ECHO ordered   Urinary urgency 10/02/2010   Chronic  Dr Marget, Dr McDiarmid d/c. Seeing Dr Elisabeth  ?OAB  Trial of prn Ditropan  2022  Ditropan  po  Try estrocream vag 3/wk PV  On Macrobid qd  10/22 Memory loss is worse.  Likely aggravated by daily use of Ditropan .  She can switch to as needed use.  12/22 on Estrocream and Myrbetriq   Discussed anxiety issues. Pt declined meds, declined ref to psychology, psychiatry...  Try Valerian root for anxie   Varicose veins of lower extremities 07/13/2010   Past Surgical History:  Procedure Laterality Date   ABDOMINAL HYSTERECTOMY     ANTERIOR AND POSTERIOR REPAIR WITH SACROSPINOUS FIXATION N/A 06/05/2015   Procedure: ANTERIOR AND POSTERIOR REPAIR WITH SACROSPINOUS LIGAMENT  SUSPENSION;  Surgeon: Alm Marget, MD;  Location: [facility];  Service: Gynecology;  Laterality: N/A;   CATARACT EXTRACTION, BILATERAL     lens implant   CHOLECYSTECTOMY  1991   CHOLECYSTECTOMY, LAPAROSCOPIC     Dr Merrilyn   COLONOSCOPY  2015   neg; due 2020   colonoscopy with polypectomy     Dr Rosalie; X 3   EYE SURGERY     trabeculectomy ;Dr Marci.Cataract removal OD; Dr Eliza   TOTAL HIP ARTHROPLASTY Right 09/22/2018   Procedure: TOTAL HIP ARTHROPLASTY ANTERIOR APPROACH;  Surgeon: Yvone Rush, MD;  Location: [facility];  Service: Orthopedics;  Laterality: Right;    Allergies[1]  Outpatient Encounter Medications as of 08/10/2024  Medication Sig   levofloxacin  (LEVAQUIN ) 500 MG tablet Take  1 tablet (500 mg total) by mouth daily for 10 days.   [START ON 08/13/2024] risperiDONE  (RISPERDAL ) 1 MG tablet Take 1 tablet (1 mg total) by mouth 2 (two) times a week. On Tuesday and  Friday   saccharomyces boulardii (FLORASTOR) 250 MG capsule Take 1 capsule (250 mg total) by mouth 2 (two) times daily for 10 days.   acetaminophen  (TYLENOL ) 325 MG tablet Take 650 mg by mouth every morning.   acetaminophen  (TYLENOL ) 500 MG tablet Take 1,000 mg by mouth every 12 (twelve) hours as needed.   aspirin  81 MG EC tablet Take 81 mg by mouth daily. Swallow whole.   brimonidine-timolol  (COMBIGAN) 0.2-0.5 % ophthalmic solution Place 1 drop into the left eye 2 (two) times daily.   cetirizine (ZYRTEC) 10 MG tablet Take 10 mg by mouth daily.   dextromethorphan-guaiFENesin (ROBITUSSIN-DM) 10-100 MG/5ML liquid Take 10 mLs by mouth every 6 (six) hours as needed for cough.   diclofenac Sodium (VOLTAREN) 1 % GEL Apply topically as needed.   dicyclomine  (BENTYL ) 10 MG capsule Take 1 capsule (10 mg total) by mouth every morning.   divalproex  (DEPAKOTE ) 250 MG DR tablet Take 500 mg by mouth 2 (two) times daily.   doxepin (SINEQUAN) 10 MG capsule Take 25 mg by mouth at bedtime.  Give 1 capsule by mouth at bedtime for insomnia    ibuprofen  (ADVIL ) 200 MG tablet Take 200 mg by mouth every 6 (six) hours as needed.   latanoprost (XALATAN) 0.005 % ophthalmic solution Place 1 drop into the left eye at bedtime.   LORazepam  (ATIVAN ) 0.5 MG tablet Take 1 tablet (0.5 mg total) by mouth 3 (three) times daily as needed for anxiety. Please provide transdermal ativan  gel. Rub Ativan  0.5 mg in gel formulation TID as needed for agitation   LORazepam  (ATIVAN ) 1 MG tablet Apply topically to inner forearm twice weekly on shower days and q 8 prn agitation.   pantoprazole  (PROTONIX ) 40 MG tablet Take 1 tablet (40 mg total) by mouth daily.   Polyethyl Glycol-Propyl Glycol 0.4-0.3 % SOLN Place 1-2 drops into both eyes daily as needed (for dry eyes).   polyethylene glycol (MIRALAX  / GLYCOLAX ) 17 g packet Take 17 g by mouth as needed.   potassium chloride  SA (KLOR-CON  M) 20 MEQ tablet Take 1 tablet (20 mEq total) by mouth daily.   pravastatin  (PRAVACHOL ) 10 MG tablet TAKE 1 TABLET BY MOUTH DAILY   risperiDONE  (RISPERDAL ) 0.5 MG tablet Take 0.5 mg by mouth every other day. Given 0.5 mg Monday, Wed, Thursday, Saturday, Sunday   risperiDONE  (RISPERDAL ) 1 MG tablet Take 1 mg by mouth at bedtime.   rivastigmine  (EXELON ) 3 MG capsule Take 4.5 mg by mouth 2 (two) times daily.   senna (SENOKOT) 8.6 MG TABS tablet Take 2 tablets by mouth as needed for mild constipation.   sertraline  (ZOLOFT ) 50 MG tablet Take 75 mg by mouth daily.   Torsemide  40 MG TABS Take 40 mg by mouth daily.   traMADol  (ULTRAM ) 50 MG tablet Take by mouth every 8 (eight) hours as needed.   traMADol  (ULTRAM ) 50 MG tablet Take 1 tablet (50 mg total) by mouth at bedtime.   [DISCONTINUED] divalproex  (DEPAKOTE  ER) 500 MG 24 hr tablet Take 500 mg by mouth every evening. Give 500 mg by mouth in the evening for aggressive behaviors   [DISCONTINUED] fluticasone (FLONASE) 50 MCG/ACT nasal spray Place 1 spray into both nostrils every 12 (twelve) hours as needed for allergies or rhinitis.    [  DISCONTINUED] LORazepam  (ATIVAN ) 1 MG tablet Apply topically to inner forearm every 8 hours as needed for agitation   No facility-administered encounter medications on file as of 08/10/2024.    Review of Systems  Unable to perform ROS: Dementia    Immunization History  Administered Date(s) Administered    sv, Bivalent, Protein Subunit Rsvpref,pf (Abrysvo) 08/11/2022   Fluad  Quad(high Dose 65+) 03/26/2019, 04/23/2020, 04/29/2021, 05/03/2022, 05/10/2023   Fluad  Trivalent(High Dose 65+) 05/05/2023   INFLUENZA, HIGH DOSE SEASONAL PF 04/11/2014, 04/12/2016, 04/13/2017   Influenza Whole 05/02/2012, 05/02/2013   Influenza-Unspecified 04/27/2015, 05/12/2018   Moderna Covid-19 Vaccine  Bivalent Booster 43yrs & up 06/07/2024   Moderna SARS-COV2 Booster Vaccination 06/17/2020, 12/18/2020   Moderna Sars-Covid-2 Vaccination 08/14/2019, 09/12/2019, 05/15/2021   Pneumococcal Conjugate-13 04/11/2015   Pneumococcal Polysaccharide-23 03/17/2011   Td 02/20/2008   Tdap 06/26/2019   Unspecified SARS-COV-2 Vaccination 08/14/2019, 09/12/2019, 06/17/2020, 12/18/2020, 06/02/2022, 11/25/2022, 05/05/2023   Zoster Recombinant(Shingrix) 09/08/2017, 11/24/2017   Zoster, Live 08/02/2010   Pertinent  Health Maintenance Due  Topic Date Due   Influenza Vaccine  Completed   Bone Density Scan  Completed   Mammogram  Discontinued   Colonoscopy  Discontinued      02/27/2024    3:05 PM 04/03/2024    6:54 PM 04/24/2024    2:52 PM 06/12/2024    3:15 PM 07/17/2024    4:29 PM  Fall Risk  Falls in the past year? 0 0 0 1 1  Was there an injury with Fall? 0  0  0  1  1  Fall Risk Category Calculator 0 0 0 2 2  Patient at Risk for Falls Due to  History of fall(s) History of fall(s) History of fall(s);Impaired balance/gait History of fall(s);Impaired balance/gait  Fall risk Follow up Falls evaluation completed Falls evaluation completed Falls evaluation completed Falls evaluation completed Falls evaluation completed      Data saved with a previous flowsheet row definition   Functional Status Survey:    Vitals:   08/10/24 1139  BP: 134/80  Pulse: 74  Resp: 20  SpO2: 94%  Weight: 216 lb (98 kg)   Body mass index is 27.73 kg/m.  Physical Exam Vitals and nursing note reviewed.  Constitutional:      General: She is not in acute distress.    Appearance: She is not diaphoretic.  HENT:     Head: Normocephalic and atraumatic.  Neck:     Vascular: No JVD.  Cardiovascular:     Rate and Rhythm: Normal rate and regular rhythm.     Heart sounds: No murmur heard. Pulmonary:     Effort: Pulmonary effort is normal. No respiratory distress.     Breath sounds: Normal breath sounds. No wheezing.  Musculoskeletal:     Right lower leg: Edema present.     Left lower leg: Edema present.     Comments: Erythema and swelling to BLE No purulent drainage.  Slight tenderness noted.   Skin:    General: Skin is warm and dry.  Neurological:     Mental Status: She is alert. Mental status is at baseline.     Labs reviewed: Recent Labs    10/04/23 1612 01/26/24 0000 04/27/24 0000 05/07/24 0000 05/09/24 0000  NA 142   < > 141 143 141  K 4.4   < > 4.2 4.1 4.3  CL 106   < > 105 106 105  CO2 30   < > 23* 25* 26*  GLUCOSE 98  --   --   --   --  BUN 25*   < > 34* 26* 25*  CREATININE 0.81   < > 0.7 0.8 0.8  CALCIUM 10.0   < > 9.2 8.9 8.9   < > = values in this interval not displayed.   Recent Labs    10/04/23 1612 01/26/24 0000 04/03/24 0000 05/07/24 0000  AST 15 20 21 18   ALT 11 12 14 14   ALKPHOS 53 63 63 62  BILITOT 0.6  --   --   --   PROT 7.3  --   --   --   ALBUMIN 4.3 4.1 4.0 3.5   Recent Labs    10/04/23 1612 01/26/24 0000  WBC 7.9 6.4  NEUTROABS 4.6  --   HGB 14.9 14.1  HCT 44.5 43  MCV 91.7  --   PLT 262.0 262   Lab Results  Component Value Date   TSH 3.24 04/03/2024   Lab Results  Component Value Date   HGBA1C 5.9 10/04/2023   Lab Results  Component Value Date   CHOL  169 01/28/2022   HDL 54.40 01/28/2022   LDLCALC 89 01/28/2022   LDLDIRECT 85.6 02/20/2008   TRIG 127.0 01/28/2022   CHOLHDL 3 01/28/2022    Significant Diagnostic Results in last 30 days:  No results found.  Assessment/Plan  1. Cellulitis of lower extremity, unspecified laterality (Primary)  - Levofloxacin  (LEVAQUIN ) 500 MG tablet; Take 1 tablet (500 mg total) by mouth daily for 10 days.  Dispense: 10 tablet; Refill: 0 - saccharomyces boulardii (FLORASTOR) 250 MG capsule; Take 1 capsule (250 mg total) by mouth 2 (two) times daily for 10 days.  Dispense: 20 capsule; Refill: 0 -follow up appearance next week, ?chronic stasis dermatitis  2. Chronic venous insufficiency of lower extremity Refusing hose Double layer compression wraps on in the am and off in the pm toe to knee  3. Dementia with behavioral disturbance (HCC) Significant progression over the past few months on a complex drug regimen Update labs.    Family/ staff Communication: discussed with the nurse and her sisters.   Labs/tests ordered:  CBC CMP DEP level TSH Lipid       [1]  Allergies Allergen Reactions   Donepezil      diarrhea   Namenda  [Memantine ]     headache   Latex Rash    Red rash around red bandaid   "

## 2024-08-13 LAB — CBC AND DIFFERENTIAL
HCT: 31 — AB (ref 36–46)
Hemoglobin: 10.5 — AB (ref 12.0–16.0)
Platelets: 180 K/uL (ref 150–400)
WBC: 5.1

## 2024-08-13 LAB — BASIC METABOLIC PANEL WITH GFR
BUN: 15 (ref 4–21)
CO2: 29 — AB (ref 13–22)
Chloride: 105 (ref 99–108)
Creatinine: 0.9 (ref 0.5–1.1)
Glucose: 87
Potassium: 3.6 meq/L (ref 3.5–5.1)
Sodium: 145 (ref 137–147)

## 2024-08-13 LAB — TSH: TSH: 4.87 (ref 0.41–5.90)

## 2024-08-13 LAB — HEPATIC FUNCTION PANEL
ALT: 10 U/L (ref 7–35)
AST: 14 (ref 13–35)
Alkaline Phosphatase: 61 (ref 25–125)
Bilirubin, Total: 0.2

## 2024-08-13 LAB — LIPID PANEL
Cholesterol: 152 (ref 0–200)
HDL: 47 (ref 35–70)
LDL Cholesterol: 89
LDl/HDL Ratio: 3.2
Triglycerides: 78 (ref 40–160)

## 2024-08-13 LAB — COMPREHENSIVE METABOLIC PANEL WITH GFR
Albumin: 3.2 — AB (ref 3.5–5.0)
Calcium: 8.9 (ref 8.7–10.7)
Globulin: 2.3
eGFR: 6.2

## 2024-08-13 LAB — CBC: RBC: 3.47 — AB (ref 3.87–5.11)

## 2024-08-20 ENCOUNTER — Non-Acute Institutional Stay (SKILLED_NURSING_FACILITY): Payer: Self-pay | Admitting: Adult Health

## 2024-08-20 ENCOUNTER — Encounter: Payer: Self-pay | Admitting: Adult Health

## 2024-08-20 DIAGNOSIS — R600 Localized edema: Secondary | ICD-10-CM

## 2024-08-20 DIAGNOSIS — L03119 Cellulitis of unspecified part of limb: Secondary | ICD-10-CM

## 2024-08-20 DIAGNOSIS — F03918 Unspecified dementia, unspecified severity, with other behavioral disturbance: Secondary | ICD-10-CM | POA: Diagnosis not present

## 2024-08-20 DIAGNOSIS — E785 Hyperlipidemia, unspecified: Secondary | ICD-10-CM | POA: Diagnosis not present

## 2024-08-20 DIAGNOSIS — H401331 Pigmentary glaucoma, bilateral, mild stage: Secondary | ICD-10-CM | POA: Diagnosis not present

## 2024-08-20 MED ORDER — LORAZEPAM 1 MG PO TABS: ORAL_TABLET | ORAL | Status: AC

## 2024-08-20 MED ORDER — LORAZEPAM 0.5 MG PO TABS: 2.0000 mg | ORAL_TABLET | Freq: Three times a day (TID) | ORAL | Status: AC | PRN

## 2024-08-20 NOTE — Progress Notes (Signed)
 This encounter was created in error - please disregard.

## 2024-08-20 NOTE — Progress Notes (Signed)
 " Location:  [facility] Room Number: 305 P Place of Service:  SNF [phone]) Provider: Tawni America, NP   Patient Care Team: Charlanne Fredia CROME, MD as PCP - General (Internal Medicine) Rosalie Kitchens, MD as Consulting Physician (Gastroenterology) Marget Lenis, MD as Consulting Physician (Obstetrics and Gynecology) Harvey Seltzer, MD as Consulting Physician (Sports Medicine) Yvone Rush, MD as Consulting Physician (Orthopedic Surgery) Gaston Hamilton, MD as Consulting Physician (Urology) Elisabeth Valli BIRCH, MD as Consulting Physician (Urology) Wertman, Sara E, PA-C (Neurology) Bond, Reyes Mcardle, MD as Referring Physician (Ophthalmology)  Extended Emergency Contact Information Primary Emergency Contact: [facility] [place] [facility] Phone: [phone] Mobile Phone: [phone] Relation: Sister Secondary Emergency Contact: Diehl,Carol Address: [address]          [place], [place] [address] [place] Home Phone: [phone] Mobile Phone: [phone] Relation: Sister  Code Status:  DNR Goals of care: Advanced Directive information    02/14/2024    3:51 PM  Advanced Directives  Does Patient Have a Medical Advance Directive? Yes  Type of Advance Directive Living will;Healthcare Power of Attorney  Does patient want to make changes to medical advance directive? No - Patient declined  Copy of Healthcare Power of Attorney in Chart? Yes - validated most recent copy scanned in chart (See row information)     Chief Complaint  Patient presents with   Routine Visit    HPI:  Pt is a 79 y.o. female seen today for medical management of chronic diseases.    Resides in the memory care unit at [facility].  MMSE 16/30   She was treated with levaquin  for cellulitis of the lower extremities due to the recurrent nature on 08/10/24.  The redness has improved but is still present. She is not having any weeping or drainage. The nurse  is applying leg wraps.  Her weight was trending down last week which is done monthly at 216 lbs.  It has been difficult to get her to do weights are compression hose due to behaviors associated with dementia.  She was seen by Dr Tasia and depakote  and ativan  were increased last week for behaviors. She can be resistant to care, easily angered, and paranoid.    Gait is slower declining cognitively.   Currently on torsemide  daily  Recently treated for an abscess to the left axilla with 14 days of doxycycline  During the time of treatment the nurse reports her legs were not erythematous and had improved but remained swollen. No they are significant worse.   2 D echo Nov 2025 Normal LV function EF 60-65% Mild hypertrophy of the left ventricle  Neg venous dopplers  Normal BNP  Chronic abd pain: neg CT scan, has seen GI No current complaints.   Anemia:   LDL 89  Hgb 10.9 was 14 a year ago. Heme negative x 3  Hx of TIA on ASA Past Medical History:  Diagnosis Date   Abdominal cramps 08/03/2022   Chronic abdominal cramps - periodic. Worse. Treat constipation.  (Pt saw Dr Rosalie, had an MRI).   Allergic rhinitis 08/03/2022   Chronic runny nose - Atrovent  did not work  Start Loratidine 10 mg/d   Arthritis    Atrophic vaginitis 04/29/2021   9/22  Dr Marget, dr Elisabeth  On estrocream vag 3/wk PV   Bilateral leg weakness 04/06/2013   Present since 2011-2012. She must push up from a chair   CAD (coronary artery disease) 04/29/2021   2021 CT coronary  calcium score of 302.   Cont w/Simvastatin  - d/c, ASA  Cont on Pravastatin    Cataract    Both eyes have had lens replacement.   Constipation 08/03/2022   Chronic abdominal cramps - periodic. Worse.  (Pt saw Dr Rosalie, had an MRI).   Diverticulosis of colon 10/02/2010   Dyslipidemia 02/20/2008   Simvastatin      NMR Lipoprofile 2005: LDL particle # 1776/ small dense 1106 on Lipitor 20 mg daily.   MGF MI in 40s; father MI @ 79.  No FH CVA  NMR  05/2011:LDL 78 (1169/542), HDL 41, TG 135. LDL goal < 100, ideally < 70.     [facility] panel: Hyperabsorber of  dietary cholesterol; Lp(a) 112; CRP 2.7     12/19 cardiac CT scan for calcium scoring offered     Cont on Pravastatin       Dysrhythmia    palpitations recently   Elevated blood pressure reading without diagnosis of hypertension 04/10/2010   Generalized anxiety disorder 07/29/2021   12/22  Discussed. Pt declined meds, declined ref to psychology, psychiatry... Try Valerian root for anxiety   Glaucoma 03/17/2011   Groin pain 03/26/2019   8/20 R - ?MSK vs other  R/o hernia  Gen surg ref vs CT discussed  06/2019 CT ok - Dr Rosalie  MSK strain   Heart murmur    History of colonic polyps 03/25/2009       Colonoscopy 2009: Adenomatous polyps, Dr Rosalie.   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   Pigmentary glaucoma of both eyes, mild stage 04/09/2013   Plantar fasciitis, left 06/18/2009   Polycythemia, secondary 04/12/2016   Mild 2016   Pre-diabetes    per patient   Pseudophakia 06/23/2012   TIA (transient ischemic attack) 07/08/2019   12/20 probable-global aphasia, confusion  global aphasia, confusion - no relapse  Doppler (-) B  Card ECHO ordered   Urinary urgency 10/02/2010   Chronic  Dr Marget, Dr McDiarmid d/c. Seeing Dr Elisabeth  ?OAB  Trial of prn Ditropan  2022  Ditropan  po  Try estrocream vag 3/wk PV  On Macrobid qd  10/22 Memory loss is worse.  Likely aggravated by daily use of Ditropan .  She can switch to as needed use.  12/22 on  Estrocream and Myrbetriq   Discussed anxiety issues. Pt declined meds, declined ref to psychology, psychiatry...  Try Valerian root for anxie   Varicose veins of lower extremities 07/13/2010   Past Surgical History:  Procedure Laterality Date   ABDOMINAL HYSTERECTOMY     ANTERIOR AND POSTERIOR REPAIR WITH SACROSPINOUS FIXATION N/A 06/05/2015   Procedure: ANTERIOR AND POSTERIOR REPAIR WITH SACROSPINOUS LIGAMENT SUSPENSION;  Surgeon: Alm Marget, MD;  Location: [facility];  Service: Gynecology;  Laterality: N/A;   CATARACT EXTRACTION, BILATERAL     lens implant   CHOLECYSTECTOMY  1991   CHOLECYSTECTOMY, LAPAROSCOPIC     Dr Merrilyn   COLONOSCOPY  2015  neg; due 2020   colonoscopy with polypectomy     Dr Rosalie; X 3   EYE SURGERY     trabeculectomy ;Dr Marci.Cataract removal OD; Dr Eliza   TOTAL HIP ARTHROPLASTY Right 09/22/2018   Procedure: TOTAL HIP ARTHROPLASTY ANTERIOR APPROACH;  Surgeon: Yvone Rush, MD;  Location: [facility];  Service: Orthopedics;  Laterality: Right;    Allergies[1]  Outpatient Encounter Medications as of 08/20/2024  Medication Sig   acetaminophen  (TYLENOL ) 325 MG tablet Take 650 mg by mouth every morning.   acetaminophen  (TYLENOL ) 500 MG tablet Take 1,000 mg by mouth every 12 (twelve) hours as needed.   aspirin  81 MG EC tablet Take 81 mg by mouth daily. Swallow whole.   brimonidine-timolol  (COMBIGAN) 0.2-0.5 % ophthalmic solution Place 1 drop into the left eye 2 (two) times daily.   cetirizine (ZYRTEC) 10 MG tablet Take 10 mg by mouth daily.   dextromethorphan-guaiFENesin (ROBITUSSIN-DM) 10-100 MG/5ML liquid Take 10 mLs by mouth every 6 (six) hours as needed for cough.   diclofenac Sodium (VOLTAREN) 1 % GEL Apply topically every 8 (eight) hours as needed.   dicyclomine  (BENTYL ) 10 MG capsule Take 1 capsule (10 mg total) by mouth every morning.   divalproex  (DEPAKOTE ) 250 MG DR tablet Take 500 mg by mouth 2 (two) times daily.   doxepin (SINEQUAN) 10 MG capsule Take 25  mg by mouth at bedtime.  Give 1 capsule by mouth at bedtime for insomnia   Emollient (EUCERIN PLUS) 2.5-10 % CREA Apply topically. Apply to BLE topically every day and evening shift for Dry skin   ibuprofen  (ADVIL ) 200 MG tablet Take 200 mg by mouth every 6 (six) hours as needed.   latanoprost (XALATAN) 0.005 % ophthalmic solution Place 1 drop into the left eye at bedtime.   levofloxacin  (LEVAQUIN ) 500 MG tablet Take 1 tablet (500 mg total) by mouth daily for 10 days.   LORazepam  (ATIVAN ) 0.5 MG tablet Take 1 tablet (0.5 mg total) by mouth 3 (three) times daily as needed for anxiety. Please provide transdermal ativan  gel. Rub Ativan  0.5 mg in gel formulation TID as needed for agitation   LORazepam  (ATIVAN ) 1 MG tablet Apply topically to inner forearm twice weekly on shower days and q 8 prn agitation.   nystatin  (MYCOSTATIN /NYSTOP ) powder Apply topically 2 (two) times daily. Apply to gluteal folds topically two times a day for Moisture/Yeast   Polyethyl Glycol-Propyl Glycol 0.4-0.3 % SOLN Place 1-2 drops into both eyes daily as needed (for dry eyes).   polyethylene glycol (MIRALAX  / GLYCOLAX ) 17 g packet Take 17 g by mouth as needed.   potassium chloride  SA (KLOR-CON  M) 20 MEQ tablet Take 1 tablet (20 mEq total) by mouth daily.   pravastatin  (PRAVACHOL ) 10 MG tablet TAKE 1 TABLET BY MOUTH DAILY   risperiDONE  (RISPERDAL ) 0.5 MG tablet Take 0.5 mg by mouth every other day. Given 0.5 mg Monday, Wed, Thursday, Saturday, Sunday   risperiDONE  (RISPERDAL ) 1 MG tablet Take 1 mg by mouth at bedtime.   risperiDONE  (RISPERDAL ) 1 MG tablet Take 1 tablet (1 mg total) by mouth 2 (two) times a week. On Tuesday and  Friday   rivastigmine  (EXELON ) 3 MG capsule Take 4.5 mg by mouth 2 (two) times daily.   saccharomyces boulardii (FLORASTOR) 250 MG capsule Take 1 capsule (250 mg total) by mouth 2 (two) times daily for 10 days.   senna (SENOKOT) 8.6 MG TABS tablet Take 2 tablets by mouth as needed for mild  constipation.  sertraline  (ZOLOFT ) 50 MG tablet Take 75 mg by mouth daily.   Torsemide  40 MG TABS Take 40 mg by mouth daily.   traMADol  (ULTRAM ) 50 MG tablet Take by mouth every 8 (eight) hours as needed.   traMADol  (ULTRAM ) 50 MG tablet Take 1 tablet (50 mg total) by mouth at bedtime.   pantoprazole  (PROTONIX ) 40 MG tablet Take 1 tablet (40 mg total) by mouth daily. (Patient not taking: Reported on 08/20/2024)   No facility-administered encounter medications on file as of 08/20/2024.    Review of Systems  Immunization History  Administered Date(s) Administered    sv, Bivalent, Protein Subunit Rsvpref,pf (Abrysvo) 08/11/2022   Fluad  Quad(high Dose 65+) 03/26/2019, 04/23/2020, 04/29/2021, 05/03/2022, 05/10/2023   Fluad  Trivalent(High Dose 65+) 05/05/2023, 06/07/2024   INFLUENZA, HIGH DOSE SEASONAL PF 04/11/2014, 04/12/2016, 04/13/2017   Influenza Whole 05/02/2012, 05/02/2013   Influenza-Unspecified 04/27/2015, 05/12/2018   Moderna Covid-19 Vaccine  Bivalent Booster 63yrs & up 06/07/2024   Moderna SARS-COV2 Booster Vaccination 06/17/2020, 12/18/2020   Moderna Sars-Covid-2 Vaccination 08/14/2019, 09/12/2019, 05/15/2021   Pneumococcal Conjugate-13 04/11/2015   Pneumococcal Polysaccharide-23 03/17/2011   Td 02/20/2008   Tdap 06/26/2019   Unspecified SARS-COV-2 Vaccination 08/14/2019, 09/12/2019, 06/17/2020, 12/18/2020, 06/02/2022, 11/25/2022, 05/05/2023   Zoster Recombinant(Shingrix) 09/08/2017, 11/24/2017   Zoster, Live 08/02/2010   Pertinent  Health Maintenance Due  Topic Date Due   Influenza Vaccine  Completed   Bone Density Scan  Completed   Mammogram  Discontinued   Colonoscopy  Discontinued      02/27/2024    3:05 PM 04/03/2024    6:54 PM 04/24/2024    2:52 PM 06/12/2024    3:15 PM 07/17/2024    4:29 PM  Fall Risk  Falls in the past year? 0 0 0 1 1  Was there an injury with Fall? 0  0  0  1  1  Fall Risk Category Calculator 0 0 0 2 2  Patient at Risk for Falls Due to   History of fall(s) History of fall(s) History of fall(s);Impaired balance/gait History of fall(s);Impaired balance/gait  Fall risk Follow up Falls evaluation completed Falls evaluation completed Falls evaluation completed Falls evaluation completed Falls evaluation completed     Data saved with a previous flowsheet row definition   Functional Status Survey:    Vitals:   08/20/24 1128  BP: 126/66  Pulse: 78  Resp: (!) 22  Temp: (!) 97.2 F (36.2 C)  SpO2: 98%  Weight: 216 lb (98 kg)  Height: 6' 2 (1.88 m)   Body mass index is 27.73 kg/m. Physical Exam  Labs reviewed: Recent Labs    10/04/23 1612 01/26/24 0000 05/07/24 0000 05/09/24 0000 08/13/24 0000  NA 142   < > 143 141 145  K 4.4   < > 4.1 4.3 3.6  CL 106   < > 106 105 105  CO2 30   < > 25* 26* 29*  GLUCOSE 98  --   --   --   --   BUN 25*   < > 26* 25* 15  CREATININE 0.81   < > 0.8 0.8 0.9  CALCIUM 10.0   < > 8.9 8.9 8.9   < > = values in this interval not displayed.   Recent Labs    10/04/23 1612 01/26/24 0000 04/03/24 0000 05/07/24 0000 08/13/24 0000  AST 15   < > 21 18 14   ALT 11   < > 14 14 10   ALKPHOS 53   < > 63 62  61  BILITOT 0.6  --   --   --   --   PROT 7.3  --   --   --   --   ALBUMIN 4.3   < > 4.0 3.5 3.2*   < > = values in this interval not displayed.   Recent Labs    10/04/23 1612 01/26/24 0000 06/26/24 0000 08/13/24 0000  WBC 7.9 6.4 7.8 5.1  NEUTROABS 4.6  --   --   --   HGB 14.9 14.1 10.9* 10.5*  HCT 44.5 43 32* 31*  MCV 91.7  --   --   --   PLT 262.0 262 255 180   Lab Results  Component Value Date   TSH 4.87 08/13/2024   Lab Results  Component Value Date   HGBA1C 5.9 10/04/2023   Lab Results  Component Value Date   CHOL 152 08/13/2024   HDL 47 08/13/2024   LDLCALC 89 08/13/2024   LDLDIRECT 85.6 02/20/2008   TRIG 78 08/13/2024   CHOLHDL 3 01/28/2022    Significant Diagnostic Results in last 30 days:  No results found.  Assessment/Plan  1. Cellulitis of  lower extremity, unspecified laterality (Primary) Improved on levaquin  with less redness however at she may be developing a chronic erythema to her legs with venous stasis and would not initiate another antibiotic without other evidence or acute change.   2. Bilateral edema of lower extremity Compression wraps On torsemide  Need more data for her weight Weigh weekly ,consider torsemide  increase  3. Pigmentary glaucoma of both eyes, mild stage Followed by ophthalmology   4. Dementia with behavioral disturbance (HCC) Progressive decline in cognition and physical function c/w the disease. Followed by geropsych on a complex drug regimen.    5. Dyslipidemia LDL ok  Continue pravachol   6. Anemia of chronic disease Continue to montior.   7. Hx of TIA On asa and statin.          [1]  Allergies Allergen Reactions   Donepezil      diarrhea   Namenda  [Memantine ]     headache   Latex Rash    Red rash around red bandaid   "

## 2024-08-31 ENCOUNTER — Other Ambulatory Visit: Payer: Self-pay | Admitting: Adult Health

## 2024-08-31 MED ORDER — TRAMADOL HCL 50 MG PO TABS: 50.0000 mg | ORAL_TABLET | Freq: Every day | ORAL | 1 refills | Status: AC

## 2024-09-18 ENCOUNTER — Ambulatory Visit: Payer: Medicare Other
